# Patient Record
Sex: Male | Born: 1945
Health system: Southern US, Community
[De-identification: ages and names within clinical notes are randomized; demographics above are authoritative.]

## PROBLEM LIST (undated history)

## (undated) DIAGNOSIS — R112 Nausea with vomiting, unspecified: Secondary | ICD-10-CM

## (undated) DIAGNOSIS — Z8489 Family history of other specified conditions: Secondary | ICD-10-CM

## (undated) DIAGNOSIS — I10 Essential (primary) hypertension: Secondary | ICD-10-CM

## (undated) DIAGNOSIS — E785 Hyperlipidemia, unspecified: Secondary | ICD-10-CM

## (undated) DIAGNOSIS — C801 Malignant (primary) neoplasm, unspecified: Secondary | ICD-10-CM

## (undated) DIAGNOSIS — Z9889 Other specified postprocedural states: Secondary | ICD-10-CM

## (undated) DIAGNOSIS — T8859XA Other complications of anesthesia, initial encounter: Secondary | ICD-10-CM

## (undated) DIAGNOSIS — J45909 Unspecified asthma, uncomplicated: Secondary | ICD-10-CM

## (undated) DIAGNOSIS — T4145XA Adverse effect of unspecified anesthetic, initial encounter: Secondary | ICD-10-CM

## (undated) DIAGNOSIS — D649 Anemia, unspecified: Secondary | ICD-10-CM

## (undated) HISTORY — DX: Essential (primary) hypertension: I10

---
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
## 2007-05-05 ENCOUNTER — Ambulatory Visit: Payer: Self-pay | Admitting: General Surgery

## 2009-06-29 HISTORY — PX: APPENDECTOMY: SHX54

## 2009-10-02 ENCOUNTER — Observation Stay: Payer: Self-pay | Admitting: General Surgery

## 2009-10-02 ENCOUNTER — Ambulatory Visit: Payer: Self-pay | Admitting: Internal Medicine

## 2012-11-17 ENCOUNTER — Ambulatory Visit: Payer: Self-pay | Admitting: Internal Medicine

## 2013-04-24 ENCOUNTER — Encounter: Payer: Self-pay | Admitting: *Deleted

## 2013-04-25 ENCOUNTER — Encounter: Payer: Self-pay | Admitting: Podiatry

## 2013-04-25 ENCOUNTER — Ambulatory Visit (INDEPENDENT_AMBULATORY_CARE_PROVIDER_SITE_OTHER): Payer: Medicare Other | Admitting: Podiatry

## 2013-04-25 ENCOUNTER — Ambulatory Visit (INDEPENDENT_AMBULATORY_CARE_PROVIDER_SITE_OTHER): Payer: Medicare Other

## 2013-04-25 VITALS — BP 118/65 | HR 74 | Resp 16 | Ht 75.0 in | Wt 226.0 lb

## 2013-04-25 DIAGNOSIS — M79609 Pain in unspecified limb: Secondary | ICD-10-CM

## 2013-04-25 DIAGNOSIS — M2040 Other hammer toe(s) (acquired), unspecified foot: Secondary | ICD-10-CM

## 2013-04-25 DIAGNOSIS — L84 Corns and callosities: Secondary | ICD-10-CM

## 2013-04-25 DIAGNOSIS — M79672 Pain in left foot: Secondary | ICD-10-CM

## 2013-04-25 NOTE — Progress Notes (Signed)
Subjective:     Patient ID: Glenn Watson, male   DOB: [birth year], 67 y.o.   MRN: [id number]  Toe Pain     Patient presents stating my left second toe at the tip has been very sore and making it hard for me to wear shoes or walk. States has been present for several months and does not remember specific injury   Review of Systems  All other systems reviewed and are negative.       Objective:   Physical Exam  Nursing note and vitals reviewed. Constitutional: He appears well-developed and well-nourished.  Cardiovascular: Intact distal pulses.   Neurological: He is alert.  Skin: Skin is warm.   patient structural has an elongated second toe left with irritation and corn callus formation at the distal lateral tip    Assessment:     Distal keratotic lesion secondary to structural abnormality of the toe    Plan:     H&P and x-ray reviewed with patient. Debridement of lesion accomplished with Band-Aid and buttress pad to reduce pressure against the second toe advised the toe may need to be shortened depending on when symptoms return

## 2013-11-14 DIAGNOSIS — D649 Anemia, unspecified: Secondary | ICD-10-CM | POA: Insufficient documentation

## 2013-11-14 DIAGNOSIS — M1990 Unspecified osteoarthritis, unspecified site: Secondary | ICD-10-CM | POA: Insufficient documentation

## 2015-10-03 DIAGNOSIS — H40003 Preglaucoma, unspecified, bilateral: Secondary | ICD-10-CM | POA: Diagnosis not present

## 2015-10-17 DIAGNOSIS — D485 Neoplasm of uncertain behavior of skin: Secondary | ICD-10-CM | POA: Diagnosis not present

## 2015-10-17 DIAGNOSIS — D045 Carcinoma in situ of skin of trunk: Secondary | ICD-10-CM | POA: Diagnosis not present

## 2015-10-17 DIAGNOSIS — Z85828 Personal history of other malignant neoplasm of skin: Secondary | ICD-10-CM | POA: Diagnosis not present

## 2015-10-17 DIAGNOSIS — C44311 Basal cell carcinoma of skin of nose: Secondary | ICD-10-CM | POA: Diagnosis not present

## 2015-10-17 DIAGNOSIS — Z08 Encounter for follow-up examination after completed treatment for malignant neoplasm: Secondary | ICD-10-CM | POA: Diagnosis not present

## 2015-10-17 DIAGNOSIS — X32XXXA Exposure to sunlight, initial encounter: Secondary | ICD-10-CM | POA: Diagnosis not present

## 2015-10-17 DIAGNOSIS — C44719 Basal cell carcinoma of skin of left lower limb, including hip: Secondary | ICD-10-CM | POA: Diagnosis not present

## 2015-10-17 DIAGNOSIS — L570 Actinic keratosis: Secondary | ICD-10-CM | POA: Diagnosis not present

## 2015-11-14 DIAGNOSIS — E7800 Pure hypercholesterolemia, unspecified: Secondary | ICD-10-CM | POA: Diagnosis not present

## 2015-11-14 DIAGNOSIS — I10 Essential (primary) hypertension: Secondary | ICD-10-CM | POA: Diagnosis not present

## 2015-11-14 DIAGNOSIS — Z125 Encounter for screening for malignant neoplasm of prostate: Secondary | ICD-10-CM | POA: Diagnosis not present

## 2015-11-14 DIAGNOSIS — Z79899 Other long term (current) drug therapy: Secondary | ICD-10-CM | POA: Diagnosis not present

## 2015-11-21 DIAGNOSIS — I10 Essential (primary) hypertension: Secondary | ICD-10-CM | POA: Diagnosis not present

## 2015-11-21 DIAGNOSIS — M1990 Unspecified osteoarthritis, unspecified site: Secondary | ICD-10-CM | POA: Diagnosis not present

## 2015-11-21 DIAGNOSIS — Z125 Encounter for screening for malignant neoplasm of prostate: Secondary | ICD-10-CM | POA: Diagnosis not present

## 2015-11-21 DIAGNOSIS — Z79899 Other long term (current) drug therapy: Secondary | ICD-10-CM | POA: Diagnosis not present

## 2015-11-21 DIAGNOSIS — E7800 Pure hypercholesterolemia, unspecified: Secondary | ICD-10-CM | POA: Diagnosis not present

## 2015-11-21 DIAGNOSIS — Z0000 Encounter for general adult medical examination without abnormal findings: Secondary | ICD-10-CM | POA: Diagnosis not present

## 2015-11-21 DIAGNOSIS — D649 Anemia, unspecified: Secondary | ICD-10-CM | POA: Diagnosis not present

## 2015-11-28 DIAGNOSIS — Z85828 Personal history of other malignant neoplasm of skin: Secondary | ICD-10-CM | POA: Diagnosis not present

## 2015-11-28 DIAGNOSIS — C44311 Basal cell carcinoma of skin of nose: Secondary | ICD-10-CM | POA: Diagnosis not present

## 2015-11-29 DIAGNOSIS — L905 Scar conditions and fibrosis of skin: Secondary | ICD-10-CM | POA: Diagnosis not present

## 2015-11-29 DIAGNOSIS — C44719 Basal cell carcinoma of skin of left lower limb, including hip: Secondary | ICD-10-CM | POA: Diagnosis not present

## 2015-12-13 DIAGNOSIS — D044 Carcinoma in situ of skin of scalp and neck: Secondary | ICD-10-CM | POA: Diagnosis not present

## 2016-03-26 DIAGNOSIS — Z125 Encounter for screening for malignant neoplasm of prostate: Secondary | ICD-10-CM | POA: Diagnosis not present

## 2016-03-26 DIAGNOSIS — Z79899 Other long term (current) drug therapy: Secondary | ICD-10-CM | POA: Diagnosis not present

## 2016-04-22 DIAGNOSIS — D485 Neoplasm of uncertain behavior of skin: Secondary | ICD-10-CM | POA: Diagnosis not present

## 2016-04-22 DIAGNOSIS — D2339 Other benign neoplasm of skin of other parts of face: Secondary | ICD-10-CM | POA: Diagnosis not present

## 2016-04-22 DIAGNOSIS — C44729 Squamous cell carcinoma of skin of left lower limb, including hip: Secondary | ICD-10-CM | POA: Diagnosis not present

## 2016-04-22 DIAGNOSIS — Z85828 Personal history of other malignant neoplasm of skin: Secondary | ICD-10-CM | POA: Diagnosis not present

## 2016-04-24 ENCOUNTER — Other Ambulatory Visit: Payer: Self-pay | Admitting: Internal Medicine

## 2016-04-24 DIAGNOSIS — M1732 Unilateral post-traumatic osteoarthritis, left knee: Secondary | ICD-10-CM | POA: Diagnosis not present

## 2016-04-24 DIAGNOSIS — M79605 Pain in left leg: Secondary | ICD-10-CM | POA: Diagnosis not present

## 2016-05-18 ENCOUNTER — Other Ambulatory Visit: Payer: Self-pay | Admitting: Internal Medicine

## 2016-05-18 DIAGNOSIS — M1732 Unilateral post-traumatic osteoarthritis, left knee: Secondary | ICD-10-CM

## 2016-05-18 DIAGNOSIS — M79605 Pain in left leg: Secondary | ICD-10-CM

## 2016-05-19 ENCOUNTER — Ambulatory Visit
Admission: RE | Admit: 2016-05-19 | Discharge: 2016-05-19 | Disposition: A | Discharge status: Home / Self Care | Payer: PPO | Source: Ambulatory Visit | Attending: Internal Medicine | Admitting: Internal Medicine

## 2016-05-19 DIAGNOSIS — M948X6 Other specified disorders of cartilage, lower leg: Secondary | ICD-10-CM | POA: Diagnosis not present

## 2016-05-19 DIAGNOSIS — M79605 Pain in left leg: Secondary | ICD-10-CM | POA: Diagnosis not present

## 2016-05-19 DIAGNOSIS — R937 Abnormal findings on diagnostic imaging of other parts of musculoskeletal system: Secondary | ICD-10-CM | POA: Insufficient documentation

## 2016-05-19 DIAGNOSIS — M1732 Unilateral post-traumatic osteoarthritis, left knee: Secondary | ICD-10-CM | POA: Insufficient documentation

## 2016-05-19 DIAGNOSIS — M25562 Pain in left knee: Secondary | ICD-10-CM | POA: Diagnosis not present

## 2016-05-29 DIAGNOSIS — C44729 Squamous cell carcinoma of skin of left lower limb, including hip: Secondary | ICD-10-CM | POA: Diagnosis not present

## 2016-07-07 DIAGNOSIS — G5732 Lesion of lateral popliteal nerve, left lower limb: Secondary | ICD-10-CM | POA: Diagnosis not present

## 2016-07-07 DIAGNOSIS — M2392 Unspecified internal derangement of left knee: Secondary | ICD-10-CM | POA: Diagnosis not present

## 2016-07-07 DIAGNOSIS — M1712 Unilateral primary osteoarthritis, left knee: Secondary | ICD-10-CM | POA: Diagnosis not present

## 2016-11-27 DIAGNOSIS — Z79899 Other long term (current) drug therapy: Secondary | ICD-10-CM | POA: Diagnosis not present

## 2016-11-27 DIAGNOSIS — E7800 Pure hypercholesterolemia, unspecified: Secondary | ICD-10-CM | POA: Diagnosis not present

## 2016-11-27 DIAGNOSIS — Z125 Encounter for screening for malignant neoplasm of prostate: Secondary | ICD-10-CM | POA: Diagnosis not present

## 2016-12-03 DIAGNOSIS — I10 Essential (primary) hypertension: Secondary | ICD-10-CM | POA: Diagnosis not present

## 2016-12-03 DIAGNOSIS — D649 Anemia, unspecified: Secondary | ICD-10-CM | POA: Diagnosis not present

## 2016-12-03 DIAGNOSIS — E7800 Pure hypercholesterolemia, unspecified: Secondary | ICD-10-CM | POA: Diagnosis not present

## 2016-12-03 DIAGNOSIS — Z1211 Encounter for screening for malignant neoplasm of colon: Secondary | ICD-10-CM | POA: Diagnosis not present

## 2016-12-03 DIAGNOSIS — R42 Dizziness and giddiness: Secondary | ICD-10-CM | POA: Diagnosis not present

## 2016-12-03 DIAGNOSIS — Z79899 Other long term (current) drug therapy: Secondary | ICD-10-CM | POA: Diagnosis not present

## 2016-12-03 DIAGNOSIS — Z125 Encounter for screening for malignant neoplasm of prostate: Secondary | ICD-10-CM | POA: Diagnosis not present

## 2016-12-03 DIAGNOSIS — Z0000 Encounter for general adult medical examination without abnormal findings: Secondary | ICD-10-CM | POA: Diagnosis not present

## 2016-12-07 ENCOUNTER — Encounter: Payer: Self-pay | Admitting: *Deleted

## 2016-12-14 ENCOUNTER — Encounter: Payer: Self-pay | Admitting: General Surgery

## 2016-12-14 ENCOUNTER — Telehealth: Payer: Self-pay | Admitting: *Deleted

## 2016-12-14 ENCOUNTER — Ambulatory Visit (INDEPENDENT_AMBULATORY_CARE_PROVIDER_SITE_OTHER): Payer: PPO | Admitting: General Surgery

## 2016-12-14 VITALS — BP 124/70 | HR 67 | Resp 13 | Ht 75.0 in | Wt 230.0 lb

## 2016-12-14 DIAGNOSIS — Z1211 Encounter for screening for malignant neoplasm of colon: Secondary | ICD-10-CM

## 2016-12-14 MED ORDER — POLYETHYLENE GLYCOL 3350 17 GM/SCOOP PO POWD: ORAL | 0 refills | Status: DC

## 2016-12-14 NOTE — Patient Instructions (Signed)
Colonoscopy, Adult A colonoscopy is an exam to look at the entire large intestine. During the exam, a lubricated, bendable tube is inserted into the anus and then passed into the rectum, colon, and other parts of the large intestine. A colonoscopy is often done as a part of normal colorectal screening or in response to certain symptoms, such as anemia, persistent diarrhea, abdominal pain, and blood in the stool. The exam can help screen for and diagnose medical problems, including:  Tumors.  Polyps.  Inflammation.  Areas of bleeding.  Tell a health care provider about:  Any allergies you have.  All medicines you are taking, including vitamins, herbs, eye drops, creams, and over-the-counter medicines.  Any problems you or family members have had with anesthetic medicines.  Any blood disorders you have.  Any surgeries you have had.  Any medical conditions you have.  Any problems you have had passing stool. What are the risks? Generally, this is a safe procedure. However, problems may occur, including:  Bleeding.  A tear in the intestine.  A reaction to medicines given during the exam.  Infection (rare).  What happens before the procedure? Eating and drinking restrictions Follow instructions from your health care provider about eating and drinking, which may include:  A few days before the procedure - follow a low-fiber diet. Avoid nuts, seeds, dried fruit, raw fruits, and vegetables.  1-3 days before the procedure - follow a clear liquid diet. Drink only clear liquids, such as clear broth or bouillon, black coffee or tea, clear juice, clear soft drinks or sports drinks, gelatin dessert, and popsicles. Avoid any liquids that contain red or purple dye.  On the day of the procedure - do not eat or drink anything during the 2 hours before the procedure, or within the time period that your health care provider recommends.  Bowel prep If you were prescribed an oral bowel prep  to clean out your colon:  Take it as told by your health care provider. Starting the day before your procedure, you will need to drink a large amount of medicated liquid. The liquid will cause you to have multiple loose stools until your stool is almost clear or light green.  If your skin or anus gets irritated from diarrhea, you may use these to relieve the irritation: ? Medicated wipes, such as adult wet wipes with aloe and vitamin E. ? A skin soothing-product like petroleum jelly.  If you vomit while drinking the bowel prep, take a break for up to 60 minutes and then begin the bowel prep again. If vomiting continues and you cannot take the bowel prep without vomiting, call your health care provider.  General instructions  Ask your health care provider about changing or stopping your regular medicines. This is especially important if you are taking diabetes medicines or blood thinners.  Plan to have someone take you home from the hospital or clinic. What happens during the procedure?  An IV tube may be inserted into one of your veins.  You will be given medicine to help you relax (sedative).  To reduce your risk of infection: ? Your health care team will wash or sanitize their hands. ? Your anal area will be washed with soap.  You will be asked to lie on your side with your knees bent.  Your health care provider will lubricate a long, thin, flexible tube. The tube will have a camera and a light on the end.  The tube will be inserted into your   anus.  The tube will be gently eased through your rectum and colon.  Air will be delivered into your colon to keep it open. You may feel some pressure or cramping.  The camera will be used to take images during the procedure.  A small tissue sample may be removed from your body to be examined under a microscope (biopsy). If any potential problems are found, the tissue will be sent to a lab for testing.  If small polyps are found, your  health care provider may remove them and have them checked for cancer cells.  The tube that was inserted into your anus will be slowly removed. The procedure may vary among health care providers and hospitals. What happens after the procedure?  Your blood pressure, heart rate, breathing rate, and blood oxygen level will be monitored until the medicines you were given have worn off.  Do not drive for 24 hours after the exam.  You may have a small amount of blood in your stool.  You may pass gas and have mild abdominal cramping or bloating due to the air that was used to inflate your colon during the exam.  It is up to you to get the results of your procedure. Ask your health care provider, or the department performing the procedure, when your results will be ready. This information is not intended to replace advice given to you by your health care provider. Make sure you discuss any questions you have with your health care provider. Document Released: 06/12/2000 Document Revised: 04/15/2016 Document Reviewed: 08/27/2015 Elsevier Interactive Patient Education  2018 [employer]  

## 2016-12-14 NOTE — Telephone Encounter (Signed)
Patient called the office back and wants to schedule colonoscopy for 02-10-17 at [facility] with Dr. Jamal Collin.   This patient is aware to call the office if they have further questions.

## 2016-12-14 NOTE — Progress Notes (Signed)
Patient ID: Glenn Watson, male   DOB: [birth year], 71 y.o.   MRN: [id number]  Chief Complaint  Patient presents with  . Colonoscopy    HPI Glenn Watson is a 71 y.o. male is here today for an evaluation of a colonoscopy. Last one was done in 2008. Patient states no gastrointestinal issues. Moves bowels daily. HPI  Past Medical History:  Diagnosis Date  . Hypertension     Past Surgical History:  Procedure Laterality Date  . APPENDECTOMY  2011   Dr. Bary Castilla    No family history on file.  Social History Social History  Substance Use Topics  . Smoking status: Never Smoker  . Smokeless tobacco: Never Used  . Alcohol use Yes     Comment: BEER EACH DAY    No Known Allergies  Current Outpatient Prescriptions  Medication Sig Dispense Refill  . amLODipine-benazepril (LOTREL) 5-10 MG per capsule Take 1 capsule by mouth daily.    Marland Kitchen aspirin 81 MG tablet Take 81 mg by mouth daily.    . Calcium Carb-Cholecalciferol (CALCIUM 600 + D PO) Take by mouth daily.    . Cholecalciferol (VITAMIN D) 2000 UNITS CAPS Take by mouth daily.    . Ferrous Sulfate (IRON) 325 (65 FE) MG TABS Take by mouth daily.    . fish oil-omega-3 fatty acids 1000 MG capsule Take 2 g by mouth daily.    . meloxicam (MOBIC) 15 MG tablet Take 15 mg by mouth daily.    . Multiple Vitamin (MULTIVITAMIN) tablet Take 1 tablet by mouth daily.    . polyethylene glycol powder (GLYCOLAX/MIRALAX) powder 255 grams one bottle for colonoscopy prep 255 g 0   No current facility-administered medications for this visit.     Review of Systems Review of Systems  Constitutional: Negative.   Respiratory: Negative.   Cardiovascular: Negative.   Gastrointestinal: Negative.     Blood pressure 124/70, pulse 67, resp. rate 13, height 6\' 3"  (1.905 m), weight 230 lb (104.3 kg).  Physical Exam Physical Exam  Constitutional: He is oriented to person, place, and time. He appears well-developed and well-nourished.  Eyes:  Conjunctivae are normal. No scleral icterus.  Neck: Neck supple.  Cardiovascular: Normal rate, regular rhythm and normal heart sounds.   Pulmonary/Chest: Effort normal and breath sounds normal.  Abdominal: Soft. Normal appearance and bowel sounds are normal.  Lymphadenopathy:    He has no cervical adenopathy.  Neurological: He is alert and oriented to person, place, and time.  Skin: Skin is warm and dry.  Psychiatric: He has a normal mood and affect. His behavior is normal.    Data Reviewed Prior notes reviewed.  Assessment    Colonoscopy screening due. Left torn meniscus- takes Mobic once or twice a week, following with Dr. Greggory Keen exam otherwise    Plan    Colonoscopy to be scheduled in July, patient said he will figure out his schedule and call office back.     Colonoscopy with possible biopsy/polypectomy prn: Information regarding the procedure, including its potential risks and complications (including but not limited to perforation of the bowel, which may require emergency surgery to repair, and bleeding) was verbally given to the patient. Educational information regarding lower intestinal endoscopy was given to the patient. Written instructions for how to complete the bowel prep using Miralax were provided. The importance of drinking ample fluids to avoid dehydration as a result of the prep emphasized.  HPI, Physical Exam, Assessment and Plan have been scribed under the  direction and in the presence of Mckinley Jewel, MD.  Verlene Mayer, CMA I have completed the exam and reviewed the above documentation for accuracy and completeness.  I agree with the above.  Haematologist has been used and any errors in dictation or transcription are unintentional.  Seeplaputhur G. Jamal Collin, M.D., F.A.C.S.   Junie Panning G 12/14/2016, 11:00 AM   Miralax prescription has been sent in to the patient's pharmacy today. Colonoscopy instructions have been reviewed with the patient.  He wishes to check with wife regarding a date and will call the office back to arrange.   Dominga Ferry, CMA

## 2016-12-15 ENCOUNTER — Encounter: Payer: Self-pay | Admitting: General Surgery

## 2016-12-24 ENCOUNTER — Ambulatory Visit: Payer: Self-pay | Admitting: General Surgery

## 2017-01-11 DIAGNOSIS — I6523 Occlusion and stenosis of bilateral carotid arteries: Secondary | ICD-10-CM | POA: Diagnosis not present

## 2017-01-11 DIAGNOSIS — R42 Dizziness and giddiness: Secondary | ICD-10-CM | POA: Diagnosis not present

## 2017-02-02 ENCOUNTER — Other Ambulatory Visit: Payer: Self-pay | Admitting: General Surgery

## 2017-02-02 DIAGNOSIS — Z1211 Encounter for screening for malignant neoplasm of colon: Secondary | ICD-10-CM

## 2017-02-10 ENCOUNTER — Ambulatory Visit
Admission: RE | Admit: 2017-02-10 | Discharge: 2017-02-10 | Disposition: A | Discharge status: Home / Self Care | Payer: PPO | Source: Ambulatory Visit | Attending: General Surgery | Admitting: General Surgery

## 2017-02-10 ENCOUNTER — Encounter: Payer: Self-pay | Admitting: Anesthesiology

## 2017-02-10 ENCOUNTER — Ambulatory Visit: Payer: PPO | Admitting: Anesthesiology

## 2017-02-10 ENCOUNTER — Encounter
Admission: RE | Disposition: A | Discharge status: Home / Self Care | Payer: Self-pay | Source: Ambulatory Visit | Attending: General Surgery

## 2017-02-10 DIAGNOSIS — I10 Essential (primary) hypertension: Secondary | ICD-10-CM | POA: Diagnosis not present

## 2017-02-10 DIAGNOSIS — Z7982 Long term (current) use of aspirin: Secondary | ICD-10-CM | POA: Diagnosis not present

## 2017-02-10 DIAGNOSIS — K5730 Diverticulosis of large intestine without perforation or abscess without bleeding: Secondary | ICD-10-CM | POA: Insufficient documentation

## 2017-02-10 DIAGNOSIS — Z1211 Encounter for screening for malignant neoplasm of colon: Secondary | ICD-10-CM | POA: Diagnosis not present

## 2017-02-10 DIAGNOSIS — Z79899 Other long term (current) drug therapy: Secondary | ICD-10-CM | POA: Insufficient documentation

## 2017-02-10 DIAGNOSIS — K5790 Diverticulosis of intestine, part unspecified, without perforation or abscess without bleeding: Secondary | ICD-10-CM | POA: Diagnosis not present

## 2017-02-10 HISTORY — PX: COLONOSCOPY WITH PROPOFOL: SHX5780

## 2017-02-10 SURGERY — COLONOSCOPY WITH PROPOFOL
Anesthesia: General

## 2017-02-10 MED ORDER — MIDAZOLAM HCL 2 MG/2ML IJ SOLN
INTRAMUSCULAR | Status: AC
  Filled 2017-02-10: qty 2

## 2017-02-10 MED ORDER — FENTANYL CITRATE (PF) 100 MCG/2ML IJ SOLN
INTRAMUSCULAR | Status: AC
  Filled 2017-02-10: qty 2

## 2017-02-10 MED ORDER — PROPOFOL 10 MG/ML IV BOLUS
INTRAVENOUS | Status: AC
  Filled 2017-02-10: qty 20

## 2017-02-10 MED ORDER — MIDAZOLAM HCL 2 MG/2ML IJ SOLN
INTRAMUSCULAR | Status: DC | PRN
  Administered 2017-02-10: 2 mg via INTRAVENOUS

## 2017-02-10 MED ORDER — SODIUM CHLORIDE 0.9 % IV SOLN
INTRAVENOUS | Status: DC
  Administered 2017-02-10: 1000 mL via INTRAVENOUS

## 2017-02-10 MED ORDER — EPHEDRINE SULFATE 50 MG/ML IJ SOLN
INTRAMUSCULAR | Status: AC
  Filled 2017-02-10: qty 1

## 2017-02-10 MED ORDER — EPHEDRINE SULFATE 50 MG/ML IJ SOLN
INTRAMUSCULAR | Status: DC | PRN
  Administered 2017-02-10 (×2): 10 mg via INTRAVENOUS

## 2017-02-10 MED ORDER — PROPOFOL 500 MG/50ML IV EMUL
INTRAVENOUS | Status: AC
  Filled 2017-02-10: qty 50

## 2017-02-10 MED ORDER — PROPOFOL 500 MG/50ML IV EMUL
INTRAVENOUS | Status: DC | PRN
  Administered 2017-02-10: 120 ug/kg/min via INTRAVENOUS

## 2017-02-10 MED ORDER — FENTANYL CITRATE (PF) 100 MCG/2ML IJ SOLN
INTRAMUSCULAR | Status: DC | PRN
  Administered 2017-02-10: 50 ug via INTRAVENOUS
  Administered 2017-02-10 (×2): 25 ug via INTRAVENOUS

## 2017-02-10 NOTE — Interval H&P Note (Signed)
History and Physical Interval Note:  02/10/2017 7:53 AM  Glenn Watson  has presented today for surgery, with the diagnosis of SCREENING  The various methods of treatment have been discussed with the patient and family. After consideration of risks, benefits and other options for treatment, the patient has consented to  Procedure(s): COLONOSCOPY WITH PROPOFOL (N/A) as a surgical intervention .  The patient's history has been reviewed, patient examined, no change in status, stable for surgery.  I have reviewed the patient's chart and labs.  Questions were answered to the patient's satisfaction.     Demoni Parmar G

## 2017-02-10 NOTE — Anesthesia Preprocedure Evaluation (Signed)
Anesthesia Evaluation  Patient identified by MRN, date of birth, ID band Patient awake    Reviewed: Allergy & Precautions, NPO status , Patient's Chart, lab work & pertinent test results  History of Anesthesia Complications Negative for: history of anesthetic complications  Airway Mallampati: II       Dental   Pulmonary asthma (as a child) , neg COPD,           Cardiovascular hypertension, Pt. on medications (-) Past MI and (-) CHF (-) dysrhythmias (-) Valvular Problems/Murmurs     Neuro/Psych neg Seizures    GI/Hepatic Neg liver ROS, neg GERD  ,  Endo/Other  neg diabetes  Renal/GU negative Renal ROS     Musculoskeletal   Abdominal   Peds  Hematology   Anesthesia Other Findings   Reproductive/Obstetrics                             Anesthesia Physical Anesthesia Plan  ASA: II  Anesthesia Plan: General   Post-op Pain Management:    Induction: Intravenous  PONV Risk Score and Plan:   Airway Management Planned:   Additional Equipment:   Intra-op Plan:   Post-operative Plan:   Informed Consent: I have reviewed the patients History and Physical, chart, labs and discussed the procedure including the risks, benefits and alternatives for the proposed anesthesia with the patient or authorized representative who has indicated his/her understanding and acceptance.     Plan Discussed with:   Anesthesia Plan Comments:         Anesthesia Quick Evaluation

## 2017-02-10 NOTE — Anesthesia Procedure Notes (Signed)
Performed by: COOK-MARTIN, Savannha Welle Pre-anesthesia Checklist: Patient identified, Emergency Drugs available, Suction available, Patient being monitored and Timeout performed Patient Re-evaluated:Patient Re-evaluated prior to induction Oxygen Delivery Method: Nasal cannula Preoxygenation: Pre-oxygenation with 100% oxygen Induction Type: IV induction Placement Confirmation: positive ETCO2 and CO2 detector       

## 2017-02-10 NOTE — Transfer of Care (Signed)
Immediate Anesthesia Transfer of Care Note  Patient: Glenn Watson  Procedure(s) Performed: Procedure(s): COLONOSCOPY WITH PROPOFOL (N/A)  Patient Location: PACU  Anesthesia Type:General  Level of Consciousness: awake and sedated  Airway & Oxygen Therapy: Patient Spontanous Breathing and Patient connected to nasal cannula oxygen  Post-op Assessment: Report given to RN and Post -op Vital signs reviewed and stable  Post vital signs: Reviewed and stable  Last Vitals:  Vitals:   02/10/17 0729  BP: (!) 150/81  Pulse: 66  Resp: 18  Temp: 37.3 C  SpO2: 98%    Last Pain:  Vitals:   02/10/17 0729  TempSrc: Tympanic         Complications: No apparent anesthesia complications

## 2017-02-10 NOTE — Anesthesia Postprocedure Evaluation (Signed)
Anesthesia Post Note  Patient: Glenn Watson  Procedure(s) Performed: Procedure(s) (LRB): COLONOSCOPY WITH PROPOFOL (N/A)  Patient location during evaluation: Endoscopy Anesthesia Type: General Level of consciousness: awake and alert Pain management: pain level controlled Vital Signs Assessment: post-procedure vital signs reviewed and stable Respiratory status: spontaneous breathing and respiratory function stable Cardiovascular status: stable Anesthetic complications: no     Last Vitals:  Vitals:   02/10/17 0729 02/10/17 0850  BP: (!) 150/81 (!) 95/51  Pulse: 66 66  Resp: 18 18  Temp: 37.3 C (!) 35.8 C  SpO2: 98% 98%    Last Pain:  Vitals:   02/10/17 0850  TempSrc: Tympanic                 KEPHART,WILLIAM K

## 2017-02-10 NOTE — Op Note (Signed)
[facility] Gastroenterology Patient Name: Glenn Watson Procedure Date: 02/10/2017 7:11 AM MRN: [id number] Account #: [id number] Date of Birth: Mar 23, 1946 Admit Type: Outpatient Age: 71 Room: [facility] ENDO ROOM 1 Gender: Male Note Status: Finalized Procedure:            Colonoscopy Indications:          Screening for colorectal malignant neoplasm Providers:            Mitsuye Schrodt G. Jamal Collin, MD Referring MD:         Leonie Douglas. Doy Hutching, MD (Referring MD) Medicines:            Total IV Anesthesia (TIVA) Complications:        No immediate complications. Procedure:            Pre-Anesthesia Assessment:                       - General anesthesia under the supervision of an                        anesthesiologist was determined to be medically                        necessary for this procedure based on review of the                        patient's medical history, medications, and prior                        anesthesia history.                       After obtaining informed consent, the colonoscope was                        passed under direct vision. Throughout the procedure,                        the patient's blood pressure, pulse, and oxygen                        saturations were monitored continuously. The                        Colonoscope was introduced through the anus and                        advanced to the the cecum, identified by the ileocecal                        valve. The colonoscopy was performed without                        difficulty. The patient tolerated the procedure well.                        The quality of the bowel preparation was excellent. Findings:      The perianal and digital rectal examinations were normal.      A few small-mouthed diverticula were found in the sigmoid colon.      The exam was otherwise without abnormality. Impression:           -  Diverticulosis in the sigmoid colon.                       - The examination was  otherwise normal.                       - No specimens collected. Recommendation:       - Discharge patient to home.                       - Resume previous diet.                       - Continue present medications.                       - Repeat colonoscopy in 10 years for screening purposes. Procedure Code(s):    --- Professional ---                       [id number], Colonoscopy, flexible; diagnostic, including                        collection of specimen(s) by brushing or washing, when                        performed (separate procedure) Diagnosis Code(s):    --- Professional ---                       Z12.11, Encounter for screening for malignant neoplasm                        of colon                       K57.30, Diverticulosis of large intestine without                        perforation or abscess without bleeding CPT copyright 2016 American [employer]. All rights reserved. The codes documented in this report are preliminary and upon coder review may  be revised to meet current compliance requirements. Christene Lye, MD 02/10/2017 8:49:42 AM This report has been signed electronically. Number of Addenda: 0 Note Initiated On: 02/10/2017 7:11 AM Scope Withdrawal Time: 0 hours 3 minutes 37 seconds  Total Procedure Duration: 0 hours 46 minutes 13 seconds       [facility]

## 2017-02-10 NOTE — H&P (Signed)
Glenn Watson is an 71 y.o. male.   Chief Complaint:Here for colonoscopy HPI: 71 yr old male in good health here for screening colonoscopy. Last done 10 yrs ago-normal. No GI complaints  Past Medical History:  Diagnosis Date  . Hypertension     Past Surgical History:  Procedure Laterality Date  . APPENDECTOMY  2011   Dr. Bary Castilla    History reviewed. No pertinent family history. Social History:  reports that he has never smoked. He has never used smokeless tobacco. He reports that he drinks alcohol. He reports that he does not use drugs.  Allergies: No Known Allergies  Medications Prior to Admission  Medication Sig Dispense Refill  . amLODipine-benazepril (LOTREL) 5-10 MG per capsule Take 1 capsule by mouth daily.    Marland Kitchen aspirin 81 MG tablet Take 81 mg by mouth daily.    . Calcium Carb-Cholecalciferol (CALCIUM 600 + D PO) Take by mouth daily.    . Cholecalciferol (VITAMIN D) 2000 UNITS CAPS Take by mouth daily.    . Ferrous Sulfate (IRON) 325 (65 FE) MG TABS Take by mouth daily.    . fish oil-omega-3 fatty acids 1000 MG capsule Take 2 g by mouth daily.    . meloxicam (MOBIC) 15 MG tablet Take 15 mg by mouth daily.    . Multiple Vitamin (MULTIVITAMIN) tablet Take 1 tablet by mouth daily.    . polyethylene glycol powder (GLYCOLAX/MIRALAX) powder 255 grams one bottle for colonoscopy prep 255 g 0    No results found for this or any previous visit (from the past 2 hour(s)). No results found.  Review of Systems  Constitutional: Negative.   Respiratory: Negative.   Cardiovascular: Negative.   Gastrointestinal: Negative.   Genitourinary: Negative.     Blood pressure (!) 150/81, pulse 66, temperature 99.2 F (37.3 C), temperature source Tympanic, resp. rate 18, height 6\' 3"  (1.905 m), weight 229 lb (103.9 kg), SpO2 98 %. Physical Exam  Constitutional: He is oriented to person, place, and time. He appears well-developed and well-nourished.  Eyes: Conjunctivae are normal. No  scleral icterus.  Neck: Neck supple.  Cardiovascular: Normal rate, regular rhythm and normal heart sounds.   Respiratory: Effort normal and breath sounds normal.  GI: Soft. Bowel sounds are normal. He exhibits no distension and no mass. There is no hepatomegaly. There is no tenderness. No hernia.  Lymphadenopathy:    He has no cervical adenopathy.  Neurological: He is alert and oriented to person, place, and time.  Skin: Skin is warm and dry.     Assessment/Plan Ok to proceed with colonoscopy as planned  Christene Lye, MD 02/10/2017, 7:49 AM

## 2017-02-10 NOTE — Anesthesia Post-op Follow-up Note (Signed)
Anesthesia QCDR form completed.        

## 2017-02-11 ENCOUNTER — Encounter: Payer: Self-pay | Admitting: General Surgery

## 2017-03-02 ENCOUNTER — Encounter: Payer: Self-pay | Admitting: General Surgery

## 2017-04-22 DIAGNOSIS — D0461 Carcinoma in situ of skin of right upper limb, including shoulder: Secondary | ICD-10-CM | POA: Diagnosis not present

## 2017-04-22 DIAGNOSIS — Z08 Encounter for follow-up examination after completed treatment for malignant neoplasm: Secondary | ICD-10-CM | POA: Diagnosis not present

## 2017-04-22 DIAGNOSIS — D485 Neoplasm of uncertain behavior of skin: Secondary | ICD-10-CM | POA: Diagnosis not present

## 2017-04-22 DIAGNOSIS — C44311 Basal cell carcinoma of skin of nose: Secondary | ICD-10-CM | POA: Diagnosis not present

## 2017-04-22 DIAGNOSIS — L570 Actinic keratosis: Secondary | ICD-10-CM | POA: Diagnosis not present

## 2017-04-22 DIAGNOSIS — Z85828 Personal history of other malignant neoplasm of skin: Secondary | ICD-10-CM | POA: Diagnosis not present

## 2017-04-22 DIAGNOSIS — X32XXXA Exposure to sunlight, initial encounter: Secondary | ICD-10-CM | POA: Diagnosis not present

## 2017-04-29 DIAGNOSIS — R5381 Other malaise: Secondary | ICD-10-CM | POA: Diagnosis not present

## 2017-04-29 DIAGNOSIS — Z125 Encounter for screening for malignant neoplasm of prostate: Secondary | ICD-10-CM | POA: Diagnosis not present

## 2017-04-29 DIAGNOSIS — R42 Dizziness and giddiness: Secondary | ICD-10-CM | POA: Diagnosis not present

## 2017-04-29 DIAGNOSIS — R7309 Other abnormal glucose: Secondary | ICD-10-CM | POA: Diagnosis not present

## 2017-04-29 DIAGNOSIS — Z79899 Other long term (current) drug therapy: Secondary | ICD-10-CM | POA: Diagnosis not present

## 2017-05-10 DIAGNOSIS — R42 Dizziness and giddiness: Secondary | ICD-10-CM | POA: Diagnosis not present

## 2017-05-13 DIAGNOSIS — C44311 Basal cell carcinoma of skin of nose: Secondary | ICD-10-CM | POA: Diagnosis not present

## 2017-05-13 DIAGNOSIS — Z85828 Personal history of other malignant neoplasm of skin: Secondary | ICD-10-CM | POA: Diagnosis not present

## 2017-05-26 DIAGNOSIS — D045 Carcinoma in situ of skin of trunk: Secondary | ICD-10-CM | POA: Diagnosis not present

## 2017-05-27 DIAGNOSIS — D0461 Carcinoma in situ of skin of right upper limb, including shoulder: Secondary | ICD-10-CM | POA: Diagnosis not present

## 2017-10-27 DIAGNOSIS — D485 Neoplasm of uncertain behavior of skin: Secondary | ICD-10-CM | POA: Diagnosis not present

## 2017-10-27 DIAGNOSIS — Z08 Encounter for follow-up examination after completed treatment for malignant neoplasm: Secondary | ICD-10-CM | POA: Diagnosis not present

## 2017-10-27 DIAGNOSIS — Z85828 Personal history of other malignant neoplasm of skin: Secondary | ICD-10-CM | POA: Diagnosis not present

## 2017-10-28 DIAGNOSIS — C44519 Basal cell carcinoma of skin of other part of trunk: Secondary | ICD-10-CM | POA: Diagnosis not present

## 2017-10-28 DIAGNOSIS — C44729 Squamous cell carcinoma of skin of left lower limb, including hip: Secondary | ICD-10-CM | POA: Diagnosis not present

## 2017-12-02 DIAGNOSIS — C44519 Basal cell carcinoma of skin of other part of trunk: Secondary | ICD-10-CM | POA: Diagnosis not present

## 2017-12-02 DIAGNOSIS — H40003 Preglaucoma, unspecified, bilateral: Secondary | ICD-10-CM | POA: Diagnosis not present

## 2017-12-02 DIAGNOSIS — C44729 Squamous cell carcinoma of skin of left lower limb, including hip: Secondary | ICD-10-CM | POA: Diagnosis not present

## 2018-02-11 DIAGNOSIS — Z79899 Other long term (current) drug therapy: Secondary | ICD-10-CM | POA: Diagnosis not present

## 2018-02-11 DIAGNOSIS — R7309 Other abnormal glucose: Secondary | ICD-10-CM | POA: Diagnosis not present

## 2018-02-11 DIAGNOSIS — E7800 Pure hypercholesterolemia, unspecified: Secondary | ICD-10-CM | POA: Diagnosis not present

## 2018-02-11 DIAGNOSIS — Z125 Encounter for screening for malignant neoplasm of prostate: Secondary | ICD-10-CM | POA: Diagnosis not present

## 2018-02-18 DIAGNOSIS — E7800 Pure hypercholesterolemia, unspecified: Secondary | ICD-10-CM | POA: Diagnosis not present

## 2018-02-18 DIAGNOSIS — Z0000 Encounter for general adult medical examination without abnormal findings: Secondary | ICD-10-CM | POA: Diagnosis not present

## 2018-02-18 DIAGNOSIS — I10 Essential (primary) hypertension: Secondary | ICD-10-CM | POA: Diagnosis not present

## 2018-02-18 DIAGNOSIS — D649 Anemia, unspecified: Secondary | ICD-10-CM | POA: Diagnosis not present

## 2018-02-18 DIAGNOSIS — R972 Elevated prostate specific antigen [PSA]: Secondary | ICD-10-CM | POA: Diagnosis not present

## 2018-03-24 DIAGNOSIS — R972 Elevated prostate specific antigen [PSA]: Secondary | ICD-10-CM | POA: Diagnosis not present

## 2018-04-05 DIAGNOSIS — M79671 Pain in right foot: Secondary | ICD-10-CM | POA: Diagnosis not present

## 2018-04-05 DIAGNOSIS — G8929 Other chronic pain: Secondary | ICD-10-CM | POA: Diagnosis not present

## 2018-04-05 DIAGNOSIS — M7751 Other enthesopathy of right foot: Secondary | ICD-10-CM | POA: Diagnosis not present

## 2018-05-04 DIAGNOSIS — Z08 Encounter for follow-up examination after completed treatment for malignant neoplasm: Secondary | ICD-10-CM | POA: Diagnosis not present

## 2018-05-04 DIAGNOSIS — D485 Neoplasm of uncertain behavior of skin: Secondary | ICD-10-CM | POA: Diagnosis not present

## 2018-05-04 DIAGNOSIS — Z85828 Personal history of other malignant neoplasm of skin: Secondary | ICD-10-CM | POA: Diagnosis not present

## 2018-05-04 DIAGNOSIS — L570 Actinic keratosis: Secondary | ICD-10-CM | POA: Diagnosis not present

## 2018-05-04 DIAGNOSIS — D0361 Melanoma in situ of right upper limb, including shoulder: Secondary | ICD-10-CM | POA: Diagnosis not present

## 2018-05-04 DIAGNOSIS — X32XXXA Exposure to sunlight, initial encounter: Secondary | ICD-10-CM | POA: Diagnosis not present

## 2018-05-24 DIAGNOSIS — D0361 Melanoma in situ of right upper limb, including shoulder: Secondary | ICD-10-CM | POA: Diagnosis not present

## 2018-05-24 DIAGNOSIS — D485 Neoplasm of uncertain behavior of skin: Secondary | ICD-10-CM | POA: Diagnosis not present

## 2018-09-01 DIAGNOSIS — Z85828 Personal history of other malignant neoplasm of skin: Secondary | ICD-10-CM | POA: Diagnosis not present

## 2018-09-01 DIAGNOSIS — Z85820 Personal history of malignant melanoma of skin: Secondary | ICD-10-CM | POA: Diagnosis not present

## 2018-09-01 DIAGNOSIS — C4359 Malignant melanoma of other part of trunk: Secondary | ICD-10-CM | POA: Diagnosis not present

## 2018-09-01 DIAGNOSIS — Z08 Encounter for follow-up examination after completed treatment for malignant neoplasm: Secondary | ICD-10-CM | POA: Diagnosis not present

## 2018-09-01 DIAGNOSIS — D2272 Melanocytic nevi of left lower limb, including hip: Secondary | ICD-10-CM | POA: Diagnosis not present

## 2018-09-01 DIAGNOSIS — D225 Melanocytic nevi of trunk: Secondary | ICD-10-CM | POA: Diagnosis not present

## 2018-09-01 DIAGNOSIS — D485 Neoplasm of uncertain behavior of skin: Secondary | ICD-10-CM | POA: Diagnosis not present

## 2018-09-01 DIAGNOSIS — L821 Other seborrheic keratosis: Secondary | ICD-10-CM | POA: Diagnosis not present

## 2018-09-01 DIAGNOSIS — D2262 Melanocytic nevi of left upper limb, including shoulder: Secondary | ICD-10-CM | POA: Diagnosis not present

## 2018-09-01 DIAGNOSIS — D2271 Melanocytic nevi of right lower limb, including hip: Secondary | ICD-10-CM | POA: Diagnosis not present

## 2018-09-01 DIAGNOSIS — D2261 Melanocytic nevi of right upper limb, including shoulder: Secondary | ICD-10-CM | POA: Diagnosis not present

## 2018-09-16 DIAGNOSIS — C4359 Malignant melanoma of other part of trunk: Secondary | ICD-10-CM | POA: Diagnosis not present

## 2018-09-16 DIAGNOSIS — D485 Neoplasm of uncertain behavior of skin: Secondary | ICD-10-CM | POA: Diagnosis not present

## 2018-12-22 DIAGNOSIS — D2272 Melanocytic nevi of left lower limb, including hip: Secondary | ICD-10-CM | POA: Diagnosis not present

## 2018-12-22 DIAGNOSIS — Z08 Encounter for follow-up examination after completed treatment for malignant neoplasm: Secondary | ICD-10-CM | POA: Diagnosis not present

## 2018-12-22 DIAGNOSIS — Z85820 Personal history of malignant melanoma of skin: Secondary | ICD-10-CM | POA: Diagnosis not present

## 2018-12-22 DIAGNOSIS — Z85828 Personal history of other malignant neoplasm of skin: Secondary | ICD-10-CM | POA: Diagnosis not present

## 2018-12-22 DIAGNOSIS — L820 Inflamed seborrheic keratosis: Secondary | ICD-10-CM | POA: Diagnosis not present

## 2018-12-22 DIAGNOSIS — D2262 Melanocytic nevi of left upper limb, including shoulder: Secondary | ICD-10-CM | POA: Diagnosis not present

## 2018-12-22 DIAGNOSIS — L821 Other seborrheic keratosis: Secondary | ICD-10-CM | POA: Diagnosis not present

## 2018-12-22 DIAGNOSIS — D2271 Melanocytic nevi of right lower limb, including hip: Secondary | ICD-10-CM | POA: Diagnosis not present

## 2018-12-22 DIAGNOSIS — D485 Neoplasm of uncertain behavior of skin: Secondary | ICD-10-CM | POA: Diagnosis not present

## 2019-02-15 DIAGNOSIS — E7800 Pure hypercholesterolemia, unspecified: Secondary | ICD-10-CM | POA: Diagnosis not present

## 2019-02-15 DIAGNOSIS — Z79899 Other long term (current) drug therapy: Secondary | ICD-10-CM | POA: Diagnosis not present

## 2019-02-15 DIAGNOSIS — Z125 Encounter for screening for malignant neoplasm of prostate: Secondary | ICD-10-CM | POA: Diagnosis not present

## 2019-02-15 DIAGNOSIS — R7309 Other abnormal glucose: Secondary | ICD-10-CM | POA: Diagnosis not present

## 2019-02-21 DIAGNOSIS — E7800 Pure hypercholesterolemia, unspecified: Secondary | ICD-10-CM | POA: Diagnosis not present

## 2019-02-21 DIAGNOSIS — R7309 Other abnormal glucose: Secondary | ICD-10-CM | POA: Diagnosis not present

## 2019-02-21 DIAGNOSIS — Z125 Encounter for screening for malignant neoplasm of prostate: Secondary | ICD-10-CM | POA: Diagnosis not present

## 2019-02-21 DIAGNOSIS — Z0000 Encounter for general adult medical examination without abnormal findings: Secondary | ICD-10-CM | POA: Diagnosis not present

## 2019-02-21 DIAGNOSIS — D649 Anemia, unspecified: Secondary | ICD-10-CM | POA: Diagnosis not present

## 2019-02-21 DIAGNOSIS — I10 Essential (primary) hypertension: Secondary | ICD-10-CM | POA: Diagnosis not present

## 2019-02-21 DIAGNOSIS — Z79899 Other long term (current) drug therapy: Secondary | ICD-10-CM | POA: Diagnosis not present

## 2019-03-27 DIAGNOSIS — W19XXXA Unspecified fall, initial encounter: Secondary | ICD-10-CM | POA: Diagnosis not present

## 2019-03-27 DIAGNOSIS — S82142A Displaced bicondylar fracture of left tibia, initial encounter for closed fracture: Secondary | ICD-10-CM | POA: Diagnosis not present

## 2019-03-27 DIAGNOSIS — I10 Essential (primary) hypertension: Secondary | ICD-10-CM | POA: Diagnosis not present

## 2019-03-27 DIAGNOSIS — R609 Edema, unspecified: Secondary | ICD-10-CM | POA: Diagnosis not present

## 2019-03-27 DIAGNOSIS — R52 Pain, unspecified: Secondary | ICD-10-CM | POA: Diagnosis not present

## 2019-03-27 DIAGNOSIS — S8992XA Unspecified injury of left lower leg, initial encounter: Secondary | ICD-10-CM | POA: Diagnosis not present

## 2019-03-27 DIAGNOSIS — S82832A Other fracture of upper and lower end of left fibula, initial encounter for closed fracture: Secondary | ICD-10-CM | POA: Diagnosis not present

## 2019-03-28 DIAGNOSIS — S82222A Displaced transverse fracture of shaft of left tibia, initial encounter for closed fracture: Secondary | ICD-10-CM | POA: Diagnosis not present

## 2019-03-28 DIAGNOSIS — S82209A Unspecified fracture of shaft of unspecified tibia, initial encounter for closed fracture: Secondary | ICD-10-CM | POA: Insufficient documentation

## 2019-03-31 DIAGNOSIS — S82222A Displaced transverse fracture of shaft of left tibia, initial encounter for closed fracture: Secondary | ICD-10-CM | POA: Diagnosis not present

## 2019-04-05 ENCOUNTER — Other Ambulatory Visit: Payer: Self-pay | Admitting: Specialist

## 2019-04-05 DIAGNOSIS — S82222A Displaced transverse fracture of shaft of left tibia, initial encounter for closed fracture: Secondary | ICD-10-CM

## 2019-04-07 ENCOUNTER — Ambulatory Visit
Admission: RE | Admit: 2019-04-07 | Discharge: 2019-04-07 | Disposition: A | Discharge status: Home / Self Care | Payer: PPO | Source: Ambulatory Visit | Attending: Specialist | Admitting: Specialist

## 2019-04-07 ENCOUNTER — Other Ambulatory Visit: Payer: Self-pay

## 2019-04-07 DIAGNOSIS — S82222A Displaced transverse fracture of shaft of left tibia, initial encounter for closed fracture: Secondary | ICD-10-CM | POA: Diagnosis not present

## 2019-04-07 DIAGNOSIS — S82192D Other fracture of upper end of left tibia, subsequent encounter for closed fracture with routine healing: Secondary | ICD-10-CM | POA: Diagnosis not present

## 2019-04-10 ENCOUNTER — Other Ambulatory Visit: Payer: Self-pay | Admitting: Orthopedic Surgery

## 2019-04-10 DIAGNOSIS — R600 Localized edema: Secondary | ICD-10-CM | POA: Diagnosis not present

## 2019-04-10 DIAGNOSIS — S82142A Displaced bicondylar fracture of left tibia, initial encounter for closed fracture: Secondary | ICD-10-CM | POA: Diagnosis not present

## 2019-04-11 ENCOUNTER — Other Ambulatory Visit: Payer: Self-pay

## 2019-04-11 ENCOUNTER — Ambulatory Visit
Admission: RE | Admit: 2019-04-11 | Discharge: 2019-04-11 | Disposition: A | Discharge status: Home / Self Care | Payer: PPO | Source: Ambulatory Visit | Attending: Orthopedic Surgery | Admitting: Orthopedic Surgery

## 2019-04-11 DIAGNOSIS — R600 Localized edema: Secondary | ICD-10-CM | POA: Diagnosis not present

## 2019-04-18 ENCOUNTER — Other Ambulatory Visit: Payer: Self-pay

## 2019-04-18 ENCOUNTER — Ambulatory Visit: Payer: PPO | Attending: Orthopedic Surgery | Admitting: Physical Therapy

## 2019-04-18 ENCOUNTER — Encounter: Payer: Self-pay | Admitting: Physical Therapy

## 2019-04-18 DIAGNOSIS — M25562 Pain in left knee: Secondary | ICD-10-CM | POA: Insufficient documentation

## 2019-04-18 DIAGNOSIS — M6281 Muscle weakness (generalized): Secondary | ICD-10-CM | POA: Insufficient documentation

## 2019-04-18 DIAGNOSIS — R2681 Unsteadiness on feet: Secondary | ICD-10-CM | POA: Diagnosis not present

## 2019-04-18 NOTE — Therapy (Signed)
[facility] [address], [place], [address] Phone: [phone]   Fax:  [phone]  Physical Therapy Evaluation  Patient Details  Name: Glenn Watson MRN: [id number] Date of Birth: [birth year] Referring Provider (PT): Altamese [place], MD   Encounter Date: 04/18/2019  PT End of Session - 04/18/19 1920    Visit Number  1    Number of Visits  12    Date for PT Re-Evaluation  05/30/19    Authorization Type  [employer] reporting from 04/18/2019    Authorization - Visit Number  1    Authorization - Number of Visits  10    PT Start Time  1430    PT Stop Time  1530    PT Time Calculation (min)  60 min    Equipment Utilized During Treatment  Left knee immobilizer   No set of ROM limitation at knee brace   Activity Tolerance  Patient tolerated treatment well    Behavior During Therapy  [facility] for tasks assessed/performed       Past Medical History:  Diagnosis Date  . Hypertension     Past Surgical History:  Procedure Laterality Date  . APPENDECTOMY  2011   Dr. Bary Castilla  . COLONOSCOPY WITH PROPOFOL N/A 02/10/2017   Procedure: COLONOSCOPY WITH PROPOFOL;  Surgeon: Christene Lye, MD;  Location: [facility] ENDOSCOPY;  Service: Endoscopy;  Laterality: N/A;    There were no vitals filed for this visit.    Subjective Assessment - 04/18/19 1859    Subjective  Patient reports the onset of L tibial and fibula fracture since 03/27/2019 due to the accident of a fall from stairs at a rainy night. Patient went to ER and surgeon decided to proceed with conservative treatment with knee immobilizer (with no set of ROM at today's session). The pain is currently 2/10 and increase pain with knee flexion at end range.    Patient is accompained by:  Family member    Pertinent History  L ankle surgery; hypertension; history of falling; L knee pain.    Limitations  Lifting;Standing;Walking;House hold activities    Diagnostic tests  CT: Comminuted and impacted intra-articular fracture of the proximaltibia as described. This fracture involves both tibial plateaus andis associated with depression of the articular surface posteriorly. Mildly comminuted intra-articular fracture of the fibular headand neck. 04/07/2019    Patient Stated Goals  Able to walk without AD    Currently in Pain?  Yes    Pain Score  2     Pain Location  Leg    Pain Orientation  Left;Anterior;Mid    Pain Descriptors / Indicators  Aching    Pain Type  Acute pain    Pain Onset  1 to 4 weeks ago    Pain Frequency  Constant    Aggravating Factors   End range knee flexion and straight leg raise    Pain Relieving Factors  Pain medication; resting in cast       [facility] PT Assessment - 04/19/19 0001      Assessment   Medical Diagnosis   Tib/Fib Fx L leg    Referring Provider (PT)  Altamese [place], MD    Onset Date/Surgical Date  03/27/19    Hand Dominance  Right    Next MD Visit  04/26/2019      Precautions   Required Braces or Orthoses  Knee Immobilizer - Left      Restrictions   Weight Bearing  Restrictions  Yes    LLE Weight Bearing  Non weight bearing      Balance Screen   Has the patient fallen in the past 6 months  Yes    How many times?  1    Has the patient had a decrease in activity level because of a fear of falling?   Yes    Is the patient reluctant to leave their home because of a fear of falling?   Yes      [place]  Private residence    Living Arrangements  Spouse/significant other    Available Help at Discharge  Family    Type of [place] to enter;Level entry    [place]  Two level    Alternate Level Stairs-Number of Steps  --   [place] - 2 wheels;Crutches      Prior Function   Level of Independence  Independent    Vocation  Retired    Radio producer, cooking, reading       Cognition   Overall Cognitive Status  Within Functional Limits for tasks assessed      Observation/Other Assessments-Edema    Edema  Circumferential       Clinical impression: Patient is a 73 y.o. male who presents to outpatient physical therapy with a referral for medical diagnosis of Tib/Fib Fracture L leg. This patient presents with the sign and symptoms consistent with reduced balance, stiffness, with functional activities/recreational activities consistent with s/p L tib/fib fracture on 03/27/2019.Marland Kitchen Upon assessment, pt demonstrated deficits such as deficits in strength, mobility, weakness, significant pain with mobility, and limited L knee ROM. He is unable to weight bear through the L LE at this time due to weight bearing precautions. These deficits limit the patient ability to perform things such as ADLs, IADLs, social participation, caring for others, engaging in hobbies (house working, cooking, and driving), and impairs their quality of life. The pt will benefit from skilled PT services to address deficits and return to PLOF and independence, recreational activity and work.   Medical history: L ankle surgery; hypertension; history of falling; history of L knee pain.  Subjective Patient reports the onset of L tibial and fibula fracture since 03/27/2019 due to the accident of a fall from stairs at a rainy night. Patient went to ER and surgeon decided to proceed with conservative treatment with knee immobilizer (with no set of ROM at today's session). The pain is currently 2/10 and increase pain with knee flexion at end range.    OBJECTIVE  MUSCULOSKELETAL: Tremor: Absent Bulk: significant L LE swelling knee to ankle LE circumference (10cm proximal from heel):  L LE: 28cm   R LE: 25cm   Posture No gross abnormalities noted in standing or seated posture.  Lumbar/Hip AROM: WFL   Gait Ambulated with rolling walker and none weight bearing at L LE  Transfers:  - sit <> stand  from chair and elevated plinth with mod I using RW  Bed mobility:   - rolling and supine <> sit with mod I (additional time and planning).    Strength R/L (L LE = deferred manually resisted motions at knee, ankle, and hip rotation due to post-fx precautions)  4/3+ Hip flexion 5/     R Hip external rotation; L hip deferred due to precautions 5/  R Hip internal rotation; L hip deferred due to precautions 3+/3 Hip abduction  5/4* Hip extension 5/2-    R Knee extension; L knee deferred due to pain 5/2    R Knee flexion; L knee deferred due to pain 5/2    Ankle Dorsiflexion; L ankle restricted due to swelling, pain 5/2    Ankle Plantarflexion; L ankle restricted due to swelling, pain *indicates pain  AROM  Knee R/L Flexion: 130/80   - L knee PROM 95 with moderated discomfort.* (without brace)  - R knee PROM WNL Extension: WFL bilaterally  *indicates pain   Hip: WFL Ankle: R ankle WFL and L ankle limited due to swelling.   Functional Mobility  Objective measurements completed on examination: See above findings.   TREATMENT:   Therapeutic exercise: to centralize symptoms and improve ROM, strength, muscular endurance, and activity tolerance required for successful completion of functional activities.  - Straight leg raise AROM - AROM x5: discontinued due to significant extensor lag without assistance - to focus on AROM quad set. Brace removed.  - Supine L quad set x10, 5-10 second hold without brace - Supine L heel slide x 10 (brace doffed) - Seated L Long arc quad x 10 (brace donned) - also completed all exercises 2-3 reps on R side to help patient feel normal motor control and exercise performance prior to attempting L LE.  To improve L LE strength, endurance as well as ROM  - don/doff of brace with therapist assistance  -Patient was educated on diagnosis, anatomy and pathology involved, prognosis, role of PT, and was given an HEP, demonstrating exercise with proper form  following verbal and tactile cues, and was given a paper hand out to continue exercise at home. Pt was educated on and agreed to plan of care.  HOME EXERCISE PROGRAM  Access Code: [email or web address]  URL: [email or web address]  Date: 04/18/2019  Prepared by: Rosita Kea   Exercises Supine Quad Set - 20 reps - 5-10 seconds hold - 2-3x daily - 7x weekly Supine Heel Slides - 2-3 sets - 20 reps - 1 seconds hold - 7x weekly Seated Long Arc Quad - 20 reps - 3 seconds hold - 2-3x daily - 7x weekly     PT Education - 04/18/19 1920    Education Details  Exercise purpose/form. Self management techniques. Education on diagnosis, prognosis, POC, anatomy and physiology of current condition Education on HEP including handout    Person(s) Educated  Patient    Methods  Explanation;Demonstration;Tactile cues;Verbal cues    Comprehension  Verbalized understanding;Returned demonstration;Tactile cues required;Verbal cues required       PT Short Term Goals - 04/18/19 1926      PT SHORT TERM GOAL #1   Title  Be independent with initial home exercise program for self-management of symptoms.    Baseline  initial HEP provided at IE (04/18/2019);    Time  2    Period  Weeks    Status  New    Target Date  05/02/19        PT Long Term Goals - 04/18/19 1927      PT LONG TERM GOAL #1   Title  Be independent with a long-term home exercise program for self-management of symptoms.    Baseline  initial HEP provided at IE (04/18/2019);    Time  6    Period  Weeks    Status  New    Target Date  05/30/19      PT LONG  TERM GOAL #2   Title  Demonstrate improved FOTO score by 10 units to demonstrate improvement in overall condition and self-reported functional ability.    Baseline  44 (04/18/2019);    Time  6    Period  Weeks    Status  New    Target Date  05/30/19      PT LONG TERM GOAL #3   Title  Complete community, work and/or recreational activities without limitation due to current  condition.    Baseline  Difficulites with walking, standing, shopping, driving, cooking and bathing (04/18/2019)    Time  6    Period  Weeks    Status  New    Target Date  05/30/19      PT LONG TERM GOAL #4   Title  Increase L knee flexion AROM to 140 degrees to be able to complete ADLs without limitation of knee flexion    Baseline  PROM 95 degrees of flexion with pain at end range and AROM 80 degrees(04/18/2019);    Time  6    Period  Weeks    Status  New    Target Date  05/30/19      PT LONG TERM GOAL #5   Title  Pt will increase strength of by at least 1/2 MMT grade in order to demonstrate improvement in strength and function.    Baseline  See objectives on IE(04/18/2019);    Time  6    Period  Weeks    Status  New    Target Date  05/30/19             Plan - 04/18/19 1908    Clinical Impression Statement  Patient is a 73 y.o. male who presents to outpatient physical therapy with a referral for medical diagnosis of Tib/Fib Fracture L leg. This patient presents with the sign and symptoms consistent with reduced balance, stiffness, with functional activities/recreational activities. Upon assessment, pt demonstrated deficits such as deficits in strength, mobility, weakness, significant pain with mobility, and limited L knee ROM. These deficits limit the patient ability to perform things such as ADLs, IADLs, social participation, caring for others, engaging in hobbies (house working, cooking, and driving), and impairs their quality of life. The pt will benefit from skilled PT services to address deficits and return to PLOF and independence, recreational activity and work.    Personal Factors and Comorbidities  Comorbidity 2;Age    Comorbidities  L ankle surgery; hypertension; history of falling; L knee pain.    Examination-Activity Limitations  Bathing;Bed Mobility;Locomotion Level;Stairs;Stand;Hygiene/Grooming;Bend;Caring for Others;Sleep;Lift;Transfers;Toileting;Squat;Carry     Examination-Participation Restrictions  Yard Work;Driving;Meal Prep;Laundry;Shop;Cleaning    Stability/Clinical Decision Making  Evolving/Moderate complexity    Clinical Decision Making  Moderate    Rehab Potential  Good    PT Frequency  2x / week    PT Duration  6 weeks    PT Treatment/Interventions  ADLs/Self Care Home Management;Cryotherapy;Moist Heat;Gait training;Stair training;Functional mobility training;Therapeutic activities;Therapeutic exercise;Balance training;Neuromuscular re-education;Manual techniques;Joint Manipulations;Passive range of motion    PT Next Visit Plan  Strengthening, ROM and muscle endurance    PT Home Exercise Plan  Medbridge: [email or web address]    Consulted and Agree with Plan of Care  Patient;Family member/caregiver    Family Member Consulted  Wife       Patient will benefit from skilled therapeutic intervention in order to improve the following deficits and impairments:  Abnormal gait, Decreased endurance, Decreased activity tolerance, Decreased range of motion, Decreased strength, Pain, Impaired flexibility, Difficulty  walking, Decreased balance, Impaired perceived functional ability, Decreased knowledge of use of DME, Decreased mobility  Visit Diagnosis: Acute pain of left knee  Unsteadiness on feet  Muscle weakness (generalized)     Problem List There are no active problems to display for this patient.   Sherrilyn Rist, SPT 04/19/19, 6:52 PM  Everlean Alstrom. Graylon Good, PT, DPT 04/19/19, 7:16 PM  [facility] [address], [place], [address] Phone: [phone]   Fax:  [phone]  Name: Glenn Watson MRN: [id number] Date of Birth: 12/22/1945

## 2019-04-19 NOTE — Addendum Note (Signed)
Addended by: Rosita Kea R on: 04/19/2019 07:19 PM   Modules accepted: Orders

## 2019-04-20 ENCOUNTER — Encounter: Payer: Self-pay | Admitting: Physical Therapy

## 2019-04-20 ENCOUNTER — Other Ambulatory Visit: Payer: Self-pay

## 2019-04-20 ENCOUNTER — Ambulatory Visit: Payer: PPO | Admitting: Physical Therapy

## 2019-04-20 DIAGNOSIS — R2681 Unsteadiness on feet: Secondary | ICD-10-CM

## 2019-04-20 DIAGNOSIS — M6281 Muscle weakness (generalized): Secondary | ICD-10-CM

## 2019-04-20 DIAGNOSIS — M25562 Pain in left knee: Secondary | ICD-10-CM

## 2019-04-20 NOTE — Therapy (Signed)
[facility] [address] [place], [address] Phone: [phone]   Fax:  [phone]  Physical Therapy Treatment  Patient Details  Name: Glenn Watson MRN: [id number] Date of Birth: 05/31/1946 Referring Provider (PT): Altamese Sheffield, MD   Encounter Date: 04/20/2019  PT End of Session - 04/20/19 1054    Visit Number  2    Number of Visits  24    Date for PT Re-Evaluation  07/11/19    Authorization Type  [employer] reporting from 04/18/2019    Authorization - Visit Number  2    Authorization - Number of Visits  10    PT Start Time  [phone]    PT Stop Time  1040    PT Time Calculation (min)  54 min    Equipment Utilized During Treatment  Left knee immobilizer   No set of ROM limitation at knee brace   Activity Tolerance  Patient tolerated treatment well    Behavior During Therapy  [facility] for tasks assessed/performed       Past Medical History:  Diagnosis Date  . Hypertension     Past Surgical History:  Procedure Laterality Date  . APPENDECTOMY  2011   Dr. Bary Castilla  . COLONOSCOPY WITH PROPOFOL N/A 02/10/2017   Procedure: COLONOSCOPY WITH PROPOFOL;  Surgeon: Christene Lye, MD;  Location: [facility] ENDOSCOPY;  Service: Endoscopy;  Laterality: N/A;    There were no vitals filed for this visit.  Subjective Assessment - 04/20/19 1103    Subjective  Patient has been doing well since last visit and doing HEP without any problems. 2/10 pain at L knee as baseline.    Patient is accompained by:  Family member    Pertinent History  L ankle surgery; hypertension; history of falling; L knee pain.    Limitations  Lifting;Standing;Walking;House hold activities    Diagnostic tests  CT: Comminuted and impacted intra-articular fracture of the proximaltibia as described. This fracture involves both tibial plateaus andis associated with depression of the articular surface posteriorly. Mildly comminuted  intra-articular fracture of the fibular headand neck. 04/07/2019    Patient Stated Goals  Able to walk without AD    Pain Score  2     Pain Location  Knee    Pain Orientation  Left    Pain Onset  1 to 4 weeks ago       Therapeutic exercise: to centralize symptoms and improve ROM, strength, muscular endurance, and activity tolerance required for successful completion of functional activities.  - Straight leg raise: 5 minutes with NMES (2x4 inch oval pads placed at proximal and distal L quads, 10s on 20s off; 51mA; [place]; duty cycle 50%; Ramp: 5s) Min to [employer] provided to improve quad activation.  - Supine short arc 5 minutes with NMES (setting same as above)  Min to Mod AAROM provided to improve quad activation.  Cuing to encourage quadricep contraction,  - Seated Long arc quad 3 x 10 reps (brace donned) Patient states very little discomfort and didn't change pain with intensity.  -Patient was educated on diagnosis, prognosis, and role of PT. Pt was educated on and agreed to plan of care. - assistance provided to doff/don brace.  - completed YFRF after sitting up while waiting for orthostatic hypotension to clear (see below).  Therapeutic activities: for functional strengthening and improved functional activity tolerance.  - Education provided to trial crutches for stair negotiations. Cuing provided to improve forms, hand  positioning, railings and techniques. SBA to CGA provided. Patient's foot didn't clear steps for 4 times due to fatigue.   - Ascended and descended four 6inch steps x 4 trials. X 1 with BUE on rails, x3 with unilateral UE support on rail and contralateral UE support on axial crutch.  Patient tolerated the session well and continue working towards his goal. Patient demonstrated good understanding of techniques, forms, hand positions with functional tasks (stair negotiation) but patient still presented apprehension using crutches for stairs negotiation and multiple foot  caught by steps during the session. Education and recommendation provided to not using crutches for stairs ambulation at home now until patient able to perform safe stairs transfer consistently at clinic. Patient demonstrated good muscle activation pattern with L LE strengthening exercises but still unable to perform full  AROM L knee extension. With the above presentations, session will continue focus on strengthening, endurance, and increase of functional mobility tolerance. Patient would benefit from continued physical therapy to address self-care for her pain, strength, and endurance to improve functional mobility and ADLs tolerance.   Yellow Flag Risk Form  1. Please indicate your usual level of pain during the past week. No Pain     Worst pain possible 0    1    2    3    4    5    6    7    8    9    10   2. Does pain, numbness, tingling, or weakness, extend into your leg (from back) &/or arm (from neck)? None of the time    All of the time 0    1    2    3    4    5    6    7    8    9    10   3. How would you rate your general health? Poor     Excellent (score 10-X) 0    1    2    3    4    5    6    7    8    9    10   4. If you had to spend the rest of your life with your condition as it is right now, how would you feel about it? Delighted     Terrible 0    1    2    3    4    5    6    7    8    9    10   5. How anxious (eg. Tense, uptight, irritable, fearful, difficulty in concentrating / relaxing) have you been feeling during the past week? Not at all     Extremely anxious 0    1    2    3    4    5    6    7    8    9    10   6. How much have you been able to control (ie. Reduce / help) your pain / complaint on your own during the past week? I can reduce it    I can't reduce it at all 0    1    2    3    4    5    6    7     8  9    10  7. Please indicate how depressed (eg. down in dumps, sad, downhearted, in low spiritis, pessimistic, feelings of hopelessness) have you been  feeling in the past week. Not depressed at all   Extremely depressed 0    1    2    3    4    5    6    7    8    9    10   8. One a scale of [id number], how certain are you that you will be doing normal activities or working in six months? Very certain    Not certain at all 0    1    2    3    4    5    6    7    8    9    10   9. I can do light work for an hour. Completely agree   Completely disagree 0    1    2    3    4    5    6    7    8    9    10   10. I can sleep at night. Completely agree   Completely disagree 0    1    2    3    4    5    6    7    8    9    10   11. An increase in pain is an indication that I should stop what I am doing until the pain decreases. Completely agree   Completely disagree 0    1    2    3    4    5    6    7    8    9    10   12. Physical activity makes my pain worse. Completely agree   Completely disagree 0    1    2    3    4    5    6    7    8    9    10   13. I should not do my normal activities including work, with my pain present. Completely agree   Completely disagree 0    1    2    3    4    5    6    7    8    9    10   Total score:  29/130  (score question 3 as 10-X)  Interpretation:  < 49 = low risk for psychosocial factors (green)    PT Education - 04/20/19 1101    Education Details  Exercise purpose/form. Self management, and his current condition.    Person(s) Educated  Patient;Spouse    Methods  Explanation;Demonstration;Tactile cues;Verbal cues    Comprehension  Verbalized understanding;Returned demonstration;Verbal cues required;Tactile cues required       PT Short Term Goals - 04/18/19 1926      PT SHORT TERM GOAL #1   Title  Be independent with initial home exercise program for self-management of symptoms.    Baseline  initial HEP provided at IE (04/18/2019);    Time  2    Period  Weeks    Status  New    Target Date  05/02/19  PT Long Term Goals - 04/18/19 1927      PT LONG TERM GOAL #1   Title  Be  independent with a long-term home exercise program for self-management of symptoms.    Baseline  initial HEP provided at IE (04/18/2019);    Time  12    Period  Weeks    Status  New    Target Date  07/11/19      PT LONG TERM GOAL #2   Title  Demonstrate improved FOTO score by 10 units to demonstrate improvement in overall condition and self-reported functional ability.    Baseline  44 (04/18/2019);    Time  12    Period  Weeks    Status  New    Target Date  07/11/19      PT LONG TERM GOAL #3   Title  Complete community, work and/or recreational activities without limitation due to current condition.    Baseline  Difficulites with walking, standing, shopping, driving, cooking and bathing, currently NWB L LE (04/18/2019)    Time  12    Period  Weeks    Status  New    Target Date  07/11/19      PT LONG TERM GOAL #4   Title  Increase L knee flexion AROM to 140 degrees to be able to complete ADLs without limitation of knee flexion    Baseline  PROM 95 degrees of flexion with pain at end range and AROM 80 degrees(04/18/2019);    Time  12    Period  Weeks    Status  New    Target Date  07/11/19      PT LONG TERM GOAL #5   Title  Pt will increase LE strength to equal or greater than 4+/5 MMT in order to demonstrate improvement in strength and function.    Baseline  See objectives on IE(04/18/2019);    Time  12    Period  Weeks    Status  New    Target Date  07/11/19            Plan - 04/20/19 1059    Clinical Impression Statement  Patient tolerated the session well and continue working towards his goal. Patient demonstrated good understanding of techniques, forms, hand positions with functional tasks (stair negotiation) but patient still presented apprehension using crutches for stairs negotiation and multiple foot caught by steps during the session. Education and recommendation provided to not using crutches for stairs ambulation at home now until patient able to perform safe  stairs transfer consistently at clinic. Patient demonstrated good muscle activation pattern with L LE strengthening exercises but still unable to perform full  AROM L knee extension. With the above presentations, session will continue focus on strengthening, endurance, and increase of functional mobility tolerance. Patient would benefit from continued physical therapy to address self-care for her pain, strength, and endurance to improve functional mobility and ADLs tolerance.    Personal Factors and Comorbidities  Comorbidity 2;Age    Comorbidities  L ankle surgery; hypertension; history of falling; L knee pain.    Examination-Activity Limitations  Bathing;Bed Mobility;Locomotion Level;Stairs;Stand;Hygiene/Grooming;Bend;Caring for Others;Sleep;Lift;Transfers;Toileting;Squat;Carry    Examination-Participation Restrictions  Yard Work;Driving;Meal Prep;Laundry;Shop;Cleaning    Stability/Clinical Decision Making  Evolving/Moderate complexity    Rehab Potential  Good    PT Frequency  2x / week    PT Duration  12 weeks    PT Treatment/Interventions  ADLs/Self Care Home Management;Cryotherapy;Moist Heat;Gait training;Stair training;Functional mobility training;Therapeutic activities;Therapeutic exercise;Balance training;Neuromuscular re-education;Manual  techniques;Joint Manipulations;Passive range of motion;Dry needling;DME Instruction    PT Next Visit Plan  Strengthening, ROM and muscle endurance    PT Home Exercise Plan  Med[employer]: [email or web address]    Consulted and Agree with Plan of Care  Patient;Family member/caregiver    Family Member Consulted  Wife       Patient will benefit from skilled therapeutic intervention in order to improve the following deficits and impairments:  Abnormal gait, Decreased endurance, Decreased activity tolerance, Decreased range of motion, Decreased strength, Pain, Impaired flexibility, Difficulty walking, Decreased balance, Impaired perceived functional ability, Decreased knowledge  of use of DME, Decreased mobility  Visit Diagnosis: Acute pain of left knee  Unsteadiness on feet  Muscle weakness (generalized)     Problem List There are no active problems to display for this patient.   Sherrilyn Rist, SPT 04/20/19, 11:08 AM  Everlean Alstrom. Graylon Good, PT, DPT 04/20/19, 2:21 PM   [facility] [address] [place], [address] Phone: [phone]   Fax:  [phone]  Name: SAVIR ERDMANN MRN: [id number] Date of Birth: 1945-08-15

## 2019-04-24 ENCOUNTER — Other Ambulatory Visit: Payer: Self-pay

## 2019-04-24 ENCOUNTER — Encounter: Payer: Self-pay | Admitting: Physical Therapy

## 2019-04-24 ENCOUNTER — Ambulatory Visit: Payer: PPO | Admitting: Physical Therapy

## 2019-04-24 DIAGNOSIS — R2681 Unsteadiness on feet: Secondary | ICD-10-CM

## 2019-04-24 DIAGNOSIS — M6281 Muscle weakness (generalized): Secondary | ICD-10-CM

## 2019-04-24 DIAGNOSIS — R600 Localized edema: Secondary | ICD-10-CM | POA: Diagnosis not present

## 2019-04-24 DIAGNOSIS — S82142D Displaced bicondylar fracture of left tibia, subsequent encounter for closed fracture with routine healing: Secondary | ICD-10-CM | POA: Diagnosis not present

## 2019-04-24 DIAGNOSIS — M25562 Pain in left knee: Secondary | ICD-10-CM

## 2019-04-24 NOTE — Therapy (Signed)
[facility] [address] [place], [address] Phone: [phone]   Fax:  [phone]  Physical Therapy Treatment  Patient Details  Name: Glenn Watson MRN: [id number] Date of Birth: 1946/03/25 Referring Provider (PT): Altamese [place], MD   Encounter Date: 04/24/2019  PT End of Session - 04/24/19 1427    Visit Number  3    Number of Visits  24    Date for PT Re-Evaluation  07/11/19    Authorization Type  [employer] reporting from 04/18/2019    Authorization - Visit Number  3    Authorization - Number of Visits  10    PT Start Time  [id number]    PT Stop Time  1203    PT Time Calculation (min)  60 min    Equipment Utilized During Treatment  Left knee immobilizer   No set of ROM limitation at knee brace   Activity Tolerance  Patient tolerated treatment well    Behavior During Therapy  [facility] for tasks assessed/performed       Past Medical History:  Diagnosis Date  . Hypertension     Past Surgical History:  Procedure Laterality Date  . APPENDECTOMY  2011   Dr. Bary Castilla  . COLONOSCOPY WITH PROPOFOL N/A 02/10/2017   Procedure: COLONOSCOPY WITH PROPOFOL;  Surgeon: Christene Lye, MD;  Location: [facility] ENDOSCOPY;  Service: Endoscopy;  Laterality: N/A;    There were no vitals filed for this visit.  Subjective Assessment - 04/24/19 1408    Subjective  Patient reports no pain upon arrival. He arrived with RW and no brace, stating he just came from his follow up visit with Dr. Marcelino Scot who removed the brace until he is able to weight bear which is expeted in 2-4 weeks. He brought in a note from his wife who took notes from Dr. Marcelino Scot that includes the following: radiograph today showed unchanged aligment of fx. Can begin pool ROM, continue A-AA-PROM, strengthening to improve AROM and decrease extensor lag. .Patient reports he felt fine after last session and his HEP is getting easier.    Patient is  accompained by:  Family member    Pertinent History  L ankle surgery; hypertension; history of falling; L knee pain.    Limitations  Lifting;Standing;Walking;House hold activities    Diagnostic tests  CT: Comminuted and impacted intra-articular fracture of the proximaltibia as described. This fracture involves both tibial plateaus andis associated with depression of the articular surface posteriorly. Mildly comminuted intra-articular fracture of the fibular headand neck. 04/07/2019    Patient Stated Goals  Able to walk without AD    Currently in Pain?  No/denies    Pain Onset  1 to 4 weeks ago        Manual therapy: to reduce pain and tissue tension, improve range of motion, neuromodulation, in order to promote improved ability to complete functional activities. - patient in supine, R/L legs propped up on bolster at times for comfort. Orthopedic edema massage over entire L LE including flat finger circles, scoop technique, and thumb circles to move fluid out of LE and improve compliance of soft tissue. Avoided tender regions and scabbed regions as well as covered healing wounds. Noted for significant softening of tissue at calf and lower leg. Ankle and foot remained quite dense. Provided to decrease swelling to promote healing, decrease pain, and promote improved activation of quads.  - assistance provided to doff/don compression garment.   Therapeutic exercise:to centralize  symptoms and improve ROM, strength, muscular endurance, and activity tolerance required for successful completion of functional activities. - L quad sets: 5 minutes with NMES (2x4 inch oval pads placed at proximal and distal L quads, 10s on 10s off; 34mA; [place]; duty cycle 50%; Ramp: 5s) Min to [employer] provided at heel to improve quad activation. Manual facilitation over quad to improve activation. Towel roll placed behind L knee. Cuing to encourage quadricep contraction.  - Sidelying L hip abduction with knee held in  extension. 2x10 with heavy cuing and manual assistance to to prevent hip flexion.  - supine glute set with modified bridge and B knee extension over rust colored cylindrical bolster under bilateral mid posterior thigh, to strength hips, quads, and core while maintaining WB precautions. Very challenging. Mild discomfort over L tibial tuberosity when allowing knee to flex following so assistance provided by clinician to lower.  Patient states very little discomfort and didn't change pain with intensity.  Patient required extra time to adapt to sitting posture due to symptoms of OH that are usual for him.   Patient tolerated the sessionwelland demonstrated improvements in tissue density with edema massage, demonstrating improvement in swelling. Patient demonstrated mild improvement in quad activation but is still unable to reach full knee extension without assistance. Able to progress to more aggressive hip strengthening. Patient preferred not to work on crutches more today but did have them in the car. Future sessions will continue to focus on strengthening, edema control, endurance, and increase of functional mobility tolerance.Patient would benefit from continued physical therapy to address self-care for her pain, strength, and endurance to improve functional mobility and ADLs tolerance.    PT Education - 04/24/19 1427    Education Details  Exercise purpose/form. Self management, and his current condition.    Person(s) Educated  Patient    Methods  Explanation;Demonstration;Tactile cues;Verbal cues    Comprehension  Verbalized understanding;Returned demonstration       PT Short Term Goals - 04/24/19 1426      PT SHORT TERM GOAL #1   Title  Be independent with initial home exercise program for self-management of symptoms.    Baseline  initial HEP provided at IE (04/18/2019);    Time  2    Period  Weeks    Status  Achieved    Target Date  05/02/19        PT Long Term Goals - 04/18/19  1927      PT LONG TERM GOAL #1   Title  Be independent with a long-term home exercise program for self-management of symptoms.    Baseline  initial HEP provided at IE (04/18/2019);    Time  12    Period  Weeks    Status  New    Target Date  07/11/19      PT LONG TERM GOAL #2   Title  Demonstrate improved FOTO score by 10 units to demonstrate improvement in overall condition and self-reported functional ability.    Baseline  44 (04/18/2019);    Time  12    Period  Weeks    Status  New    Target Date  07/11/19      PT LONG TERM GOAL #3   Title  Complete community, work and/or recreational activities without limitation due to current condition.    Baseline  Difficulites with walking, standing, shopping, driving, cooking and bathing, currently NWB L LE (04/18/2019)    Time  12    Period  Weeks  Status  New    Target Date  07/11/19      PT LONG TERM GOAL #4   Title  Increase L knee flexion AROM to 140 degrees to be able to complete ADLs without limitation of knee flexion    Baseline  PROM 95 degrees of flexion with pain at end range and AROM 80 degrees(04/18/2019);    Time  12    Period  Weeks    Status  New    Target Date  07/11/19      PT LONG TERM GOAL #5   Title  Pt will increase LE strength to equal or greater than 4+/5 MMT in order to demonstrate improvement in strength and function.    Baseline  See objectives on IE(04/18/2019);    Time  12    Period  Weeks    Status  New    Target Date  07/11/19            Plan - 04/24/19 1426    Clinical Impression Statement  Patient tolerated the session well and demonstrated improvements in tissue density with edema massage, demonstrating improvement in swelling. Patient demonstrated mild improvement in quad activation but is still unable to reach full knee extension without assistance. Able to progress to more aggressive hip strengthening. Patient preferred not to work on crutches more today but did have them in the car.  Future sessions will continue to focus on strengthening, edema control, endurance, and increase of functional mobility tolerance. Patient would benefit from continued physical therapy to address self-care for her pain, strength, and endurance to improve functional mobility and ADLs tolerance.    Personal Factors and Comorbidities  Comorbidity 2;Age    Comorbidities  L ankle surgery; hypertension; history of falling; L knee pain.    Examination-Activity Limitations  Bathing;Bed Mobility;Locomotion Level;Stairs;Stand;Hygiene/Grooming;Bend;Caring for Others;Sleep;Lift;Transfers;Toileting;Squat;Carry    Examination-Participation Restrictions  Yard Work;Driving;Meal Prep;Laundry;Shop;Cleaning    Stability/Clinical Decision Making  Evolving/Moderate complexity    Rehab Potential  Good    PT Frequency  2x / week    PT Duration  12 weeks    PT Treatment/Interventions  ADLs/Self Care Home Management;Cryotherapy;Moist Heat;Gait training;Stair training;Functional mobility training;Therapeutic activities;Therapeutic exercise;Balance training;Neuromuscular re-education;Manual techniques;Joint Manipulations;Passive range of motion;Dry needling;DME Instruction    PT Next Visit Plan  Strengthening, ROM and muscle endurance    PT Home Exercise Plan  Medbridge: [email or web address]    Consulted and Agree with Plan of Care  Patient;Family member/caregiver    Family Member Consulted  Wife       Patient will benefit from skilled therapeutic intervention in order to improve the following deficits and impairments:  Abnormal gait, Decreased endurance, Decreased activity tolerance, Decreased range of motion, Decreased strength, Pain, Impaired flexibility, Difficulty walking, Decreased balance, Impaired perceived functional ability, Decreased knowledge of use of DME, Decreased mobility  Visit Diagnosis: Acute pain of left knee  Unsteadiness on feet  Muscle weakness (generalized)     Problem List There are no active problems  to display for this patient.   Everlean Alstrom. Graylon Good, PT, DPT 04/24/19, 2:28 PM  [facility] [address], [place], [address] Phone: [phone]   Fax:  [phone]  Name: MERLYN BATTAGLIA MRN: [id number] Date of Birth: [birth year]

## 2019-04-26 ENCOUNTER — Telehealth: Payer: Self-pay | Admitting: Physical Therapy

## 2019-04-26 ENCOUNTER — Ambulatory Visit: Payer: PPO | Admitting: Physical Therapy

## 2019-04-26 ENCOUNTER — Other Ambulatory Visit: Payer: Self-pay

## 2019-04-26 ENCOUNTER — Encounter: Payer: Self-pay | Admitting: Physical Therapy

## 2019-04-26 DIAGNOSIS — R2681 Unsteadiness on feet: Secondary | ICD-10-CM

## 2019-04-26 DIAGNOSIS — M25562 Pain in left knee: Secondary | ICD-10-CM | POA: Diagnosis not present

## 2019-04-26 DIAGNOSIS — M6281 Muscle weakness (generalized): Secondary | ICD-10-CM

## 2019-04-26 NOTE — Therapy (Signed)
[facility] [address] [place], [address] Phone: [phone]   Fax:  [phone]  Physical Therapy Treatment  Patient Details  Name: Glenn Watson MRN: [id number] Date of Birth: May 18, 1946 Referring Provider (PT): Altamese Poplar, MD    Encounter Date: 04/26/2019  PT End of Session - 04/26/19 1111    Visit Number  4    Number of Visits  24    Date for PT Re-Evaluation  07/11/19    Authorization Type  [employer] reporting from 04/18/2019    Authorization - Visit Number  4    Authorization - Number of Visits  10    PT Start Time  1112    PT Stop Time  1208    PT Time Calculation (min)  56 min    Equipment Utilized During Treatment  --    Activity Tolerance  Patient tolerated treatment well    Behavior During Therapy  [facility] for tasks assessed/performed       Past Medical History:  Diagnosis Date  . Hypertension     Past Surgical History:  Procedure Laterality Date  . APPENDECTOMY  2011   Dr. Bary Castilla  . COLONOSCOPY WITH PROPOFOL N/A 02/10/2017   Procedure: COLONOSCOPY WITH PROPOFOL;  Surgeon: Christene Lye, MD;  Location: [facility] ENDOSCOPY;  Service: Endoscopy;  Laterality: N/A;    There were no vitals filed for this visit.  Subjective Assessment - 04/26/19 1359    Subjective  Patient reports no pain but only discomfort at L anterior knee. Patient didn't wear brace and arrived with crutches. Patient states don compression sock has became much easier today and highly related to manual therpay from last session.    Patient is accompained by:  Family member    Pertinent History  L ankle surgery; hypertension; history of falling; L knee pain.    Limitations  Lifting;Standing;Walking;House hold activities    Diagnostic tests  CT: Comminuted and impacted intra-articular fracture of the proximaltibia as described. This fracture involves both tibial plateaus andis associated with  depression of the articular surface posteriorly. Mildly comminuted intra-articular fracture of the fibular headand neck. 04/07/2019    Patient Stated Goals  Able to walk without AD    Currently in Pain?  No/denies    Pain Onset  1 to 4 weeks ago      Objective: L knee AROM extension -20 degrees to neutral  L knee AAROM with therapy band -10 degrees to neutral   L LE Circumferences (in cm):  Pre-Manual (compression stocking on):   ankle 10cm proximal to heel 28   Lower leg at narrowest location: 26.5  Lower leg at widest location: 44  Figure 8: 66.5  Post-Manual (no compression stocking on):   Ankle 10cm proximal to heel: 28     Lower leg at narrowest location: 26  Lower leg at widest location: 44  Figure 8: 65  Therapeutic exercise:to centralize symptoms and improve ROM, strength, muscular endurance, and activity tolerance required for successful completion of functional activities. - Ambulation 2x100 feet with crutches - Sit to stand x 5 time with crutches - L knee extension from sitting 2x20 reps   - with red therapy band under sole of the foot to assist 10 degrees of increased extension 2x10 reps Cuing provided to improve forms and techniques with holding the band.  Extra resting breaks and transfer time required by patient between exercises due to tiredness.  - Education on HEP including  red theraband - circumference measurements to track progress and measurable effect of manual.  Manual therapy: to reduce pain and tissue tension, improve range of motion, neuromodulation, in order to promote improved ability to complete functional activities. - Patient in supine. Orthopedic edema massage over entire L LE including flat finger circles, scoop technique, and thumb circles to move fluid out of LE and improve compliance of soft tissue. Avoided tender regions and scabbed regions. Noted for significant softening of tissue at calf and lower leg. Ankle and foot remained quite  dense. Provided to decrease swelling to promote healing, decrease pain, and promote improved activation of quads.  - assistance provided to doff compression garment.     Patient tolerated the sessionwelland continue progression toward his goal. Patient continue demonstrate improvements in tissue density with edema massage, demonstrating improvement in swelling. Patient demonstrated mild improvement in quad activation but is still unable to reach full knee extension but made 10 degrees gain with self controled therapy band assisted L knee extension. Future sessions will continue to focus on strengthening, edema control, endurance, and increase of functional mobility tolerance.Patient would benefit from continued physical therapy to address self-care for her pain, strength, and endurance to improve functional mobility and ADLs tolerance.   PT Education - 04/26/19 1111    Education Details  Exercise purpose/form. Self management, and his current condition.    Person(s) Educated  Patient    Methods  Explanation;Demonstration;Tactile cues;Verbal cues    Comprehension  Verbalized understanding;Returned demonstration;Verbal cues required;Tactile cues required       PT Short Term Goals - 04/24/19 1426      PT SHORT TERM GOAL #1   Title  Be independent with initial home exercise program for self-management of symptoms.    Baseline  initial HEP provided at IE (04/18/2019);    Time  2    Period  Weeks    Status  Achieved    Target Date  05/02/19        PT Long Term Goals - 04/18/19 1927      PT LONG TERM GOAL #1   Title  Be independent with a long-term home exercise program for self-management of symptoms.    Baseline  initial HEP provided at IE (04/18/2019);    Time  12    Period  Weeks    Status  New    Target Date  07/11/19      PT LONG TERM GOAL #2   Title  Demonstrate improved FOTO score by 10 units to demonstrate improvement in overall condition and self-reported functional  ability.    Baseline  44 (04/18/2019);    Time  12    Period  Weeks    Status  New    Target Date  07/11/19      PT LONG TERM GOAL #3   Title  Complete community, work and/or recreational activities without limitation due to current condition.    Baseline  Difficulites with walking, standing, shopping, driving, cooking and bathing, currently NWB L LE (04/18/2019)    Time  12    Period  Weeks    Status  New    Target Date  07/11/19      PT LONG TERM GOAL #4   Title  Increase L knee flexion AROM to 140 degrees to be able to complete ADLs without limitation of knee flexion    Baseline  PROM 95 degrees of flexion with pain at end range and AROM 80 degrees(04/18/2019);    Time  12    Period  Weeks    Status  New    Target Date  07/11/19      PT LONG TERM GOAL #5   Title  Pt will increase LE strength to equal or greater than 4+/5 MMT in order to demonstrate improvement in strength and function.    Baseline  See objectives on IE(04/18/2019);    Time  12    Period  Weeks    Status  New    Target Date  07/11/19            Plan - 04/26/19 1112    Clinical Impression Statement  Patient tolerated the session well and continue progression toward his goal. Patient continue demonstrate improvements in tissue density with edema massage, demonstrating improvement in swelling. Patient demonstrated mild improvement in quad activation but is still unable to reach full knee extension but made 10 degrees gain with self controled therapy band assisted L knee extension. Future sessions will continue to focus on strengthening, edema control, endurance, and increase of functional mobility tolerance. Patient would benefit from continued physical therapy to address self-care for her pain, strength, and endurance to improve functional mobility and ADLs tolerance.    Personal Factors and Comorbidities  Comorbidity 2;Age    Comorbidities  L ankle surgery; hypertension; history of falling; L knee pain.     Examination-Activity Limitations  Bathing;Bed Mobility;Locomotion Level;Stairs;Stand;Hygiene/Grooming;Bend;Caring for Others;Sleep;Lift;Transfers;Toileting;Squat;Carry    Examination-Participation Restrictions  Yard Work;Driving;Meal Prep;Laundry;Shop;Cleaning    Stability/Clinical Decision Making  Evolving/Moderate complexity    Rehab Potential  Good    PT Frequency  2x / week    PT Duration  12 weeks    PT Treatment/Interventions  ADLs/Self Care Home Management;Cryotherapy;Moist Heat;Gait training;Stair training;Functional mobility training;Therapeutic activities;Therapeutic exercise;Balance training;Neuromuscular re-education;Manual techniques;Joint Manipulations;Passive range of motion;Dry needling;DME Instruction    PT Next Visit Plan  Strengthening, ROM and muscle endurance    PT Home Exercise Plan  [employer]: [email or web address]    Consulted and Agree with Plan of Care  Patient;Family member/caregiver    Family Member Consulted  Wife       Patient will benefit from skilled therapeutic intervention in order to improve the following deficits and impairments:  Abnormal gait, Decreased endurance, Decreased activity tolerance, Decreased range of motion, Decreased strength, Pain, Impaired flexibility, Difficulty walking, Decreased balance, Impaired perceived functional ability, Decreased knowledge of use of DME, Decreased mobility  Visit Diagnosis: Acute pain of left knee  Unsteadiness on feet  Muscle weakness (generalized)     Problem List There are no active problems to display for this patient.   Sherrilyn Rist, SPT 04/26/19, 2:14 PM  Everlean Alstrom. Graylon Good, PT, DPT 04/26/19, 2:20 PM  [facility] [address], [place], [address] Phone: [phone]   Fax:  [phone]  Name: Glenn Watson MRN: [id number] Date of Birth: 08/24/1945

## 2019-04-26 NOTE — Telephone Encounter (Addendum)
Called referring physician's office to clarify weight bearing precautions in pool environment. Spoke to [place] who waited for Dr. Marcelino Scot to ask him. Decided to call us back once he is available to answer. Explained patient would be at the pool with his wife, not in aquatic therapy.   Everlean Alstrom. Graylon Good, PT, DPT 04/26/19, 2:35 PM  Gwen from Dr. Marcelino Scot called back and relayed the message that he thought weight bearing in the water would be wonderful for the patient as long as he is chest deep in the water.   Everlean Alstrom. Graylon Good, PT, DPT 04/26/19, 5:20 PM  Called patient and spoke to him and his wife on speaker-phone. Relayed the message from Dr. Carlean Jews office that he thought weight bearing in the water would be wonderful as long as he is chest deep. Patient and his wife said they understood and would be able to manage that. They said he has an appointment to go to the pool tomorrow at [place] [facility]. Graylon Good, PT, DPT 04/26/19, 5:22 PM

## 2019-05-01 ENCOUNTER — Other Ambulatory Visit: Payer: Self-pay

## 2019-05-01 ENCOUNTER — Encounter: Payer: Self-pay | Admitting: Physical Therapy

## 2019-05-01 ENCOUNTER — Ambulatory Visit: Payer: PPO | Attending: Orthopedic Surgery | Admitting: Physical Therapy

## 2019-05-01 DIAGNOSIS — M25562 Pain in left knee: Secondary | ICD-10-CM | POA: Insufficient documentation

## 2019-05-01 DIAGNOSIS — R2681 Unsteadiness on feet: Secondary | ICD-10-CM

## 2019-05-01 DIAGNOSIS — M6281 Muscle weakness (generalized): Secondary | ICD-10-CM | POA: Diagnosis not present

## 2019-05-01 NOTE — Therapy (Signed)
[facility] [address] [place], [address] Phone: [phone]   Fax:  [phone]  Physical Therapy Treatment  Patient Details  Name: Glenn Watson MRN: [id number] Date of Birth: [birth year] Referring Provider (PT): Altamese , MD   Encounter Date: 05/01/2019  PT End of Session - 05/01/19 1606    Visit Number  5    Number of Visits  24    Date for PT Re-Evaluation  07/11/19    Authorization Type  [employer] reporting from 04/18/2019    Authorization - Visit Number  5    Authorization - Number of Visits  10    PT Start Time  [id number]    PT Stop Time  1430    PT Time Calculation (min)  45 min    Activity Tolerance  Patient tolerated treatment well    Behavior During Therapy  [facility] for tasks assessed/performed       Past Medical History:  Diagnosis Date  . Hypertension     Past Surgical History:  Procedure Laterality Date  . APPENDECTOMY  2011   Dr. Bary Castilla  . COLONOSCOPY WITH PROPOFOL N/A 02/10/2017   Procedure: COLONOSCOPY WITH PROPOFOL;  Surgeon: Christene Lye, MD;  Location: [facility] ENDOSCOPY;  Service: Endoscopy;  Laterality: N/A;    There were no vitals filed for this visit.  Subjective Assessment - 05/01/19 1406    Subjective  Patient reports no pain at L LE. He also did pool exercises with water go up to chest height. Patient didn't have pain at L LE and the edema/swelling has been doing better. Patient no recent falls and did HEP regularly.    Patient is accompained by:  Family member    Pertinent History  L ankle surgery; hypertension; history of falling; L knee pain.    Limitations  Lifting;Standing;Walking;House hold activities    Diagnostic tests  CT: Comminuted and impacted intra-articular fracture of the proximaltibia as described. This fracture involves both tibial plateaus andis associated with depression of the articular surface posteriorly. Mildly comminuted  intra-articular fracture of the fibular headand neck. 04/07/2019    Patient Stated Goals  Able to walk without AD    Currently in Pain?  No/denies    Pain Onset  1 to 4 weeks ago       Objective: L knee AROM extension -7 degrees to neutral  L knee AAROM with therapy band -5 degrees to neutral    L LE Circumferences (in cm):  L ankle 10cm proximal to heel 28 cm   Therapeutic exercise:to centralize symptoms and improve ROM, strength, muscular endurance, and activity tolerance required for successful completion of functional activities. - Ambulation 2x100 feet with RW and patient was 7/10 tired. SBA throughout the ambulation - Sit to stand x 5 time with rolling walker - L knee extension from sitting 3x10 reps 3 s hold              - with red therapy band 1x10 reps with 3 s hold   - with yellow therapy 1x10 reps with 3 s hold  - Supine straight leg raise 1x10 reps to the available range. - Supine short arc from a foam roller x 10 reps. Mild discomfort at lateral lower LE near fibular head but able to recover to baseline once out of position.  Cuing provided to improve forms and techniques.  Extra resting breaks and transfer time required by patient between exercises due to tiredness.  -  Education on HEP including red theraband   Patient tolerated the sessionwelland continue progress toward his goal. Patient demonstrated improve AROM L knee extension in sitting -17 degrees to neutral and -5 degrees to neutral with AAROM with yellow band. Patient also demonstrated improved quad activation with short arc on roller exercises. Future sessions will continue tofocus on strengthening,edema control,endurance, and increase of functional mobility tolerance.Patient would benefit from continued physical therapy to address self-care for her pain, strength, and endurance to improve functional mobility and ADLs tolerance.   PT Education - 05/01/19 1605    Education Details  Exercise  purpose/form. Self management    Person(s) Educated  Patient    Methods  Explanation;Demonstration;Tactile cues;Verbal cues    Comprehension  Verbalized understanding;Returned demonstration;Verbal cues required;Tactile cues required       PT Short Term Goals - 04/24/19 1426      PT SHORT TERM GOAL #1   Title  Be independent with initial home exercise program for self-management of symptoms.    Baseline  initial HEP provided at IE (04/18/2019);    Time  2    Period  Weeks    Status  Achieved    Target Date  05/02/19        PT Long Term Goals - 04/18/19 1927      PT LONG TERM GOAL #1   Title  Be independent with a long-term home exercise program for self-management of symptoms.    Baseline  initial HEP provided at IE (04/18/2019);    Time  12    Period  Weeks    Status  New    Target Date  07/11/19      PT LONG TERM GOAL #2   Title  Demonstrate improved FOTO score by 10 units to demonstrate improvement in overall condition and self-reported functional ability.    Baseline  44 (04/18/2019);    Time  12    Period  Weeks    Status  New    Target Date  07/11/19      PT LONG TERM GOAL #3   Title  Complete community, work and/or recreational activities without limitation due to current condition.    Baseline  Difficulites with walking, standing, shopping, driving, cooking and bathing, currently NWB L LE (04/18/2019)    Time  12    Period  Weeks    Status  New    Target Date  07/11/19      PT LONG TERM GOAL #4   Title  Increase L knee flexion AROM to 140 degrees to be able to complete ADLs without limitation of knee flexion    Baseline  PROM 95 degrees of flexion with pain at end range and AROM 80 degrees(04/18/2019);    Time  12    Period  Weeks    Status  New    Target Date  07/11/19      PT LONG TERM GOAL #5   Title  Pt will increase LE strength to equal or greater than 4+/5 MMT in order to demonstrate improvement in strength and function.    Baseline  See objectives  on IE(04/18/2019);    Time  12    Period  Weeks    Status  New    Target Date  07/11/19            Plan - 05/01/19 1428    Clinical Impression Statement  Patient tolerated the session well and continue progress toward his goal. Patient demonstrated improve AROM  L knee extension in sitting -17 degrees to neutral and -5 degrees to neutral with AAROM with yellow band. Patient also demonstrated improved quad activation with short arc on roller exercises. Future sessions will continue to focus on strengthening, edema control, endurance, and increase of functional mobility tolerance. Patient would benefit from continued physical therapy to address self-care for her pain, strength, and endurance to improve functional mobility and ADLs tolerance.    Personal Factors and Comorbidities  Comorbidity 2;Age    Comorbidities  L ankle surgery; hypertension; history of falling; L knee pain.    Examination-Activity Limitations  Bathing;Bed Mobility;Locomotion Level;Stairs;Stand;Hygiene/Grooming;Bend;Caring for Others;Sleep;Lift;Transfers;Toileting;Squat;Carry    Examination-Participation Restrictions  Yard Work;Driving;Meal Prep;Laundry;Shop;Cleaning    Stability/Clinical Decision Making  Evolving/Moderate complexity    Rehab Potential  Good    PT Frequency  2x / week    PT Duration  12 weeks    PT Treatment/Interventions  ADLs/Self Care Home Management;Cryotherapy;Moist Heat;Gait training;Stair training;Functional mobility training;Therapeutic activities;Therapeutic exercise;Balance training;Neuromuscular re-education;Manual techniques;Joint Manipulations;Passive range of motion;Dry needling;DME Instruction    PT Next Visit Plan  Strengthening, ROM and muscle endurance    PT Home Exercise Plan  [employer]: [email or web address]    Consulted and Agree with Plan of Care  Patient;Family member/caregiver    Family Member Consulted  Wife       Patient will benefit from skilled therapeutic intervention in order to  improve the following deficits and impairments:  Abnormal gait, Decreased endurance, Decreased activity tolerance, Decreased range of motion, Decreased strength, Pain, Impaired flexibility, Difficulty walking, Decreased balance, Impaired perceived functional ability, Decreased knowledge of use of DME, Decreased mobility  Visit Diagnosis: Acute pain of left knee  Unsteadiness on feet  Muscle weakness (generalized)     Problem List There are no active problems to display for this patient.   Sherrilyn Rist, SPT 05/02/19, 10:02 AM  Everlean Alstrom. Graylon Good, PT, DPT 05/02/19, 10:02 AM  [facility] [address] [place], [address] Phone: [phone]   Fax:  [phone]  Name: Glenn Watson MRN: [id number] Date of Birth: 06/21/1946

## 2019-05-04 ENCOUNTER — Ambulatory Visit: Payer: PPO | Admitting: Physical Therapy

## 2019-05-04 ENCOUNTER — Other Ambulatory Visit: Payer: Self-pay

## 2019-05-04 ENCOUNTER — Encounter: Payer: Self-pay | Admitting: Physical Therapy

## 2019-05-04 DIAGNOSIS — M25562 Pain in left knee: Secondary | ICD-10-CM | POA: Diagnosis not present

## 2019-05-04 DIAGNOSIS — R2681 Unsteadiness on feet: Secondary | ICD-10-CM

## 2019-05-04 DIAGNOSIS — M6281 Muscle weakness (generalized): Secondary | ICD-10-CM

## 2019-05-04 NOTE — Therapy (Signed)
[facility] [address], [place], [address] Phone: [phone]   Fax:  [phone]  Physical Therapy Treatment  Patient Details  Name: Glenn Watson MRN: [id number] Date of Birth: 10-Oct-1945 Referring Provider (PT): Altamese Wellington, MD   Encounter Date: 05/04/2019  PT End of Session - 05/04/19 1514    Visit Number  6    Number of Visits  24    Date for PT Re-Evaluation  07/11/19    Authorization Type  [employer] reporting from 04/18/2019    Authorization - Visit Number  6    Authorization - Number of Visits  10    PT Start Time  [id number]    PT Stop Time  1430    PT Time Calculation (min)  45 min    Equipment Utilized During Treatment  Gait belt    Activity Tolerance  Patient tolerated treatment well    Behavior During Therapy  [facility] for tasks assessed/performed       Past Medical History:  Diagnosis Date  . Hypertension     Past Surgical History:  Procedure Laterality Date  . APPENDECTOMY  2011   Dr. Bary Castilla  . COLONOSCOPY WITH PROPOFOL N/A 02/10/2017   Procedure: COLONOSCOPY WITH PROPOFOL;  Surgeon: Christene Lye, MD;  Location: [facility] ENDOSCOPY;  Service: Endoscopy;  Laterality: N/A;    There were no vitals filed for this visit.  Subjective Assessment - 05/04/19 1509    Subjective  Patient states he has been going to the pool to practice and no pain when rest. 3/10 pain when L knee extension at end range with the band assistance but able to recover to no pain once out of position. Patient will follow up with his physicion on coming Monday.    Patient is accompained by:  Family member    Pertinent History  L ankle surgery; hypertension; history of falling; L knee pain.    Limitations  Lifting;Standing;Walking;House hold activities    Diagnostic tests  CT: Comminuted and impacted intra-articular fracture of the proximaltibia as described. This fracture involves both tibial plateaus  andis associated with depression of the articular surface posteriorly. Mildly comminuted intra-articular fracture of the fibular headand neck. 04/07/2019    Patient Stated Goals  Able to walk without AD    Currently in Pain?  No/denies    Pain Score  0-No pain    Pain Onset  1 to 4 weeks ago        Objective: L knee AAROM with therapy band -5 degrees to neutral  Therapeutic exercise:to centralize symptoms and improve ROM, strength, muscular endurance, and activity tolerance required for successful completion of functional activities. - Ambulation 3x100 feet with RW and patient was 7/10 tired. SBA throughout the ambulation - Sit to stand x 5 time with rolling walker - L knee extension from sitting             - with yellow therapy 1x10 reps with 3 s hold  - Supine straight leg raise 3x10 reps to the available range. - Supine short arc from a foam roller 3x 10 reps. Mild to modrate discomfort at lateral lower LE near fibular head but able to recover to baseline once out of position.  - Supine AAROM L LE knee extension/short arc and controlled lowering down. x20 reps.  Cuing provided to improve forms and techniques.  Extra resting breaks and transfer time required by patient between exercises due to tiredness.  Patient tolerated the sessionwellandcontinue progress toward his goal. Patient demonstrated improved muscle endurance with functional activities (walking). Patient also presented improved muscle activation at end range quad contraction but still have significant lack of AROM for L knee extension to neural. Future sessions will continue tofocus on strengthening,edema control,endurance, and increase of functional mobility tolerance.Patient would benefit from continued physical therapy to address self-care for her pain, strength, and endurance to improve functional mobility and ADLs tolerance.   HOME EXERCISE PROGRAM  Access Code: [email or web address]     URL:  [email or web address]    Date: 04/18/2019  Prepared by: Rosita Kea   Exercises Supine Quad Set - 20 reps - 5-10 seconds hold - 2-3x daily - 7x weekly Supine Heel Slides - 2-3 sets - 20 reps - 1 seconds hold - 7x weekly Seated Long Arc Quad - 20 reps - 3 seconds hold - 2-3x daily - 7x weekly       PT Education - 05/04/19 1513    Education Details  Exercise purpose/form. Self management    Person(s) Educated  Patient    Methods  Explanation;Demonstration;Tactile cues;Verbal cues    Comprehension  Verbalized understanding;Returned demonstration;Tactile cues required;Verbal cues required       PT Short Term Goals - 04/24/19 1426      PT SHORT TERM GOAL #1   Title  Be independent with initial home exercise program for self-management of symptoms.    Baseline  initial HEP provided at IE (04/18/2019);    Time  2    Period  Weeks    Status  Achieved    Target Date  05/02/19        PT Long Term Goals - 04/18/19 1927      PT LONG TERM GOAL #1   Title  Be independent with a long-term home exercise program for self-management of symptoms.    Baseline  initial HEP provided at IE (04/18/2019);    Time  12    Period  Weeks    Status  New    Target Date  07/11/19      PT LONG TERM GOAL #2   Title  Demonstrate improved FOTO score by 10 units to demonstrate improvement in overall condition and self-reported functional ability.    Baseline  44 (04/18/2019);    Time  12    Period  Weeks    Status  New    Target Date  07/11/19      PT LONG TERM GOAL #3   Title  Complete community, work and/or recreational activities without limitation due to current condition.    Baseline  Difficulites with walking, standing, shopping, driving, cooking and bathing, currently NWB L LE (04/18/2019)    Time  12    Period  Weeks    Status  New    Target Date  07/11/19      PT LONG TERM GOAL #4   Title  Increase L knee flexion AROM to 140 degrees to be able to complete ADLs without  limitation of knee flexion    Baseline  PROM 95 degrees of flexion with pain at end range and AROM 80 degrees(04/18/2019);    Time  12    Period  Weeks    Status  New    Target Date  07/11/19      PT LONG TERM GOAL #5   Title  Pt will increase LE strength to equal or greater than 4+/5 MMT in order to demonstrate improvement in strength and function.  Baseline  See objectives on IE(04/18/2019);    Time  12    Period  Weeks    Status  New    Target Date  07/11/19            Plan - 05/04/19 1514    Clinical Impression Statement  Patient tolerated the session well and continue progress toward his goal. Patient demonstrated improved muscle endurance with functional activities (walking). Patient also presented improved muscle activation at end range quad contraction but still have significant lack of AROM for L knee extension to neural. Future sessions will continue to focus on strengthening, edema control, endurance, and increase of functional mobility tolerance. Patient would benefit from continued physical therapy to address self-care for her pain, strength, and endurance to improve functional mobility and ADLs tolerance.    Personal Factors and Comorbidities  Comorbidity 2;Age    Comorbidities  L ankle surgery; hypertension; history of falling; L knee pain.    Examination-Activity Limitations  Bathing;Bed Mobility;Locomotion Level;Stairs;Stand;Hygiene/Grooming;Bend;Caring for Others;Sleep;Lift;Transfers;Toileting;Squat;Carry    Examination-Participation Restrictions  Yard Work;Driving;Meal Prep;Laundry;Shop;Cleaning    Stability/Clinical Decision Making  Evolving/Moderate complexity    Rehab Potential  Good    PT Frequency  2x / week    PT Duration  12 weeks    PT Treatment/Interventions  ADLs/Self Care Home Management;Cryotherapy;Moist Heat;Gait training;Stair training;Functional mobility training;Therapeutic activities;Therapeutic exercise;Balance training;Neuromuscular  re-education;Manual techniques;Joint Manipulations;Passive range of motion;Dry needling;DME Instruction    PT Next Visit Plan  Strengthening, ROM and muscle endurance    PT Home Exercise Plan  Medbridge: [email or web address]    Consulted and Agree with Plan of Care  Patient;Family member/caregiver    Family Member Consulted  Wife       Patient will benefit from skilled therapeutic intervention in order to improve the following deficits and impairments:  Abnormal gait, Decreased endurance, Decreased activity tolerance, Decreased range of motion, Decreased strength, Pain, Impaired flexibility, Difficulty walking, Decreased balance, Impaired perceived functional ability, Decreased knowledge of use of DME, Decreased mobility  Visit Diagnosis: Acute pain of left knee  Unsteadiness on feet  Muscle weakness (generalized)     Problem List There are no active problems to display for this patient.   Sherrilyn Rist, SPT 05/04/19, 3:24 PM  Everlean Alstrom. Graylon Good, PT, DPT 05/04/19, 3:37 PM  [facility] [address] [place], [address] Phone: [phone]   Fax:  [phone]  Name: DHANVIN STORZ MRN: [id number] Date of Birth: 09-24-1945

## 2019-05-08 ENCOUNTER — Ambulatory Visit: Payer: PPO | Admitting: Physical Therapy

## 2019-05-08 DIAGNOSIS — S82222A Displaced transverse fracture of shaft of left tibia, initial encounter for closed fracture: Secondary | ICD-10-CM | POA: Diagnosis not present

## 2019-05-09 ENCOUNTER — Encounter: Payer: Self-pay | Admitting: Physical Therapy

## 2019-05-09 ENCOUNTER — Ambulatory Visit: Payer: PPO | Admitting: Physical Therapy

## 2019-05-09 ENCOUNTER — Other Ambulatory Visit: Payer: Self-pay

## 2019-05-09 DIAGNOSIS — M25562 Pain in left knee: Secondary | ICD-10-CM | POA: Diagnosis not present

## 2019-05-09 DIAGNOSIS — M6281 Muscle weakness (generalized): Secondary | ICD-10-CM

## 2019-05-09 DIAGNOSIS — R2681 Unsteadiness on feet: Secondary | ICD-10-CM

## 2019-05-10 NOTE — Therapy (Signed)
[facility] [facility] [address], [place], [address] Phone: [phone]   Fax:  [phone]  Physical Therapy Treatment  Patient Details  Name: Glenn Watson MRN: [id number] Date of Birth: [birth year] Referring Provider (PT): Altamese Burwell, MD   Encounter Date: 05/09/2019  PT End of Session - 05/10/19 0926    Visit Number  7    Number of Visits  24    Date for PT Re-Evaluation  07/11/19    Authorization Type  [employer] reporting from 04/18/2019    Authorization - Visit Number  7    Authorization - Number of Visits  10    PT Start Time  1300    PT Stop Time  1346    PT Time Calculation (min)  46 min    Equipment Utilized During Treatment  Gait belt    Activity Tolerance  Patient tolerated treatment well    Behavior During Therapy  [facility] for tasks assessed/performed       Past Medical History:  Diagnosis Date  . Hypertension     Past Surgical History:  Procedure Laterality Date  . APPENDECTOMY  2011   Dr. Bary Castilla  . COLONOSCOPY WITH PROPOFOL N/A 02/10/2017   Procedure: COLONOSCOPY WITH PROPOFOL;  Surgeon: Christene Lye, MD;  Location: [facility] ENDOSCOPY;  Service: Endoscopy;  Laterality: N/A;    There were no vitals filed for this visit.  Subjective Assessment - 05/09/19 1340    Subjective  Patient states he didn't have pain today and only feel muscle soreness at medial and lateral joint line. Patient also stated that his doctor prescribed 25% weight bearing on L LE with brace on (toe touch weight bearing). Patient also states he will continue aquatic exercises.    Pertinent History  L ankle surgery; hypertension; history of falling; L knee pain.    Limitations  Lifting;Standing;Walking;House hold activities    Diagnostic tests  CT: Comminuted and impacted intra-articular fracture of the proximaltibia as described. This fracture involves both tibial plateaus andis associated with depression  of the articular surface posteriorly. Mildly comminuted intra-articular fracture of the fibular headand neck. 04/07/2019    Patient Stated Goals  Able to walk without AD    Currently in Pain?  No/denies    Pain Onset  1 to 4 weeks ago         Objective: L knee AROM 20degrees lack to neutral.  Therapeutic exercise:to centralize symptoms and improve ROM, strength, muscular endurance, and activity tolerance required for successful completion of functional activities. - Ambulation 3x100 feet withRW.. SBA throughout the ambulation.  - Ambulation Toe touch weight bearing at L LE 4x50 feet. Cuing provided to improve LE stepping sequences, foot placement as well as proper body position with RW.  - Sit to stand x 3 time withrolling walker - L knee extension from sitting  - with yellow therapy band assist to achieve end range knee extension. 3x20 reps  - with L heel rested at big blue strike ball over green chair. Quad sets with heel lift and cuing to sitting upright without trunk leaning posterior.   Cuing provided to improve forms and techniques. Extra resting breaks and transfer time required by patient between exercises due to tiredness.   Patient tolerated the sessionwellandcontinue progress toward his goal.Patient demonstrated improved L LE quadriceps activation at end range and no increase of pain. Patient also presented improved LE sequencing of ambulation with rolling walker. Patient still requires assistant to achieve  end range L knee extension. Future sessions will continue tofocus on strengthening,edema control,endurance, and increase of functional mobility tolerance.Patient would benefit from continued physical therapy to address self-care for her pain, strength, and endurance to improve functional mobility and ADLs tolerance.   HOME EXERCISE PROGRAM Access Code: [email or web address] URL: [email or web address] Date: 04/18/2019  Prepared by: Rosita Kea    Exercises Supine Quad Set - 20 reps - 5-10 seconds hold - 2-3x daily - 7x weekly Supine Heel Slides - 2-3 sets - 20 reps - 1 seconds hold - 7x weekly Seated Long Arc Quad - 20 reps - 3 seconds hold - 2-3x daily - 7x weekly        PT Education - 05/10/19 0926    Education Details  Exercise purpose/form. Self management    Person(s) Educated  Patient    Methods  Explanation;Demonstration;Tactile cues;Verbal cues    Comprehension  Verbalized understanding;Returned demonstration;Tactile cues required;Verbal cues required       PT Short Term Goals - 04/24/19 1426      PT SHORT TERM GOAL #1   Title  Be independent with initial home exercise program for self-management of symptoms.    Baseline  initial HEP provided at IE (04/18/2019);    Time  2    Period  Weeks    Status  Achieved    Target Date  05/02/19        PT Long Term Goals - 04/18/19 1927      PT LONG TERM GOAL #1   Title  Be independent with a long-term home exercise program for self-management of symptoms.    Baseline  initial HEP provided at IE (04/18/2019);    Time  12    Period  Weeks    Status  New    Target Date  07/11/19      PT LONG TERM GOAL #2   Title  Demonstrate improved FOTO score by 10 units to demonstrate improvement in overall condition and self-reported functional ability.    Baseline  44 (04/18/2019);    Time  12    Period  Weeks    Status  New    Target Date  07/11/19      PT LONG TERM GOAL #3   Title  Complete community, work and/or recreational activities without limitation due to current condition.    Baseline  Difficulites with walking, standing, shopping, driving, cooking and bathing, currently NWB L LE (04/18/2019)    Time  12    Period  Weeks    Status  New    Target Date  07/11/19      PT LONG TERM GOAL #4   Title  Increase L knee flexion AROM to 140 degrees to be able to complete ADLs without limitation of knee flexion    Baseline  PROM 95 degrees of flexion with pain at  end range and AROM 80 degrees(04/18/2019);    Time  12    Period  Weeks    Status  New    Target Date  07/11/19      PT LONG TERM GOAL #5   Title  Pt will increase LE strength to equal or greater than 4+/5 MMT in order to demonstrate improvement in strength and function.    Baseline  See objectives on IE(04/18/2019);    Time  12    Period  Weeks    Status  New    Target Date  07/11/19  Plan - 05/10/19 0927    Clinical Impression Statement  Patient tolerated the session well and continue progress toward his goal. Patient demonstrated improved L LE quadriceps activation at end range and no increase of pain. Patient also presented improved LE sequencing of ambulation with rolling walker. Patient still requires assistant to achieve end range L knee extension. Future sessions will continue to focus on strengthening, edema control, endurance, and increase of functional mobility tolerance. Patient would benefit from continued physical therapy to address self-care for her pain, strength, and endurance to improve functional mobility and ADLs tolerance.    Personal Factors and Comorbidities  Comorbidity 2;Age    Comorbidities  L ankle surgery; hypertension; history of falling; L knee pain.    Examination-Activity Limitations  Bathing;Bed Mobility;Locomotion Level;Stairs;Stand;Hygiene/Grooming;Bend;Caring for Others;Sleep;Lift;Transfers;Toileting;Squat;Carry    Examination-Participation Restrictions  Yard Work;Driving;Meal Prep;Laundry;Shop;Cleaning    Stability/Clinical Decision Making  Evolving/Moderate complexity    Rehab Potential  Good    PT Frequency  2x / week    PT Duration  12 weeks    PT Treatment/Interventions  ADLs/Self Care Home Management;Cryotherapy;Moist Heat;Gait training;Stair training;Functional mobility training;Therapeutic activities;Therapeutic exercise;Balance training;Neuromuscular re-education;Manual techniques;Joint Manipulations;Passive range of motion;Dry  needling;DME Instruction    PT Next Visit Plan  Strengthening, ROM and muscle endurance    PT Home Exercise Plan  Medbridge: [email or web address]    Consulted and Agree with Plan of Care  Patient       Patient will benefit from skilled therapeutic intervention in order to improve the following deficits and impairments:  Abnormal gait, Decreased endurance, Decreased activity tolerance, Decreased range of motion, Decreased strength, Pain, Impaired flexibility, Difficulty walking, Decreased balance, Impaired perceived functional ability, Decreased knowledge of use of DME, Decreased mobility  Visit Diagnosis: Acute pain of left knee  Unsteadiness on feet  Muscle weakness (generalized)     Problem List There are no active problems to display for this patient.  Shelton Silvas PT, DPT Sherrilyn Rist, SPT 05/10/19, 12:48 PM  [facility] [address], [place], [address] Phone: [phone]   Fax:  [phone]  Name: Glenn Watson MRN: [id number] Date of Birth: 05/06/1946

## 2019-05-11 ENCOUNTER — Other Ambulatory Visit: Payer: Self-pay

## 2019-05-11 ENCOUNTER — Ambulatory Visit: Payer: PPO | Admitting: Physical Therapy

## 2019-05-11 ENCOUNTER — Encounter: Payer: Self-pay | Admitting: Physical Therapy

## 2019-05-11 DIAGNOSIS — M25562 Pain in left knee: Secondary | ICD-10-CM

## 2019-05-11 DIAGNOSIS — R2681 Unsteadiness on feet: Secondary | ICD-10-CM

## 2019-05-11 DIAGNOSIS — M6281 Muscle weakness (generalized): Secondary | ICD-10-CM

## 2019-05-11 NOTE — Therapy (Signed)
[facility] [address], [place], [address] Phone: [phone]   Fax:  [phone]  Physical Therapy Treatment  Patient Details  Name: Glenn Watson MRN: [id number] Date of Birth: 11/15/1945 Referring Provider (PT): Altamese [place], MD   Encounter Date: 05/11/2019  PT End of Session - 05/11/19 1526    Visit Number  8    Number of Visits  24    Date for PT Re-Evaluation  07/11/19    Authorization Type  [employer] reporting from 04/18/2019    Authorization - Visit Number  8    Authorization - Number of Visits  10    PT Start Time  1500    PT Stop Time  1546    PT Time Calculation (min)  46 min    Equipment Utilized During Treatment  Gait belt    Activity Tolerance  Patient tolerated treatment well    Behavior During Therapy  [facility] for tasks assessed/performed       Past Medical History:  Diagnosis Date  . Hypertension     Past Surgical History:  Procedure Laterality Date  . APPENDECTOMY  2011   Dr. Bary Castilla  . COLONOSCOPY WITH PROPOFOL N/A 02/10/2017   Procedure: COLONOSCOPY WITH PROPOFOL;  Surgeon: Christene Lye, MD;  Location: [facility]OSCOPY;  Service: Endoscopy;  Laterality: N/A;    There were no vitals filed for this visit.  Subjective Assessment - 05/11/19 1520    Subjective  Patient states 1/10 pain around L knee but only discomfort or muscle soreness and fatigue.    Pertinent History  L ankle surgery; hypertension; history of falling; L knee pain.    Limitations  Lifting;Standing;Walking;House hold activities    Diagnostic tests  CT: Comminuted and impacted intra-articular fracture of the proximaltibia as described. This fracture involves both tibial plateaus andis associated with depression of the articular surface posteriorly. Mildly comminuted intra-articular fracture of the fibular headand neck. 04/07/2019    Patient Stated Goals  Able to walk without AD    Currently  in Pain?  Yes    Pain Score  1     Pain Location  Knee    Pain Orientation  Left    Pain Onset  1 to 4 weeks ago       Objective: L knee AROM 17degrees lack to neutral.  Therapeutic exercise:to centralize symptoms and improve ROM, strength, muscular endurance, and activity tolerance required for successful completion of functional activities. - Ambulation 25% weight bearing at L LE 4x50 feet. Cuing provided to improve LE stepping sequences, foot placement as well as proper body position with RW.  - Sit to stand x 3 time withrolling walker - L knee extension from sitting             - with yellow therapy band assist to achieve end range knee extension. 3x1mins              - with L heel rested at big blue strike ball over green chair. Quad sets with heel lift and cuing to sitting upright without trunk leaning posterior. 3x50mins  Cuing provided to improve forms and techniques. - Standing L knee extension exercise. R LE elevated by stepping on weight plate to decrease tendency to WB too heavily on L LE. 2x10 reps. Regressed version of  TKE to improve quad activation and strength.   Patient tolerated the sessionwellandcontinue progress toward his goal. Patient demonstrated good heel to toe gait pattern  as well as maintain 25% weight bearing at L LE. Patient also demonstrated good understanding of given cues but requires more sessions to perform exercise independently. Patient also demonstrated improved AROM L knee extension with 3 degrees within session. Future sessions will continue tofocus on strengthening,edema control,endurance, and increase of functional mobility tolerance.Patient would benefit from continued physical therapy to address self-care for her pain, strength, and endurance to improve functional mobility and ADLs tolerance.      PT Education - 05/11/19 1525    Education Details  Exercise purpose/form. Self management    Person(s) Educated  Patient    Methods   Explanation;Demonstration;Tactile cues;Verbal cues    Comprehension  Verbalized understanding;Returned demonstration;Verbal cues required;Tactile cues required       PT Short Term Goals - 04/24/19 1426      PT SHORT TERM GOAL #1   Title  Be independent with initial home exercise program for self-management of symptoms.    Baseline  initial HEP provided at IE (04/18/2019);    Time  2    Period  Weeks    Status  Achieved    Target Date  05/02/19        PT Long Term Goals - 04/18/19 1927      PT LONG TERM GOAL #1   Title  Be independent with a long-term home exercise program for self-management of symptoms.    Baseline  initial HEP provided at IE (04/18/2019);    Time  12    Period  Weeks    Status  New    Target Date  07/11/19      PT LONG TERM GOAL #2   Title  Demonstrate improved FOTO score by 10 units to demonstrate improvement in overall condition and self-reported functional ability.    Baseline  44 (04/18/2019);    Time  12    Period  Weeks    Status  New    Target Date  07/11/19      PT LONG TERM GOAL #3   Title  Complete community, work and/or recreational activities without limitation due to current condition.    Baseline  Difficulites with walking, standing, shopping, driving, cooking and bathing, currently NWB L LE (04/18/2019)    Time  12    Period  Weeks    Status  New    Target Date  07/11/19      PT LONG TERM GOAL #4   Title  Increase L knee flexion AROM to 140 degrees to be able to complete ADLs without limitation of knee flexion    Baseline  PROM 95 degrees of flexion with pain at end range and AROM 80 degrees(04/18/2019);    Time  12    Period  Weeks    Status  New    Target Date  07/11/19      PT LONG TERM GOAL #5   Title  Pt will increase LE strength to equal or greater than 4+/5 MMT in order to demonstrate improvement in strength and function.    Baseline  See objectives on IE(04/18/2019);    Time  12    Period  Weeks    Status  New     Target Date  07/11/19            Plan - 05/11/19 1527    Clinical Impression Statement  Patient tolerated the session well and continue progress toward his goal. Patient demonstrated good heel to toe gait pattern as well as maintain 25% weight  bearing at L LE. Patient also demonstrated good understanding of given cues but requires more sessions to perform exercise independently. Patient also demonstrated improved AROM L knee extension with 3 degrees within session. Future sessions will continue to focus on strengthening, edema control, endurance, and increase of functional mobility tolerance. Patient would benefit from continued physical therapy to address self-care for her pain, strength, and endurance to improve functional mobility and ADLs tolerance.    Personal Factors and Comorbidities  Comorbidity 2;Age    Comorbidities  L ankle surgery; hypertension; history of falling; L knee pain.    Examination-Activity Limitations  Bathing;Bed Mobility;Locomotion Level;Stairs;Stand;Hygiene/Grooming;Bend;Caring for Others;Sleep;Lift;Transfers;Toileting;Squat;Carry    Examination-Participation Restrictions  Yard Work;Driving;Meal Prep;Laundry;Shop;Cleaning    Stability/Clinical Decision Making  Evolving/Moderate complexity    Rehab Potential  Good    PT Frequency  2x / week    PT Duration  12 weeks    PT Treatment/Interventions  ADLs/Self Care Home Management;Cryotherapy;Moist Heat;Gait training;Stair training;Functional mobility training;Therapeutic activities;Therapeutic exercise;Balance training;Neuromuscular re-education;Manual techniques;Joint Manipulations;Passive range of motion;Dry needling;DME Instruction    PT Next Visit Plan  Strengthening, ROM and muscle endurance    PT Home Exercise Plan  Medbridge: [email or web address]    Consulted and Agree with Plan of Care  Patient       Patient will benefit from skilled therapeutic intervention in order to improve the following deficits and impairments:   Abnormal gait, Decreased endurance, Decreased activity tolerance, Decreased range of motion, Decreased strength, Pain, Impaired flexibility, Difficulty walking, Decreased balance, Impaired perceived functional ability, Decreased knowledge of use of DME, Decreased mobility  Visit Diagnosis: Acute pain of left knee  Unsteadiness on feet  Muscle weakness (generalized)     Problem List There are no active problems to display for this patient.   Sherrilyn Rist, SPT 05/11/19, 3:45 PM  Everlean Alstrom. Graylon Good, PT, DPT 05/11/19, 6:57 PM  [facility] [address] [place], [address] Phone: [phone]   Fax:  [phone]  Name: Glenn Watson MRN: [id number] Date of Birth: 22-Jul-1945

## 2019-05-15 ENCOUNTER — Ambulatory Visit: Payer: PPO | Admitting: Physical Therapy

## 2019-05-15 DIAGNOSIS — S82222A Displaced transverse fracture of shaft of left tibia, initial encounter for closed fracture: Secondary | ICD-10-CM | POA: Diagnosis not present

## 2019-05-16 ENCOUNTER — Other Ambulatory Visit: Payer: Self-pay

## 2019-05-16 ENCOUNTER — Encounter: Payer: Self-pay | Admitting: Physical Therapy

## 2019-05-16 ENCOUNTER — Ambulatory Visit: Payer: PPO | Admitting: Physical Therapy

## 2019-05-16 DIAGNOSIS — M25562 Pain in left knee: Secondary | ICD-10-CM

## 2019-05-16 DIAGNOSIS — R2681 Unsteadiness on feet: Secondary | ICD-10-CM

## 2019-05-16 DIAGNOSIS — M6281 Muscle weakness (generalized): Secondary | ICD-10-CM

## 2019-05-16 NOTE — Therapy (Signed)
[facility] [address] [place], [address] Phone: [phone]   Fax:  [phone]  Physical Therapy Treatment  Patient Details  Name: Glenn Watson MRN: [id number] Date of Birth: [birth year] Referring Provider (PT): Altamese [place], MD   Encounter Date: 05/16/2019  PT End of Session - 05/16/19 1301    Visit Number  9    Number of Visits  24    Date for PT Re-Evaluation  07/11/19    Authorization Type  [employer] reporting from 04/18/2019    Authorization - Visit Number  9    Authorization - Number of Visits  10    PT Start Time  [id number]    PT Stop Time  1205    PT Time Calculation (min)  49 min    Equipment Utilized During Treatment  Gait belt    Activity Tolerance  Patient tolerated treatment well    Behavior During Therapy  [facility] for tasks assessed/performed       Past Medical History:  Diagnosis Date  . Hypertension     Past Surgical History:  Procedure Laterality Date  . APPENDECTOMY  2011   Dr. Bary Castilla  . COLONOSCOPY WITH PROPOFOL N/A 02/10/2017   Procedure: COLONOSCOPY WITH PROPOFOL;  Surgeon: Christene Lye, MD;  Location: [facility];  Service: Endoscopy;  Laterality: N/A;    There were no vitals filed for this visit.   Subjective Assessment - 05/16/19 1120    Subjective  Paitent reports he has no pain upon arrival today. He thinks he may have overdid a little in the pool yesterday when he was doing an exercise where he was swinging his R leg into abduction/adduction and later felt some soreness near the medial epicondyle when he puts his foot down while walking. States he saw Dr. Nadara Mustard yesterday after being in the pool for a regularly scheduled visit and the radiographs looked good. He is to continue no more than 25% weight bearing for 3 more weeks, then re-assess. Patient feels ready to try the recumbent bike today. States he felt fine after last treatment session and  his HEP is going well. He started practicing TKE in the pool 2x10.    Pertinent History  L ankle surgery; hypertension; history of falling; L knee pain.    Limitations  Lifting;Standing;Walking;House hold activities    Diagnostic tests  CT: Comminuted and impacted intra-articular fracture of the proximaltibia as described. This fracture involves both tibial plateaus andis associated with depression of the articular surface posteriorly. Mildly comminuted intra-articular fracture of the fibular headand neck. 04/07/2019    Patient Stated Goals  Able to walk without AD    Currently in Pain?  No/denies    Pain Onset  1 to 4 weeks ago      TREATMENT:   Therapeutic exercise:to centralize symptoms and improve ROM, strength, muscular endurance, and activity tolerance required for successful completion of functional activities.  - Recumbent Bike seat setting 15. For improved lower extremity ROM, muscular endurance, and activity tolerance; and to induce the analgesic effect of aerobic exercise, stimulate joint nutrition, and prepare body structures and systems for following interventions. x 6 minutes. Required standby assistance to get on/off. Instructions for technique provided.   (removed brace for remainder of session)  Standing at treadmill bar: - weight shift towards L LE with scale under L foot to help patient get a feel for what 25% weightbearing is. Estimated at 55#.  - Standing TKE with  BUE support on treadmill bar with up to 25% weightbearing on L LE with scale under L LE for biofeedback. 2x10 with yellow theraband.  - mini squat with 25% weightbearing on L LE (confirmed with scale) 2x10. Heavy cuing for quad activation.   Standing on R LE on 3/4-inch lift to prevent unwanted weightbearing on L LE. Unilateral UE support on treadmill bar. Focus on improving quad contraction of L LE during exercises (except hamstring curls).   - L hip flexion 2x10 - L hip adduction 3x10 - L hip extension x  10 - L hamstring curls 2x10 Extensive cuing and mirror feedback for improved form through posture, hip, and knee.  Supine with heels elevated:  - quad set at L knee to improve quad strength as well as stretch into more extension. X 10, added yellow theraband x 10. - quad set with SLR x 10 Extensive cuing for quad activation.  Prone  - L quad set lifting knee from table 2x10  strong tactile cuing at R hip to keep it down to decrease back discomfort and force quad to work at end range.   Seated - L knee extension from sitting 2x5 to test improved activation or not.   Bed mobility:  - Supine <> sit and rolling with supervision to change positions. With difficulty and occasional moments of pain.    HOME EXERCISE PROGRAM Access Code: [email or web address]  URL: [email or web address]  Date: 05/16/2019  Prepared by: Rosita Kea   Exercises  Supine Quad Set - 20 reps - 5-10 seconds hold - 2-3x daily - 7x weekly  Supine Heel Slides - 2-3 sets - 20 reps - 1 seconds hold - 7x weekly  Seated Long Arc Quad - 20 reps - 3 seconds hold - 2-3x daily - 7x weekly  Squat - 3 sets - 10 reps    PT Education - 05/16/19 1301    Education Details  exercise purpose/form. self management techniques. updated HEP for pool    Person(s) Educated  Patient    Methods  Explanation;Demonstration;Tactile cues;Verbal cues;Handout    Comprehension  Verbalized understanding;Returned demonstration;Verbal cues required;Tactile cues required;Need further instruction       PT Short Term Goals - 04/24/19 1426      PT SHORT TERM GOAL #1   Title  Be independent with initial home exercise program for self-management of symptoms.    Baseline  initial HEP provided at IE (04/18/2019);    Time  2    Period  Weeks    Status  Achieved    Target Date  05/02/19        PT Long Term Goals - 04/18/19 1927      PT LONG TERM GOAL #1   Title  Be independent with a long-term home exercise program for self-management of  symptoms.    Baseline  initial HEP provided at IE (04/18/2019);    Time  12    Period  Weeks    Status  New    Target Date  07/11/19      PT LONG TERM GOAL #2   Title  Demonstrate improved FOTO score by 10 units to demonstrate improvement in overall condition and self-reported functional ability.    Baseline  44 (04/18/2019);    Time  12    Period  Weeks    Status  New    Target Date  07/11/19      PT LONG TERM GOAL #3   Title  Complete community, work and/or recreational activities  without limitation due to current condition.    Baseline  Difficulites with walking, standing, shopping, driving, cooking and bathing, currently NWB L LE (04/18/2019)    Time  12    Period  Weeks    Status  New    Target Date  07/11/19      PT LONG TERM GOAL #4   Title  Increase L knee flexion AROM to 140 degrees to be able to complete ADLs without limitation of knee flexion    Baseline  PROM 95 degrees of flexion with pain at end range and AROM 80 degrees(04/18/2019);    Time  12    Period  Weeks    Status  New    Target Date  07/11/19      PT LONG TERM GOAL #5   Title  Pt will increase LE strength to equal or greater than 4+/5 MMT in order to demonstrate improvement in strength and function.    Baseline  See objectives on IE(04/18/2019);    Time  12    Period  Weeks    Status  New    Target Date  07/11/19            Plan - 05/17/19 0954    Clinical Impression Statement  Pt tolerated treatment well. Pt was able to complete all exercises with minimal to no lasting increase in pain or discomfort. Patient able to use recumbent bike successfully today. Patient continues to have difficulty with end range extension activation but shows within visit improvements with varied techniques aimed at improving activation. Updated HEP to include squats in the pool. Would benefit from further HEP updates next session as well as more interventions for ROM. Pt required multimodal cuing for proper technique  and to facilitate improved neuromuscular control, strength, range of motion, and functional ability resulting in improved performance and form. Patient would benefit from continued physical therapy to address remaining impairments and functional limitations to work towards stated goals and return to PLOF or maximal functional independence.    Personal Factors and Comorbidities  Comorbidity 2;Age    Comorbidities  L ankle surgery; hypertension; history of falling; L knee pain.    Examination-Activity Limitations  Bathing;Bed Mobility;Locomotion Level;Stairs;Stand;Hygiene/Grooming;Bend;Caring for Others;Sleep;Lift;Transfers;Toileting;Squat;Carry    Examination-Participation Restrictions  Yard Work;Driving;Meal Prep;Laundry;Shop;Cleaning    Stability/Clinical Decision Making  Evolving/Moderate complexity    Rehab Potential  Good    PT Frequency  2x / week    PT Duration  12 weeks    PT Treatment/Interventions  ADLs/Self Care Home Management;Cryotherapy;Moist Heat;Gait training;Stair training;Functional mobility training;Therapeutic activities;Therapeutic exercise;Balance training;Neuromuscular re-education;Manual techniques;Joint Manipulations;Passive range of motion;Dry needling;DME Instruction    PT Next Visit Plan  Strengthening, ROM and muscle endurance    PT Home Exercise Plan  Medbridge: [email or web address]    Consulted and Agree with Plan of Care  Patient       Patient will benefit from skilled therapeutic intervention in order to improve the following deficits and impairments:  Abnormal gait, Decreased endurance, Decreased activity tolerance, Decreased range of motion, Decreased strength, Pain, Impaired flexibility, Difficulty walking, Decreased balance, Impaired perceived functional ability, Decreased knowledge of use of DME, Decreased mobility  Visit Diagnosis: Acute pain of left knee  Unsteadiness on feet  Muscle weakness (generalized)     Problem List There are no active problems to  display for this patient.   Everlean Alstrom. Graylon Good, PT, DPT 05/17/19, 9:55 AM  [facility] [address], [place],  [place] Phone: [phone]   Fax:  [phone]  Name: Glenn Watson MRN: [id number] Date of Birth: 03-30-1946

## 2019-05-17 ENCOUNTER — Encounter: Payer: PPO | Admitting: Physical Therapy

## 2019-05-18 ENCOUNTER — Ambulatory Visit: Payer: PPO | Admitting: Physical Therapy

## 2019-05-18 ENCOUNTER — Other Ambulatory Visit: Payer: Self-pay

## 2019-05-18 DIAGNOSIS — M25562 Pain in left knee: Secondary | ICD-10-CM

## 2019-05-18 DIAGNOSIS — M6281 Muscle weakness (generalized): Secondary | ICD-10-CM

## 2019-05-18 DIAGNOSIS — R2681 Unsteadiness on feet: Secondary | ICD-10-CM

## 2019-05-18 NOTE — Therapy (Signed)
[facility] [address], [place], [address] Phone: [phone]   Fax:  [phone]  Physical Therapy Treatment / Progress Note Reporting Period: 04/18/2019 - 05/18/2019  Patient Details  Name: Glenn Watson MRN: [id number] Date of Birth: [birth year] Referring Provider (PT): Altamese [place], MD   Encounter Date: 05/18/2019  PT End of Session - 05/18/19 1926    Visit Number  10    Number of Visits  24    Date for PT Re-Evaluation  07/11/19    Authorization Type  [employer] reporting from 04/18/2019    Authorization - Visit Number  10    Authorization - Number of Visits  10    PT Start Time  0945    PT Stop Time  1030    PT Time Calculation (min)  45 min    Equipment Utilized During Treatment  --    Activity Tolerance  Patient tolerated treatment well    Behavior During Therapy  [facility] for tasks assessed/performed       Past Medical History:  Diagnosis Date  . Hypertension     Past Surgical History:  Procedure Laterality Date  . APPENDECTOMY  2011   Dr. Bary Castilla  . COLONOSCOPY WITH PROPOFOL N/A 02/10/2017   Procedure: COLONOSCOPY WITH PROPOFOL;  Surgeon: Christene Lye, MD;  Location: [facility] ENDOSCOPY;  Service: Endoscopy;  Laterality: N/A;    There were no vitals filed for this visit.  Subjective Assessment - 05/18/19 1924    Subjective  Patient reports no pain upon arrival. He states he feels like he is benefitting from physical therapy and has noticed everything getting a bit easier as time goes on. For example, he can now cross his L leg over his right to get his shoe on. He reports he has been doing exercises in the pool twice a week and feels it is very benficial to him. He tried squats and single leg stance exercises yesterday with good results. He continues to be very limited by only 25% weight bearing on the left LE and is looking forward to advancing his weight bearing  precautions in the future. He agrees that continuing physical therapy is desirable.    Pertinent History  L ankle surgery; hypertension; history of falling; L knee pain.    Limitations  Lifting;Standing;Walking;House hold activities    Diagnostic tests  CT: Comminuted and impacted intra-articular fracture of the proximaltibia as described. This fracture involves both tibial plateaus andis associated with depression of the articular surface posteriorly. Mildly comminuted intra-articular fracture of the fibular headand neck. 04/07/2019    Patient Stated Goals  Able to walk without AD    Currently in Pain?  No/denies    Pain Onset  1 to 4 weeks ago         [facility] PT Assessment - 05/18/19 0001      Assessment   Medical Diagnosis   Tib/Fib Fx L leg    Referring Provider (PT)  Altamese [place], MD    Onset Date/Surgical Date  03/27/19    Hand Dominance  Right    Next MD Visit  04/26/2019      Precautions   Required Braces or Orthoses  Knee Immobilizer - Left   when weight bearing     Restrictions   Weight Bearing Restrictions  Yes    LLE Weight Bearing  Partial weight bearing    LLE Partial Weight Bearing Percentage or Pounds  25%  Balance Screen   Has the patient fallen in the past 6 months  Yes    How many times?  1    Has the patient had a decrease in activity level because of a fear of falling?   Yes    Is the patient reluctant to leave their home because of a fear of falling?   Yes      [place] residence    Living Arrangements  Spouse/significant other    Available Help at Discharge  Family    Type of [place] to enter;Level entry    [place]  Two level    Alternate Level Stairs-Number of Steps  --   [place] - 2 wheels;Crutches      Prior Function   Level of Independence  Independent    Vocation  Retired    Leisure  [employer], [place],  reading      Cognition   Overall Cognitive Status  Within Functional Limits for tasks assessed      Observation/Other Assessments   Observations  see note from 05/18/2019 for latest objective data    Focus on Therapeutic Outcomes (FOTO)   FOTO = 52 (05/18/2019)      Observation/Other Assessments-Edema    Edema  --       OBJECTIVE  FOTO = 52  MUSCULOSKELETAL: Tremor: Absent Bulk: significant L LE swelling knee to ankle, improved per observation and ability to get 20/30 compression stocking on (only able to 15/20 at IE) LE circumference (10cm proximal from heel):  L LE: 28.5cm   R LE: 26 cm   Posture No gross abnormalities noted in standing or seated posture.  Gait Ambulated with rolling walker and < 25% bearing at L LE  Transfers:  - sit <> stand from chair and elevated plinth with mod I using RW  Bed mobility:   - rolling and supine <> sit with mod I (additional time and planning).    Strength R/L (L LE = deferred manually resisted motions at knee, ankle, and hip rotation due to post-fx precautions)   3+/3 Hip abduction  5/4* Hip extension 5/2-    R Knee extension; L knee deferred due to pain 5/2    R Knee flexion; L knee deferred due to pain  *indicates pain  AROM   AAROM (on bike with careful self overpressure) L knee = 107 prior to end range stretching, 110 =  After end range stretching        Extension with heels elevated: L = 0 degrees Extension seated on edge of bed: lacking 22 degrees.   Extension: WFL bilaterally  *indicates pain   Objective measurements completed on examination: See above findings.    TREATMENT:   Therapeutic exercise:to centralize symptoms and improve ROM, strength, muscular endurance, and activity tolerance required for successful completion of functional activities.  (removed brace for duration of session)  - Recumbent Bike seat setting 15. For improved lower extremity ROM, muscular endurance, and activity  tolerance; and to induce the analgesic effect of aerobic exercise, stimulate joint nutrition, and prepare body structures and systems for following interventions. x 10 minutes. Required standby assistance to get on/off. Instructions for technique provided.    - Measurements to assess progress (see above)  - quad set at L knee to improve quad strength as well as stretch into  more extension. Into yellow theraband x 10, 4 second holds - seated long arc quad 3x10 with 5# ankle weight on L LE. Cuing for strong quad contraction.   Time to complete FOTO (5 min unbilled)   HOME EXERCISE PROGRAM Access Code: [email or web address]  URL: [email or web address]  Date: 05/16/2019  Prepared by: Rosita Kea   Exercises  Supine Quad Set - 20 reps - 5-10 seconds hold - 2-3x daily - 7x weekly  Supine Heel Slides - 2-3 sets - 20 reps - 1 seconds hold - 7x weekly  Seated Long Arc Quad - 20 reps - 3 seconds hold - 2-3x daily - 7x weekly  Squat - 3 sets - 10 reps    PT Education - 05/18/19 1926    Education Details  exercise purpose/form. self management techniques. reveiwed HEP for pool. discussed POC    Person(s) Educated  Patient    Methods  Explanation;Demonstration;Tactile cues;Verbal cues    Comprehension  Verbalized understanding;Returned demonstration;Verbal cues required;Tactile cues required;Need further instruction       PT Short Term Goals - 04/24/19 1426      PT SHORT TERM GOAL #1   Title  Be independent with initial home exercise program for self-management of symptoms.    Baseline  initial HEP provided at IE (04/18/2019);    Time  2    Period  Weeks    Status  Achieved    Target Date  05/02/19        PT Long Term Goals - 05/18/19 1936      PT LONG TERM GOAL #1   Title  Be independent with a long-term home exercise program for self-management of symptoms.    Baseline  initial HEP provided at IE (04/18/2019); continues to participate in currently appropriate HEP  (05/18/2019);    Time  12    Period  Weeks    Status  Partially Met    Target Date  07/11/19      PT LONG TERM GOAL #2   Title  Demonstrate improved FOTO score by 10 units to demonstrate improvement in overall condition and self-reported functional ability.    Baseline  44 (04/18/2019); 52 (05/18/2019)    Time  12    Period  Weeks    Status  Partially Met    Target Date  07/11/19      PT LONG TERM GOAL #3   Title  Complete community, work and/or recreational activities without limitation due to current condition.    Baseline  Difficulites with walking, standing, shopping, driving, cooking and bathing, currently NWB L LE (04/18/2019); continues with similar difficulties but is now PWB25% and is able to drive, cross legs to don shoes (05/18/2019);    Time  12    Period  Weeks    Status  Partially Met    Target Date  07/11/19      PT LONG TERM GOAL #4   Title  Increase L knee flexion AROM to 140 degrees to be able to complete ADLs without limitation of knee flexion    Baseline  PROM 95 degrees of flexion with pain at end range and AROM 80 degrees(04/18/2019); AAROM 110 with end range pain (05/18/2019);    Time  12    Period  Weeks    Status  Partially Met    Target Date  07/11/19      PT LONG TERM GOAL #5   Title  Pt will increase LE strength to equal or greater than 4+/5 MMT  in order to demonstrate improvement in strength and function.    Baseline  See objectives on IE(04/18/2019); still 2/10 L knee extension (lacking last 22 degrees against gravity) (05/18/2019);    Time  12    Period  Weeks    Status  Partially Met    Target Date  07/11/19            Plan - 05/18/19 1943    Clinical Impression Statement  Patient has completed 10 physical therapy treatment sessions and continues to make steady progress towards goals. Objectively, he has improved in ROM, strength, self-management techniques, activity tolerance, ambulation and bed mobility. Subjectively, patient reports  decreased difficulty with daily activities, improved confidence, decreased pain with more activity. FOTO score has improved from 44 to 52 reflecting improved self-reported function. Patient continues to have weight bearing restrictions, ROM, strength, proprioception, balance, and activity tolerance impairments that are limiting his ability to complete ADLs, IADLs, and basic household and community mobility or perform activities a previous functional level. Patient would benefit from continued physical therapy as outlined in initial POC to address remaining impairments and functional limitations to work towards stated goals and return to PLOF or maximal functional independence.    Personal Factors and Comorbidities  Comorbidity 2;Age    Comorbidities  L ankle surgery; hypertension; history of falling; L knee pain.    Examination-Activity Limitations  Bathing;Bed Mobility;Locomotion Level;Stairs;Stand;Hygiene/Grooming;Bend;Caring for Others;Sleep;Lift;Transfers;Toileting;Squat;Carry    Examination-Participation Restrictions  Yard Work;Driving;Meal Prep;Laundry;Shop;Cleaning    Stability/Clinical Decision Making  Evolving/Moderate complexity    Rehab Potential  Good    PT Frequency  2x / week    PT Duration  12 weeks    PT Treatment/Interventions  ADLs/Self Care Home Management;Cryotherapy;Moist Heat;Gait training;Stair training;Functional mobility training;Therapeutic activities;Therapeutic exercise;Balance training;Neuromuscular re-education;Manual techniques;Joint Manipulations;Passive range of motion;Dry needling;DME Instruction    PT Next Visit Plan  Strengthening, ROM and muscle endurance    PT Home Exercise Plan  Medbridge: [email or web address]    Consulted and Agree with Plan of Care  Patient       Patient will benefit from skilled therapeutic intervention in order to improve the following deficits and impairments:  Abnormal gait, Decreased endurance, Decreased activity tolerance, Decreased range of motion,  Decreased strength, Pain, Impaired flexibility, Difficulty walking, Decreased balance, Impaired perceived functional ability, Decreased knowledge of use of DME, Decreased mobility  Visit Diagnosis: Acute pain of left knee  Unsteadiness on feet  Muscle weakness (generalized)     Problem List There are no active problems to display for this patient.   Everlean Alstrom. Graylon Good, PT, DPT 05/18/19, 7:43 PM  [facility] [address], [place], [address] Phone: [phone]   Fax:  [phone]  Name: BERTIE SIMIEN MRN: [id number] Date of Birth: Dec 14, 1945

## 2019-05-22 ENCOUNTER — Other Ambulatory Visit: Payer: Self-pay

## 2019-05-22 ENCOUNTER — Encounter: Payer: Self-pay | Admitting: Physical Therapy

## 2019-05-22 ENCOUNTER — Ambulatory Visit: Payer: PPO | Admitting: Physical Therapy

## 2019-05-22 DIAGNOSIS — M25562 Pain in left knee: Secondary | ICD-10-CM

## 2019-05-22 DIAGNOSIS — M6281 Muscle weakness (generalized): Secondary | ICD-10-CM

## 2019-05-22 DIAGNOSIS — R2681 Unsteadiness on feet: Secondary | ICD-10-CM

## 2019-05-22 NOTE — Therapy (Signed)
[facility] [address] [place], [address] Phone: [phone]   Fax:  [phone]  Physical Therapy Treatment  Patient Details  Name: Glenn Watson MRN: [id number] Date of Birth: 10-26-1945 Referring Provider (PT): Altamese [place], MD   Encounter Date: 05/22/2019  PT End of Session - 05/22/19 1643    Visit Number  11    Number of Visits  24    Date for PT Re-Evaluation  07/11/19    Authorization Type  [employer] reporting from 05/22/2019    Authorization - Visit Number  1    Authorization - Number of Visits  10    PT Start Time  1350    PT Stop Time  1430    PT Time Calculation (min)  40 min    Activity Tolerance  Patient tolerated treatment well    Behavior During Therapy  [facility] for tasks assessed/performed       Past Medical History:  Diagnosis Date  . Hypertension     Past Surgical History:  Procedure Laterality Date  . APPENDECTOMY  2011   Dr. Bary Castilla  . COLONOSCOPY WITH PROPOFOL N/A 02/10/2017   Procedure: COLONOSCOPY WITH PROPOFOL;  Surgeon: Christene Lye, MD;  Location: [facility] ENDOSCOPY;  Service: Endoscopy;  Laterality: N/A;    There were no vitals filed for this visit.  Subjective Assessment - 05/22/19 1355    Subjective  Patient reports he is feeling well today with no pain upon arrival. He states that he felt fine after last treatment session as far as he remembers. He states he worked really hard in the pool on some L  weight bearing with straight leg while moving R leg in chest deep water and had some significant soreness in the posterior L knee later that day and the day following but it is better now. Notices that his left leg appears more bowed than R but when put them next to each other they seem similar to him. He later remembered that his physician thought his L LE may end up a bit shorter than his R due to not having surgery on his injury.    Pertinent History   L ankle surgery; hypertension; history of falling; L knee pain.    Limitations  Lifting;Standing;Walking;House hold activities    Diagnostic tests  CT: Comminuted and impacted intra-articular fracture of the proximaltibia as described. This fracture involves both tibial plateaus andis associated with depression of the articular surface posteriorly. Mildly comminuted intra-articular fracture of the fibular headand neck. 04/07/2019    Patient Stated Goals  Able to walk without AD    Currently in Pain?  No/denies    Pain Onset  1 to 4 weeks ago       TREATMENT:  Therapeutic exercise:to centralize symptoms and improve ROM, strength, muscular endurance, and activity tolerance required for successful completion of functional activities.  (removed brace for duration of session)  -Recumbent Bikeseat setting 15. For improved lower extremity ROM, muscular endurance, and activity tolerance; and to induce the analgesic effect of aerobic exercise, stimulate joint nutrition, and prepare body structures and systems for following interventions, freely rotating x5 minutes then moved seat up to 10 and went back and forth in available ROM with mild flexion overpressure to improve flexion. Requiredstandbyassistance to get on/off. Instructions for technique provided.112 degrees AAROM at conclusion of exercise.    - quad set at L knee to improve quad strength as well as stretch into more  extension. Into yellow theraband x 10, 4 second holds - prone quad set 3x10, cuing to keep hip down and use quad to move thigh.  - sidelying hip abduction with slight forward rotation of trunk and extensive cuing to move R leg into abduction and slight extension. Very weak and poor endurance noted. 3x10  - Sidlying R clam shell 2x20, noted for significant fatigue.  - seated long arc quad 3x10 with 5# ankle weight on L LE. Cuing for strong quad contraction.   Cuing throughout for proper form, strong quad contractoin,  monitoring for pain.   - Education on HEP including handout    HOME EXERCISE PROGRAM Access Code: [email or web address]  URL: [email or web address]  Date: 05/22/2019  Prepared by: Rosita Kea   Exercises  Supine Heel Slide with Strap - 1 sets - 20 reps - 3 seconds hold - 1x daily - 7x weekly  Active Straight Leg Raise with Quad Set - 3 sets - 10 reps - 1 second hold - 1x daily - 7x weekly  Clam Shells in Side Lying - 30 reps - 1-3 seconds hold - 1x daily - 1x weekly  Seated Long Arc Quad - 60 reps - 3 seconds hold - 2x daily - 7x weekly  Squat - 3 sets - 10 reps    PT Education - 05/22/19 1643    Education Details  exercise purpose/form. self management techniques. Updated HEP    Person(s) Educated  Patient    Methods  Explanation;Demonstration;Tactile cues;Verbal cues;Handout    Comprehension  Verbalized understanding;Returned demonstration;Verbal cues required;Need further instruction;Tactile cues required       PT Short Term Goals - 04/24/19 1426      PT SHORT TERM GOAL #1   Title  Be independent with initial home exercise program for self-management of symptoms.    Baseline  initial HEP provided at IE (04/18/2019);    Time  2    Period  Weeks    Status  Achieved    Target Date  05/02/19        PT Long Term Goals - 05/18/19 1936      PT LONG TERM GOAL #1   Title  Be independent with a long-term home exercise program for self-management of symptoms.    Baseline  initial HEP provided at IE (04/18/2019); continues to participate in currently appropriate HEP (05/18/2019);    Time  12    Period  Weeks    Status  Partially Met    Target Date  07/11/19      PT LONG TERM GOAL #2   Title  Demonstrate improved FOTO score by 10 units to demonstrate improvement in overall condition and self-reported functional ability.    Baseline  44 (04/18/2019); 52 (05/18/2019)    Time  12    Period  Weeks    Status  Partially Met    Target Date  07/11/19      PT LONG TERM GOAL  #3   Title  Complete community, work and/or recreational activities without limitation due to current condition.    Baseline  Difficulites with walking, standing, shopping, driving, cooking and bathing, currently NWB L LE (04/18/2019); continues with similar difficulties but is now PWB25% and is able to drive, cross legs to don shoes (05/18/2019);    Time  12    Period  Weeks    Status  Partially Met    Target Date  07/11/19      PT LONG TERM GOAL #4   Title  Increase L knee flexion AROM to 140 degrees to be able to complete ADLs without limitation of knee flexion    Baseline  PROM 95 degrees of flexion with pain at end range and AROM 80 degrees(04/18/2019); AAROM 110 with end range pain (05/18/2019);    Time  12    Period  Weeks    Status  Partially Met    Target Date  07/11/19      PT LONG TERM GOAL #5   Title  Pt will increase LE strength to equal or greater than 4+/5 MMT in order to demonstrate improvement in strength and function.    Baseline  See objectives on IE(04/18/2019); still 2/10 L knee extension (lacking last 22 degrees against gravity) (05/18/2019);    Time  12    Period  Weeks    Status  Partially Met    Target Date  07/11/19            Plan - 05/22/19 1648    Clinical Impression Statement  Pt tolerated treatment well and continues to make progress towards goals.  Pt was able to complete all exercises with minimal to no lasting increase in pain or discomfort. Noted for improved flexion ROM. Introduced more hip strengthening in preparation for increased weight bearing. Continues to struggle with end range knee extension.  Pt required multimodal cuing for proper technique and to facilitate improved neuromuscular control, strength, range of motion, and functional ability resulting in improved performance and form. Patient would benefit from continued physical therapy to address remaining impairments and functional limitations to work towards stated goals and return to  PLOF or maximal functional independence.    Personal Factors and Comorbidities  Comorbidity 2;Age    Comorbidities  L ankle surgery; hypertension; history of falling; L knee pain.    Examination-Activity Limitations  Bathing;Bed Mobility;Locomotion Level;Stairs;Stand;Hygiene/Grooming;Bend;Caring for Others;Sleep;Lift;Transfers;Toileting;Squat;Carry    Examination-Participation Restrictions  Yard Work;Driving;Meal Prep;Laundry;Shop;Cleaning    Stability/Clinical Decision Making  Evolving/Moderate complexity    Rehab Potential  Good    PT Frequency  2x / week    PT Duration  12 weeks    PT Treatment/Interventions  ADLs/Self Care Home Management;Cryotherapy;Moist Heat;Gait training;Stair training;Functional mobility training;Therapeutic activities;Therapeutic exercise;Balance training;Neuromuscular re-education;Manual techniques;Joint Manipulations;Passive range of motion;Dry needling;DME Instruction    PT Next Visit Plan  Strengthening, ROM and muscle endurance    PT Home Exercise Plan  Medbridge: [email or web address]    Consulted and Agree with Plan of Care  Patient       Patient will benefit from skilled therapeutic intervention in order to improve the following deficits and impairments:  Abnormal gait, Decreased endurance, Decreased activity tolerance, Decreased range of motion, Decreased strength, Pain, Impaired flexibility, Difficulty walking, Decreased balance, Impaired perceived functional ability, Decreased knowledge of use of DME, Decreased mobility  Visit Diagnosis: Acute pain of left knee  Unsteadiness on feet  Muscle weakness (generalized)     Problem List There are no active problems to display for this patient.   Everlean Alstrom. Graylon Good, PT, DPT 05/22/19, 4:49 PM  [facility] [address], [place], [address] Phone: [phone]   Fax:  [phone]  Name: Glenn Watson MRN: [id number] Date of Birth:  Oct 15, 1945

## 2019-05-24 ENCOUNTER — Encounter: Payer: Self-pay | Admitting: Physical Therapy

## 2019-05-24 ENCOUNTER — Ambulatory Visit: Payer: PPO | Admitting: Physical Therapy

## 2019-05-24 ENCOUNTER — Other Ambulatory Visit: Payer: Self-pay

## 2019-05-24 DIAGNOSIS — M25562 Pain in left knee: Secondary | ICD-10-CM

## 2019-05-24 DIAGNOSIS — M6281 Muscle weakness (generalized): Secondary | ICD-10-CM

## 2019-05-24 DIAGNOSIS — R2681 Unsteadiness on feet: Secondary | ICD-10-CM

## 2019-05-24 NOTE — Therapy (Signed)
[facility] [address] [place], [address] Phone: [phone]   Fax:  [phone]  Physical Therapy Treatment  Patient Details  Name: Glenn Watson MRN: [id number] Date of Birth: 02/18/1946 Referring Provider (PT): Altamese Ouachita, MD   Encounter Date: 05/24/2019  PT End of Session - 05/24/19 1354    Visit Number  12    Number of Visits  24    Date for PT Re-Evaluation  07/11/19    Authorization Type  [employer] reporting from 05/22/2019    Authorization - Visit Number  2    Authorization - Number of Visits  10    PT Start Time  1350    PT Stop Time  1430    PT Time Calculation (min)  40 min    Activity Tolerance  Patient tolerated treatment well    Behavior During Therapy  [facility] for tasks assessed/performed       Past Medical History:  Diagnosis Date   Hypertension     Past Surgical History:  Procedure Laterality Date   APPENDECTOMY  2011   Dr. Bary Castilla   COLONOSCOPY WITH PROPOFOL N/A 02/10/2017   Procedure: COLONOSCOPY WITH PROPOFOL;  Surgeon: Christene Lye, MD;  Location: [facility] ENDOSCOPY;  Service: Endoscopy;  Laterality: N/A;    There were no vitals filed for this visit.  Subjective Assessment - 05/24/19 1353    Subjective  Patient reports he is feeling well today and has no pain upon arrival. He reports he didn't get to go to the pool yesterday but has been doing his HEP without problems. Was a bit sore following last treatment session but not as much as he expected to be.    Pertinent History  L ankle surgery; hypertension; history of falling; L knee pain.    Limitations  Lifting;Standing;Walking;House hold activities    Diagnostic tests  CT: Comminuted and impacted intra-articular fracture of the proximaltibia as described. This fracture involves both tibial plateaus andis associated with depression of the articular surface posteriorly. Mildly comminuted intra-articular  fracture of the fibular headand neck. 04/07/2019    Patient Stated Goals  Able to walk without AD    Currently in Pain?  No/denies    Pain Onset  1 to 4 weeks ago        TREATMENT:  Therapeutic exercise:to centralize symptoms and improve ROM, strength, muscular endurance, and activity tolerance required for successful completion of functional activities.  (removed brace fordurationof session)  -Recumbent Bikeseat setting 15. For improved lower extremity ROM, muscular endurance, and activity tolerance; and to induce the analgesic effect of aerobic exercise, stimulate joint nutrition, and prepare body structures and systems for following interventions, freely rotating x67mnutes then moved seat up to 10 and went back and forth in available ROM with mild flexion overpressure to improve flexion x 4 min. Requiredstandbyassistance to get on/off. Instructions for technique provided.115 degrees AAROM at conclusion of exercise.    - seated long arc quad 3x20 each side with 7.5# ankle weight. Cuing for strong quad contraction and careful monitoring for symptoms.  - standing R hamstring curls x 10 with 7.5# ankle weight, 2x10 with 5# ankle weight.  Cuing for improved ROM.   - prone quad set 3x10, cuing to keep hip down and use quad to move thigh. Noted for improvement each set. Added to HEP.   - sidelying hip abduction with slight forward rotation of trunk and extensive cuing to move leg into abduction and  slight extension. Very weak and poor endurance noted. 3x20 each side. Improved form compared to last session.   - Sidlying clam shell x20 L side with red theraband, noted for improved form.  Cuing throughout for proper form, strong quad contractoin, monitoring for pain.   - Education on HEP including handout    HOME EXERCISE PROGRAM Access Code: [email or web address]  URL: [email or web address]  Date: 05/22/2019  Prepared by: Rosita Kea   Exercises  Supine Heel Slide  with Strap - 1 sets - 20 reps - 3 seconds hold - 1x daily - 7x weekly  Active Straight Leg Raise with Quad Set - 3 sets - 10 reps - 1 second hold - 1x daily - 7x weekly  Clam Shells in Side Lying - 30 reps - 1-3 seconds hold - 1x daily - 1x weekly  Seated Long Arc Quad - 60 reps - 3 seconds hold - 2x daily - 7x weekly  Squat - 3 sets - 10 reps         PT Education - 05/24/19 1749    Education Details  exercise purpose/form. self management techniques. Updated HEP    Person(s) Educated  Patient    Methods  Explanation;Demonstration;Tactile cues;Verbal cues;Handout    Comprehension  Verbalized understanding;Returned demonstration;Verbal cues required;Need further instruction;Tactile cues required       PT Short Term Goals - 04/24/19 1426      PT SHORT TERM GOAL #1   Title  Be independent with initial home exercise program for self-management of symptoms.    Baseline  initial HEP provided at IE (04/18/2019);    Time  2    Period  Weeks    Status  Achieved    Target Date  05/02/19        PT Long Term Goals - 05/18/19 1936      PT LONG TERM GOAL #1   Title  Be independent with a long-term home exercise program for self-management of symptoms.    Baseline  initial HEP provided at IE (04/18/2019); continues to participate in currently appropriate HEP (05/18/2019);    Time  12    Period  Weeks    Status  Partially Met    Target Date  07/11/19      PT LONG TERM GOAL #2   Title  Demonstrate improved FOTO score by 10 units to demonstrate improvement in overall condition and self-reported functional ability.    Baseline  44 (04/18/2019); 52 (05/18/2019)    Time  12    Period  Weeks    Status  Partially Met    Target Date  07/11/19      PT LONG TERM GOAL #3   Title  Complete community, work and/or recreational activities without limitation due to current condition.    Baseline  Difficulites with walking, standing, shopping, driving, cooking and bathing, currently NWB L LE  (04/18/2019); continues with similar difficulties but is now PWB25% and is able to drive, cross legs to don shoes (05/18/2019);    Time  12    Period  Weeks    Status  Partially Met    Target Date  07/11/19      PT LONG TERM GOAL #4   Title  Increase L knee flexion AROM to 140 degrees to be able to complete ADLs without limitation of knee flexion    Baseline  PROM 95 degrees of flexion with pain at end range and AROM 80 degrees(04/18/2019); AAROM 110 with end range pain (05/18/2019);  Time  12    Period  Weeks    Status  Partially Met    Target Date  07/11/19      PT LONG TERM GOAL #5   Title  Pt will increase LE strength to equal or greater than 4+/5 MMT in order to demonstrate improvement in strength and function.    Baseline  See objectives on IE(04/18/2019); still 2/10 L knee extension (lacking last 22 degrees against gravity) (05/18/2019);    Time  12    Period  Weeks    Status  Partially Met    Target Date  07/11/19            Plan - 05/24/19 1753    Clinical Impression Statement  Pt tolerated treatment well and continues to make progress towards goals. Able to progress reps and resistance in several exercises today and required less cuing for proper position for hip abduction and quad sets so added to HEP.  Pt was able to complete all exercises with minimal to no lasting increase in pain or discomfort. Pt required multimodal cuing for proper technique and to facilitate improved neuromuscular control, strength, range of motion, and functional ability resulting in improved performance and form. Patient would benefit from continued physical therapy to address remaining impairments and functional limitations to work towards stated goals and return to PLOF or maximal functional independence.    Personal Factors and Comorbidities  Comorbidity 2;Age    Comorbidities  L ankle surgery; hypertension; history of falling; L knee pain.    Examination-Activity Limitations  Bathing;Bed  Mobility;Locomotion Level;Stairs;Stand;Hygiene/Grooming;Bend;Caring for Others;Sleep;Lift;Transfers;Toileting;Squat;Carry    Examination-Participation Restrictions  Yard Work;Driving;Meal Prep;Laundry;Shop;Cleaning    Stability/Clinical Decision Making  Evolving/Moderate complexity    Rehab Potential  Good    PT Frequency  2x / week    PT Duration  12 weeks    PT Treatment/Interventions  ADLs/Self Care Home Management;Cryotherapy;Moist Heat;Gait training;Stair training;Functional mobility training;Therapeutic activities;Therapeutic exercise;Balance training;Neuromuscular re-education;Manual techniques;Joint Manipulations;Passive range of motion;Dry needling;DME Instruction    PT Next Visit Plan  Strengthening, ROM and muscle endurance    PT Home Exercise Plan  Medbridge: [email or web address]    Consulted and Agree with Plan of Care  Patient       Patient will benefit from skilled therapeutic intervention in order to improve the following deficits and impairments:  Abnormal gait, Decreased endurance, Decreased activity tolerance, Decreased range of motion, Decreased strength, Pain, Impaired flexibility, Difficulty walking, Decreased balance, Impaired perceived functional ability, Decreased knowledge of use of DME, Decreased mobility  Visit Diagnosis: Acute pain of left knee  Unsteadiness on feet  Muscle weakness (generalized)     Problem List There are no active problems to display for this patient.   Everlean Alstrom. Graylon Good, PT, DPT 05/24/19, 5:53 PM  [facility] [address], [place], [address] Phone: [phone]   Fax:  [phone]  Name: SONU KRUCKENBERG MRN: [id number] Date of Birth: [birth year]

## 2019-05-29 ENCOUNTER — Ambulatory Visit: Payer: PPO | Admitting: Physical Therapy

## 2019-05-29 ENCOUNTER — Other Ambulatory Visit: Payer: Self-pay

## 2019-05-29 ENCOUNTER — Encounter: Payer: Self-pay | Admitting: Physical Therapy

## 2019-05-29 DIAGNOSIS — M25562 Pain in left knee: Secondary | ICD-10-CM | POA: Diagnosis not present

## 2019-05-29 DIAGNOSIS — R2681 Unsteadiness on feet: Secondary | ICD-10-CM

## 2019-05-29 DIAGNOSIS — M6281 Muscle weakness (generalized): Secondary | ICD-10-CM

## 2019-05-29 NOTE — Therapy (Signed)
[place] [facility] [address], [place], [address] Phone: [phone]   Fax:  [phone]  Physical Therapy Treatment  Patient Details  Name: Glenn Watson MRN: [id number] Date of Birth: 08/28/1945 Referring Provider (PT): Altamese Aquilla, MD   Encounter Date: 05/29/2019  PT End of Session - 05/29/19 1402    Visit Number  13    Number of Visits  24    Date for PT Re-Evaluation  07/11/19    Authorization Type  [employer] reporting from 05/22/2019    Authorization - Visit Number  3    Authorization - Number of Visits  10    PT Start Time  1350    PT Stop Time  1430    PT Time Calculation (min)  40 min    Activity Tolerance  Patient tolerated treatment well    Behavior During Therapy  [facility] for tasks assessed/performed       Past Medical History:  Diagnosis Date   Hypertension     Past Surgical History:  Procedure Laterality Date   APPENDECTOMY  2011   Dr. Bary Castilla   COLONOSCOPY WITH PROPOFOL N/A 02/10/2017   Procedure: COLONOSCOPY WITH PROPOFOL;  Surgeon: Christene Lye, MD;  Location: [facility] ENDOSCOPY;  Service: Endoscopy;  Laterality: N/A;    There were no vitals filed for this visit.  Subjective Assessment - 05/29/19 1355    Subjective  Patient reports he is feeling good today with no pain. He has not been able to get to the pool since last week due to the Thanksgiving holidays. He has been doing his HEP as prescried and they continue to be challenging. He did wake up in the morning pretty stiff one day, but this resloved with some movement. No excessive swelling.    Pertinent History  L ankle surgery; hypertension; history of falling; L knee pain.    Limitations  Lifting;Standing;Walking;House hold activities    Diagnostic tests  CT: Comminuted and impacted intra-articular fracture of the proximaltibia as described. This fracture involves both tibial plateaus andis associated with  depression of the articular surface posteriorly. Mildly comminuted intra-articular fracture of the fibular headand neck. 04/07/2019    Patient Stated Goals  Able to walk without AD    Currently in Pain?  No/denies    Pain Onset  1 to 4 weeks ago       TREATMENT:  Therapeutic exercise:to centralize symptoms and improve ROM, strength, muscular endurance, and activity tolerance required for successful completion of functional activities.  (removed brace fordurationof session)  -Recumbent Bikeseat setting 15. For improved lower extremity ROM, muscular endurance, and activity tolerance; and to induce the analgesic effect of aerobic exercise, stimulate joint nutrition, and prepare body structures and systems for following interventions, freely rotatingx4 minutesthen moved seat up to 10 and went back and forth in available ROM with mild flexion overpressure to improve flexion x 4 min.Requiredstandbyassistance to get on/off. Instructions for technique provided.115 degrees AAROM at conclusion of exercise.  - seated long arc quad 3x20 each side with 7.5# L 15# LRankle weight. Cuing for strong quad contraction and careful monitoring for symptoms.  - seated L 4-way ankle x 40 reps each direction. Red theraband eversion, inversion, and dorsiflexion, green theraband plantar flexion. Inversion and dorsiflexion most challenging. Verbal, tactile, and external cues provided to minimize tibial rotation. To prepare for improved balance and ankle/foot control with weight bearing.   - long sitting SLR over 7 inch cone and back,  4x10 L LE, 3x10 R LE. Slight tap on top of cone after first set to L. To improve terminal knee extension.  - sidelying hip abduction with slight forward rotation of trunk and extensive cuing to move leg into abduction and slight extension. Very weak and poor endurance noted. x20 R side. Improved form compared to last session. Felt more in leg today.   Cuing throughout  for proper form, strong quad contractoin, monitoring for pain.   -Education on [place] Access Code: [email or web address]  URL: [email or web address]  Date: 05/24/2019  Prepared by: Rosita Kea   Exercises Supine Heel Slide with Strap - 1 sets - 20 reps - 3 seconds hold - 1x daily - 7x weekly Active Straight Leg Raise with Quad Set - 3 sets - 10 reps - 1 second hold - 1x daily - 7x weekly Prone Quadriceps Set - 20 reps - 3 seconds hold - 1-2x daily Clamshell with Resistance - 3 sets - 20 reps - 1-3 second hold - 1x weekly Squat - 3 sets - 10 reps        PT Education - 05/29/19 1402    Education Details  exercise purpose/form. self management techniques.    Person(s) Educated  Patient    Methods  Explanation;Demonstration;Tactile cues;Verbal cues    Comprehension  Verbalized understanding;Returned demonstration;Verbal cues required;Tactile cues required;Need further instruction       PT Short Term Goals - 04/24/19 1426      PT SHORT TERM GOAL #1   Title  Be independent with initial home exercise program for self-management of symptoms.    Baseline  initial HEP provided at IE (04/18/2019);    Time  2    Period  Weeks    Status  Achieved    Target Date  05/02/19        PT Long Term Goals - 05/18/19 1936      PT LONG TERM GOAL #1   Title  Be independent with a long-term home exercise program for self-management of symptoms.    Baseline  initial HEP provided at IE (04/18/2019); continues to participate in currently appropriate HEP (05/18/2019);    Time  12    Period  Weeks    Status  Partially Met    Target Date  07/11/19      PT LONG TERM GOAL #2   Title  Demonstrate improved FOTO score by 10 units to demonstrate improvement in overall condition and self-reported functional ability.    Baseline  44 (04/18/2019); 52 (05/18/2019)    Time  12    Period  Weeks    Status  Partially Met    Target Date  07/11/19      PT LONG TERM GOAL #3     Title  Complete community, work and/or recreational activities without limitation due to current condition.    Baseline  Difficulites with walking, standing, shopping, driving, cooking and bathing, currently NWB L LE (04/18/2019); continues with similar difficulties but is now PWB25% and is able to drive, cross legs to don shoes (05/18/2019);    Time  12    Period  Weeks    Status  Partially Met    Target Date  07/11/19      PT LONG TERM GOAL #4   Title  Increase L knee flexion AROM to 140 degrees to be able to complete ADLs without limitation of knee flexion    Baseline  PROM 95 degrees of flexion with pain  at end range and AROM 80 degrees(04/18/2019); AAROM 110 with end range pain (05/18/2019);    Time  12    Period  Weeks    Status  Partially Met    Target Date  07/11/19      PT LONG TERM GOAL #5   Title  Pt will increase LE strength to equal or greater than 4+/5 MMT in order to demonstrate improvement in strength and function.    Baseline  See objectives on IE(04/18/2019); still 2/10 L knee extension (lacking last 22 degrees against gravity) (05/18/2019);    Time  12    Period  Weeks    Status  Partially Met    Target Date  07/11/19            Plan - 05/29/19 2023    Clinical Impression Statement  Patient tolerated treatment well and continues to make progress towards goals. Ankle exercises were introduced today to improve transition to weight bearing when restrictions are eased. Continued to work on improving knee ROM and terminal knee extension activation. Patient continues to struggle to activate quad but is able to demonstrate acceptable form in hip abduction exercise. Patient would benefit from continued physical therapy to address remaining impairments and functional limitations to work towards stated goals and return to PLOF or maximal functional independence.    Personal Factors and Comorbidities  Comorbidity 2;Age    Comorbidities  L ankle surgery; hypertension;  history of falling; L knee pain.    Examination-Activity Limitations  Bathing;Bed Mobility;Locomotion Level;Stairs;Stand;Hygiene/Grooming;Bend;Caring for Others;Sleep;Lift;Transfers;Toileting;Squat;Carry    Examination-Participation Restrictions  Yard Work;Driving;Meal Prep;Laundry;Shop;Cleaning    Stability/Clinical Decision Making  Evolving/Moderate complexity    Rehab Potential  Good    PT Frequency  2x / week    PT Duration  12 weeks    PT Treatment/Interventions  ADLs/Self Care Home Management;Cryotherapy;Moist Heat;Gait training;Stair training;Functional mobility training;Therapeutic activities;Therapeutic exercise;Balance training;Neuromuscular re-education;Manual techniques;Joint Manipulations;Passive range of motion;Dry needling;DME Instruction    PT Next Visit Plan  Strengthening, ROM and muscle endurance    PT Home Exercise Plan  Medbridge: [email or web address]    Consulted and Agree with Plan of Care  Patient       Patient will benefit from skilled therapeutic intervention in order to improve the following deficits and impairments:  Abnormal gait, Decreased endurance, Decreased activity tolerance, Decreased range of motion, Decreased strength, Pain, Impaired flexibility, Difficulty walking, Decreased balance, Impaired perceived functional ability, Decreased knowledge of use of DME, Decreased mobility  Visit Diagnosis: Acute pain of left knee  Unsteadiness on feet  Muscle weakness (generalized)     Problem List There are no active problems to display for this patient.   Everlean Alstrom. Graylon Good, PT, DPT 05/29/19, 8:24 PM  [facility] [facility] [address] [place], [address] Phone: [phone]   Fax:  [phone]  Name: Glenn Watson MRN: [id number] Date of Birth: Sep 21, 1945

## 2019-06-01 ENCOUNTER — Encounter: Payer: Self-pay | Admitting: Physical Therapy

## 2019-06-01 ENCOUNTER — Other Ambulatory Visit: Payer: Self-pay

## 2019-06-01 ENCOUNTER — Ambulatory Visit: Payer: PPO | Attending: Orthopedic Surgery | Admitting: Physical Therapy

## 2019-06-01 DIAGNOSIS — R2681 Unsteadiness on feet: Secondary | ICD-10-CM

## 2019-06-01 DIAGNOSIS — M6281 Muscle weakness (generalized): Secondary | ICD-10-CM

## 2019-06-01 DIAGNOSIS — M25562 Pain in left knee: Secondary | ICD-10-CM

## 2019-06-01 NOTE — Therapy (Signed)
[facility] [address] [place], [address] Phone: [phone]   Fax:  [phone]  Physical Therapy Treatment  Patient Details  Name: Glenn Watson MRN: [id number] Date of Birth: 09/11/1945 Referring Provider (PT): Altamese Maricao, MD   Encounter Date: 06/01/2019  PT End of Session - 06/01/19 1309    Visit Number  14    Number of Visits  24    Date for PT Re-Evaluation  07/11/19    Authorization Type  [employer] reporting from 05/22/2019    Authorization - Visit Number  4    Authorization - Number of Visits  10    PT Start Time  1300    PT Stop Time  1340    PT Time Calculation (min)  40 min    Activity Tolerance  Patient tolerated treatment well    Behavior During Therapy  [facility] for tasks assessed/performed       Past Medical History:  Diagnosis Date  . Hypertension     Past Surgical History:  Procedure Laterality Date  . APPENDECTOMY  2011   Dr. Bary Castilla  . COLONOSCOPY WITH PROPOFOL N/A 02/10/2017   Procedure: COLONOSCOPY WITH PROPOFOL;  Surgeon: Christene Lye, MD;  Location: [facility] ENDOSCOPY;  Service: Endoscopy;  Laterality: N/A;    There were no vitals filed for this visit.  Subjective Assessment - 06/01/19 1303    Subjective  Patient reports he is feeling good today with no pain. He went to the gym yesterday and did mostly upper body exercise and then rode the recumbent bike on low resistance for at least 10 min then stretched his knee and thinks he got 120 degrees (wife who is a retired PT took the measurement and pt read it, since she cannot see well). Reports no excessive soreness or pain following last treatment session or following going to the gym yesterday.    Pertinent History  L ankle surgery; hypertension; history of falling; L knee pain.    Limitations  Lifting;Standing;Walking;House hold activities    Diagnostic tests  CT: Comminuted and impacted intra-articular  fracture of the proximaltibia as described. This fracture involves both tibial plateaus andis associated with depression of the articular surface posteriorly. Mildly comminuted intra-articular fracture of the fibular headand neck. 04/07/2019    Patient Stated Goals  Able to walk without AD    Currently in Pain?  No/denies    Pain Onset  1 to 4 weeks ago      TREATMENT:  Therapeutic exercise:to centralize symptoms and improve ROM, strength, muscular endurance, and activity tolerance required for successful completion of functional activities.  (removed brace fordurationof session)  -Recumbent Bikeseat setting 14. For improved lower extremity ROM, muscular endurance, and activity tolerance; and to induce the analgesic effect of aerobic exercise, stimulate joint nutrition, and prepare body structures and systems for following interventions, freely rotatingx4 minutesthen moved seat up to 10 and went back and forth in available ROM with mild flexion overpressure to improve flexionx 4 min.Requiredstandbyassistance to get on/off. Instructions for technique provided.115degrees AAROM at conclusion of exercise.  - seated hamstring curl against green band looped around ankle and held by clinician. 2x30 each side. Cuing for improved range.   - seated long arc quad 3x30each sidewith 10# L, 15# R ankle weight. Cuing for strong quad contractionand careful monitoring for symptoms.  - sitting at  [facility] of chair SLR over 7 inch cone and back, x20, x10 L LE. Slight tap on  top of cone after first set to L. To improve terminal knee extension.  - seated (heels on floor) L 4-way ankle x 40-50 reps each direction. green theraband (with single band to loop) eversion, inversion, and dorsiflexion, blue theraband plantar flexion (looped around foot). Inversion and dorsiflexion most challenging. Verbal, tactile, and external cues provided to minimize tibial rotation. To prepare for improved balance  and ankle/foot control with weight bearing.   Cuing throughout for proper form, strong quad contraction, monitoring for pain.     HOME EXERCISE PROGRAM Access Code: [email or web address]      URL: [email or web address]    Date: 05/24/2019  Prepared by: Rosita Kea   Exercises Supine Heel Slide with Strap - 1 sets - 20 reps - 3 seconds hold - 1x daily - 7x weekly Active Straight Leg Raise with Quad Set - 3 sets - 10 reps - 1 second hold - 1x daily - 7x weekly Prone Quadriceps Set - 20 reps - 3 seconds hold - 1-2x daily Clamshell with Resistance - 3 sets - 20 reps - 1-3 second hold - 1x weekly Squat - 3 sets - 10 reps      PT Education - 06/01/19 1309    Education Details  exercise purpose/form. self management techniques.    Person(s) Educated  Patient    Methods  Explanation;Demonstration;Tactile cues;Verbal cues    Comprehension  Verbalized understanding;Returned demonstration;Verbal cues required;Tactile cues required;Need further instruction       PT Short Term Goals - 04/24/19 1426      PT SHORT TERM GOAL #1   Title  Be independent with initial home exercise program for self-management of symptoms.    Baseline  initial HEP provided at IE (04/18/2019);    Time  2    Period  Weeks    Status  Achieved    Target Date  05/02/19        PT Long Term Goals - 05/18/19 1936      PT LONG TERM GOAL #1   Title  Be independent with a long-term home exercise program for self-management of symptoms.    Baseline  initial HEP provided at IE (04/18/2019); continues to participate in currently appropriate HEP (05/18/2019);    Time  12    Period  Weeks    Status  Partially Met    Target Date  07/11/19      PT LONG TERM GOAL #2   Title  Demonstrate improved FOTO score by 10 units to demonstrate improvement in overall condition and self-reported functional ability.    Baseline  44 (04/18/2019); 52 (05/18/2019)    Time  12    Period  Weeks    Status  Partially Met     Target Date  07/11/19      PT LONG TERM GOAL #3   Title  Complete community, work and/or recreational activities without limitation due to current condition.    Baseline  Difficulites with walking, standing, shopping, driving, cooking and bathing, currently NWB L LE (04/18/2019); continues with similar difficulties but is now PWB25% and is able to drive, cross legs to don shoes (05/18/2019);    Time  12    Period  Weeks    Status  Partially Met    Target Date  07/11/19      PT LONG TERM GOAL #4   Title  Increase L knee flexion AROM to 140 degrees to be able to complete ADLs without limitation of knee flexion    Baseline  PROM  95 degrees of flexion with pain at end range and AROM 80 degrees(04/18/2019); AAROM 110 with end range pain (05/18/2019);    Time  12    Period  Weeks    Status  Partially Met    Target Date  07/11/19      PT LONG TERM GOAL #5   Title  Pt will increase LE strength to equal or greater than 4+/5 MMT in order to demonstrate improvement in strength and function.    Baseline  See objectives on IE(04/18/2019); still 2/10 L knee extension (lacking last 22 degrees against gravity) (05/18/2019);    Time  12    Period  Weeks    Status  Partially Met    Target Date  07/11/19            Plan - 06/01/19 1312    Clinical Impression Statement  Patient tolerated treatment well and continues to make progress towards goals. He continues to work on improved ROM and quad and hamstring strength as well as hip strengthening in preparation for increased weight bearing and gait. Pt was able to complete all exercises with minimal to no lasting increase in pain or discomfort. Pt required multimodal cuing for proper technique and to facilitate improved neuromuscular control, strength, range of motion, and functional ability resulting in improved performance and form. Patient would benefit from continued physical therapy to address remaining impairments and functional limitations to work  towards stated goals and return to PLOF or maximal functional independence.    Personal Factors and Comorbidities  Comorbidity 2;Age    Comorbidities  L ankle surgery; hypertension; history of falling; L knee pain.    Examination-Activity Limitations  Bathing;Bed Mobility;Locomotion Level;Stairs;Stand;Hygiene/Grooming;Bend;Caring for Others;Sleep;Lift;Transfers;Toileting;Squat;Carry    Examination-Participation Restrictions  Yard Work;Driving;Meal Prep;Laundry;Shop;Cleaning    Stability/Clinical Decision Making  Evolving/Moderate complexity    Rehab Potential  Good    PT Frequency  2x / week    PT Duration  12 weeks    PT Treatment/Interventions  ADLs/Self Care Home Management;Cryotherapy;Moist Heat;Gait training;Stair training;Functional mobility training;Therapeutic activities;Therapeutic exercise;Balance training;Neuromuscular re-education;Manual techniques;Joint Manipulations;Passive range of motion;Dry needling;DME Instruction    PT Next Visit Plan  Strengthening, ROM and muscle endurance    PT Home Exercise Plan  Medbridge: [email or web address]    Consulted and Agree with Plan of Care  Patient       Patient will benefit from skilled therapeutic intervention in order to improve the following deficits and impairments:  Abnormal gait, Decreased endurance, Decreased activity tolerance, Decreased range of motion, Decreased strength, Pain, Impaired flexibility, Difficulty walking, Decreased balance, Impaired perceived functional ability, Decreased knowledge of use of DME, Decreased mobility  Visit Diagnosis: Acute pain of left knee  Unsteadiness on feet  Muscle weakness (generalized)     Problem List There are no active problems to display for this patient.   Everlean Alstrom. Graylon Good, PT, DPT 06/01/19, 8:42 PM  [facility] [address], [place], [address] Phone: [phone]   Fax:  [phone]  Name: Glenn Watson MRN:  [id number] Date of Birth: December 07, 1945

## 2019-06-05 DIAGNOSIS — S82222A Displaced transverse fracture of shaft of left tibia, initial encounter for closed fracture: Secondary | ICD-10-CM | POA: Diagnosis not present

## 2019-06-06 ENCOUNTER — Encounter: Payer: Self-pay | Admitting: Physical Therapy

## 2019-06-06 ENCOUNTER — Ambulatory Visit: Payer: PPO | Admitting: Physical Therapy

## 2019-06-06 ENCOUNTER — Other Ambulatory Visit: Payer: Self-pay

## 2019-06-06 DIAGNOSIS — M25562 Pain in left knee: Secondary | ICD-10-CM

## 2019-06-06 DIAGNOSIS — R2681 Unsteadiness on feet: Secondary | ICD-10-CM

## 2019-06-06 DIAGNOSIS — M6281 Muscle weakness (generalized): Secondary | ICD-10-CM

## 2019-06-06 NOTE — Therapy (Signed)
[facility] [address] [place], [address] Phone: [phone]   Fax:  [phone]  Physical Therapy Treatment  Patient Details  Name: Glenn Watson MRN: [id number] Date of Birth: 05-Jan-1946 Referring Provider (PT): Altamese Manville, MD   Encounter Date: 06/06/2019  PT End of Session - 06/06/19 1809    Visit Number  15    Number of Visits  24    Date for PT Re-Evaluation  07/11/19    Authorization Type  [employer] reporting from 05/22/2019    Authorization - Visit Number  5    Authorization - Number of Visits  10    PT Start Time  1350    PT Stop Time  1435    PT Time Calculation (min)  45 min    Equipment Utilized During Treatment  Gait belt    Activity Tolerance  Patient tolerated treatment well    Behavior During Therapy  [facility] for tasks assessed/performed       Past Medical History:  Diagnosis Date  . Hypertension     Past Surgical History:  Procedure Laterality Date  . APPENDECTOMY  2011   Dr. Bary Castilla  . COLONOSCOPY WITH PROPOFOL N/A 02/10/2017   Procedure: COLONOSCOPY WITH PROPOFOL;  Surgeon: Christene Lye, MD;  Location: [facility] ENDOSCOPY;  Service: Endoscopy;  Laterality: N/A;    There were no vitals filed for this visit.  Subjective Assessment - 06/06/19 1441    Subjective  Patient reports no pain upon arrival today. He saw his referring physician who has upgraded his weight bearing status to WBAT. He has been practicing at home with a [facility] but is not ready to give his RW yet. He states it has been overall comfortable except when he turns a certian way.    Pertinent History  L ankle surgery; hypertension; history of falling; L knee pain.    Limitations  Lifting;Standing;Walking;House hold activities    Diagnostic tests  CT: Comminuted and impacted intra-articular fracture of the proximaltibia as described. This fracture involves both tibial plateaus andis associated with  depression of the articular surface posteriorly. Mildly comminuted intra-articular fracture of the fibular headand neck. 04/07/2019    Patient Stated Goals  Able to walk without AD    Currently in Pain?  No/denies    Pain Onset  1 to 4 weeks ago       TREATMENT:  Therapeutic exercise:to centralize symptoms and improve ROM, strength, muscular endurance, and activity tolerance required for successful completion of functional activities.  (removed brace forfirst part of session)  -Recumbent Bikeseat setting 14. For improved lower extremity ROM, muscular endurance, and activity tolerance; and to induce the analgesic effect of aerobic exercise, stimulate joint nutrition, and prepare body structures and systems for following interventions, freely rotatingx3 minutesthen moved seat up to 9 and went back and forth in available ROM with mild flexion overpressure to improve flexionx 5 min.115degrees AAROM at conclusion of exercise.  - standing TKE against red then blue theraband x 30. Good quad contraction. Hesitant to fully weightbear on R LE. Notes feeling strong contraction at L quad.  - standing high knee marching facing bar with BUE support to improve weight shift. 2x10 each side.    - Squats with BUE support focusing on glute activation and proper form with hip hinging, stabilized back, and tibial perpendicular to floor with knees behind toes. X 30. Patient hinging more from hips than knees, but with good control of lumbar  spine - appropriate for initial attempt.   - sit <> stand from elevated surface (nustep at highest level, 24 inches), x30. Mirror biofeedback used to help patient see how he was shifting towards R. Notes strong sensation of muscle fatigue in R and a bit in L quads.   Cuing throughout for proper form, strong quad contraction, monitoring for pain. Updated HEP with handout.   Gait training: for improved functional mobility in home and community.   (donned brace  again)   - Ambulation with SPC, 4-6 sets of 20 feet with CGA and cuing for improved wieght shifting and L LE weight bearing.  Noted for lack of hip shift to the left.   - practiced pre-gait weight shifts in standing with mirror biofeedback. Several on floor, then again in treadmill bars to simulate parallel bars. Noted for significant bowing of L leg. Also practiced pre-gait stepping with weight shiftswith minA and BUE on bars or SPC.   - stair training using Hiller on 6 inch steps with R handrail. X 16 steps and 4 steps with BUE support on handrails. Cuing for use of strong leg to control weight for safe stair climbing at home. Also practiced L heel touches off last step to illustrate control of R quad during descent. Required SBA - CGA for safety. Hooked toe on edge of stairs x 3 times but able to recover without physical assistance.     HOME EXERCISE PROGRAM Access Code: [email or web address]  URL: [email or web address]  Date: 06/06/2019  Prepared by: Rosita Kea   Exercises Supine Heel Slide with Strap - 1 sets - 20 reps - 3 seconds hold - 1x daily - 7x weekly Clamshell with Resistance - 3 sets - 20 reps - 1-3 second hold - 1x weekly Standing Terminal Knee Extension with Resistance - 30 reps - 5 second hold - 1-2x daily Standing March with Counter Support - 30 reps - 1-2x daily Squat with Counter Support - 3 sets - 10 reps - 1-2x daily Sit to Stand without Arm Support - 3 sets - 10 reps - 1-2x daily     PT Education - 06/06/19 1809    Education Details  exercise purpose/form. self management techniques. Updated HEP    Person(s) Educated  Patient    Methods  Explanation;Demonstration;Tactile cues;Verbal cues;Handout    Comprehension  Verbalized understanding;Returned demonstration;Verbal cues required;Tactile cues required;Need further instruction       PT Short Term Goals - 04/24/19 1426      PT SHORT TERM GOAL #1   Title  Be independent with initial home exercise program  for self-management of symptoms.    Baseline  initial HEP provided at IE (04/18/2019);    Time  2    Period  Weeks    Status  Achieved    Target Date  05/02/19        PT Long Term Goals - 05/18/19 1936      PT LONG TERM GOAL #1   Title  Be independent with a long-term home exercise program for self-management of symptoms.    Baseline  initial HEP provided at IE (04/18/2019); continues to participate in currently appropriate HEP (05/18/2019);    Time  12    Period  Weeks    Status  Partially Met    Target Date  07/11/19      PT LONG TERM GOAL #2   Title  Demonstrate improved FOTO score by 10 units to demonstrate improvement in overall condition and self-reported functional ability.  Baseline  44 (04/18/2019); 52 (05/18/2019)    Time  12    Period  Weeks    Status  Partially Met    Target Date  07/11/19      PT LONG TERM GOAL #3   Title  Complete community, work and/or recreational activities without limitation due to current condition.    Baseline  Difficulites with walking, standing, shopping, driving, cooking and bathing, currently NWB L LE (04/18/2019); continues with similar difficulties but is now PWB25% and is able to drive, cross legs to don shoes (05/18/2019);    Time  12    Period  Weeks    Status  Partially Met    Target Date  07/11/19      PT LONG TERM GOAL #4   Title  Increase L knee flexion AROM to 140 degrees to be able to complete ADLs without limitation of knee flexion    Baseline  PROM 95 degrees of flexion with pain at end range and AROM 80 degrees(04/18/2019); AAROM 110 with end range pain (05/18/2019);    Time  12    Period  Weeks    Status  Partially Met    Target Date  07/11/19      PT LONG TERM GOAL #5   Title  Pt will increase LE strength to equal or greater than 4+/5 MMT in order to demonstrate improvement in strength and function.    Baseline  See objectives on IE(04/18/2019); still 2/10 L knee extension (lacking last 22 degrees against  gravity) (05/18/2019);    Time  12    Period  Weeks    Status  Partially Met    Target Date  07/11/19            Plan - 06/06/19 2014    Clinical Impression Statement  Patient tolerated treatment well and continues to make progress towards goals. His weight bearing status was upgraded to WBAT, so today's session focused on transitioning to normal gait and weight bearing activities. Demonstrated good quad control in L LE but continues to be weak. Noted for significant reluctance to shift weight towards L LE when ambulating. Also noted for varus positioning in the L lower leg, but no pain. Will continue to monitor as some deformity has been reported to be expected from physician per pt. Patient demonstrated improved confidence and skill with skilled cuing and assistance during today's session. Patient would benefit from continued physical therapy to address remaining impairments and functional limitations to work towards stated goals and return to PLOF or maximal functional independence.    Personal Factors and Comorbidities  Comorbidity 2;Age    Comorbidities  L ankle surgery; hypertension; history of falling; L knee pain.    Examination-Activity Limitations  Bathing;Bed Mobility;Locomotion Level;Stairs;Stand;Hygiene/Grooming;Bend;Caring for Others;Sleep;Lift;Transfers;Toileting;Squat;Carry    Examination-Participation Restrictions  Yard Work;Driving;Meal Prep;Laundry;Shop;Cleaning    Stability/Clinical Decision Making  Evolving/Moderate complexity    Rehab Potential  Good    PT Frequency  2x / week    PT Duration  12 weeks    PT Treatment/Interventions  ADLs/Self Care Home Management;Cryotherapy;Moist Heat;Gait training;Stair training;Functional mobility training;Therapeutic activities;Therapeutic exercise;Balance training;Neuromuscular re-education;Manual techniques;Joint Manipulations;Passive range of motion;Dry needling;DME Instruction    PT Next Visit Plan  Strengthening, ROM and muscle  endurance    PT Home Exercise Plan  Medbridge: [email or web address]    Consulted and Agree with Plan of Care  Patient       Patient will benefit from skilled therapeutic intervention in order to improve the following deficits and  impairments:  Abnormal gait, Decreased endurance, Decreased activity tolerance, Decreased range of motion, Decreased strength, Pain, Impaired flexibility, Difficulty walking, Decreased balance, Impaired perceived functional ability, Decreased knowledge of use of DME, Decreased mobility  Visit Diagnosis: Acute pain of left knee  Unsteadiness on feet  Muscle weakness (generalized)     Problem List There are no active problems to display for this patient.   Everlean Alstrom. Graylon Good, PT, DPT 06/06/19, 8:16 PM  [facility] [address] [place], [address] Phone: [phone]   Fax:  [phone]  Name: Glenn Watson MRN: [id number] Date of Birth: 1945/07/30

## 2019-06-13 ENCOUNTER — Encounter: Payer: Self-pay | Admitting: Physical Therapy

## 2019-06-13 ENCOUNTER — Other Ambulatory Visit: Payer: Self-pay

## 2019-06-13 ENCOUNTER — Ambulatory Visit: Payer: PPO | Admitting: Physical Therapy

## 2019-06-13 DIAGNOSIS — R2681 Unsteadiness on feet: Secondary | ICD-10-CM

## 2019-06-13 DIAGNOSIS — M6281 Muscle weakness (generalized): Secondary | ICD-10-CM

## 2019-06-13 DIAGNOSIS — M25562 Pain in left knee: Secondary | ICD-10-CM | POA: Diagnosis not present

## 2019-06-13 NOTE — Therapy (Signed)
[facility] [address], [place], [address] Phone: [phone]   Fax:  [phone]  Physical Therapy Treatment  Patient Details  Name: Glenn Watson MRN: [id number] Date of Birth: [birth year] Referring Provider (PT): Altamese Catron, MD   Encounter Date: 06/13/2019  PT End of Session - 06/13/19 1401    Visit Number  16    Number of Visits  24    Date for PT Re-Evaluation  07/11/19    Authorization Type  [employer] reporting from 05/22/2019    Authorization - Visit Number  6    Authorization - Number of Visits  10    PT Start Time  1350    PT Stop Time  1430    PT Time Calculation (min)  40 min    Equipment Utilized During Treatment  Gait belt    Activity Tolerance  Patient tolerated treatment well    Behavior During Therapy  [facility] for tasks assessed/performed       Past Medical History:  Diagnosis Date  . Hypertension     Past Surgical History:  Procedure Laterality Date  . APPENDECTOMY  2011   Dr. Bary Castilla  . COLONOSCOPY WITH PROPOFOL N/A 02/10/2017   Procedure: COLONOSCOPY WITH PROPOFOL;  Surgeon: Christene Lye, MD;  Location: [facility] ENDOSCOPY;  Service: Endoscopy;  Laterality: N/A;    There were no vitals filed for this visit.  Subjective Assessment - 06/13/19 1357    Subjective  Patient reports he was doing really good and was walking some without a cane or a walker. Sunday he went to the grocery store successfully, yesterday he awoke and had R flank/low back pain different than he has had before and now cannot walk well without the cane due to pain when stepping down on the R LE. He only has pain when he stands on it 5/10 when he tries to walk. No pain when standing still. Is still able to climb the stairs. Does have a history of R sided sciatica but this feels different. Had great results from nerve glides several years ago an has done 20 reps each leg and 20 reps together every  morning for around 30 years, but stopped when he fractured his leg.    Pertinent History  L ankle surgery; hypertension; history of falling; L knee pain.    Limitations  Lifting;Standing;Walking;House hold activities    Diagnostic tests  CT: Comminuted and impacted intra-articular fracture of the proximaltibia as described. This fracture involves both tibial plateaus andis associated with depression of the articular surface posteriorly. Mildly comminuted intra-articular fracture of the fibular headand neck. 04/07/2019    Patient Stated Goals  Able to walk without AD    Currently in Pain?  Yes    Pain Score  5     Pain Location  Back    Pain Orientation  Right    Pain Onset  1 to 4 weeks ago       TREATMENT:  Therapeutic exercise:to centralize symptoms and improve ROM, strength, muscular endurance, and activity tolerance required for successful completion of functional activities.  (removed brace forfirst part of session)  -Recumbent Bikeseat setting 14. For improved lower extremity ROM, muscular endurance, and activity tolerance; and to induce the analgesic effect of aerobic exercise, stimulate joint nutrition, and prepare body structures and systems for following interventions, freely rotatingx5 minutesthen moved seat up to 9 and went back and forth in available ROM with mild flexion overpressure to  improve flexionx 4 min.115degrees AAROM at conclusion of exercise.  - supine sciatic nerve glides with knee to chest stretch first x 20 each side and then 2x10 bilaterally.   - AROM lumbar spine: painful and limited to R sidebend; other motions WFL. CGA for safety.   - prone press up, x 10. Required max cuing for technique. Very limited lumbar extension.   - trial of ambulation with RW x 20 feet. Continues to be painful during swing phase of R leg.   - Sidelying open book (thoracic rotation) to improve thoracic, shoulder girdle, and upper trunk mobility. Required  instruction for technique and cuing to achieve end range as tolerated, hold time, and breathing technique. 1 second holds. X 20 each side. R rotation last. Most painful to start and felt like it was really helping.   - supine LTR x 20 to stretch low back.   -trial of ambulation with RW to assess response x 40 feet. . Improved but still painful.   Pt required multimodal cuing for proper technique and to facilitate improved neuromuscular control, strength, range of motion, and functional ability resulting in improved performance and form.   Manual therapy: to reduce pain and tissue tension, improve range of motion, neuromodulation, in order to promote improved ability to complete functional activities. - prone STM to R lumbar region including paraspinals, quadratus lumborum and surrounding soft tissue to decrease pain and tension. Painful over mid lumbar paraspinals and some in QL but less intense.  - CPA grade III throughout thoracic and lumbar segments to assess for pain response and improve motion. No concordant pain.  - spring testing to  R ribs from mid thoracic spine down. Tender to spring testing at 11th R rib and less so surrounding ribs.     HOME EXERCISE PROGRAM Access Code: [email or web address]      URL: [email or web address]    Date: 06/06/2019  Prepared by: Rosita Kea   Exercises Supine Heel Slide with Strap - 1 sets - 20 reps - 3 seconds hold - 1x daily - 7x weekly Clamshell with Resistance - 3 sets - 20 reps - 1-3 second hold - 1x weekly Standing Terminal Knee Extension with Resistance - 30 reps - 5 second hold - 1-2x daily Standing March with Counter Support - 30 reps - 1-2x daily Squat with Counter Support - 3 sets - 10 reps - 1-2x daily Sit to Stand without Arm Support - 3 sets - 10 reps - 1-2x daily    PT Education - 06/13/19 1401    Education Details  exercise purpose/form. self management techniques.    Person(s) Educated  Patient    Methods   Explanation;Demonstration;Tactile cues;Verbal cues    Comprehension  Verbalized understanding;Returned demonstration;Verbal cues required;Tactile cues required;Need further instruction       PT Short Term Goals - 04/24/19 1426      PT SHORT TERM GOAL #1   Title  Be independent with initial home exercise program for self-management of symptoms.    Baseline  initial HEP provided at IE (04/18/2019);    Time  2    Period  Weeks    Status  Achieved    Target Date  05/02/19        PT Long Term Goals - 05/18/19 1936      PT LONG TERM GOAL #1   Title  Be independent with a long-term home exercise program for self-management of symptoms.    Baseline  initial HEP provided at IE (04/18/2019); continues to  participate in currently appropriate HEP (05/18/2019);    Time  12    Period  Weeks    Status  Partially Met    Target Date  07/11/19      PT LONG TERM GOAL #2   Title  Demonstrate improved FOTO score by 10 units to demonstrate improvement in overall condition and self-reported functional ability.    Baseline  44 (04/18/2019); 52 (05/18/2019)    Time  12    Period  Weeks    Status  Partially Met    Target Date  07/11/19      PT LONG TERM GOAL #3   Title  Complete community, work and/or recreational activities without limitation due to current condition.    Baseline  Difficulites with walking, standing, shopping, driving, cooking and bathing, currently NWB L LE (04/18/2019); continues with similar difficulties but is now PWB25% and is able to drive, cross legs to don shoes (05/18/2019);    Time  12    Period  Weeks    Status  Partially Met    Target Date  07/11/19      PT LONG TERM GOAL #4   Title  Increase L knee flexion AROM to 140 degrees to be able to complete ADLs without limitation of knee flexion    Baseline  PROM 95 degrees of flexion with pain at end range and AROM 80 degrees(04/18/2019); AAROM 110 with end range pain (05/18/2019);    Time  12    Period  Weeks    Status   Partially Met    Target Date  07/11/19      PT LONG TERM GOAL #5   Title  Pt will increase LE strength to equal or greater than 4+/5 MMT in order to demonstrate improvement in strength and function.    Baseline  See objectives on IE(04/18/2019); still 2/10 L knee extension (lacking last 22 degrees against gravity) (05/18/2019);    Time  12    Period  Weeks    Status  Partially Met    Target Date  07/11/19            Plan - 06/13/19 1552    Clinical Impression Statement  Patient tolerated treatment session well and continues to make progress towards goals overall. Patient presented with complaint of new R sided low back pain that appeared related to his increase in ambulation and was affecting his ability to continue progressing to improved independence with household and community mobility, so today's session focused on assessing and decreasing that pain. Pain was determined to be most provoked during initial half of swing through phase of the R LE during gait cycle and was not bothersome in resting non -weight bearing positions. STM to the R lumbar region as well as exercises for improved lumbothoracic mobility resulted in report of improved pain response, but did not eliminate pain. Suspect irritation to the region that will continue to improve with time, but provided pt with simple exercises he can continue at home that felt good in the clinic today for improved self-management and confidence. Patient continues to have impairments such as strength, flexibility, activity tolerance, and balance deficits that lead to functional limitations such as community and household mobility and community participation. Patient would benefit from continued physical therapy to address remaining impairments and functional limitations to work towards stated goals and return to PLOF or maximal functional independence.    Personal Factors and Comorbidities  Comorbidity 2;Age    Comorbidities  L ankle  surgery;  hypertension; history of falling; L knee pain.    Examination-Activity Limitations  Bathing;Bed Mobility;Locomotion Level;Stairs;Stand;Hygiene/Grooming;Bend;Caring for Others;Sleep;Lift;Transfers;Toileting;Squat;Carry    Examination-Participation Restrictions  Yard Work;Driving;Meal Prep;Laundry;Shop;Cleaning    Stability/Clinical Decision Making  Evolving/Moderate complexity    Rehab Potential  Good    PT Frequency  2x / week    PT Duration  12 weeks    PT Treatment/Interventions  ADLs/Self Care Home Management;Cryotherapy;Moist Heat;Gait training;Stair training;Functional mobility training;Therapeutic activities;Therapeutic exercise;Balance training;Neuromuscular re-education;Manual techniques;Joint Manipulations;Passive range of motion;Dry needling;DME Instruction    PT Next Visit Plan  Strengthening, ROM and muscle endurance    PT Home Exercise Plan  Medbridge: [email or web address]    Consulted and Agree with Plan of Care  Patient       Patient will benefit from skilled therapeutic intervention in order to improve the following deficits and impairments:  Abnormal gait, Decreased endurance, Decreased activity tolerance, Decreased range of motion, Decreased strength, Pain, Impaired flexibility, Difficulty walking, Decreased balance, Impaired perceived functional ability, Decreased knowledge of use of DME, Decreased mobility  Visit Diagnosis: Acute pain of left knee  Unsteadiness on feet  Muscle weakness (generalized)     Problem List There are no problems to display for this patient.   Everlean Alstrom. Graylon Good, PT, DPT 06/13/19, 3:54 PM  [place] [facility] [address] [place], [address] Phone: [phone]   Fax:  [phone]  Name: Glenn Watson MRN: [id number] Date of Birth: [birth year]

## 2019-06-15 ENCOUNTER — Ambulatory Visit: Payer: PPO | Admitting: Physical Therapy

## 2019-06-15 ENCOUNTER — Other Ambulatory Visit: Payer: Self-pay

## 2019-06-15 ENCOUNTER — Encounter: Payer: Self-pay | Admitting: Physical Therapy

## 2019-06-15 DIAGNOSIS — M25562 Pain in left knee: Secondary | ICD-10-CM | POA: Diagnosis not present

## 2019-06-15 DIAGNOSIS — R2681 Unsteadiness on feet: Secondary | ICD-10-CM

## 2019-06-15 DIAGNOSIS — M6281 Muscle weakness (generalized): Secondary | ICD-10-CM

## 2019-06-15 NOTE — Therapy (Signed)
[place] [facility] [address], [place], [address] Phone: [phone]   Fax:  [phone]  Physical Therapy Treatment  Patient Details  Name: Glenn Watson MRN: [id number] Date of Birth: 1946/04/30 Referring Provider (PT): Altamese Wilmar, MD   Encounter Date: 06/15/2019  PT End of Session - 06/15/19 1120    Visit Number  17    Number of Visits  24    Date for PT Re-Evaluation  07/11/19    Authorization Type  [employer] reporting from 05/22/2019    Authorization - Visit Number  7    Authorization - Number of Visits  10    PT Start Time  1115    PT Stop Time  1210    PT Time Calculation (min)  55 min    Equipment Utilized During Treatment  Gait belt    Activity Tolerance  Patient tolerated treatment well    Behavior During Therapy  [facility] for tasks assessed/performed       Past Medical History:  Diagnosis Date  . Hypertension     Past Surgical History:  Procedure Laterality Date  . APPENDECTOMY  2011   Dr. Bary Castilla  . COLONOSCOPY WITH PROPOFOL N/A 02/10/2017   Procedure: COLONOSCOPY WITH PROPOFOL;  Surgeon: Christene Lye, MD;  Location: [facility] ENDOSCOPY;  Service: Endoscopy;  Laterality: N/A;    There were no vitals filed for this visit.  Subjective Assessment - 06/15/19 1118    Subjective  Patient reports his back is feeling a lot better today and he was able to return to walking with the cane this morning around the house (but brought RW in to the clinic). States he has maybe 2/10 pain during ambulation cycle in his back still. Feels like the stretches from last session he has been doing at home really help and he also started using a heating pad. Brought his large knee brace today instead of the small one because he doesn't quite feel confident without it. No falls since last sessionk.    Pertinent History  L ankle surgery; hypertension; history of falling; L knee pain.    Limitations   Lifting;Standing;Walking;House hold activities    Diagnostic tests  CT: Comminuted and impacted intra-articular fracture of the proximaltibia as described. This fracture involves both tibial plateaus andis associated with depression of the articular surface posteriorly. Mildly comminuted intra-articular fracture of the fibular headand neck. 04/07/2019    Patient Stated Goals  Able to walk without AD    Currently in Pain?  Yes    Pain Score  2     Pain Location  Back    Pain Onset  1 to 4 weeks ago        TREATMENT: denies sensitivity to latex  Therapeutic exercise:to centralize symptoms and improve ROM, strength, muscular endurance, and activity tolerance required for successful completion of functional activities.  (removed brace forfirst part of session)  -Recumbent Bikeseat setting 14. For improved lower extremity ROM, muscular endurance, and activity tolerance; and to induce the analgesic effect of aerobic exercise, stimulate joint nutrition, and prepare body structures and systems for following interventions, freely rotatingx52mnutesthen moved seat up to9and went back and forth in available ROM with mild flexion overpressure to improve flexionx458m.115degrees AAROM at conclusion of exercise.  - standing TKE against black theraband 2x20. Good quad contraction. Improved WB on L LE.  Notes feeling strong contraction at L quad.   (brace donned)   - hip machine abduction x20,  2x10 standing on R, moving L at 40#.   - Total gym L single leg squat , x10 at level5 (top of bar), 2x10 at level 7.  Min A to get in and out of machine.   Cuing throughout for proper form, strong quad contraction, monitoring for pain. Updated HEP with handout.   Gait training: for improved functional mobility in home and community.   (brace donned)   - pre-gait weight shifting in parallel bars with mirror feedback. Leaning L hip on L bar x 22 reps, trying to gently touch L hip to L  bar x 20 reps, then shifting without touching x 20 reps. with UE support as needed. Stopping and cuing frequently until understood exercise. to improve comfort with weight shift needed for ambulation   HOME EXERCISE PROGRAM Access Code: [email or web address] URL: [email or web address] Date: 06/06/2019  Prepared by: Rosita Kea   Exercises Supine Heel Slide with Strap - 1 sets - 20 reps - 3 seconds hold - 1x daily - 7x weekly Clamshell with Resistance - 3 sets - 20 reps - 1-3 second hold - 1x weekly Standing Terminal Knee Extension with Resistance - 30 reps - 5 second hold - 1-2x daily Standing March with Counter Support - 30 reps - 1-2x daily Squat with Counter Support - 3 sets - 10 reps - 1-2x daily Sit to Stand without Arm Support - 3 sets - 10 reps - 1-2x daily     PT Education - 06/15/19 1120    Education Details  exercise purpose/form. self management techniques.    Person(s) Educated  Patient    Methods  Explanation;Demonstration;Tactile cues;Verbal cues    Comprehension  Verbalized understanding;Returned demonstration;Verbal cues required;Tactile cues required;Need further instruction       PT Short Term Goals - 04/24/19 1426      PT SHORT TERM GOAL #1   Title  Be independent with initial home exercise program for self-management of symptoms.    Baseline  initial HEP provided at IE (04/18/2019);    Time  2    Period  Weeks    Status  Achieved    Target Date  05/02/19        PT Long Term Goals - 05/18/19 1936      PT LONG TERM GOAL #1   Title  Be independent with a long-term home exercise program for self-management of symptoms.    Baseline  initial HEP provided at IE (04/18/2019); continues to participate in currently appropriate HEP (05/18/2019);    Time  12    Period  Weeks    Status  Partially Met    Target Date  07/11/19      PT LONG TERM GOAL #2   Title  Demonstrate improved FOTO score by 10 units to demonstrate improvement in overall  condition and self-reported functional ability.    Baseline  44 (04/18/2019); 52 (05/18/2019)    Time  12    Period  Weeks    Status  Partially Met    Target Date  07/11/19      PT LONG TERM GOAL #3   Title  Complete community, work and/or recreational activities without limitation due to current condition.    Baseline  Difficulites with walking, standing, shopping, driving, cooking and bathing, currently NWB L LE (04/18/2019); continues with similar difficulties but is now PWB25% and is able to drive, cross legs to don shoes (05/18/2019);    Time  12    Period  Weeks    Status  Partially Met    Target Date  07/11/19      PT LONG TERM GOAL #4   Title  Increase L knee flexion AROM to 140 degrees to be able to complete ADLs without limitation of knee flexion    Baseline  PROM 95 degrees of flexion with pain at end range and AROM 80 degrees(04/18/2019); AAROM 110 with end range pain (05/18/2019);    Time  12    Period  Weeks    Status  Partially Met    Target Date  07/11/19      PT LONG TERM GOAL #5   Title  Pt will increase LE strength to equal or greater than 4+/5 MMT in order to demonstrate improvement in strength and function.    Baseline  See objectives on IE(04/18/2019); still 2/10 L knee extension (lacking last 22 degrees against gravity) (05/18/2019);    Time  12    Period  Weeks    Status  Partially Met    Target Date  07/11/19            Plan - 06/15/19 1748    Clinical Impression Statement  Patient tolerated treatment well and continues to make progress towards goals. Was able to ambulate better and with improved comfort this session but still dependent on RW for best stability. Session focused on weight shifting and LE strength to improve walking and functional strength. Started to fell a bit more comfortable with shifting to L side by end of session but continues to avoid it. Patient would benefit from continued physical therapy to address remaining impairments and  functional limitations to work towards stated goals and return to PLOF or maximal functional independence.    Personal Factors and Comorbidities  Comorbidity 2;Age    Comorbidities  L ankle surgery; hypertension; history of falling; L knee pain.    Examination-Activity Limitations  Bathing;Bed Mobility;Locomotion Level;Stairs;Stand;Hygiene/Grooming;Bend;Caring for Others;Sleep;Lift;Transfers;Toileting;Squat;Carry    Examination-Participation Restrictions  Yard Work;Driving;Meal Prep;Laundry;Shop;Cleaning    Stability/Clinical Decision Making  Evolving/Moderate complexity    Rehab Potential  Good    PT Frequency  2x / week    PT Duration  12 weeks    PT Treatment/Interventions  ADLs/Self Care Home Management;Cryotherapy;Moist Heat;Gait training;Stair training;Functional mobility training;Therapeutic activities;Therapeutic exercise;Balance training;Neuromuscular re-education;Manual techniques;Joint Manipulations;Passive range of motion;Dry needling;DME Instruction    PT Next Visit Plan  Strengthening, ROM and muscle endurance    PT Home Exercise Plan  Medbridge: [email or web address]    Consulted and Agree with Plan of Care  Patient       Patient will benefit from skilled therapeutic intervention in order to improve the following deficits and impairments:  Abnormal gait, Decreased endurance, Decreased activity tolerance, Decreased range of motion, Decreased strength, Pain, Impaired flexibility, Difficulty walking, Decreased balance, Impaired perceived functional ability, Decreased knowledge of use of DME, Decreased mobility  Visit Diagnosis: Acute pain of left knee  Unsteadiness on feet  Muscle weakness (generalized)     Problem List There are no problems to display for this patient.   Everlean Alstrom. Graylon Good, PT, DPT 06/15/19, 5:50 PM  [facility] [address], [place], [address] Phone: [phone]   Fax:  [phone]  Name:  JAMANI BEARCE MRN: [id number] Date of Birth: [birth year]

## 2019-06-19 ENCOUNTER — Encounter: Payer: Self-pay | Admitting: Physical Therapy

## 2019-06-19 ENCOUNTER — Ambulatory Visit: Payer: PPO | Admitting: Physical Therapy

## 2019-06-19 ENCOUNTER — Other Ambulatory Visit: Payer: Self-pay

## 2019-06-19 DIAGNOSIS — M25562 Pain in left knee: Secondary | ICD-10-CM | POA: Diagnosis not present

## 2019-06-19 DIAGNOSIS — M6281 Muscle weakness (generalized): Secondary | ICD-10-CM

## 2019-06-19 DIAGNOSIS — R2681 Unsteadiness on feet: Secondary | ICD-10-CM

## 2019-06-19 NOTE — Therapy (Signed)
[facility] [address], [place], [address] Phone: [phone]   Fax:  [phone]  Physical Therapy Treatment  Patient Details  Name: Glenn Watson MRN: [id number] Date of Birth: May 19, 1946 Referring Provider (PT): Altamese , MD   Encounter Date: 06/19/2019  PT End of Session - 06/19/19 1120    Visit Number  18    Number of Visits  24    Date for PT Re-Evaluation  07/11/19    Authorization Type  [employer] reporting from 05/22/2019    Authorization - Visit Number  8    Authorization - Number of Visits  10    PT Start Time  1115    PT Stop Time  1205    PT Time Calculation (min)  50 min    Equipment Utilized During Treatment  Gait belt    Activity Tolerance  Patient tolerated treatment well    Behavior During Therapy  [facility] for tasks assessed/performed       Past Medical History:  Diagnosis Date  . Hypertension     Past Surgical History:  Procedure Laterality Date  . APPENDECTOMY  2011   Dr. Bary Castilla  . COLONOSCOPY WITH PROPOFOL N/A 02/10/2017   Procedure: COLONOSCOPY WITH PROPOFOL;  Surgeon: Christene Lye, MD;  Location: [facility] ENDOSCOPY;  Service: Endoscopy;  Laterality: N/A;    There were no vitals filed for this visit.  Subjective Assessment - 06/19/19 1118    Subjective  Patient reports the back spasms have cleared up and he is getting around a lot better. He is using his cane inside the home. He brought it today so he can use it in the clinic. Reports no pain upon arrival and no pain or excessive soreness following last treatment session. HEP is going well.    Pertinent History  L ankle surgery; hypertension; history of falling; L knee pain.    Limitations  Lifting;Standing;Walking;House hold activities    Diagnostic tests  CT: Comminuted and impacted intra-articular fracture of the proximaltibia as described. This fracture involves both tibial plateaus andis  associated with depression of the articular surface posteriorly. Mildly comminuted intra-articular fracture of the fibular headand neck. 04/07/2019    Patient Stated Goals  Able to walk without AD    Currently in Pain?  No/denies    Pain Onset  1 to 4 weeks ago       TREATMENT: denies sensitivity to latex  Therapeutic exercise:to centralize symptoms and improve ROM, strength, muscular endurance, and activity tolerance required for successful completion of functional activities.  (removed brace forfirst part of session)  -Recumbent Bikeseat setting 14. For improved lower extremity ROM, muscular endurance, and activity tolerance; and to induce the analgesic effect of aerobic exercise, stimulate joint nutrition, and prepare body structures and systems for following interventions, freely rotatingx87mnutesthen moved seat up to9and went back and forth in available ROM with mild flexion overpressure to improve flexionx518m.115degrees AAROM at conclusion of exercise.  (brace donned)   - hip machine abduction 2x20 standing on R, moving L at 55#.  - hip machine L extension, x20 at 55#  - Total gym L single leg squat , x20 at level 7 (top of bar), x20 at level 9.  Min A to get in and out of machine.   Cuing throughout for proper form, strong quad contraction, monitoring for pain.Updated HEP with handout.   Gait training: forimproved functional mobility in home and community.   (brace donned)   -  ambulation with RW progressing to [facility] in front of mirror working on narrowing stance and improving L weight shifts. Back and forth x 20 feet at least 10 times. SBA for safety and multimodal cuing for improved gait.   Manual therapy: to reduce pain and tissue tension, improve range of motion, neuromodulation, in order to promote improved ability to complete functional activities. - PROM L knee  flexion and extension with OP to tolerance, 5 second hold, x20 each way. (127  degrees PROM flexion at end) - PROM L hamstring stretch 3x30 seconds. Patient able to get a better position and more intense stretch with clinician assistance.   HOME EXERCISE PROGRAM Access Code: [email or web address] URL: [email or web address] Date: 06/06/2019  Prepared by: Rosita Kea   Exercises Supine Heel Slide with Strap - 1 sets - 20 reps - 3 seconds hold - 1x daily - 7x weekly Clamshell with Resistance - 3 sets - 20 reps - 1-3 second hold - 1x weekly Standing Terminal Knee Extension with Resistance - 30 reps - 5 second hold - 1-2x daily Standing March with Counter Support - 30 reps - 1-2x daily Squat with Counter Support - 3 sets - 10 reps - 1-2x daily Sit to Stand without Arm Support - 3 sets - 10 reps - 1-2x daily     PT Education - 06/19/19 1120    Education Details  exercise purpose/form. self management techniques.    Person(s) Educated  Patient    Methods  Explanation;Demonstration;Tactile cues;Verbal cues    Comprehension  Verbalized understanding;Returned demonstration;Verbal cues required;Need further instruction;Tactile cues required       PT Short Term Goals - 04/24/19 1426      PT SHORT TERM GOAL #1   Title  Be independent with initial home exercise program for self-management of symptoms.    Baseline  initial HEP provided at IE (04/18/2019);    Time  2    Period  Weeks    Status  Achieved    Target Date  05/02/19        PT Long Term Goals - 05/18/19 1936      PT LONG TERM GOAL #1   Title  Be independent with a long-term home exercise program for self-management of symptoms.    Baseline  initial HEP provided at IE (04/18/2019); continues to participate in currently appropriate HEP (05/18/2019);    Time  12    Period  Weeks    Status  Partially Met    Target Date  07/11/19      PT LONG TERM GOAL #2   Title  Demonstrate improved FOTO score by 10 units to demonstrate improvement in overall condition and self-reported functional  ability.    Baseline  44 (04/18/2019); 52 (05/18/2019)    Time  12    Period  Weeks    Status  Partially Met    Target Date  07/11/19      PT LONG TERM GOAL #3   Title  Complete community, work and/or recreational activities without limitation due to current condition.    Baseline  Difficulites with walking, standing, shopping, driving, cooking and bathing, currently NWB L LE (04/18/2019); continues with similar difficulties but is now PWB25% and is able to drive, cross legs to don shoes (05/18/2019);    Time  12    Period  Weeks    Status  Partially Met    Target Date  07/11/19      PT LONG TERM GOAL #4   Title  Increase L knee flexion AROM to 140 degrees to be able to complete ADLs without limitation of knee flexion    Baseline  PROM 95 degrees of flexion with pain at end range and AROM 80 degrees(04/18/2019); AAROM 110 with end range pain (05/18/2019);    Time  12    Period  Weeks    Status  Partially Met    Target Date  07/11/19      PT LONG TERM GOAL #5   Title  Pt will increase LE strength to equal or greater than 4+/5 MMT in order to demonstrate improvement in strength and function.    Baseline  See objectives on IE(04/18/2019); still 2/10 L knee extension (lacking last 22 degrees against gravity) (05/18/2019);    Time  12    Period  Weeks    Status  Partially Met    Target Date  07/11/19            Plan - 06/19/19 1428    Clinical Impression Statement  Patient tolerated treatment well and continues to make good progress towards goals. Demonstrated improved gait mechanics today and was able to progress resistance during exercise and knee ROM following manual. Pt required multimodal cuing for proper technique and to facilitate improved neuromuscular control, strength, range of motion, and functional ability resulting in improved performance and form. Patient would benefit from continued physical therapy to address remaining impairments and functional limitations to work  towards stated goals and return to PLOF or maximal functional independence.    Personal Factors and Comorbidities  Comorbidity 2;Age    Comorbidities  L ankle surgery; hypertension; history of falling; L knee pain.    Examination-Activity Limitations  Bathing;Bed Mobility;Locomotion Level;Stairs;Stand;Hygiene/Grooming;Bend;Caring for Others;Sleep;Lift;Transfers;Toileting;Squat;Carry    Examination-Participation Restrictions  Yard Work;Driving;Meal Prep;Laundry;Shop;Cleaning    Stability/Clinical Decision Making  Evolving/Moderate complexity    Rehab Potential  Good    PT Frequency  2x / week    PT Duration  12 weeks    PT Treatment/Interventions  ADLs/Self Care Home Management;Cryotherapy;Moist Heat;Gait training;Stair training;Functional mobility training;Therapeutic activities;Therapeutic exercise;Balance training;Neuromuscular re-education;Manual techniques;Joint Manipulations;Passive range of motion;Dry needling;DME Instruction    PT Next Visit Plan  Strengthening, ROM and muscle endurance    PT Home Exercise Plan  Medbridge: [email or web address]    Consulted and Agree with Plan of Care  Patient       Patient will benefit from skilled therapeutic intervention in order to improve the following deficits and impairments:  Abnormal gait, Decreased endurance, Decreased activity tolerance, Decreased range of motion, Decreased strength, Pain, Impaired flexibility, Difficulty walking, Decreased balance, Impaired perceived functional ability, Decreased knowledge of use of DME, Decreased mobility  Visit Diagnosis: Acute pain of left knee  Unsteadiness on feet  Muscle weakness (generalized)     Problem List There are no problems to display for this patient.   Everlean Alstrom. Graylon Good, PT, DPT 06/19/19, 2:29 PM  [place] [facility] [address] [place], [address] Phone: [phone]   Fax:  [phone]  Name: WORTHINGTON CRUZAN MRN:  [id number] Date of Birth: October 12, 1945

## 2019-06-21 ENCOUNTER — Encounter: Payer: PPO | Admitting: Physical Therapy

## 2019-06-26 ENCOUNTER — Ambulatory Visit: Payer: PPO | Admitting: Physical Therapy

## 2019-06-26 ENCOUNTER — Encounter: Payer: Self-pay | Admitting: Physical Therapy

## 2019-06-26 ENCOUNTER — Other Ambulatory Visit: Payer: Self-pay

## 2019-06-26 DIAGNOSIS — M6281 Muscle weakness (generalized): Secondary | ICD-10-CM

## 2019-06-26 DIAGNOSIS — M25562 Pain in left knee: Secondary | ICD-10-CM

## 2019-06-26 DIAGNOSIS — R2681 Unsteadiness on feet: Secondary | ICD-10-CM

## 2019-06-26 NOTE — Therapy (Signed)
[place] [facility] [address] [place], [address] Phone: [phone]   Fax:  [phone]  Physical Therapy Treatment  Patient Details  Name: Glenn Watson MRN: [id number] Date of Birth: 1946/06/10 Referring Provider (PT): Altamese Dale, MD   Encounter Date: 06/26/2019  PT End of Session - 06/26/19 1124    Visit Number  9    Number of Visits  24    Date for PT Re-Evaluation  07/11/19    Authorization Type  [employer] reporting from 05/22/2019    Authorization - Visit Number  9    Authorization - Number of Visits  10    PT Start Time  1115    PT Stop Time  1200    PT Time Calculation (min)  45 min    Equipment Utilized During Treatment  Gait belt    Activity Tolerance  Patient tolerated treatment well    Behavior During Therapy  [facility] for tasks assessed/performed       Past Medical History:  Diagnosis Date  . Hypertension     Past Surgical History:  Procedure Laterality Date  . APPENDECTOMY  2011   Dr. Bary Castilla  . COLONOSCOPY WITH PROPOFOL N/A 02/10/2017   Procedure: COLONOSCOPY WITH PROPOFOL;  Surgeon: Christene Lye, MD;  Location: [facility] ENDOSCOPY;  Service: Endoscopy;  Laterality: N/A;    There were no vitals filed for this visit.  Subjective Assessment - 06/26/19 1121    Subjective  Patient reports his R sided back spasm has returned when he awoke on Saturday night. It is painful to about 6/10 when he puts more pressure through the cane/RW on the R side. He was using the cane a lot over the weekend. He did his back exerciases and some medication last night that allowed him to sleep. He reports he has switched to the small brace now. Continues to feel like he is hesitant to put a lot of weight on L LE.    Pertinent History  L ankle surgery; hypertension; history of falling; L knee pain.    Limitations  Lifting;Standing;Walking;House hold activities    Diagnostic tests  CT: Comminuted  and impacted intra-articular fracture of the proximaltibia as described. This fracture involves both tibial plateaus andis associated with depression of the articular surface posteriorly. Mildly comminuted intra-articular fracture of the fibular headand neck. 04/07/2019    Patient Stated Goals  Able to walk without AD    Currently in Pain?  Yes    Pain Score  6     Pain Location  Back    Pain Orientation  Right    Pain Onset  1 to 4 weeks ago       TREATMENT: denies sensitivity to latex  Therapeutic exercise:to centralize symptoms and improve ROM, strength, muscular endurance, and activity tolerance required for successful completion of functional activities.  (removed brace for bike and manual)  -Recumbent Bikeseat setting 14. For improved lower extremity ROM, muscular endurance, and activity tolerance; and to induce the analgesic effect of aerobic exercise, stimulate joint nutrition, and prepare body structures and systems for following interventions, freely rotatingx17mnutes.  - hooklying low trunk rotation x 2 min with moist heat placed behind painful region on lower thoracic spine.   Gait training: forimproved functional mobility in home and community.   (brace donned)  - ambulation x 4 sets back and forth 25 feet each way with RW progressing to [facility] and L UE working on narrowing  stance and improving L weight shifts while adapting to back pain. CGA to min A (during one stumble) for safety and multimodal cuing for improved gait.   Manual therapy: to reduce pain and tissue tension, improve range of motion, neuromodulation, in order to promote improved ability to complete functional activities. - prone CPA and R UPA to lower thoracic and lumbar spine. No effect on pain.  - prone STM with trigger point release to epicenter of pain at R lower thoracic paraspinals using fingers, elbow at times. Pt reported some relief following.   Hooklying with moist heat over R  lumbar and lower thoracic region (site of pain) during manual:  - PROM L hamstring stretch 3x30 seconds. Patient able to get a better position and more intense stretch with clinician assistance.  - PROM L knee  flexion and extension with OP to tolerance, 5 second hold, x20 each way. (120 degrees PROM flexion with ERP, following manual)  HOME EXERCISE PROGRAM Access Code: [email or web address] URL: [email or web address] Date: 06/06/2019  Prepared by: Rosita Kea   Exercises Supine Heel Slide with Strap - 1 sets - 20 reps - 3 seconds hold - 1x daily - 7x weekly Clamshell with Resistance - 3 sets - 20 reps - 1-3 second hold - 1x weekly Standing Terminal Knee Extension with Resistance - 30 reps - 5 second hold - 1-2x daily Standing March with Counter Support - 30 reps - 1-2x daily Squat with Counter Support - 3 sets - 10 reps - 1-2x daily Sit to Stand without Arm Support - 3 sets - 10 reps - 1-2x daily    PT Education - 06/26/19 1124    Education Details  exercise purpose/form. self management techniques.    Person(s) Educated  Patient    Methods  Explanation;Demonstration;Tactile cues;Verbal cues    Comprehension  Verbalized understanding;Returned demonstration;Verbal cues required;Tactile cues required;Need further instruction       PT Short Term Goals - 04/24/19 1426      PT SHORT TERM GOAL #1   Title  Be independent with initial home exercise program for self-management of symptoms.    Baseline  initial HEP provided at IE (04/18/2019);    Time  2    Period  Weeks    Status  Achieved    Target Date  05/02/19        PT Long Term Goals - 05/18/19 1936      PT LONG TERM GOAL #1   Title  Be independent with a long-term home exercise program for self-management of symptoms.    Baseline  initial HEP provided at IE (04/18/2019); continues to participate in currently appropriate HEP (05/18/2019);    Time  12    Period  Weeks    Status  Partially Met    Target  Date  07/11/19      PT LONG TERM GOAL #2   Title  Demonstrate improved FOTO score by 10 units to demonstrate improvement in overall condition and self-reported functional ability.    Baseline  44 (04/18/2019); 52 (05/18/2019)    Time  12    Period  Weeks    Status  Partially Met    Target Date  07/11/19      PT LONG TERM GOAL #3   Title  Complete community, work and/or recreational activities without limitation due to current condition.    Baseline  Difficulites with walking, standing, shopping, driving, cooking and bathing, currently NWB L LE (04/18/2019); continues with similar difficulties but is now PWB25%  and is able to drive, cross legs to don shoes (05/18/2019);    Time  12    Period  Weeks    Status  Partially Met    Target Date  07/11/19      PT LONG TERM GOAL #4   Title  Increase L knee flexion AROM to 140 degrees to be able to complete ADLs without limitation of knee flexion    Baseline  PROM 95 degrees of flexion with pain at end range and AROM 80 degrees(04/18/2019); AAROM 110 with end range pain (05/18/2019);    Time  12    Period  Weeks    Status  Partially Met    Target Date  07/11/19      PT LONG TERM GOAL #5   Title  Pt will increase LE strength to equal or greater than 4+/5 MMT in order to demonstrate improvement in strength and function.    Baseline  See objectives on IE(04/18/2019); still 2/10 L knee extension (lacking last 22 degrees against gravity) (05/18/2019);    Time  12    Period  Weeks    Status  Partially Met    Target Date  07/11/19            Plan - 06/26/19 1220    Clinical Impression Statement  Patient tolerated treatment well overall and reported reduction in back pain from 6/10 to 2/10 with manual therapy, moist heat, and LTR targeted at decreasing muscle tension in the R lower thoracic paraspinals that appeared to be the source of patient's back pain. Visit focused on improving ambulation tolerance by lowering back pain, altering gait  pattern to decrease dependence on pain provoking activity (pressing down through R UE through AD), and continuing to improve ROM. Patient continues to have strength, ROM, and gait deficits that are restricting his ability to perform basic ambulation, ADL, and IADLs. Patient would benefit from continued physical therapy to address remaining impairments and functional limitations to work towards stated goals and return to PLOF or maximal functional independence.    Personal Factors and Comorbidities  Comorbidity 2;Age    Comorbidities  L ankle surgery; hypertension; history of falling; L knee pain.    Examination-Activity Limitations  Bathing;Bed Mobility;Locomotion Level;Stairs;Stand;Hygiene/Grooming;Bend;Caring for Others;Sleep;Lift;Transfers;Toileting;Squat;Carry    Examination-Participation Restrictions  Yard Work;Driving;Meal Prep;Laundry;Shop;Cleaning    Stability/Clinical Decision Making  Evolving/Moderate complexity    Rehab Potential  Good    PT Frequency  2x / week    PT Duration  12 weeks    PT Treatment/Interventions  ADLs/Self Care Home Management;Cryotherapy;Moist Heat;Gait training;Stair training;Functional mobility training;Therapeutic activities;Therapeutic exercise;Balance training;Neuromuscular re-education;Manual techniques;Joint Manipulations;Passive range of motion;Dry needling;DME Instruction    PT Next Visit Plan  Strengthening, ROM and muscle endurance    PT Home Exercise Plan  Medbridge: [email or web address]    Consulted and Agree with Plan of Care  Patient       Patient will benefit from skilled therapeutic intervention in order to improve the following deficits and impairments:  Abnormal gait, Decreased endurance, Decreased activity tolerance, Decreased range of motion, Decreased strength, Pain, Impaired flexibility, Difficulty walking, Decreased balance, Impaired perceived functional ability, Decreased knowledge of use of DME, Decreased mobility  Visit Diagnosis: Acute pain of left  knee  Unsteadiness on feet  Muscle weakness (generalized)     Problem List There are no problems to display for this patient.   Everlean Alstrom. Graylon Good, PT, DPT 06/26/19, 12:21 PM  [facility] [address]  [place], [place], [address] Phone: [phone]   Fax:  [phone]  Name: Glenn Watson MRN: [id number] Date of Birth: April 30, 1946

## 2019-06-29 ENCOUNTER — Other Ambulatory Visit: Payer: Self-pay

## 2019-06-29 ENCOUNTER — Ambulatory Visit: Payer: PPO | Admitting: Physical Therapy

## 2019-06-29 DIAGNOSIS — M25562 Pain in left knee: Secondary | ICD-10-CM

## 2019-06-29 DIAGNOSIS — M6281 Muscle weakness (generalized): Secondary | ICD-10-CM

## 2019-06-29 DIAGNOSIS — R2681 Unsteadiness on feet: Secondary | ICD-10-CM

## 2019-06-29 NOTE — Therapy (Signed)
[facility] [address], [place], [address] Phone: [phone]   Fax:  [phone]  Physical Therapy Treatment / Progress Note / Re-Certification Reporting period 05/18/2019 - 06/29/2019  Patient Details  Name: Glenn Watson MRN: [id number] Date of Birth: 12-Mar-1946 Referring Provider (PT): Altamese Arnold, MD   Encounter Date: 06/29/2019  PT End of Session - 06/29/19 1038    Visit Number  20    Number of Visits  44    Date for PT Re-Evaluation  09/21/19    Authorization Type  [employer] (new reporting period from 06/29/2019)    Authorization - Visit Number  10    Authorization - Number of Visits  10    PT Start Time  1030    PT Stop Time  1145    PT Time Calculation (min)  75 min    Equipment Utilized During Treatment  Gait belt    Activity Tolerance  Patient tolerated treatment well    Behavior During Therapy  [facility] for tasks assessed/performed       Past Medical History:  Diagnosis Date  . Hypertension     Past Surgical History:  Procedure Laterality Date  . APPENDECTOMY  2011   Dr. Bary Castilla  . COLONOSCOPY WITH PROPOFOL N/A 02/10/2017   Procedure: COLONOSCOPY WITH PROPOFOL;  Surgeon: Christene Lye, MD;  Location: [facility] ENDOSCOPY;  Service: Endoscopy;  Laterality: N/A;    There were no vitals filed for this visit.  Subjective Assessment - 06/29/19 1031    Subjective  Patient reports he is about 50% better concerning his R sided low back pain. He has been using his RW for ambulation and continues to do his exercises for the back, which he states help him immensely. He reports his mobility has increased over the last few weeks and he can do more with his leg, for example, bathing has become easier. He can now take a hsower on his own in about 10 min. He is now able to ambulate in the home at times with a SPC. His back pain is restricting him some and he has not returned to  ambulation without an AD.    Pertinent History  L ankle surgery; hypertension; history of falling; L knee pain.    Limitations  Lifting;Standing;Walking;House hold activities    How long can you stand comfortably?  can do dishes    How long can you walk comfortably?  walks around outside some (patio and walkway to the patio - can get there without any problem): thinks he could make it about 15 min    Diagnostic tests  CT: Comminuted and impacted intra-articular fracture of the proximaltibia as described. This fracture involves both tibial plateaus andis associated with depression of the articular surface posteriorly. Mildly comminuted intra-articular fracture of the fibular headand neck. 04/07/2019    Patient Stated Goals  Able to walk without AD    Currently in Pain?  Yes    Pain Location  Back    Pain Orientation  Right    Pain Descriptors / Indicators  Sharp    Pain Type  Acute pain    Pain Onset  1 to 4 weeks ago    Pain Frequency  Intermittent    Aggravating Factors   walking without AD, rotating spine to reach, moving quickly    Pain Relieving Factors  certain positions, using RW    Effect of Pain on Daily Activities  decreased ability to  complete ambulation and interfers with rehab activities for the L knee.        [facility] PT Assessment - 06/29/19 0001      Assessment   Medical Diagnosis   Tib/Fib Fx L leg    Referring Provider (PT)  Altamese [place], MD    Onset Date/Surgical Date  03/27/19    Hand Dominance  Right    Next MD Visit  --      Precautions   Required Braces or Orthoses  Other Brace/Splint   when weight bearing   Other Brace/Splint  small hinged knee brace      Restrictions   Weight Bearing Restrictions  Yes    LLE Weight Bearing  Weight bearing as tolerated    LLE Partial Weight Bearing Percentage or Pounds  --      Balance Screen   Has the patient fallen in the past 6 months  Yes    How many times?  1    Has the patient had a decrease in activity level  because of a fear of falling?   Yes    Is the patient reluctant to leave their home because of a fear of falling?   Yes      [place] residence    Living Arrangements  Spouse/significant other    Available Help at Discharge  Family    Type of [place] to enter;Level entry    [place]  Two level    Alternate Level Stairs-Number of Steps  --   [place] - 2 wheels;Crutches      Prior Function   Level of Independence  Independent    Vocation  Retired    Leisure  [employer], [place], reading      Cognition   Overall Cognitive Status  Within Functional Limits for tasks assessed      Observation/Other Assessments   Observations  see note from 06/29/2019 for latest objective data    Focus on Therapeutic Outcomes (FOTO)   FOTO = 52 (06/29/2019)       [facility] PT Assessment - 06/29/19 0001      Assessment   Medical Diagnosis   Tib/Fib Fx L leg    Referring Provider (PT)  Altamese [place], MD    Onset Date/Surgical Date  03/27/19    Hand Dominance  Right    Next MD Visit  --      Precautions   Required Braces or Orthoses  Other Brace/Splint   when weight bearing   Other Brace/Splint  small hinged knee brace      Restrictions   Weight Bearing Restrictions  Yes    LLE Weight Bearing  Weight bearing as tolerated    LLE Partial Weight Bearing Percentage or Pounds  --      Balance Screen   Has the patient fallen in the past 6 months  Yes    How many times?  1    Has the patient had a decrease in activity level because of a fear of falling?   Yes    Is the patient reluctant to leave their home because of a fear of falling?   Yes      [place]  Private residence    Living Arrangements  Spouse/significant other    Available Help at Discharge  Family  Type of [place] to enter;Level entry    [place]  Two level    Alternate Level Stairs-Number of Steps  --   13   Alternate Level Stairs-Rails  Right    Home Equipment  Walker - 2 wheels;Crutches      Prior Function   Level of Independence  Independent    Vocation  Retired    Radio producer, cooking, reading      Cognition   Overall Cognitive Status  Within Functional Limits for tasks assessed      Observation/Other Assessments   Observations  see note from 06/29/2019 for latest objective data    Focus on Therapeutic Outcomes (FOTO)   FOTO = 52 (06/29/2019)     OBJECTIVE  FOTO = 52  MUSCULOSKELETAL: Tremor: Absent Bulk:significantL LE swellingknee to ankle, improved per observation and ability to get 20/30 compression stocking on more asily (only able to 15/20 at IE) LE circumference(10 cmproximal fromheel):  L LE:28 cm  R LE:26 cm  Posture No gross abnormalities noted in standing or seated posture.  Gait Ambulated with rolling walker and WBAT bearing at L LE household and short community distances. Has been able to walk at home with [facility] and in clinic but not currently due to back pain.   Transfers: - sit <> stand from chair and low surface with mod I using RW  Bed mobility: - rolling and supine <>sit I  Muscle performance (MMT) R/L  HIP 4+/3+ Hip extension (pressure on L) 3+/3 Hip abduction (sidelying)  5/4*Hip extension (prone knee extended) 5/4*Hip extension (prone knee flexed to bias glute max)  KNEE 5/4Knee extension (available range, L limited due to discomfort at proximal tibia) 5/4+Knee flexion (available range, L limited due to discomfort at proximal tibia)  AROM R/L 0/-25 Knee extension (seated position)   AAROM (on bike with self overpressure) L knee: 115 =  After end range stretching  PROM R/L KNEE 0/0  extension with heels elevated 135/125 flexion with pain limiting at L lateral posterior knee  Objective measurements completed on  examination: See above findings.     TREATMENT: denies sensitivity to latex  Therapeutic exercise:to centralize symptoms and improve ROM, strength, muscular endurance, and activity tolerance required for successful completion of functional activities.  (removed brace for bike and manual)  -Recumbent Bikeseat setting 14. For improved lower extremity ROM, muscular endurance, and activity tolerance; and to induce the analgesic effect of aerobic exercise, stimulate joint nutrition, and prepare body structures and systems for following interventions, freely [id number].  - examination to assess progress (see above)   - attempted standing hip abduction, extension, flexion against green theraband around distal thighs. x20 each direction. Only able to perform properly moving L LE with significant clinician support to improve alignment. Attempted without band moving R LE but unable to weight bear on L LE adequately for success.   - Total gymLsingle leg squat , 3x20 at level 9 (top of bar). 1 min break between 1st and 2nd set, 2 min break between 2nd and 3rd set. Min A to get in and out of machine.   - supine bridge variations to improve L hip extension and abduction for improved wight shift and SLS: standard x20, with green band around knees x 20, single leg x 20 R x 10 L (discontinued due to increasing back pain).  Isometric bridge with hip abductiuon against band: green x20, black x 20. Added to HEP.  Manual therapy:to reduce pain and tissue tension, improve range of motion, neuromodulation, in order to promote improved ability to complete functional activities..  - L patellar joint mobs all directions grade II-III to improve knee motion.  - PROM L knee  flexion and extension with OP to tolerance, 5 second hold, x20 each way. (126 degrees PROM flexion at end)  HOME EXERCISE PROGRAM Access Code: [email or web address]  URL: [email or web address]  Date: 06/29/2019   Prepared by: Rosita Kea   Exercises Standing Terminal Knee Extension with Resistance - 30 reps - 5 second hold - 1-2x daily Bridge with Hip Abduction and Resistance - 3 sets - 20 reps - 2x daily - 7x weekly Sidelying Thoracic Rotation with Open Book - 20 reps Supine Lower Trunk Rotation - 20 reps Prone Press Up - 20 reps - 1 second hold    PT Education - 06/29/19 1226    Education Details  exercise purpose/form. self management techniques.    Person(s) Educated  Patient    Methods  Explanation;Demonstration;Tactile cues;Verbal cues;Handout    Comprehension  Verbalized understanding;Returned demonstration;Verbal cues required;Tactile cues required       PT Short Term Goals - 04/24/19 1426      PT SHORT TERM GOAL #1   Title  Be independent with initial home exercise program for self-management of symptoms.    Baseline  initial HEP provided at IE (04/18/2019);    Time  2    Period  Weeks    Status  Achieved    Target Date  05/02/19        PT Long Term Goals - 06/29/19 1222      PT LONG TERM GOAL #1   Title  Be independent with a long-term home exercise program for self-management of symptoms.    Baseline  initial HEP provided at IE (04/18/2019); continues to participate in currently appropriate HEP (05/18/2019; 06/29/2019);    Time  12    Period  Weeks    Status  Partially Met    Target Date  09/21/19      PT LONG TERM GOAL #2   Title  Demonstrate improved FOTO score by 10 units to demonstrate improvement in overall condition and self-reported functional ability.    Baseline  44 (04/18/2019); 52 (05/18/2019); 52 (06/29/2019);    Time  12    Period  Weeks    Status  Partially Met    Target Date  09/21/19      PT LONG TERM GOAL #3   Title  Complete community, work and/or recreational activities without limitation due to current condition.    Baseline  Difficulites with walking, standing, shopping, driving, cooking and bathing, currently NWB L LE (04/18/2019);  continues with similar difficulties but is now PWB25% and is able to drive, cross legs to don shoes (05/18/2019); improving but continues to have limitations and cannot perform activities that require single leg stance full weight bearing (06/29/2019);    Time  12    Period  Weeks    Status  Partially Met    Target Date  09/21/19      PT LONG TERM GOAL #4   Title  Increase L knee flexion AROM to 140 degrees to be able to complete ADLs without limitation of knee flexion    Baseline  PROM 95 degrees of flexion with pain at end range and AROM 80 degrees(04/18/2019); AAROM 110 with end range pain (05/18/2019); PROM 0-126, AROM ext -25, AAROM flex 115 (06/29/2019);    Time  12    Period  Weeks    Status  Partially Met    Target Date  09/21/19      PT LONG TERM GOAL #5   Title  Pt will increase LE strength to equal or greater than 4+/5 MMT in order to demonstrate improvement in strength and function.    Baseline  See objectives on IE(04/18/2019); still 2/10 L knee extension (lacking last 22 degrees against gravity) (05/18/2019); continues to lack terminal knee extension in open change position, improved to at least 4/5 with discomfort within available range (06/29/2019);    Time  12    Period  Weeks    Status  Partially Met    Target Date  09/21/19        Plan - 06/29/19 1235    Clinical Impression Statement  Patient has completed 20 physical therapy treatment sessions and continues to make steady progress towards goals. Objectively, he has improved in ROM, strength, self-management techniques, activity tolerance, ambulation and bed mobility. Subjectively, patient reports decreased difficulty with daily activities including showering, ambulation with more weight bearing and less restrictive AD, decreased pain in his knee with more activity. FOTO score has improved from 44 to 52 since initial evaluation reflecting improved self-reported function. Patient has been hindered in the last few weeks by  episodes of R sided low back pain that prevents him from transitioning more quickly to cane because ambulating without the RW causes increased pain. This pain has improved and recurred and seems aggravated by progressions to less restrictive AD and likely related to poor trunk and hip alignment related to disuse weakness/atrophy. Patient has progress to less weight bearing restrictions and is working towards regaining strength, balance, weight bearing tolerance, and function. Patient continues to have weight bearing restrictions, ROM, strength, proprioception, balance, and activity tolerance impairments that are limiting his ability to complete ADLs, IADLs, and basic household and community mobility or perform activities a previous functional level. Patient would benefit from continued physical therapy as outlined in initial POC to address remaining impairments and functional limitations to work towards stated goals and return to PLOF or maximal functional independence.    Personal Factors and Comorbidities  Comorbidity 2;Age    Comorbidities  L ankle surgery; hypertension; history of falling; L knee pain.    Examination-Activity Limitations  Bathing;Bed Mobility;Locomotion Level;Stairs;Stand;Hygiene/Grooming;Bend;Caring for Others;Sleep;Lift;Transfers;Toileting;Squat;Carry    Examination-Participation Restrictions  Yard Work;Driving;Meal Prep;Laundry;Shop;Cleaning    Stability/Clinical Decision Making  Evolving/Moderate complexity    Rehab Potential  Good    PT Frequency  2x / week    PT Duration  12 weeks    PT Treatment/Interventions  ADLs/Self Care Home Management;Cryotherapy;Moist Heat;Gait training;Stair training;Functional mobility training;Therapeutic activities;Therapeutic exercise;Balance training;Neuromuscular re-education;Manual techniques;Joint Manipulations;Passive range of motion;Dry needling;DME Instruction    PT Next Visit Plan  Strengthening, ROM and muscle endurance, gait training    PT  Home Exercise Plan  Medbridge: [email or web address]    Consulted and Agree with Plan of Care  Patient       Patient will benefit from skilled therapeutic intervention in order to improve the following deficits and impairments:  Abnormal gait, Decreased endurance, Decreased activity tolerance, Decreased range of motion, Decreased strength, Pain, Impaired flexibility, Difficulty walking, Decreased balance, Impaired perceived functional ability, Decreased knowledge of use of DME, Decreased mobility  Visit Diagnosis: Acute pain of left knee  Unsteadiness on feet  Muscle weakness (generalized)     Problem List There are no problems to display for this patient.   Everlean Alstrom. Graylon Good,  PT, DPT 06/29/19, 12:36 PM  [place] [facility] [address] [place], [address] Phone: [phone]   Fax:  [phone]  Name: KERT SHACKETT MRN: [id number] Date of Birth: 17-Jun-1946

## 2019-07-03 ENCOUNTER — Other Ambulatory Visit: Payer: Self-pay

## 2019-07-03 ENCOUNTER — Ambulatory Visit: Payer: PPO | Attending: Orthopedic Surgery | Admitting: Physical Therapy

## 2019-07-03 ENCOUNTER — Encounter: Payer: Self-pay | Admitting: Physical Therapy

## 2019-07-03 DIAGNOSIS — R2681 Unsteadiness on feet: Secondary | ICD-10-CM | POA: Insufficient documentation

## 2019-07-03 DIAGNOSIS — M6281 Muscle weakness (generalized): Secondary | ICD-10-CM | POA: Diagnosis not present

## 2019-07-03 DIAGNOSIS — M25562 Pain in left knee: Secondary | ICD-10-CM | POA: Diagnosis not present

## 2019-07-03 DIAGNOSIS — S82222A Displaced transverse fracture of shaft of left tibia, initial encounter for closed fracture: Secondary | ICD-10-CM | POA: Diagnosis not present

## 2019-07-03 NOTE — Therapy (Signed)
[facility] [address], [place], [address] Phone: [phone]   Fax:  [phone]  Physical Therapy Treatment  Patient Details  Name: Glenn Watson MRN: [id number] Date of Birth: 20-Jun-1946 Referring Provider (PT): Altamese Barneston, MD   Encounter Date: 07/03/2019  PT End of Session - 07/03/19 0905    Visit Number  21    Number of Visits  44    Date for PT Re-Evaluation  09/21/19    Authorization Type  [employer] (new reporting period from 06/29/2019)    Authorization - Visit Number  1    Authorization - Number of Visits  10    PT Start Time  0904    PT Stop Time  0942    PT Time Calculation (min)  38 min    Equipment Utilized During Treatment  Gait belt    Activity Tolerance  Patient tolerated treatment well    Behavior During Therapy  [facility] for tasks assessed/performed       Past Medical History:  Diagnosis Date  . Hypertension     Past Surgical History:  Procedure Laterality Date  . APPENDECTOMY  2011   Dr. Bary Castilla  . COLONOSCOPY WITH PROPOFOL N/A 02/10/2017   Procedure: COLONOSCOPY WITH PROPOFOL;  Surgeon: Christene Lye, MD;  Location: [facility] ENDOSCOPY;  Service: Endoscopy;  Laterality: N/A;    There were no vitals filed for this visit.  Subjective Assessment - 07/03/19 0906    Subjective  Patient reports his back is continuing to improve but the pain is not gone. He has been using his cane and walker intermittantly. He used the recumbent bike at the gym yesterday. Had no pain or soreness following last session (in the leg). He has a follow up with Dr. Earnestine Leys later today.    Pertinent History  L ankle surgery; hypertension; history of falling; L knee pain.    Limitations  Lifting;Standing;Walking;House hold activities    How long can you stand comfortably?  can do dishes    How long can you walk comfortably?  walks around outside some (patio and walkway to the patio -  can get there without any problem): thinks he could make it about 15 min    Diagnostic tests  CT: Comminuted and impacted intra-articular fracture of the proximaltibia as described. This fracture involves both tibial plateaus andis associated with depression of the articular surface posteriorly. Mildly comminuted intra-articular fracture of the fibular headand neck. 04/07/2019    Patient Stated Goals  Able to walk without AD    Currently in Pain?  Yes    Pain Score  1     Pain Location  Back    Pain Orientation  Right    Pain Onset  1 to 4 weeks ago       TREATMENT: denies sensitivity to latex  Therapeutic exercise:to centralize symptoms and improve ROM, strength, muscular endurance, and activity tolerance required for successful completion of functional activities.  -Recumbent Bikeseat setting 14. For improved lower extremity ROM, muscular endurance, and activity tolerance; and to induce the analgesic effect of aerobic exercise, stimulate joint nutrition, and prepare body structures and systems for following interventions, freely rotatingx81mnutes during subjective exam. moved seat up to level 9 with back and forth rotations with end range stretch on 5 min.   (manual therapy - see below)   - hooklying bridge to isometric hip abduction against manual resistance 2-5 second holds x20.  - L single  leg bridge with clinician stabilizing L foot and providing tactile facilitation at L glute and mild resistance at lateral knee to improve glute contraction. 2x10. Mild increase in R low back pain that subsided with rest.   - Total gymLsingle leg squat , 3x10at level 11(top of bar). SBA for safety to get in and out of machine.   Manual therapy:to reduce pain and tissue tension, improve range of motion, neuromodulation, in order to promote improved ability to complete functional activities..  - L patellar joint mobs all directions grade II-III to improve knee motion.  - PROML  kneeflexion and extension with OP to tolerance, 5 second hold, x20 each way. (127 degrees PROM flexion at end)  HOME EXERCISE PROGRAM Access Code: [email or web address]      URL: [email or web address]    Date: 06/29/2019  Prepared by: Rosita Kea   Exercises Standing Terminal Knee Extension with Resistance - 30 reps - 5 second hold - 1-2x daily Bridge with Hip Abduction and Resistance - 3 sets - 20 reps - 2x daily - 7x weekly Sidelying Thoracic Rotation with Open Book - 20 reps Supine Lower Trunk Rotation - 20 reps Prone Press Up - 20 reps - 1 second hold   PT Education - 07/03/19 0910    Education Details  exercise purpose/form. self management techniques    Person(s) Educated  Patient    Methods  Explanation;Demonstration;Tactile cues;Verbal cues    Comprehension  Verbalized understanding;Returned demonstration;Verbal cues required;Tactile cues required;Need further instruction       PT Short Term Goals - 04/24/19 1426      PT SHORT TERM GOAL #1   Title  Be independent with initial home exercise program for self-management of symptoms.    Baseline  initial HEP provided at IE (04/18/2019);    Time  2    Period  Weeks    Status  Achieved    Target Date  05/02/19        PT Long Term Goals - 06/29/19 1222      PT LONG TERM GOAL #1   Title  Be independent with a long-term home exercise program for self-management of symptoms.    Baseline  initial HEP provided at IE (04/18/2019); continues to participate in currently appropriate HEP (05/18/2019; 06/29/2019);    Time  12    Period  Weeks    Status  Partially Met    Target Date  09/21/19      PT LONG TERM GOAL #2   Title  Demonstrate improved FOTO score by 10 units to demonstrate improvement in overall condition and self-reported functional ability.    Baseline  44 (04/18/2019); 52 (05/18/2019); 52 (06/29/2019);    Time  12    Period  Weeks    Status  Partially Met    Target Date  09/21/19      PT LONG TERM GOAL  #3   Title  Complete community, work and/or recreational activities without limitation due to current condition.    Baseline  Difficulites with walking, standing, shopping, driving, cooking and bathing, currently NWB L LE (04/18/2019); continues with similar difficulties but is now PWB25% and is able to drive, cross legs to don shoes (05/18/2019); improving but continues to have limitations and cannot perform activities that require single leg stance full weight bearing (06/29/2019);    Time  12    Period  Weeks    Status  Partially Met    Target Date  09/21/19      PT LONG TERM  GOAL #4   Title  Increase L knee flexion AROM to 140 degrees to be able to complete ADLs without limitation of knee flexion    Baseline  PROM 95 degrees of flexion with pain at end range and AROM 80 degrees(04/18/2019); AAROM 110 with end range pain (05/18/2019); PROM 0-126, AROM ext -25, AAROM flex 115 (06/29/2019);    Time  12    Period  Weeks    Status  Partially Met    Target Date  09/21/19      PT LONG TERM GOAL #5   Title  Pt will increase LE strength to equal or greater than 4+/5 MMT in order to demonstrate improvement in strength and function.    Baseline  See objectives on IE(04/18/2019); still 2/10 L knee extension (lacking last 22 degrees against gravity) (05/18/2019); continues to lack terminal knee extension in open change position, improved to at least 4/5 with discomfort within available range (06/29/2019);    Time  12    Period  Weeks    Status  Partially Met    Target Date  09/21/19            Plan - 07/03/19 1302    Clinical Impression Statement  Patient tolerated treatment well and continues to make progress towards goals. He was able to progress to single leg bridge with improved glute activation and increased resistance on leg press machine. Continues to use RW for ambulation and has not yet returned to PLOF. Patient would benefit from continued management of limiting condition by skilled  physical therapist to address remaining impairments and functional limitations to work towards stated goals and return to PLOF or maximal functional independence.    Personal Factors and Comorbidities  Comorbidity 2;Age    Comorbidities  L ankle surgery; hypertension; history of falling; L knee pain.    Examination-Activity Limitations  Bathing;Bed Mobility;Locomotion Level;Stairs;Stand;Hygiene/Grooming;Bend;Caring for Others;Sleep;Lift;Transfers;Toileting;Squat;Carry    Examination-Participation Restrictions  Yard Work;Driving;Meal Prep;Laundry;Shop;Cleaning    Stability/Clinical Decision Making  Evolving/Moderate complexity    Rehab Potential  Good    PT Frequency  2x / week    PT Duration  12 weeks    PT Treatment/Interventions  ADLs/Self Care Home Management;Cryotherapy;Moist Heat;Gait training;Stair training;Functional mobility training;Therapeutic activities;Therapeutic exercise;Balance training;Neuromuscular re-education;Manual techniques;Joint Manipulations;Passive range of motion;Dry needling;DME Instruction    PT Next Visit Plan  Strengthening, ROM and muscle endurance, gait training    PT Home Exercise Plan  Medbridge: [email or web address]    Consulted and Agree with Plan of Care  Patient       Patient will benefit from skilled therapeutic intervention in order to improve the following deficits and impairments:  Abnormal gait, Decreased endurance, Decreased activity tolerance, Decreased range of motion, Decreased strength, Pain, Impaired flexibility, Difficulty walking, Decreased balance, Impaired perceived functional ability, Decreased knowledge of use of DME, Decreased mobility  Visit Diagnosis: Acute pain of left knee  Unsteadiness on feet  Muscle weakness (generalized)     Problem List There are no problems to display for this patient.   Everlean Alstrom. Graylon Good, PT, DPT 07/03/19, 1:02 PM  [facility] [address], [place], [address] Phone: [phone]   Fax:  [phone]  Name: Glenn Watson MRN: [id number] Date of Birth: [birth year]

## 2019-07-05 ENCOUNTER — Other Ambulatory Visit: Payer: Self-pay

## 2019-07-05 ENCOUNTER — Encounter: Payer: Self-pay | Admitting: Physical Therapy

## 2019-07-05 ENCOUNTER — Ambulatory Visit: Payer: PPO | Admitting: Physical Therapy

## 2019-07-05 ENCOUNTER — Telehealth: Payer: Self-pay | Admitting: Physical Therapy

## 2019-07-05 DIAGNOSIS — M6281 Muscle weakness (generalized): Secondary | ICD-10-CM

## 2019-07-05 DIAGNOSIS — M25562 Pain in left knee: Secondary | ICD-10-CM

## 2019-07-05 DIAGNOSIS — R2681 Unsteadiness on feet: Secondary | ICD-10-CM

## 2019-07-05 NOTE — Therapy (Addendum)
[facility] [address], [place], [address] Phone: [phone]   Fax:  [phone]  Physical Therapy Treatment  Patient Details  Name: Glenn Watson MRN: [id number] Date of Birth: [birth year] Referring Provider (PT): Altamese [place], MD   Encounter Date: 07/05/2019  PT End of Session - 07/05/19 1011    Visit Number  22    Number of Visits  44    Date for PT Re-Evaluation  09/21/19    Authorization Type  [employer] (new reporting period from 06/29/2019)    Authorization - Visit Number  2    Authorization - Number of Visits  10    PT Start Time  0900    PT Stop Time  0945    PT Time Calculation (min)  45 min    Activity Tolerance  Patient tolerated treatment well    Behavior During Therapy  [facility] for tasks assessed/performed       Past Medical History:  Diagnosis Date  . Hypertension     Past Surgical History:  Procedure Laterality Date  . APPENDECTOMY  2011   Dr. Bary Watson  . COLONOSCOPY WITH PROPOFOL N/A 02/10/2017   Procedure: COLONOSCOPY WITH PROPOFOL;  Surgeon: Christene Lye, MD;  Location: [facility] ENDOSCOPY;  Service: Endoscopy;  Laterality: N/A;    There were no vitals filed for this visit.  Subjective Assessment - 07/05/19 1005    Subjective  Patient reports he is feeling well today and his back is not hurting. States he has mostly been using the walker and had a bit of back pain when using the cane a couple of times yesterday. States he had no excessive pain or soreness following last treatment session. States he saw Dr. Earnestine Leys yesterday who was concerned about medial-lateral laxity in the tibia after testing it and ordered a CT scan for the patient (which he has not yet received a call to schedule) and referred him back to Dr. Marcelino Scot (patient has not yet made appointment). Documentation notes "probably getting a fibrous union" and it was noted that  "X-ray of the left knee  today shows the fracture with very slight with more impaction medially. It is difficult to tell if there is a solid healing here or not." Patient is dissapointed that he may require more intervention/rehab than he origionally thought.    Pertinent History  L ankle surgery; hypertension; history of falling; L knee pain.    Limitations  Lifting;Standing;Walking;House hold activities    How long can you stand comfortably?  can do dishes    How long can you walk comfortably?  walks around outside some (patio and walkway to the patio - can get there without any problem): thinks he could make it about 15 min    Diagnostic tests  CT: Comminuted and impacted intra-articular fracture of the proximaltibia as described. This fracture involves both tibial plateaus andis associated with depression of the articular surface posteriorly. Mildly comminuted intra-articular fracture of the fibular headand neck. 04/07/2019    Patient Stated Goals  Able to walk without AD    Currently in Pain?  No/denies    Pain Onset  1 to 4 weeks ago      TREATMENT: denies sensitivity to latex  Therapeutic exercise:to centralize symptoms and improve ROM, strength, muscular endurance, and activity tolerance required for successful completion of functional activities.  -Recumbent Bikeseat setting 14. For improved lower extremity ROM, muscular endurance, and activity tolerance; and to induce  the analgesic effect of aerobic exercise, stimulate joint nutrition, and prepare body structures and systems for following interventions, freely rotatingx2mnutes during subjective exam. moved seat up to level 10 with back and forth rotations with end range stretch on 4 min (able to go around one revolution at end).   (manual therapy - see below)  - hooklying bridge to isometric hip abduction against manual resistance 2-5 second holds x20.  - L single leg bridge with clinician stabilizing L foot and providing tactile facilitation at L  glute and mild resistance at lateral knee to improve glute contraction. 3x10. Mild increase in R low back pain that subsided with rest.  - Total gymLsingle leg squat ,x7 (level 11 - top of bar), x12 (level 9), 2x 12 (level 10). Decreased resistance due to increased ROM and difficulty controlling movement in these larger ranges. SBA for safety to get in and out of machine.  Manual therapy:to reduce pain and tissue tension, improve range of motion, neuromodulation, in order to promote improved ability to complete functional activities.. - Supine L hamstring stretch 4x30-45 seconds between sets of single leg bridge. - PROML kneeflexion and extension with OP to tolerance, 5 second hold, x20 each way. (130degrees PROM flexion at end). - tested and noted medial-lateral laxity of the L tibia.   HOME EXERCISE PROGRAM Access Code: [email or web address] URL: [email or web address] Date: 06/29/2019  Prepared by: SRosita Kea  Exercises Standing Terminal Knee Extension with Resistance - 30 reps - 5 second hold - 1-2x daily Bridge with Hip Abduction and Resistance - 3 sets - 20 reps - 2x daily - 7x weekly Sidelying Thoracic Rotation with Open Book - 20 reps Supine Lower Trunk Rotation - 20 reps Prone Press Up - 20 reps - 1 second hold  PT Education - 07/05/19 1011    Education Details  exercise purpose/form. self management techniques    Person(s) Educated  Patient    Methods  Explanation;Demonstration;Tactile cues;Verbal cues    Comprehension  Verbalized understanding;Returned demonstration;Verbal cues required;Tactile cues required;Need further instruction       PT Short Term Goals - 04/24/19 1426      PT SHORT TERM GOAL #1   Title  Be independent with initial home exercise program for self-management of symptoms.    Baseline  initial HEP provided at IE (04/18/2019);    Time  2    Period  Weeks    Status  Achieved    Target Date  05/02/19        PT Long Term Goals  - 06/29/19 1222      PT LONG TERM GOAL #1   Title  Be independent with a long-term home exercise program for self-management of symptoms.    Baseline  initial HEP provided at IE (04/18/2019); continues to participate in currently appropriate HEP (05/18/2019; 06/29/2019);    Time  12    Period  Weeks    Status  Partially Met    Target Date  09/21/19      PT LONG TERM GOAL #2   Title  Demonstrate improved FOTO score by 10 units to demonstrate improvement in overall condition and self-reported functional ability.    Baseline  44 (04/18/2019); 52 (05/18/2019); 52 (06/29/2019);    Time  12    Period  Weeks    Status  Partially Met    Target Date  09/21/19      PT LONG TERM GOAL #3   Title  Complete community, work and/or recreational activities without limitation due  to current condition.    Baseline  Difficulites with walking, standing, shopping, driving, cooking and bathing, currently NWB L LE (04/18/2019); continues with similar difficulties but is now PWB25% and is able to drive, cross legs to don shoes (05/18/2019); improving but continues to have limitations and cannot perform activities that require single leg stance full weight bearing (06/29/2019);    Time  12    Period  Weeks    Status  Partially Met    Target Date  09/21/19      PT LONG TERM GOAL #4   Title  Increase L knee flexion AROM to 140 degrees to be able to complete ADLs without limitation of knee flexion    Baseline  PROM 95 degrees of flexion with pain at end range and AROM 80 degrees(04/18/2019); AAROM 110 with end range pain (05/18/2019); PROM 0-126, AROM ext -25, AAROM flex 115 (06/29/2019);    Time  12    Period  Weeks    Status  Partially Met    Target Date  09/21/19      PT LONG TERM GOAL #5   Title  Pt will increase LE strength to equal or greater than 4+/5 MMT in order to demonstrate improvement in strength and function.    Baseline  See objectives on IE(04/18/2019); still 2/10 L knee extension (lacking  last 22 degrees against gravity) (05/18/2019); continues to lack terminal knee extension in open change position, improved to at least 4/5 with discomfort within available range (06/29/2019);    Time  12    Period  Weeks    Status  Partially Met    Target Date  09/21/19          Plan - 07/05/19 1005    Clinical Impression Statement  Patient tolerated treatment well with progress demonstrated towards improved ROM and able to complete more single leg strengthening in squat pattern as well as hip extension and abduction. Did not work on improving single leg weight bearing and weight shifting directly due to physician's note that he is concerned patient may be developing fibrous union. Left message with Dr. Delman Cheadle office to clarify any updates in weight bearing status. Patient would benefit from continued management of limiting condition by skilled physical therapist to address remaining impairments and functional limitations to work towards stated goals and return to PLOF or maximal functional independence.    Personal Factors and Comorbidities  Comorbidity 2;Age    Comorbidities  L ankle surgery; hypertension; history of falling; L knee pain.    Examination-Activity Limitations  Bathing;Bed Mobility;Locomotion Level;Stairs;Stand;Hygiene/Grooming;Bend;Caring for Others;Sleep;Lift;Transfers;Toileting;Squat;Carry    Examination-Participation Restrictions  Yard Work;Driving;Meal Prep;Laundry;Shop;Cleaning    Stability/Clinical Decision Making  Evolving/Moderate complexity    Rehab Potential  Good    PT Frequency  2x / week    PT Duration  12 weeks    PT Treatment/Interventions  ADLs/Self Care Home Management;Cryotherapy;Moist Heat;Gait training;Stair training;Functional mobility training;Therapeutic activities;Therapeutic exercise;Balance training;Neuromuscular re-education;Manual techniques;Joint Manipulations;Passive range of motion;Dry needling;DME Instruction    PT Next Visit Plan   Strengthening, ROM and muscle endurance, gait training    PT Home Exercise Plan  Medbridge: [email or web address]    Consulted and Agree with Plan of Care  Patient       Patient will benefit from skilled therapeutic intervention in order to improve the following deficits and impairments:  Abnormal gait, Decreased endurance, Decreased activity tolerance, Decreased range of motion, Decreased strength, Pain, Impaired flexibility, Difficulty walking, Decreased balance, Impaired perceived functional ability, Decreased knowledge of use  of DME, Decreased mobility  Visit Diagnosis: Acute pain of left knee  Unsteadiness on feet  Muscle weakness (generalized)     Problem List There are no problems to display for this patient.   Everlean Alstrom. Graylon Good, PT, DPT 07/05/19, 10:12 AM   UPDATE for ADDENDUM: Dr. Ammie Ferrier office returned call and stated that patient should minimize weight bearing on L LE until CT scan can be completed. Conveyed this information to patient.  Everlean Alstrom. Graylon Good, PT, DPT 07/05/19, 2:59 PM  [facility] [address], [place], [address] Phone: [phone]   Fax:  [phone]  Name: JARL SELLITTO MRN: [id number] Date of Birth: Nov 12, 1945

## 2019-07-05 NOTE — Telephone Encounter (Addendum)
Called Dr. Delman Cheadle office to confirm weight bearing precautions after patient saw physician yesterday who documented concern about possible fibrous union. He referred pt to get a CT scan and back to Dr. Marcelino Scot due to concerns about this. Left message to be relayed to Dr. Sabra Heck by speaking to [place] from Triage. Let her know patient is currently WBAT and I have been working with him to improve single leg weight shift for gait with the goal of moving away from AD and am thinking maybe I should hold off on this due to concerns about healing. Requested call back to advise. Call back number: [place]. Graylon Good, PT, DPT 07/05/19, 10:04 AM   Tiera from Dr. Ammie Ferrier office called back and said he wanted the patient to do minimal weight bearing until a CT scan could be obtained. Also said there was some uncertainty about who would be ordering the CT scan since patient is transferring back to Dr. Marcelino Scot now. Agreed that I should call and update the patient of this. States she was working on getting things clarified with the CT scan. I let her know patient has been waiting to get a call to get the CT scan scheduled.   Everlean Alstrom. Graylon Good, PT, DPT 07/05/19, 2:48 PM   Called patient and updated him on what Dr. Ammie Ferrier office had said, to do minimal weight bearing on the L leg. Patient said he understood and agreed. States he had sent a portal message to Dr. Sabra Heck today about the CT scan.   Everlean Alstrom. Graylon Good, PT, DPT 07/05/19, 2:52 PM

## 2019-07-07 ENCOUNTER — Other Ambulatory Visit: Payer: Self-pay | Admitting: Specialist

## 2019-07-07 DIAGNOSIS — S82222A Displaced transverse fracture of shaft of left tibia, initial encounter for closed fracture: Secondary | ICD-10-CM

## 2019-07-10 ENCOUNTER — Encounter: Payer: Self-pay | Admitting: Physical Therapy

## 2019-07-10 ENCOUNTER — Other Ambulatory Visit: Payer: Self-pay

## 2019-07-10 ENCOUNTER — Ambulatory Visit: Payer: PPO | Admitting: Physical Therapy

## 2019-07-10 DIAGNOSIS — M25562 Pain in left knee: Secondary | ICD-10-CM

## 2019-07-10 DIAGNOSIS — R2681 Unsteadiness on feet: Secondary | ICD-10-CM

## 2019-07-10 DIAGNOSIS — M6281 Muscle weakness (generalized): Secondary | ICD-10-CM

## 2019-07-10 NOTE — Therapy (Signed)
[place] [facility] [address], [place], [address] Phone: [phone]   Fax:  [phone]  Physical Therapy Treatment  Patient Details  Name: Glenn Watson MRN: [id number] Date of Birth: 20-Apr-1946 Referring Provider (PT): Altamese Magnolia, MD   Encounter Date: 07/10/2019  PT End of Session - 07/10/19 1612    Visit Number  23    Number of Visits  44    Date for PT Re-Evaluation  09/21/19    Authorization Type  [employer] (new reporting period from 06/29/2019)    Authorization - Visit Number  3    Authorization - Number of Visits  10    PT Start Time  1603    PT Stop Time  1645    PT Time Calculation (min)  42 min    Activity Tolerance  Patient tolerated treatment well    Behavior During Therapy  [facility] for tasks assessed/performed       Past Medical History:  Diagnosis Date  . Hypertension     Past Surgical History:  Procedure Laterality Date  . APPENDECTOMY  2011   Dr. Bary Castilla  . COLONOSCOPY WITH PROPOFOL N/A 02/10/2017   Procedure: COLONOSCOPY WITH PROPOFOL;  Surgeon: Christene Lye, MD;  Location: [facility] ENDOSCOPY;  Service: Endoscopy;  Laterality: N/A;    There were no vitals filed for this visit.  Subjective Assessment - 07/10/19 1608    Subjective  Patient reports he has been using his walker and has returned to his long hinged brace because it feels better to him. This is since physical therapist called to update him that Dr. Earnestine Leys wanted him to complete minimal weight bearing until the CT scan can be performed. Patient has a CT scan scheduled for this Thursday and a tentative appointment with Dr. Marcelino Scot scheduled for the following Wednesday. Continues to report no pain in the leg. States his back pain is off and on and prevents him from laying on the left side for any length of time. Patient reports he has been to the gym 2 times since last session and did not do the leg machines  except the recumbent bike.    Pertinent History  L ankle surgery; hypertension; history of falling; L knee pain.    Limitations  Lifting;Standing;Walking;House hold activities    How long can you stand comfortably?  can do dishes    How long can you walk comfortably?  walks around outside some (patio and walkway to the patio - can get there without any problem): thinks he could make it about 15 min    Diagnostic tests  CT: Comminuted and impacted intra-articular fracture of the proximaltibia as described. This fracture involves both tibial plateaus andis associated with depression of the articular surface posteriorly. Mildly comminuted intra-articular fracture of the fibular headand neck. 04/07/2019    Patient Stated Goals  Able to walk without AD    Currently in Pain?  No/denies    Pain Onset  1 to 4 weeks ago       TREATMENT: denies sensitivity to latex  Therapeutic exercise:to centralize symptoms and improve ROM, strength, muscular endurance, and activity tolerance required for successful completion of functional activities.  -Recumbent Bikeseat setting 12. For improved lower extremity ROM, muscular endurance, and activity tolerance; and to induce the analgesic effect of aerobic exercise, stimulate joint nutrition, and prepare body structures and systems for following interventions, freely rotatingx54mnutesduring subjective exam. moved seat up to level 10 with back  and forth rotations with end range stretch on 4 min (able to go around one revolution at end).   - L single leg bridge with distal femur on bolster (airex w+cylindrical red bolster + pillow stacked on it), 3x10 each side. Clinician holding bolster in place and ensuring no weight bearing through lower leg.  sidelying L hip abduction: - with green band looped around distal femurs x 10 (difficulty getting into correct position) - with no band 3x10 with manual assistance to prevent hip flexion.  - prone hip extension with  knee flexed to 90 to bias glute max, x10 on R. 2x10 on left (hip moving into ER each time).   Manual therapy:to reduce pain and tissue tension, improve range of motion, neuromodulation, in order to promote improved ability to complete functional activities.. - Supine L hamstring stretch 4x30-45 seconds between sets of single leg bridge. - prone L hip flexor stretch using mob belt and contract-relax technique to improve ability to perform hip extension.    Brace removed during session, re-applied before leaving.   HOME EXERCISE PROGRAM Access Code: [email or web address] URL: [email or web address] Date: 06/29/2019  Prepared by: Rosita Kea   Exercises Standing Terminal Knee Extension with Resistance - 30 reps - 5 second hold - 1-2x daily Bridge with Hip Abduction and Resistance - 3 sets - 20 reps - 2x daily - 7x weekly Sidelying Thoracic Rotation with Open Book - 20 reps Supine Lower Trunk Rotation - 20 reps Prone Press Up - 20 reps - 1 second hold   PT Education - 07/10/19 1611    Education Details  exercise purpose/form. self management techniques    Person(s) Educated  Patient    Methods  Explanation;Demonstration;Tactile cues;Verbal cues    Comprehension  Verbalized understanding;Returned demonstration;Need further instruction;Tactile cues required;Verbal cues required       PT Short Term Goals - 04/24/19 1426      PT SHORT TERM GOAL #1   Title  Be independent with initial home exercise program for self-management of symptoms.    Baseline  initial HEP provided at IE (04/18/2019);    Time  2    Period  Weeks    Status  Achieved    Target Date  05/02/19        PT Long Term Goals - 06/29/19 1222      PT LONG TERM GOAL #1   Title  Be independent with a long-term home exercise program for self-management of symptoms.    Baseline  initial HEP provided at IE (04/18/2019); continues to participate in currently appropriate HEP (05/18/2019; 06/29/2019);    Time   12    Period  Weeks    Status  Partially Met    Target Date  09/21/19      PT LONG TERM GOAL #2   Title  Demonstrate improved FOTO score by 10 units to demonstrate improvement in overall condition and self-reported functional ability.    Baseline  44 (04/18/2019); 52 (05/18/2019); 52 (06/29/2019);    Time  12    Period  Weeks    Status  Partially Met    Target Date  09/21/19      PT LONG TERM GOAL #3   Title  Complete community, work and/or recreational activities without limitation due to current condition.    Baseline  Difficulites with walking, standing, shopping, driving, cooking and bathing, currently NWB L LE (04/18/2019); continues with similar difficulties but is now PWB25% and is able to drive, cross legs to don shoes (05/18/2019);  improving but continues to have limitations and cannot perform activities that require single leg stance full weight bearing (06/29/2019);    Time  12    Period  Weeks    Status  Partially Met    Target Date  09/21/19      PT LONG TERM GOAL #4   Title  Increase L knee flexion AROM to 140 degrees to be able to complete ADLs without limitation of knee flexion    Baseline  PROM 95 degrees of flexion with pain at end range and AROM 80 degrees(04/18/2019); AAROM 110 with end range pain (05/18/2019); PROM 0-126, AROM ext -25, AAROM flex 115 (06/29/2019);    Time  12    Period  Weeks    Status  Partially Met    Target Date  09/21/19      PT LONG TERM GOAL #5   Title  Pt will increase LE strength to equal or greater than 4+/5 MMT in order to demonstrate improvement in strength and function.    Baseline  See objectives on IE(04/18/2019); still 2/10 L knee extension (lacking last 22 degrees against gravity) (05/18/2019); continues to lack terminal knee extension in open change position, improved to at least 4/5 with discomfort within available range (06/29/2019);    Time  12    Period  Weeks    Status  Partially Met    Target Date  09/21/19             Plan - 07/10/19 1940    Clinical Impression Statement  Patient tolerated treatment well overall and found hip exercises challenging. He continues to have difficulty with hip extension and abduction together and required heavy cuing and physical assistance to prevent compensations during hip abduction exercise. Weight bearing was minimized thoughout session as well as torque at the lower leg. Patient continues to be very limited in mobility and function due to impairments such as weakness, decreased coordination, imbalance, post-surgical precautions. Patient would benefit from continued management of limiting condition by skilled physical therapist to address remaining impairments and functional limitations to work towards stated goals and return to PLOF or maximal functional independence.    Personal Factors and Comorbidities  Comorbidity 2;Age    Comorbidities  L ankle surgery; hypertension; history of falling; L knee pain.    Examination-Activity Limitations  Bathing;Bed Mobility;Locomotion Level;Stairs;Stand;Hygiene/Grooming;Bend;Caring for Others;Sleep;Lift;Transfers;Toileting;Squat;Carry    Examination-Participation Restrictions  Yard Work;Driving;Meal Prep;Laundry;Shop;Cleaning    Stability/Clinical Decision Making  Evolving/Moderate complexity    Rehab Potential  Good    PT Frequency  2x / week    PT Duration  12 weeks    PT Treatment/Interventions  ADLs/Self Care Home Management;Cryotherapy;Moist Heat;Gait training;Stair training;Functional mobility training;Therapeutic activities;Therapeutic exercise;Balance training;Neuromuscular re-education;Manual techniques;Joint Manipulations;Passive range of motion;Dry needling;DME Instruction    PT Next Visit Plan  Strengthening, ROM and muscle endurance, gait training when appropriate    PT Home Exercise Plan  Medbridge: [email or web address]    Consulted and Agree with Plan of Care  Patient       Patient will benefit from skilled therapeutic  intervention in order to improve the following deficits and impairments:  Abnormal gait, Decreased endurance, Decreased activity tolerance, Decreased range of motion, Decreased strength, Pain, Impaired flexibility, Difficulty walking, Decreased balance, Impaired perceived functional ability, Decreased knowledge of use of DME, Decreased mobility  Visit Diagnosis: Acute pain of left knee  Unsteadiness on feet  Muscle weakness (generalized)     Problem List There are no problems to display for this patient.  Everlean Alstrom. Graylon Good, PT, DPT 07/10/19, 7:41 PM  [place] [facility] [address] [place], [address] Phone: [phone]   Fax:  [phone]  Name: Glenn Watson MRN: [id number] Date of Birth: 1945/07/10

## 2019-07-12 ENCOUNTER — Encounter: Payer: Self-pay | Admitting: Physical Therapy

## 2019-07-12 ENCOUNTER — Ambulatory Visit: Payer: PPO | Admitting: Physical Therapy

## 2019-07-12 DIAGNOSIS — R2681 Unsteadiness on feet: Secondary | ICD-10-CM

## 2019-07-12 DIAGNOSIS — M6281 Muscle weakness (generalized): Secondary | ICD-10-CM

## 2019-07-12 DIAGNOSIS — M25562 Pain in left knee: Secondary | ICD-10-CM

## 2019-07-12 NOTE — Therapy (Signed)
[place] [facility] [address] [place], [address] Phone: [phone]   Fax:  [phone]  Physical Therapy Treatment  Patient Details  Name: Glenn Watson MRN: [id number] Date of Birth: 1945-07-04 Referring Provider (PT): Altamese Orchard, MD   Encounter Date: 07/12/2019  PT End of Session - 07/12/19 1536    Visit Number  24    Number of Visits  80    Date for PT Re-Evaluation  09/21/19    Authorization Type  [employer] (new reporting period from 06/29/2019)    Authorization - Visit Number  4    Authorization - Number of Visits  10    PT Start Time  1430    PT Stop Time  1520    PT Time Calculation (min)  50 min    Activity Tolerance  Patient tolerated treatment well;Patient limited by pain    Behavior During Therapy  [facility] for tasks assessed/performed       Past Medical History:  Diagnosis Date  . Hypertension     Past Surgical History:  Procedure Laterality Date  . APPENDECTOMY  2011   Dr. Bary Castilla  . COLONOSCOPY WITH PROPOFOL N/A 02/10/2017   Procedure: COLONOSCOPY WITH PROPOFOL;  Surgeon: Christene Lye, MD;  Location: [facility] ENDOSCOPY;  Service: Endoscopy;  Laterality: N/A;    There were no vitals filed for this visit.  Subjective Assessment - 07/12/19 1434    Subjective  Patient reports no pain upon arrival. States he was a bit sore in the hips (pointed to glutes) following last treatment session. States he gets a little bit of discomfort at the laterl lower leg on the left at times but is not concerned about this. Is continuing to use his walker to get around. CT scan is tomorrow and he sees Dr. Marcelino Scot next Wednesday.    Pertinent History  L ankle surgery; hypertension; history of falling; L knee pain.    Limitations  Lifting;Standing;Walking;House hold activities    How long can you stand comfortably?  can do dishes    How long can you walk comfortably?  walks around outside some  (patio and walkway to the patio - can get there without any problem): thinks he could make it about 15 min    Diagnostic tests  CT: Comminuted and impacted intra-articular fracture of the proximaltibia as described. This fracture involves both tibial plateaus andis associated with depression of the articular surface posteriorly. Mildly comminuted intra-articular fracture of the fibular headand neck. 04/07/2019    Patient Stated Goals  Able to walk without AD    Currently in Pain?  No/denies    Pain Onset  1 to 4 weeks ago         TREATMENT: denies sensitivity to latex  Therapeutic exercise:to centralize symptoms and improve ROM, strength, muscular endurance, and activity tolerance required for successful completion of functional activities.  -Recumbent Bikeseat setting 14. For improved lower extremity ROM, muscular endurance, and activity tolerance; and to induce the analgesic effect of aerobic exercise, stimulate joint nutrition, and prepare body structures and systems for following interventions, freely rotatingx78mnutesduring subjective exam. moved seat up to level10with back and forth rotations with end range stretch on 346m (able to go around one revolution at end).  - single leg bridge with distal femur on bolster (airex w+cylindrical red bolster + pillow stacked on it), x10, 2x15 each side (R side completed without bolster). Clinician holding bolster in place and ensuring no weight bearing  through L lower leg.  - Supine eccentric L ASLR x 3 following each hamstring stretch.   Sidelying with quarter turn towards prone, L hip up:  - L hip abduction with extension bias. x10 no band, 2x10 with red band looped around distal thighs. Intensive cuing for form. Better than previously.   Sidelying with L hip up:  - L LE circles from hip clockwise, x 15 no resistance, 2x15 with 5# ankle weight on distal thigh. Reported discomfort in medial knee so attempted to support lower leg  manually, then with green theraband holding from above. Patient found very challenging. Improved form with self-palpation at L pelvis with improved isolation of hip extension.   - Sidelying double leg lifts 2x10 each side for lateral trunk, hip, and frontal plane leg strengthening.  Manual therapy:to reduce pain and tissue tension, improve range of motion, neuromodulation, in order to promote improved ability to complete functional activities.. -Supine L hamstring stretch 3x30-45 seconds.  - prone L hip flexor stretch using mob belt and contract-relax technique to improve ability to perform hip extension. Two sets of about 60 seconds each set .   Brace removed during session, re-applied before leaving.   HOME EXERCISE PROGRAM Access Code: [email or web address] URL: [email or web address] Date: 06/29/2019  Prepared by: Rosita Kea   Exercises Standing Terminal Knee Extension with Resistance - 30 reps - 5 second hold - 1-2x daily Bridge with Hip Abduction and Resistance - 3 sets - 20 reps - 2x daily - 7x weekly Sidelying Thoracic Rotation with Open Book - 20 reps Supine Lower Trunk Rotation - 20 reps Prone Press Up - 20 reps - 1 second hold   PT Education - 07/12/19 1435    Education Details  exercise purpose/form. self management techniques    Person(s) Educated  Patient    Methods  Explanation;Demonstration;Tactile cues;Verbal cues    Comprehension  Verbalized understanding;Returned demonstration;Verbal cues required;Need further instruction;Tactile cues required       PT Short Term Goals - 04/24/19 1426      PT SHORT TERM GOAL #1   Title  Be independent with initial home exercise program for self-management of symptoms.    Baseline  initial HEP provided at IE (04/18/2019);    Time  2    Period  Weeks    Status  Achieved    Target Date  05/02/19        PT Long Term Goals - 06/29/19 1222      PT LONG TERM GOAL #1   Title  Be independent with a long-term  home exercise program for self-management of symptoms.    Baseline  initial HEP provided at IE (04/18/2019); continues to participate in currently appropriate HEP (05/18/2019; 06/29/2019);    Time  12    Period  Weeks    Status  Partially Met    Target Date  09/21/19      PT LONG TERM GOAL #2   Title  Demonstrate improved FOTO score by 10 units to demonstrate improvement in overall condition and self-reported functional ability.    Baseline  44 (04/18/2019); 52 (05/18/2019); 52 (06/29/2019);    Time  12    Period  Weeks    Status  Partially Met    Target Date  09/21/19      PT LONG TERM GOAL #3   Title  Complete community, work and/or recreational activities without limitation due to current condition.    Baseline  Difficulites with walking, standing, shopping, driving, cooking and bathing, currently  NWB L LE (04/18/2019); continues with similar difficulties but is now PWB25% and is able to drive, cross legs to don shoes (05/18/2019); improving but continues to have limitations and cannot perform activities that require single leg stance full weight bearing (06/29/2019);    Time  12    Period  Weeks    Status  Partially Met    Target Date  09/21/19      PT LONG TERM GOAL #4   Title  Increase L knee flexion AROM to 140 degrees to be able to complete ADLs without limitation of knee flexion    Baseline  PROM 95 degrees of flexion with pain at end range and AROM 80 degrees(04/18/2019); AAROM 110 with end range pain (05/18/2019); PROM 0-126, AROM ext -25, AAROM flex 115 (06/29/2019);    Time  12    Period  Weeks    Status  Partially Met    Target Date  09/21/19      PT LONG TERM GOAL #5   Title  Pt will increase LE strength to equal or greater than 4+/5 MMT in order to demonstrate improvement in strength and function.    Baseline  See objectives on IE(04/18/2019); still 2/10 L knee extension (lacking last 22 degrees against gravity) (05/18/2019); continues to lack terminal knee extension  in open change position, improved to at least 4/5 with discomfort within available range (06/29/2019);    Time  12    Period  Weeks    Status  Partially Met    Target Date  09/21/19            Plan - 07/12/19 1551    Clinical Impression Statement  Patient tolerated treatment well overall with some limitations due to pain reported at medial left proximal tibia with sidelying abduction exercises that resolved with rest. Session focused on hip and core strengthening as well as knee and hip ROM to prepare for transition to gait training when weight bearing precautions are lifted. Patient continues to require significant cuing for form. Patient has not yet returned to [facility] and continues to be significantly limited in mobility and functional activities. Patient would benefit from continued management of limiting condition by skilled physical therapist to address remaining impairments and functional limitations to work towards stated goals and return to PLOF or maximal functional independence.    Personal Factors and Comorbidities  Comorbidity 2;Age    Comorbidities  L ankle surgery; hypertension; history of falling; L knee pain.    Examination-Activity Limitations  Bathing;Bed Mobility;Locomotion Level;Stairs;Stand;Hygiene/Grooming;Bend;Caring for Others;Sleep;Lift;Transfers;Toileting;Squat;Carry    Examination-Participation Restrictions  Yard Work;Driving;Meal Prep;Laundry;Shop;Cleaning    Stability/Clinical Decision Making  Evolving/Moderate complexity    Rehab Potential  Good    PT Frequency  2x / week    PT Duration  12 weeks    PT Treatment/Interventions  ADLs/Self Care Home Management;Cryotherapy;Moist Heat;Gait training;Stair training;Functional mobility training;Therapeutic activities;Therapeutic exercise;Balance training;Neuromuscular re-education;Manual techniques;Joint Manipulations;Passive range of motion;Dry needling;DME Instruction    PT Next Visit Plan  Strengthening, ROM and muscle  endurance, gait training when appropriate    PT Home Exercise Plan  Medbridge: [email or web address]    Consulted and Agree with Plan of Care  Patient       Patient will benefit from skilled therapeutic intervention in order to improve the following deficits and impairments:  Abnormal gait, Decreased endurance, Decreased activity tolerance, Decreased range of motion, Decreased strength, Pain, Impaired flexibility, Difficulty walking, Decreased balance, Impaired perceived functional ability, Decreased knowledge of use of DME, Decreased mobility  Visit  Diagnosis: Acute pain of left knee  Unsteadiness on feet  Muscle weakness (generalized)     Problem List There are no problems to display for this patient.   Everlean Alstrom. Graylon Good, PT, DPT 07/12/19, 3:51 PM  [place] [facility] [address] [place], [address] Phone: [phone]   Fax:  [phone]  Name: Glenn Watson MRN: [id number] Date of Birth: 1945-11-18

## 2019-07-13 ENCOUNTER — Other Ambulatory Visit: Payer: Self-pay

## 2019-07-13 ENCOUNTER — Ambulatory Visit
Admission: RE | Admit: 2019-07-13 | Discharge: 2019-07-13 | Disposition: A | Discharge status: Home / Self Care | Payer: PPO | Source: Ambulatory Visit | Attending: Specialist | Admitting: Specialist

## 2019-07-13 DIAGNOSIS — S82222A Displaced transverse fracture of shaft of left tibia, initial encounter for closed fracture: Secondary | ICD-10-CM | POA: Diagnosis not present

## 2019-07-13 DIAGNOSIS — S82101K Unspecified fracture of upper end of right tibia, subsequent encounter for closed fracture with nonunion: Secondary | ICD-10-CM | POA: Diagnosis not present

## 2019-07-17 ENCOUNTER — Ambulatory Visit: Payer: PPO

## 2019-07-18 ENCOUNTER — Other Ambulatory Visit: Payer: Self-pay

## 2019-07-18 ENCOUNTER — Ambulatory Visit: Payer: PPO | Admitting: Physical Therapy

## 2019-07-18 ENCOUNTER — Encounter: Payer: Self-pay | Admitting: Physical Therapy

## 2019-07-18 DIAGNOSIS — M25562 Pain in left knee: Secondary | ICD-10-CM

## 2019-07-18 DIAGNOSIS — R2681 Unsteadiness on feet: Secondary | ICD-10-CM

## 2019-07-18 DIAGNOSIS — M6281 Muscle weakness (generalized): Secondary | ICD-10-CM

## 2019-07-18 NOTE — Therapy (Signed)
[facility] [address], [place], [address] Phone: [phone]   Fax:  [phone]  Physical Therapy Treatment  Patient Details  Name: Glenn Watson MRN: [id number] Date of Birth: 1945-12-31 Referring Provider (PT): Altamese , MD   Encounter Date: 07/18/2019  PT End of Session - 07/18/19 1247    Visit Number  45    Number of Visits  44    Date for PT Re-Evaluation  09/21/19    Authorization Type  [employer] (new reporting period from 06/29/2019)    Authorization - Visit Number  5    Authorization - Number of Visits  10    PT Start Time  1030    PT Stop Time  1120    PT Time Calculation (min)  50 min    Activity Tolerance  Patient tolerated treatment well    Behavior During Therapy  [facility] for tasks assessed/performed       Past Medical History:  Diagnosis Date  . Hypertension     Past Surgical History:  Procedure Laterality Date  . APPENDECTOMY  2011   Dr. Bary Castilla  . COLONOSCOPY WITH PROPOFOL N/A 02/10/2017   Procedure: COLONOSCOPY WITH PROPOFOL;  Surgeon: Christene Lye, MD;  Location: [facility] ENDOSCOPY;  Service: Endoscopy;  Laterality: N/A;    There were no vitals filed for this visit.  Subjective Assessment - 07/18/19 1037    Subjective  Patient reports he is feeling okay today. No pain upon arrival. He states he completed his CT scan and read the report and it looked like his tibia and fibula didn't heal properly and was "mush." He will see Dr. Marcelino Scot tomorrow to discuss it further and reveiw his options. CT scan L knee report 07/18/2019 "IMPRESSION: 1. 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 2. Partial healing of the impacted fracture of the proximal fibula. 3. Moderate knee joint effusion." Was a bit sore in the hips following last treatment session.    Pertinent History  L ankle surgery; hypertension; history of falling; L knee pain.    Limitations  Lifting;Standing;Walking;House hold activities    How long can you stand comfortably?  can do dishes    How long can you walk comfortably?  walks around outside some (patio and walkway to the patio - can get there without any problem): thinks he could make it about 15 min    Diagnostic tests  CT scan L knee report 07/18/2019 "IMPRESSION: 1. 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 2. Partial healing of the impacted fracture of the proximal fibula. 3. Moderate knee joint effusion."    Patient Stated Goals  Able to walk without AD    Currently in Pain?  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  Short brace applied entire session.   -Recumbent Bikeseat setting 14. For improved lower extremity ROM, muscular endurance, and activity tolerance; and to induce the analgesic effect of aerobic exercise, stimulate joint nutrition, and prepare body structures and systems for following interventions, freely rotatingx36mnutesduring subjective exam. moved seat up to level10with back and forth rotations with end range stretch on 377m. Very gentle.   Sidelying with L hip up:  -  L LE circles from hip clockwise, x 20 no resistance. Strong cuing for improved hip control.  - L hip flexion and extension in abduction (like pilates exercise). With core activated. 2x20 with strong cuing and education about improving forme.   Prone with pillow under hips:  - L hip extension with knee extended x20 - L hip extension with knee flexed 2x10 - L hip extension to abduction to abducted neutral and reverse 2x20 (lifting leg up and over away and towards midline).  - Education on HEP including handout and extensive  review.   Attempted quadruped L hip extension with knee flexed but too difficult to safely get in/out of quadruped position without L knee weight bearing and pt reported pain in calf during hip extension.   Patient fatigued between sets and required extra time for rests between sets.    HOME EXERCISE PROGRAM Access Code: [email or web address]  URL: [email or web address]  Date: 07/18/2019  Prepared by: Rosita Kea   Exercises Sidelying Thoracic Rotation with Open Book - 20 reps Supine Lower Trunk Rotation - 20 reps Prone Press Up - 20 reps - 1 second hold Supine Active Straight Leg Raise - 1-2 sets - 10-20 reps - 1 every other day - 5 seconds to lower Sidelying Hip Flexion and Extension  - 2-3 sets - 15-20 reps - 1 every other day Prone Hip Extension with Bent Knee - One Pillow - 3 sets - 10-20 reps - 4 seconds hold - 1 every other day        PT Education - 07/18/19 1247    Education Details  exercise purpose/form. self management techniques. Updated HEP    Person(s) Educated  Patient    Methods  Explanation;Demonstration;Tactile cues;Verbal cues;Handout    Comprehension  Returned demonstration;Verbal cues required;Need further instruction;Tactile cues required;Verbalized understanding       PT Short Term Goals - 04/24/19 1426      PT SHORT TERM GOAL #1   Title  Be independent with initial home exercise program for self-management of symptoms.    Baseline  initial HEP provided at IE (04/18/2019);    Time  2    Period  Weeks    Status  Achieved    Target Date  05/02/19        PT Long Term Goals - 06/29/19 1222      PT LONG TERM GOAL #1   Title  Be independent with a long-term home exercise program for self-management of symptoms.    Baseline  initial HEP provided at IE (04/18/2019); continues to participate in currently appropriate HEP (05/18/2019; 06/29/2019);    Time  12    Period  Weeks    Status  Partially Met    Target Date  09/21/19      PT LONG TERM  GOAL #2   Title  Demonstrate improved FOTO score by 10 units to demonstrate improvement in overall condition and self-reported functional ability.    Baseline  44 (04/18/2019); 52 (05/18/2019); 52 (06/29/2019);    Time  12    Period  Weeks    Status  Partially Met    Target Date  09/21/19      PT LONG TERM GOAL #3   Title  Complete community, work and/or recreational activities without limitation due to current condition.    Baseline  Difficulites with walking, standing, shopping, driving, cooking and bathing, currently NWB L LE (04/18/2019); continues with similar difficulties but is now PWB25% and is able to drive, cross legs to don  shoes (05/18/2019); improving but continues to have limitations and cannot perform activities that require single leg stance full weight bearing (06/29/2019);    Time  12    Period  Weeks    Status  Partially Met    Target Date  09/21/19      PT LONG TERM GOAL #4   Title  Increase L knee flexion AROM to 140 degrees to be able to complete ADLs without limitation of knee flexion    Baseline  PROM 95 degrees of flexion with pain at end range and AROM 80 degrees(04/18/2019); AAROM 110 with end range pain (05/18/2019); PROM 0-126, AROM ext -25, AAROM flex 115 (06/29/2019);    Time  12    Period  Weeks    Status  Partially Met    Target Date  09/21/19      PT LONG TERM GOAL #5   Title  Pt will increase LE strength to equal or greater than 4+/5 MMT in order to demonstrate improvement in strength and function.    Baseline  See objectives on IE(04/18/2019); still 2/10 L knee extension (lacking last 22 degrees against gravity) (05/18/2019); continues to lack terminal knee extension in open change position, improved to at least 4/5 with discomfort within available range (06/29/2019);    Time  12    Period  Weeks    Status  Partially Met    Target Date  09/21/19            Plan - 07/18/19 1444    Clinical Impression Statement  Patient tolerated treatment  well with no complaint of any lasting pain. Weight bearing on L LE was avoided during exercises. Patient is to return to physician tomorrow to discuss options given non-union of tibia. Updated HEP and focused today's visit on improving form for open chain hip exercises that patient can do while NWB. Patient was able to perform the exercises but would benefit from continued cuing and supervision while doing so due to difficulty remembering to stabilize with abdominals and correctly squeeze glute. Patient would benefit from continued management of limiting condition by skilled physical therapist to address remaining impairments and functional limitations to work towards stated goals and return to PLOF or maximal functional independence.    Personal Factors and Comorbidities  Comorbidity 2;Age    Comorbidities  L ankle surgery; hypertension; history of falling; L knee pain.    Examination-Activity Limitations  Bathing;Bed Mobility;Locomotion Level;Stairs;Stand;Hygiene/Grooming;Bend;Caring for Others;Sleep;Lift;Transfers;Toileting;Squat;Carry    Examination-Participation Restrictions  Yard Work;Driving;Meal Prep;Laundry;Shop;Cleaning    Stability/Clinical Decision Making  Evolving/Moderate complexity    Rehab Potential  Good    PT Frequency  2x / week    PT Duration  12 weeks    PT Treatment/Interventions  ADLs/Self Care Home Management;Cryotherapy;Moist Heat;Gait training;Stair training;Functional mobility training;Therapeutic activities;Therapeutic exercise;Balance training;Neuromuscular re-education;Manual techniques;Joint Manipulations;Passive range of motion;Dry needling;DME Instruction    PT Next Visit Plan  Strengthening, ROM and muscle endurance, gait training when appropriate    PT Home Exercise Plan  Medbridge: [email or web address]    Consulted and Agree with Plan of Care  Patient       Patient will benefit from skilled therapeutic intervention in order to improve the following deficits and impairments:   Abnormal gait, Decreased endurance, Decreased activity tolerance, Decreased range of motion, Decreased strength, Pain, Impaired flexibility, Difficulty walking, Decreased balance, Impaired perceived functional ability, Decreased knowledge of use of DME, Decreased mobility  Visit Diagnosis: Acute pain of left knee  Unsteadiness on feet  Muscle weakness (generalized)  Problem List There are no problems to display for this patient.   Everlean Alstrom. Graylon Good, PT, DPT 07/18/19, 2:45 PM  [facility] [address], [place], [address] Phone: [phone]   Fax:  [phone]  Name: Glenn Watson MRN: [id number] Date of Birth: [birth year]

## 2019-07-19 DIAGNOSIS — R600 Localized edema: Secondary | ICD-10-CM | POA: Diagnosis not present

## 2019-07-19 DIAGNOSIS — S82142D Displaced bicondylar fracture of left tibia, subsequent encounter for closed fracture with routine healing: Secondary | ICD-10-CM | POA: Diagnosis not present

## 2019-07-24 ENCOUNTER — Other Ambulatory Visit: Payer: Self-pay

## 2019-07-24 ENCOUNTER — Ambulatory Visit: Payer: PPO | Admitting: Physical Therapy

## 2019-07-24 DIAGNOSIS — M6281 Muscle weakness (generalized): Secondary | ICD-10-CM

## 2019-07-24 DIAGNOSIS — M25562 Pain in left knee: Secondary | ICD-10-CM | POA: Diagnosis not present

## 2019-07-24 DIAGNOSIS — R2681 Unsteadiness on feet: Secondary | ICD-10-CM

## 2019-07-24 NOTE — Therapy (Signed)
[facility] [address] [place], [address] Phone: [phone]   Fax:  [phone]  Physical Therapy Treatment  Patient Details  Name: Glenn Watson MRN: [id number] Date of Birth: 05-11-1946 Referring Provider (PT): Altamese [place], MD   Encounter Date: 07/24/2019  PT End of Session - 07/24/19 1215    Visit Number  26    Date for PT Re-Evaluation  09/21/19    Authorization Type  [employer] (new reporting period from 06/29/2019)    Authorization - Visit Number  6    Authorization - Number of Visits  10    PT Start Time  1120    PT Stop Time  1200    PT Time Calculation (min)  40 min    Activity Tolerance  Patient tolerated treatment well    Behavior During Therapy  [facility] for tasks assessed/performed       Past Medical History:  Diagnosis Date   Hypertension     Past Surgical History:  Procedure Laterality Date   APPENDECTOMY  2011   Dr. Bary Castilla   COLONOSCOPY WITH PROPOFOL N/A 02/10/2017   Procedure: COLONOSCOPY WITH PROPOFOL;  Surgeon: Christene Lye, MD;  Location: [facility] ENDOSCOPY;  Service: Endoscopy;  Laterality: N/A;    There were no vitals filed for this visit.  Subjective Assessment - 07/24/19 1119    Subjective  Patient reports that the short story is Dr. Marcelino Scot doesn't know why his leg didn't heal and he is not sure what to do about it. He says Dr. Marcelino Scot is to speak to some of his colleagues who specialize in knee replacements and get back to the patient this week. Patient is feeling disheartened but is grateful that he is not in a lot of pain. Reports Dr. Marcelino Scot looked at the exercises pt was given and agreed they were okay to perform. The break is where the quadricep tendon needs to attach during a total knee, which means he may end up without a knee joint. He is hoping this is a last ditch type thing. There was some talk about taking some bone tissue out of another part of  the body and putting into the tibia. He cannot remember if his weight bearing was changed. He thinks he said keep on doing whatever he was doing, but his memory is sort of clouded with all the bad news.    Pertinent History  L ankle surgery; hypertension; history of falling; L knee pain.    Limitations  Lifting;Standing;Walking;House hold activities    How long can you stand comfortably?  can do dishes    How long can you walk comfortably?  walks around outside some (patio and walkway to the patio - can get there without any problem): thinks he could make it about 15 min    Diagnostic tests  CT scan L knee report 07/18/2019 "IMPRESSION: 1. 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 2. Partial healing of the impacted fracture of the proximal fibula. 3. Moderate knee joint effusion."    Patient Stated Goals  Able to walk without AD    Currently in Pain?  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  Long brace  removed except for hip flexor stretch and sidelying hip abduction circles. Assisted patient in donning brace.   -Recumbent Bikeseat setting 14. For improved lower extremity ROM, muscular endurance, and activity tolerance; and to induce the analgesic effect of aerobic exercise, stimulate joint nutrition, and prepare body structures and systems for following interventions, freely rotatingx7mnutesduring subjective exam. moved seat up to level10with back and forth rotations with end range stretch on347m. Very gentle.   - L single leg bridge with distal femur on bolster (airex w+cylindrical red bolster + pillow stacked on it), 3x15. Clinician holding bolster in place and ensuring no weight bearing  through lower leg.  Sidelying with L hip up:  - L LE circles from hip clockwise, x 10 no resistance. Cuing for improved hip control.   Education on decreased weight bearing L LE. Education for obtaining glove and wrapping handles of walker to relieve pressure on heels of hands that is causing callouses and splitting of the skin.   Manual therapy:to reduce pain and tissue tension, improve range of motion, neuromodulation, in order to promote improved ability to complete functional activities.. -Supine L hamstring stretch 3x30 seconds. - supine L hip flexor stretch 3x30-45 seconds each side with manual overpressure and assistance holding contralateral knee to chest when stretching L leg.    Patient fatigued between sets and required  time for rest..    HOME EXERCISE PROGRAM Access Code: [email or web address]      URL: [email or web address]    Date: 07/18/2019  Prepared by: SaRosita Kea Exercises Sidelying Thoracic Rotation with Open Book - 20 reps Supine Lower Trunk Rotation - 20 reps Prone Press Up - 20 reps - 1 second hold Supine Active Straight Leg Raise - 1-2 sets - 10-20 reps - 1 every other day - 5 seconds to lower Sidelying Hip Flexion and Extension  - 2-3 sets - 15-20 reps - 1 every other day Prone Hip Extension with Bent Knee - One Pillow - 3 sets - 10-20 reps - 4 seconds hold - 1 every other day        PT Education - 07/24/19 1214    Education Details  exercise purpose/form. self management techniques. Education on decreased weight bearing L LE. Education for obtaining glove and wrapping handles of walker to relieve pressure on heels of hands that is causing callouses and splitting of the skin.    Person(s) Educated  Patient    Methods  Explanation;Demonstration;Tactile cues;Verbal cues    Comprehension  Returned demonstration;Verbal cues required;Need further instruction;Tactile cues required;Verbalized understanding       PT Short Term Goals - 04/24/19  1426      PT SHORT TERM GOAL #1   Title  Be independent with initial home exercise program for self-management of symptoms.    Baseline  initial HEP provided at IE (04/18/2019);    Time  2    Period  Weeks    Status  Achieved    Target Date  05/02/19        PT Long Term Goals - 06/29/19 1222      PT LONG TERM GOAL #1   Title  Be independent with a long-term home exercise program for self-management of symptoms.    Baseline  initial HEP provided at IE (04/18/2019); continues to participate in currently appropriate HEP (05/18/2019; 06/29/2019);    Time  12    Period  Weeks    Status  Partially Met    Target Date  09/21/19  PT LONG TERM GOAL #2   Title  Demonstrate improved FOTO score by 10 units to demonstrate improvement in overall condition and self-reported functional ability.    Baseline  44 (04/18/2019); 52 (05/18/2019); 52 (06/29/2019);    Time  12    Period  Weeks    Status  Partially Met    Target Date  09/21/19      PT LONG TERM GOAL #3   Title  Complete community, work and/or recreational activities without limitation due to current condition.    Baseline  Difficulites with walking, standing, shopping, driving, cooking and bathing, currently NWB L LE (04/18/2019); continues with similar difficulties but is now PWB25% and is able to drive, cross legs to don shoes (05/18/2019); improving but continues to have limitations and cannot perform activities that require single leg stance full weight bearing (06/29/2019);    Time  12    Period  Weeks    Status  Partially Met    Target Date  09/21/19      PT LONG TERM GOAL #4   Title  Increase L knee flexion AROM to 140 degrees to be able to complete ADLs without limitation of knee flexion    Baseline  PROM 95 degrees of flexion with pain at end range and AROM 80 degrees(04/18/2019); AAROM 110 with end range pain (05/18/2019); PROM 0-126, AROM ext -25, AAROM flex 115 (06/29/2019);    Time  12    Period  Weeks    Status   Partially Met    Target Date  09/21/19      PT LONG TERM GOAL #5   Title  Pt will increase LE strength to equal or greater than 4+/5 MMT in order to demonstrate improvement in strength and function.    Baseline  See objectives on IE(04/18/2019); still 2/10 L knee extension (lacking last 22 degrees against gravity) (05/18/2019); continues to lack terminal knee extension in open change position, improved to at least 4/5 with discomfort within available range (06/29/2019);    Time  12    Period  Weeks    Status  Partially Met    Target Date  09/21/19            Plan - 07/24/19 1654    Clinical Impression Statement  Patient arrives stating his physician is still considering options for how to help the non-healing in his lower leg. Continued with open chain or offloaded closed chain exercises for hip strength and mobility to prepare for return to weight bearing at future date. Patient had momentary discomfort while moving positions that resolved with repositioning.  Plan to contact referring physician to clarify weight bearing precautions. Patient would benefit from continued management of limiting condition by skilled physical therapist to address remaining impairments and functional limitations to work towards stated goals and return to PLOF or maximal functional independence.    Personal Factors and Comorbidities  Comorbidity 2;Age    Comorbidities  L ankle surgery; hypertension; history of falling; L knee pain.    Examination-Activity Limitations  Bathing;Bed Mobility;Locomotion Level;Stairs;Stand;Hygiene/Grooming;Bend;Caring for Others;Sleep;Lift;Transfers;Toileting;Squat;Carry    Examination-Participation Restrictions  Yard Work;Driving;Meal Prep;Laundry;Shop;Cleaning    Stability/Clinical Decision Making  Evolving/Moderate complexity    Rehab Potential  Good    PT Frequency  2x / week    PT Duration  12 weeks    PT Treatment/Interventions  ADLs/Self Care Home  Management;Cryotherapy;Moist Heat;Gait training;Stair training;Functional mobility training;Therapeutic activities;Therapeutic exercise;Balance training;Neuromuscular re-education;Manual techniques;Joint Manipulations;Passive range of motion;Dry needling;DME Instruction    PT Next  Visit Plan  Strengthening, ROM and muscle endurance, gait training when appropriate    PT Home Exercise Plan  Medbridge: [email or web address]    Consulted and Agree with Plan of Care  Patient       Patient will benefit from skilled therapeutic intervention in order to improve the following deficits and impairments:  Abnormal gait, Decreased endurance, Decreased activity tolerance, Decreased range of motion, Decreased strength, Pain, Impaired flexibility, Difficulty walking, Decreased balance, Impaired perceived functional ability, Decreased knowledge of use of DME, Decreased mobility  Visit Diagnosis: Acute pain of left knee  Unsteadiness on feet  Muscle weakness (generalized)     Problem List There are no problems to display for this patient.  Everlean Alstrom. Graylon Good, PT, DPT 07/24/19, 4:56 PM  [facility] [address] [place], [address] Phone: [phone]   Fax:  [phone]  Name: Glenn Watson MRN: [id number] Date of Birth: 08/25/1945

## 2019-07-25 ENCOUNTER — Telehealth: Payer: Self-pay | Admitting: Physical Therapy

## 2019-07-25 NOTE — Telephone Encounter (Addendum)
Called referring physician, Dr. Altamese Ayr, at [facility] [phone] to get an update on patient's weight bearing restrictions or other precautions. Spoke to [place] who was unable to access the info today, but took a message to get clarification from Dr. Marcelino Scot who is in the office tomorrow. She aggred to call back when she has the info. Provided call back number [place]. Graylon Good, PT, DPT 07/25/19, 1:38 PM   Returned call to [facility] after getting a message she asked for me to call back last Friday. Gwen confirmed patient is scheduled for surgery on 08/03/19 and that if he did come for PT prior to surgery that there were no weight bearing precautions. She did not know when pt would be ready to return to PT, so I agreed to wait to hear from the physician's office or patient in the future.   Everlean Alstrom. Graylon Good, PT, DPT 07/31/19, 3:06 PM

## 2019-07-27 ENCOUNTER — Ambulatory Visit: Payer: PPO | Admitting: Physical Therapy

## 2019-07-27 ENCOUNTER — Encounter: Payer: Self-pay | Admitting: Physical Therapy

## 2019-07-27 ENCOUNTER — Other Ambulatory Visit: Payer: Self-pay

## 2019-07-27 DIAGNOSIS — R2681 Unsteadiness on feet: Secondary | ICD-10-CM

## 2019-07-27 DIAGNOSIS — M25562 Pain in left knee: Secondary | ICD-10-CM

## 2019-07-27 DIAGNOSIS — M6281 Muscle weakness (generalized): Secondary | ICD-10-CM

## 2019-07-27 NOTE — Therapy (Signed)
[facility] [address], [place], [address] Phone: [phone]   Fax:  [phone]  Physical Therapy Treatment  Patient Details  Name: Glenn Watson MRN: [id number] Date of Birth: [birth year] Referring Provider (PT): Altamese [place], MD   Encounter Date: 07/27/2019  PT End of Session - 07/27/19 1940    Visit Number  27    Number of Visits  44    Date for PT Re-Evaluation  09/21/19    Authorization Type  [employer] (new reporting period from 06/29/2019)    Authorization - Visit Number  7    Authorization - Number of Visits  10    PT Start Time  0950    PT Stop Time  1030    PT Time Calculation (min)  40 min    Activity Tolerance  Patient tolerated treatment well    Behavior During Therapy  [facility] for tasks assessed/performed       Past Medical History:  Diagnosis Date  . Hypertension     Past Surgical History:  Procedure Laterality Date  . APPENDECTOMY  2011   Dr. Bary Castilla  . COLONOSCOPY WITH PROPOFOL N/A 02/10/2017   Procedure: COLONOSCOPY WITH PROPOFOL;  Surgeon: Christene Lye, MD;  Location: [facility] ENDOSCOPY;  Service: Endoscopy;  Laterality: N/A;    There were no vitals filed for this visit.  Subjective Assessment - 07/27/19 1726    Subjective  Patient reports he has no new updates from Dr. Marcelino Scot. He last called on Tuesday and was told he would not be back in the office until the following day. This PT has also not been updated on weight bearing precaution updates since leaving a message Tuesday. Patient reports no pain upon arrival. States he does have soreness in left glute following last treatment session and is completing his HEP successfully. Patient purchased cycling gloves and wrapped the handles of his walker with towels and states his hands are feeling much better and starting to heal.    Pertinent History  L ankle surgery; hypertension; history of falling; L knee pain.    Limitations  Lifting;Standing;Walking;House hold activities    How long can you stand comfortably?  can do dishes    How long can you walk comfortably?  walks around outside some (patio and walkway to the patio - can get there without any problem): thinks he could make it about 15 min    Diagnostic tests  CT scan L knee report 07/18/2019 "IMPRESSION: 1. 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 2. Partial healing of the impacted fracture of the proximal fibula. 3. Moderate knee joint effusion."    Patient Stated Goals  Able to walk without AD    Currently in Pain?  No/denies    Pain Onset  1 to 4 weeks ago         TREATMENT: denies sensitivity to latex CT scan shows non-union of L tibia  Therapeutic exercise:to centralize symptoms and improve ROM, strength, muscular endurance, and activity tolerance required for successful completion of functional activities.  Long brace donned entire visit.   - Seated L ankle eversion against blue theraband (40 reps), and black theraband 2x30 - seated L ankle inversion against black theraband 3x30 reps - long sitting L ankle dorsiflexion against black theraband, 3x30, heel off edge of mat - long sitting L ankle plantarflexion against silver band, 3x30 reps, heel off edge of mat  - L single leg bridgewith distal  femur on bolster (airex w+cylindrical red bolster + pillow stacked on it), 3x15. Clinician holding bolster in place and ensuring no weight bearing through lower leg.   Education on updated HEP to complete ankle exercises.   Manual therapy:to reduce pain and tissue tension, improve range of motion, neuromodulation, in order to promote improved ability to complete functional activities.. -Supine L hamstring stretch 3x30 seconds following each set of bridges.   HOME EXERCISE PROGRAM Access Code: [email or web address]  URL: [email or web address]  Date: 07/27/2019  Prepared by: Rosita Kea    Exercises Sidelying Thoracic Rotation with Open Book - 20 reps Supine Lower Trunk Rotation - 20 reps Prone Press Up - 20 reps - 1 second hold Supine Active Straight Leg Raise - 1-2 sets - 10-20 reps - 1 every other day - 5 seconds to lower Sidelying Hip Flexion and Extension with Baby - 2-3 sets - 15-20 reps - 1 every other day Prone Hip Extension with Bent Knee - One Pillow - 3 sets - 10-20 reps - 4 seconds hold - 1 every other day Seated Ankle Inversion with Anchored Resistance - 2-3 sets - 30-40 reps - 1 every other day or so - 2-3x weekly Seated Ankle Eversion with Resistance - 2-3 sets - 30-40 reps - 1 every other day or so - 2-3x weekly Long Sitting Ankle Dorsiflexion with Anchored Resistance - 2-3 sets - 30 reps - 1 every other day or so - 2-3x weekly Long Sitting Eccentric Ankle Plantar Flexion with Resistance - 2-3 sets - 30 reps - 1 every other day or so     PT Education - 07/27/19 1940    Education Details  exercise purpose/form. self management techniques    Person(s) Educated  Patient    Methods  Explanation;Demonstration;Tactile cues;Verbal cues;Handout    Comprehension  Verbalized understanding;Returned demonstration;Verbal cues required;Need further instruction;Tactile cues required       PT Short Term Goals - 04/24/19 1426      PT SHORT TERM GOAL #1   Title  Be independent with initial home exercise program for self-management of symptoms.    Baseline  initial HEP provided at IE (04/18/2019);    Time  2    Period  Weeks    Status  Achieved    Target Date  05/02/19        PT Long Term Goals - 06/29/19 1222      PT LONG TERM GOAL #1   Title  Be independent with a long-term home exercise program for self-management of symptoms.    Baseline  initial HEP provided at IE (04/18/2019); continues to participate in currently appropriate HEP (05/18/2019; 06/29/2019);    Time  12    Period  Weeks    Status  Partially Met    Target Date  09/21/19      PT LONG  TERM GOAL #2   Title  Demonstrate improved FOTO score by 10 units to demonstrate improvement in overall condition and self-reported functional ability.    Baseline  44 (04/18/2019); 52 (05/18/2019); 52 (06/29/2019);    Time  12    Period  Weeks    Status  Partially Met    Target Date  09/21/19      PT LONG TERM GOAL #3   Title  Complete community, work and/or recreational activities without limitation due to current condition.    Baseline  Difficulites with walking, standing, shopping, driving, cooking and bathing, currently NWB L LE (04/18/2019); continues with similar difficulties but is now  PWB25% and is able to drive, cross legs to don shoes (05/18/2019); improving but continues to have limitations and cannot perform activities that require single leg stance full weight bearing (06/29/2019);    Time  12    Period  Weeks    Status  Partially Met    Target Date  09/21/19      PT LONG TERM GOAL #4   Title  Increase L knee flexion AROM to 140 degrees to be able to complete ADLs without limitation of knee flexion    Baseline  PROM 95 degrees of flexion with pain at end range and AROM 80 degrees(04/18/2019); AAROM 110 with end range pain (05/18/2019); PROM 0-126, AROM ext -25, AAROM flex 115 (06/29/2019);    Time  12    Period  Weeks    Status  Partially Met    Target Date  09/21/19      PT LONG TERM GOAL #5   Title  Pt will increase LE strength to equal or greater than 4+/5 MMT in order to demonstrate improvement in strength and function.    Baseline  See objectives on IE(04/18/2019); still 2/10 L knee extension (lacking last 22 degrees against gravity) (05/18/2019); continues to lack terminal knee extension in open change position, improved to at least 4/5 with discomfort within available range (06/29/2019);    Time  12    Period  Weeks    Status  Partially Met    Target Date  09/21/19            Plan - 07/27/19 1944    Clinical Impression Statement  Patient tolerated  treatment well. Session focused on ankle exercises including adding to HEP to strengthen his kinetic chain from the ground up to prepare for return to gait in the future. Continued exercises that do not require L lower leg weight bearing. Patient able to progress to less use of arms for stabilization during single leg glute bridge. Patient continues to be severely restriction in functional mobility, ADL,s and IADLs due to impairments including weakness, ROM deficits, and weight bearing restrictions and non-union of fracture. Patient still awaiting guidance from physician on his next medical interventions. Patient would benefit from continued management of limiting condition by skilled physical therapist to address remaining impairments and functional limitations to work towards stated goals and return to PLOF or maximal functional independence.    Personal Factors and Comorbidities  Comorbidity 2;Age    Comorbidities  L ankle surgery; hypertension; history of falling; L knee pain.    Examination-Activity Limitations  Bathing;Bed Mobility;Locomotion Level;Stairs;Stand;Hygiene/Grooming;Bend;Caring for Others;Sleep;Lift;Transfers;Toileting;Squat;Carry    Examination-Participation Restrictions  Yard Work;Driving;Meal Prep;Laundry;Shop;Cleaning    Stability/Clinical Decision Making  Evolving/Moderate complexity    Rehab Potential  Good    PT Frequency  2x / week    PT Duration  12 weeks    PT Treatment/Interventions  ADLs/Self Care Home Management;Cryotherapy;Moist Heat;Gait training;Stair training;Functional mobility training;Therapeutic activities;Therapeutic exercise;Balance training;Neuromuscular re-education;Manual techniques;Joint Manipulations;Passive range of motion;Dry needling;DME Instruction    PT Next Visit Plan  Strengthening, ROM and muscle endurance, gait training when appropriate    PT Home Exercise Plan  Medbridge: [email or web address]    Consulted and Agree with Plan of Care  Patient       Patient  will benefit from skilled therapeutic intervention in order to improve the following deficits and impairments:  Abnormal gait, Decreased endurance, Decreased activity tolerance, Decreased range of motion, Decreased strength, Pain, Impaired flexibility, Difficulty walking, Decreased balance, Impaired perceived functional ability, Decreased knowledge of  use of DME, Decreased mobility  Visit Diagnosis: Acute pain of left knee  Unsteadiness on feet  Muscle weakness (generalized)     Problem List There are no problems to display for this patient.   Everlean Alstrom. Graylon Good, PT, DPT 07/27/19, 7:45 PM  [facility] [address], [place], [address] Phone: [phone]   Fax:  [phone]  Name: Glenn Watson MRN: [id number] Date of Birth: July 09, 1945

## 2019-07-31 ENCOUNTER — Other Ambulatory Visit
Admission: RE | Admit: 2019-07-31 | Discharge: 2019-07-31 | Disposition: A | Discharge status: Home / Self Care | Payer: PPO | Source: Ambulatory Visit | Attending: Orthopedic Surgery | Admitting: Orthopedic Surgery

## 2019-07-31 ENCOUNTER — Other Ambulatory Visit: Payer: Self-pay

## 2019-07-31 ENCOUNTER — Ambulatory Visit: Payer: PPO | Admitting: Physical Therapy

## 2019-07-31 DIAGNOSIS — Z20822 Contact with and (suspected) exposure to covid-19: Secondary | ICD-10-CM | POA: Diagnosis not present

## 2019-07-31 DIAGNOSIS — Z01812 Encounter for preprocedural laboratory examination: Secondary | ICD-10-CM | POA: Insufficient documentation

## 2019-08-01 ENCOUNTER — Encounter (HOSPITAL_COMMUNITY): Payer: Self-pay | Admitting: Orthopedic Surgery

## 2019-08-01 LAB — SARS CORONAVIRUS 2 (TAT 6-24 HRS): SARS Coronavirus 2: NEGATIVE

## 2019-08-01 NOTE — Progress Notes (Signed)
Anesthesia Chart Review: SAME DAY WORK-UP   Case: [id number] Date/Time: 08/03/19 0924   Procedure: OPEN REDUCTION INTERNAL FIXATION (ORIF) TIBIAL PLATEAU (Left )   Anesthesia type: General   Pre-op diagnosis: Nonunion Left Tibial Plateau   Location: [facility] OR ROOM 03 / [facility]   Surgeons: Altamese [place], MD      DISCUSSION: Patient is a 74 year old male scheduled for the above procedure.   History includes never smoker, post-operative N/V, HTN, anemia, skin cancer (BCC, melanoma), HLD, childhood asthma. Reports he is very sensitive to medications (propofol, midazolam, fentanyl, ephedrine used for 02/10/17 colonoscopy). Documented 35 beers/week.    Reported echo and carotid US in 2018 for lightheadedness, primarily positional. Echo unremarkable (normal LVEF, no AS) and reportedly normal carotid US (copy of report requested). EKG then showed mild bradycardia. Reported only occasional lightheadedness now (ie, sometimes when standing up from the floor mat at the gym). No chest pain or SOB. Treated from vertigo ~ 4 months ago.  He is a retired Software engineer. 07/31/19 COVID-19 test negative. He is for labs, EKG, and anesthesia team evaluation on the day of surgery.    VS: Ht 6\' 3"  (1.905 m)   Wt 102.1 kg   BMI 28.12 kg/m   PROVIDERS: Idelle Crouch, MD is PCP [facility], see [place])   LABS: He is for updated labs on arrival. Comparison labs from 02/15/19 ([facility]) show H/H 13.0/38.4, PLT 216, CMET WNL, A1c 5.7%.   IMAGES: CT left tibia/fibula 07/13/19: IMPRESSION: 1. 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 2. Partial healing of the impacted fracture of the proximal fibula. 3. Moderate knee joint effusion.   EKG: He is for updated EKG on arrival. Based on 05/09/17 EKG Result Narrative in [facility], his last EKG showed sinus bradycardia at 59 bpm and incomplete RBBB.    CV: Echo 05/10/17 ([facility]): INTERPRETATION NORMAL LEFT  VENTRICULAR SYSTOLIC FUNCTION NORMAL RIGHT VENTRICULAR SYSTOLIC FUNCTION MILD VALVULAR REGURGITATION (Mild MR, Trivial TR, Mild PR) NO VALVULAR STENOSIS  Notes in [facility] document a reportedly normal carotid US (from 12/2016). Report is not currently available, but requested.   Past Medical History:  Diagnosis Date  . Anemia   . Asthma    as a child, exercise asthma - none since high school"  . Cancer ([place])    skin cancer - basal, squamous- Mylenoma - stage 2- chest  . Complication of anesthesia    "very sensitive to medications" "Had colonscopy with just Draminine"  . Family history of adverse reaction to anesthesia    daughter- N/V  . Hyperlipidemia   . Hypertension   . PONV (postoperative nausea and vomiting)     Past Surgical History:  Procedure Laterality Date  . APPENDECTOMY  2011   Dr. Bary Castilla  . COLONOSCOPY WITH PROPOFOL N/A 02/10/2017   Procedure: COLONOSCOPY WITH PROPOFOL;  Surgeon: Christene Lye, MD;  Location: [facility] ENDOSCOPY;  Service: Endoscopy;  Laterality: N/A;    MEDICATIONS: No current facility-administered medications for this encounter.   Marland Kitchen amLODipine-benazepril (LOTREL) 5-10 MG per capsule  . aspirin 81 MG tablet  . CALCIUM PO  . Cholecalciferol (VITAMIN D) 2000 UNITS CAPS  . Ferrous Sulfate (IRON) 325 (65 FE) MG TABS  . meloxicam (MOBIC) 15 MG tablet  . Milk Thistle 1000 MG CAPS  . Multiple Vitamin (MULTIVITAMIN) tablet  . Omega-3 Fatty Acids (FISH OIL) 1200 MG CAPS  . Saw Palmetto, Serenoa repens, (SAW PALMETTO PO)  .  tamsulosin (FLOMAX) 0.4 MG CAPS capsule    Myra Gianotti, PA-C Surgical Short Stay/Anesthesiology [facility] Phone [phone] [facility] Phone [phone] 08/01/2019 4:16 PM

## 2019-08-01 NOTE — Anesthesia Preprocedure Evaluation (Addendum)
Anesthesia Evaluation  Patient identified by MRN, date of birth, ID band Patient awake    Reviewed: Allergy & Precautions, NPO status , Patient's Chart, lab work & pertinent test results  History of Anesthesia Complications (+) PONV and history of anesthetic complications  Airway Mallampati: II  TM Distance: >3 FB Neck ROM: Full    Dental no notable dental hx. (+) Dental Advisory Given   Pulmonary neg pulmonary ROS,    Pulmonary exam normal        Cardiovascular hypertension, Pt. on medications Normal cardiovascular exam  Echo 05/10/17 ([facility]): INTERPRETATION NORMAL LEFT VENTRICULAR SYSTOLIC FUNCTION NORMAL RIGHT VENTRICULAR SYSTOLIC FUNCTION MILD VALVULAR REGURGITATION (Mild MR, Trivial TR, Mild PR) NO VALVULAR STENOSIS   Neuro/Psych negative neurological ROS  negative psych ROS   GI/Hepatic negative GI ROS, Neg liver ROS,   Endo/Other  negative endocrine ROS  Renal/GU negative Renal ROS     Musculoskeletal   Abdominal   Peds  Hematology   Anesthesia Other Findings   Reproductive/Obstetrics                           Anesthesia Physical Anesthesia Plan  ASA: II  Anesthesia Plan: General   Post-op Pain Management:    Induction: Intravenous  PONV Risk Score and Plan: 4 or greater and Ondansetron, Dexamethasone and Midazolam  Airway Management Planned: Oral ETT  Additional Equipment:   Intra-op Plan:   Post-operative Plan: Extubation in OR  Informed Consent: I have reviewed the patients History and Physical, chart, labs and discussed the procedure including the risks, benefits and alternatives for the proposed anesthesia with the patient or authorized representative who has indicated his/her understanding and acceptance.     Dental advisory given  Plan Discussed with: CRNA and Anesthesiologist  Anesthesia Plan Comments: (PAT note written 08/01/2019 by Myra Gianotti,  PA-C. )      Anesthesia Quick Evaluation

## 2019-08-01 NOTE — Progress Notes (Addendum)
Mr. Lampa denies chest pain or shortness of breath. Mr. Nicolo texted negative 2/1 for Covid.  Mr. Frede asked if he may have water after midnight.  I explained that I would check  with his Dr. to see if he will be on ERAS diet. ( no orders are in epic).  I called Dr Carlean Jews office and spoke with Ria Comment, she will send a message to MD and PA-C with my question and call me with the answer. I asked Mr Rover about the lightheadedness he experienced in 2018 and was evaluated with an ECHO. Mr Merrifield said it didn't happen all the time, but sometimes when he changed positions, sitting to standing that he would become light headed. "I had the heart study and the Dr. Michela Pitcher it was fine. I only happens occasionally since that time, " like if I'm at the gym and I stand up from the floor mat, I might be little lightheaded.  Mr Filipek reported that 4 months ago he experenced vertigo. "It happened 3 - 4 days, I took Dramamine and Meclizine, each day it would be gone after lunch."  "I haven't had it since."  Mr Osten said he has not informed his PCP Dr Doy Hutching in [place].  I instructed patient to stop taking Mobic Vitamins and Herbal medications to day.  Ainsley Spinner, PA-C called back and said he would preferred that patient to be NPO after midnight - not on ERAS diet. I called Mr. Keath and informed him that he is to be NPO after midnight Wednesday.

## 2019-08-03 ENCOUNTER — Encounter: Payer: PPO | Admitting: Physical Therapy

## 2019-08-03 ENCOUNTER — Inpatient Hospital Stay (HOSPITAL_COMMUNITY): Payer: PPO

## 2019-08-03 ENCOUNTER — Inpatient Hospital Stay (HOSPITAL_COMMUNITY): Payer: PPO | Admitting: Vascular Surgery

## 2019-08-03 ENCOUNTER — Encounter (HOSPITAL_COMMUNITY): Payer: Self-pay | Admitting: Orthopedic Surgery

## 2019-08-03 ENCOUNTER — Other Ambulatory Visit: Payer: Self-pay

## 2019-08-03 ENCOUNTER — Encounter (HOSPITAL_COMMUNITY)
Admission: RE | Disposition: A | Discharge status: Home / Self Care | Payer: Self-pay | Source: Home / Self Care | Attending: Orthopedic Surgery

## 2019-08-03 ENCOUNTER — Inpatient Hospital Stay (HOSPITAL_COMMUNITY)
Admission: RE | Admit: 2019-08-03 | Discharge: 2019-08-04 | DRG: 493 | Disposition: A | Discharge status: Home / Self Care | Payer: PPO | Attending: Orthopedic Surgery | Admitting: Orthopedic Surgery

## 2019-08-03 DIAGNOSIS — I4510 Unspecified right bundle-branch block: Secondary | ICD-10-CM | POA: Diagnosis present

## 2019-08-03 DIAGNOSIS — S82142K Displaced bicondylar fracture of left tibia, subsequent encounter for closed fracture with nonunion: Secondary | ICD-10-CM | POA: Diagnosis not present

## 2019-08-03 DIAGNOSIS — I10 Essential (primary) hypertension: Secondary | ICD-10-CM | POA: Diagnosis present

## 2019-08-03 DIAGNOSIS — E785 Hyperlipidemia, unspecified: Secondary | ICD-10-CM | POA: Diagnosis present

## 2019-08-03 DIAGNOSIS — Z85828 Personal history of other malignant neoplasm of skin: Secondary | ICD-10-CM | POA: Diagnosis not present

## 2019-08-03 DIAGNOSIS — Z791 Long term (current) use of non-steroidal anti-inflammatories (NSAID): Secondary | ICD-10-CM | POA: Diagnosis not present

## 2019-08-03 DIAGNOSIS — Z7982 Long term (current) use of aspirin: Secondary | ICD-10-CM

## 2019-08-03 DIAGNOSIS — M1712 Unilateral primary osteoarthritis, left knee: Secondary | ICD-10-CM | POA: Diagnosis present

## 2019-08-03 DIAGNOSIS — Z20822 Contact with and (suspected) exposure to covid-19: Secondary | ICD-10-CM | POA: Diagnosis present

## 2019-08-03 DIAGNOSIS — J45909 Unspecified asthma, uncomplicated: Secondary | ICD-10-CM | POA: Diagnosis present

## 2019-08-03 DIAGNOSIS — Z01818 Encounter for other preprocedural examination: Secondary | ICD-10-CM | POA: Diagnosis not present

## 2019-08-03 DIAGNOSIS — E8889 Other specified metabolic disorders: Secondary | ICD-10-CM | POA: Diagnosis present

## 2019-08-03 DIAGNOSIS — D62 Acute posthemorrhagic anemia: Secondary | ICD-10-CM | POA: Diagnosis present

## 2019-08-03 DIAGNOSIS — S82102K Unspecified fracture of upper end of left tibia, subsequent encounter for closed fracture with nonunion: Secondary | ICD-10-CM | POA: Diagnosis not present

## 2019-08-03 DIAGNOSIS — Z419 Encounter for procedure for purposes other than remedying health state, unspecified: Secondary | ICD-10-CM

## 2019-08-03 DIAGNOSIS — X58XXXA Exposure to other specified factors, initial encounter: Secondary | ICD-10-CM | POA: Diagnosis present

## 2019-08-03 DIAGNOSIS — S82142D Displaced bicondylar fracture of left tibia, subsequent encounter for closed fracture with routine healing: Secondary | ICD-10-CM | POA: Diagnosis not present

## 2019-08-03 DIAGNOSIS — Z79899 Other long term (current) drug therapy: Secondary | ICD-10-CM

## 2019-08-03 DIAGNOSIS — S82142A Displaced bicondylar fracture of left tibia, initial encounter for closed fracture: Secondary | ICD-10-CM | POA: Diagnosis present

## 2019-08-03 HISTORY — PX: ORIF TIBIA PLATEAU: SHX2132

## 2019-08-03 HISTORY — DX: Hyperlipidemia, unspecified: E78.5

## 2019-08-03 HISTORY — DX: Family history of other specified conditions: Z84.89

## 2019-08-03 HISTORY — DX: Other complications of anesthesia, initial encounter: T88.59XA

## 2019-08-03 HISTORY — DX: Other specified postprocedural states: Z98.890

## 2019-08-03 HISTORY — DX: Nausea with vomiting, unspecified: R11.2

## 2019-08-03 HISTORY — DX: Malignant (primary) neoplasm, unspecified: C80.1

## 2019-08-03 HISTORY — DX: Anemia, unspecified: D64.9

## 2019-08-03 HISTORY — DX: Unspecified asthma, uncomplicated: J45.909

## 2019-08-03 LAB — COMPREHENSIVE METABOLIC PANEL
ALT: 21 U/L (ref 0–44)
AST: 19 U/L (ref 15–41)
Albumin: 4 g/dL (ref 3.5–5.0)
Alkaline Phosphatase: 60 U/L (ref 38–126)
Anion gap: 10 (ref 5–15)
BUN: 19 mg/dL (ref 8–23)
CO2: 23 mmol/L (ref 22–32)
Calcium: 9.4 mg/dL (ref 8.9–10.3)
Chloride: 107 mmol/L (ref 98–111)
Creatinine, Ser: 1.01 mg/dL (ref 0.61–1.24)
GFR calc Af Amer: >60 mL/min (ref >60)
GFR calc non Af Amer: >60 mL/min (ref >60)
Glucose, Bld: 110 mg/dL — ABNORMAL HIGH (ref 70–99)
Potassium: 4.1 mmol/L (ref 3.5–5.1)
Sodium: 140 mmol/L (ref 135–145)
Total Bilirubin: 0.9 mg/dL (ref 0.3–1.2)
Total Protein: 6.8 g/dL (ref 6.5–8.1)

## 2019-08-03 LAB — CBC WITH DIFFERENTIAL/PLATELET
Abs Immature Granulocytes: 0.01 10*3/uL (ref 0.00–0.07)
Basophils Absolute: 0 10*3/uL (ref 0.0–0.1)
Basophils Relative: 1 %
Eosinophils Absolute: 0.1 10*3/uL (ref 0.0–0.5)
Eosinophils Relative: 3 %
HCT: 41.5 % (ref 39.0–52.0)
Hemoglobin: 13.5 g/dL (ref 13.0–17.0)
Immature Granulocytes: 0 %
Lymphocytes Relative: 27 %
Lymphs Abs: 1.3 10*3/uL (ref 0.7–4.0)
MCH: 30.2 pg (ref 26.0–34.0)
MCHC: 32.5 g/dL (ref 30.0–36.0)
MCV: 92.8 fL (ref 80.0–100.0)
Monocytes Absolute: 0.4 10*3/uL (ref 0.1–1.0)
Monocytes Relative: 8 %
Neutro Abs: 3 10*3/uL (ref 1.7–7.7)
Neutrophils Relative %: 61 %
Platelets: 239 10*3/uL (ref 150–400)
RBC: 4.47 MIL/uL (ref 4.22–5.81)
RDW: 14.3 % (ref 11.5–15.5)
WBC: 4.9 10*3/uL (ref 4.0–10.5)
nRBC: 0 % (ref 0.0–0.2)

## 2019-08-03 LAB — PREALBUMIN: Prealbumin: 29 mg/dL (ref 18–38)

## 2019-08-03 LAB — PROTIME-INR
INR: 1 (ref 0.8–1.2)
Prothrombin Time: 13.3 seconds (ref 11.4–15.2)

## 2019-08-03 LAB — HEMOGLOBIN A1C
Hgb A1c MFr Bld: 5.4 % (ref 4.8–5.6)
Mean Plasma Glucose: 108.28 mg/dL

## 2019-08-03 LAB — SEDIMENTATION RATE: Sed Rate: 5 mm/hr (ref 0–16)

## 2019-08-03 LAB — APTT: aPTT: 31 seconds (ref 24–36)

## 2019-08-03 LAB — VITAMIN D 25 HYDROXY (VIT D DEFICIENCY, FRACTURES): Vit D, 25-Hydroxy: 98.2 ng/mL (ref 30–100)

## 2019-08-03 LAB — C-REACTIVE PROTEIN: CRP: <0.5 mg/dL (ref <1.0)

## 2019-08-03 LAB — TSH: TSH: 2.301 u[IU]/mL (ref 0.350–4.500)

## 2019-08-03 SURGERY — OPEN REDUCTION INTERNAL FIXATION (ORIF) TIBIAL PLATEAU
Anesthesia: General | Site: Leg Lower | Laterality: Left

## 2019-08-03 MED ORDER — LORAZEPAM 2 MG/ML IJ SOLN: 1.0000 mg | INTRAMUSCULAR | Status: DC | PRN

## 2019-08-03 MED ORDER — PROMETHAZINE HCL 25 MG/ML IJ SOLN
INTRAMUSCULAR | Status: AC
  Filled 2019-08-03: qty 1

## 2019-08-03 MED ORDER — LORAZEPAM 2 MG/ML IJ SOLN: 0.0000 mg | Freq: Four times a day (QID) | INTRAMUSCULAR | Status: DC

## 2019-08-03 MED ORDER — CEFAZOLIN SODIUM-DEXTROSE 2-4 GM/100ML-% IV SOLN
2.0000 g | Freq: Four times a day (QID) | INTRAVENOUS | Status: AC
  Administered 2019-08-03 – 2019-08-04 (×3): 2 g via INTRAVENOUS
  Filled 2019-08-03 (×3): qty 100

## 2019-08-03 MED ORDER — POTASSIUM CHLORIDE IN NACL 20-0.9 MEQ/L-% IV SOLN: INTRAVENOUS | Status: DC

## 2019-08-03 MED ORDER — ACETAMINOPHEN 500 MG PO TABS
1000.0000 mg | ORAL_TABLET | Freq: Once | ORAL | Status: AC
  Administered 2019-08-03: 12:00:00 1000 mg via ORAL
  Filled 2019-08-03: qty 2

## 2019-08-03 MED ORDER — PHENYLEPHRINE 40 MCG/ML (10ML) SYRINGE FOR IV PUSH (FOR BLOOD PRESSURE SUPPORT)
PREFILLED_SYRINGE | INTRAVENOUS | Status: DC | PRN
  Administered 2019-08-03 (×2): 80 ug via INTRAVENOUS

## 2019-08-03 MED ORDER — PHENYLEPHRINE HCL-NACL 10-0.9 MG/250ML-% IV SOLN
INTRAVENOUS | Status: DC | PRN
  Administered 2019-08-03: 20 ug/min via INTRAVENOUS

## 2019-08-03 MED ORDER — CHLORHEXIDINE GLUCONATE 4 % EX LIQD: 60.0000 mL | Freq: Once | CUTANEOUS | Status: DC

## 2019-08-03 MED ORDER — ROCURONIUM BROMIDE 10 MG/ML (PF) SYRINGE
PREFILLED_SYRINGE | INTRAVENOUS | Status: DC | PRN
  Administered 2019-08-03: 20 mg via INTRAVENOUS
  Administered 2019-08-03: 50 mg via INTRAVENOUS
  Administered 2019-08-03: 10 mg via INTRAVENOUS
  Administered 2019-08-03 (×2): 20 mg via INTRAVENOUS
  Administered 2019-08-03: 30 mg via INTRAVENOUS

## 2019-08-03 MED ORDER — DOCUSATE SODIUM 100 MG PO CAPS
100.0000 mg | ORAL_CAPSULE | Freq: Two times a day (BID) | ORAL | Status: DC
  Administered 2019-08-03 – 2019-08-04 (×2): 100 mg via ORAL
  Filled 2019-08-03 (×2): qty 1

## 2019-08-03 MED ORDER — THIAMINE HCL 100 MG/ML IJ SOLN
100.0000 mg | Freq: Every day | INTRAMUSCULAR | Status: DC
  Filled 2019-08-03 (×2): qty 1

## 2019-08-03 MED ORDER — ENOXAPARIN SODIUM 40 MG/0.4ML ~~LOC~~ SOLN
40.0000 mg | SUBCUTANEOUS | Status: DC
  Administered 2019-08-04: 11:00:00 40 mg via SUBCUTANEOUS
  Filled 2019-08-03: qty 0.4

## 2019-08-03 MED ORDER — METOCLOPRAMIDE HCL 5 MG/ML IJ SOLN: 5.0000 mg | Freq: Three times a day (TID) | INTRAMUSCULAR | Status: DC | PRN

## 2019-08-03 MED ORDER — PROPOFOL 10 MG/ML IV BOLUS
INTRAVENOUS | Status: DC | PRN
  Administered 2019-08-03: 150 mg via INTRAVENOUS

## 2019-08-03 MED ORDER — THIAMINE HCL 100 MG PO TABS
100.0000 mg | ORAL_TABLET | Freq: Every day | ORAL | Status: DC
  Administered 2019-08-03 – 2019-08-04 (×2): 100 mg via ORAL
  Filled 2019-08-03 (×2): qty 1

## 2019-08-03 MED ORDER — FENTANYL CITRATE (PF) 100 MCG/2ML IJ SOLN: 25.0000 ug | INTRAMUSCULAR | Status: DC | PRN

## 2019-08-03 MED ORDER — FENTANYL CITRATE (PF) 250 MCG/5ML IJ SOLN
INTRAMUSCULAR | Status: DC | PRN
  Administered 2019-08-03: 50 ug via INTRAVENOUS
  Administered 2019-08-03: 100 ug via INTRAVENOUS
  Administered 2019-08-03 (×2): 50 ug via INTRAVENOUS

## 2019-08-03 MED ORDER — CEFAZOLIN SODIUM-DEXTROSE 2-4 GM/100ML-% IV SOLN
2.0000 g | INTRAVENOUS | Status: AC
  Administered 2019-08-03: 15:00:00 2 g via INTRAVENOUS

## 2019-08-03 MED ORDER — MIDAZOLAM HCL 5 MG/5ML IJ SOLN
INTRAMUSCULAR | Status: DC | PRN
  Administered 2019-08-03: 2 mg via INTRAVENOUS

## 2019-08-03 MED ORDER — PROMETHAZINE HCL 25 MG/ML IJ SOLN
6.2500 mg | INTRAMUSCULAR | Status: AC | PRN
  Administered 2019-08-03 (×2): 6.25 mg via INTRAVENOUS

## 2019-08-03 MED ORDER — LIDOCAINE 2% (20 MG/ML) 5 ML SYRINGE
INTRAMUSCULAR | Status: AC
  Filled 2019-08-03: qty 5

## 2019-08-03 MED ORDER — LORAZEPAM 0.5 MG PO TABS: 1.0000 mg | ORAL_TABLET | ORAL | Status: DC | PRN

## 2019-08-03 MED ORDER — ONDANSETRON HCL 4 MG/2ML IJ SOLN
INTRAMUSCULAR | Status: DC | PRN
  Administered 2019-08-03: 4 mg via INTRAVENOUS

## 2019-08-03 MED ORDER — ONDANSETRON HCL 4 MG PO TABS: 4.0000 mg | ORAL_TABLET | Freq: Four times a day (QID) | ORAL | Status: DC | PRN

## 2019-08-03 MED ORDER — ONDANSETRON HCL 4 MG/2ML IJ SOLN: 4.0000 mg | Freq: Four times a day (QID) | INTRAMUSCULAR | Status: DC | PRN

## 2019-08-03 MED ORDER — ONDANSETRON HCL 4 MG/2ML IJ SOLN
INTRAMUSCULAR | Status: AC
  Filled 2019-08-03: qty 2

## 2019-08-03 MED ORDER — ACETAMINOPHEN 500 MG PO TABS
500.0000 mg | ORAL_TABLET | Freq: Three times a day (TID) | ORAL | Status: DC
  Administered 2019-08-03 – 2019-08-04 (×2): 500 mg via ORAL
  Filled 2019-08-03 (×2): qty 1

## 2019-08-03 MED ORDER — LACTATED RINGERS IV SOLN: INTRAVENOUS | Status: DC | PRN

## 2019-08-03 MED ORDER — HYDROCODONE-ACETAMINOPHEN 5-325 MG PO TABS
1.0000 | ORAL_TABLET | Freq: Four times a day (QID) | ORAL | Status: DC | PRN
  Filled 2019-08-03: qty 1

## 2019-08-03 MED ORDER — LACTATED RINGERS IV SOLN: INTRAVENOUS | Status: DC

## 2019-08-03 MED ORDER — POVIDONE-IODINE 10 % EX SWAB: 2.0000 "application " | Freq: Once | CUTANEOUS | Status: DC

## 2019-08-03 MED ORDER — ACETAMINOPHEN 325 MG PO TABS
325.0000 mg | ORAL_TABLET | Freq: Four times a day (QID) | ORAL | Status: DC | PRN
  Administered 2019-08-04 (×2): 650 mg via ORAL
  Filled 2019-08-03 (×2): qty 2

## 2019-08-03 MED ORDER — LIDOCAINE 2% (20 MG/ML) 5 ML SYRINGE
INTRAMUSCULAR | Status: DC | PRN
  Administered 2019-08-03: 80 mg via INTRAVENOUS

## 2019-08-03 MED ORDER — 0.9 % SODIUM CHLORIDE (POUR BTL) OPTIME
TOPICAL | Status: DC | PRN
  Administered 2019-08-03: 1000 mL

## 2019-08-03 MED ORDER — DEXAMETHASONE SODIUM PHOSPHATE 10 MG/ML IJ SOLN
INTRAMUSCULAR | Status: DC | PRN
  Administered 2019-08-03: 4 mg via INTRAVENOUS

## 2019-08-03 MED ORDER — DEXAMETHASONE SODIUM PHOSPHATE 10 MG/ML IJ SOLN
INTRAMUSCULAR | Status: AC
  Filled 2019-08-03: qty 1

## 2019-08-03 MED ORDER — CELECOXIB 200 MG PO CAPS
400.0000 mg | ORAL_CAPSULE | Freq: Once | ORAL | Status: AC
  Administered 2019-08-03: 12:00:00 400 mg via ORAL
  Filled 2019-08-03: qty 2

## 2019-08-03 MED ORDER — PROPOFOL 10 MG/ML IV BOLUS
INTRAVENOUS | Status: AC
  Filled 2019-08-03: qty 20

## 2019-08-03 MED ORDER — SUGAMMADEX SODIUM 200 MG/2ML IV SOLN
INTRAVENOUS | Status: DC | PRN
  Administered 2019-08-03: 200 mg via INTRAVENOUS

## 2019-08-03 MED ORDER — MORPHINE SULFATE (PF) 2 MG/ML IV SOLN: 0.5000 mg | INTRAVENOUS | Status: DC | PRN

## 2019-08-03 MED ORDER — TRAMADOL HCL 50 MG PO TABS: 50.0000 mg | ORAL_TABLET | Freq: Four times a day (QID) | ORAL | Status: DC | PRN

## 2019-08-03 MED ORDER — FENTANYL CITRATE (PF) 250 MCG/5ML IJ SOLN
INTRAMUSCULAR | Status: AC
  Filled 2019-08-03: qty 5

## 2019-08-03 MED ORDER — LORAZEPAM 2 MG/ML IJ SOLN: 0.0000 mg | Freq: Two times a day (BID) | INTRAMUSCULAR | Status: DC

## 2019-08-03 MED ORDER — ADULT MULTIVITAMIN W/MINERALS CH
1.0000 | ORAL_TABLET | Freq: Every day | ORAL | Status: DC
  Administered 2019-08-03 – 2019-08-04 (×2): 1 via ORAL
  Filled 2019-08-03 (×2): qty 1

## 2019-08-03 MED ORDER — FOLIC ACID 1 MG PO TABS
1.0000 mg | ORAL_TABLET | Freq: Every day | ORAL | Status: DC
  Administered 2019-08-03 – 2019-08-04 (×2): 1 mg via ORAL
  Filled 2019-08-03 (×2): qty 1

## 2019-08-03 MED ORDER — METOCLOPRAMIDE HCL 5 MG PO TABS: 5.0000 mg | ORAL_TABLET | Freq: Three times a day (TID) | ORAL | Status: DC | PRN

## 2019-08-03 MED ORDER — HYDROMORPHONE HCL 1 MG/ML IJ SOLN
INTRAMUSCULAR | Status: AC
  Filled 2019-08-03: qty 0.5

## 2019-08-03 MED ORDER — PANTOPRAZOLE SODIUM 40 MG PO TBEC
40.0000 mg | DELAYED_RELEASE_TABLET | Freq: Every day | ORAL | Status: DC
  Administered 2019-08-03 – 2019-08-04 (×2): 40 mg via ORAL
  Filled 2019-08-03 (×2): qty 1

## 2019-08-03 MED ORDER — MIDAZOLAM HCL 2 MG/2ML IJ SOLN
INTRAMUSCULAR | Status: AC
  Filled 2019-08-03: qty 2

## 2019-08-03 MED ORDER — HYDROMORPHONE HCL 1 MG/ML IJ SOLN
INTRAMUSCULAR | Status: DC | PRN
  Administered 2019-08-03: .5 mg via INTRAVENOUS

## 2019-08-03 SURGICAL SUPPLY — 105 items
BANDAGE ESMARK 6X9 LF (GAUZE/BANDAGES/DRESSINGS) ×1 IMPLANT
BIT DRILL 100X2.5XANTM LCK (BIT) ×1 IMPLANT
BIT DRILL CAL (BIT) ×1 IMPLANT
BIT DRL 100X2.5XANTM LCK (BIT) ×1
BLADE CLIPPER SURG (BLADE) IMPLANT
BLADE SURG 10 STRL SS (BLADE) ×3 IMPLANT
BLADE SURG 15 STRL LF DISP TIS (BLADE) ×1 IMPLANT
BLADE SURG 15 STRL SS (BLADE) ×2
BNDG COHESIVE 4X5 TAN STRL (GAUZE/BANDAGES/DRESSINGS) ×3 IMPLANT
BNDG ELASTIC 4X5.8 VLCR NS LF (GAUZE/BANDAGES/DRESSINGS) ×3 IMPLANT
BNDG ELASTIC 4X5.8 VLCR STR LF (GAUZE/BANDAGES/DRESSINGS) ×3 IMPLANT
BNDG ELASTIC 6X10 VLCR STRL LF (GAUZE/BANDAGES/DRESSINGS) ×3 IMPLANT
BNDG ELASTIC 6X5.8 VLCR STR LF (GAUZE/BANDAGES/DRESSINGS) ×3 IMPLANT
BNDG ESMARK 6X9 LF (GAUZE/BANDAGES/DRESSINGS) ×3
BNDG GAUZE ELAST 4 BULKY (GAUZE/BANDAGES/DRESSINGS) ×3 IMPLANT
BONE CANC CHIPS 40CC CAN1/2 (Bone Implant) ×3 IMPLANT
BRUSH SCRUB EZ PLAIN DRY (MISCELLANEOUS) ×6 IMPLANT
CANISTER SUCT 3000ML PPV (MISCELLANEOUS) ×3 IMPLANT
CANISTER WOUND CARE 500ML ATS (WOUND CARE) ×3 IMPLANT
CHIPS CANC BONE 40CC CAN1/2 (Bone Implant) ×1 IMPLANT
COVER SURGICAL LIGHT HANDLE (MISCELLANEOUS) ×3 IMPLANT
COVER WAND RF STERILE (DRAPES) ×3 IMPLANT
CUFF TOURN SGL QUICK 34 (TOURNIQUET CUFF) ×2
CUFF TRNQT CYL 34X4.125X (TOURNIQUET CUFF) ×1 IMPLANT
DRAPE C-ARM 42X72 X-RAY (DRAPES) ×3 IMPLANT
DRAPE C-ARMOR (DRAPES) ×3 IMPLANT
DRAPE EXTREMITY T 121X128X90 (DISPOSABLE) ×3 IMPLANT
DRAPE HALF SHEET 40X57 (DRAPES) IMPLANT
DRAPE INCISE IOBAN 66X45 STRL (DRAPES) ×3 IMPLANT
DRAPE ORTHO SPLIT 87X125 STRL (DRAPES) ×6 IMPLANT
DRAPE U-SHAPE 47X51 STRL (DRAPES) ×3 IMPLANT
DRESSING PREVENA PLUS CUSTOM (GAUZE/BANDAGES/DRESSINGS) ×1 IMPLANT
DRILL BIT 2.5MM (BIT) ×2
DRILL BIT CAL (BIT) ×3
DRSG ADAPTIC 3X8 NADH LF (GAUZE/BANDAGES/DRESSINGS) ×3 IMPLANT
DRSG MEPILEX BORDER 4X4 (GAUZE/BANDAGES/DRESSINGS) ×3 IMPLANT
DRSG MEPITEL 4X7.2 (GAUZE/BANDAGES/DRESSINGS) ×3 IMPLANT
DRSG PAD ABDOMINAL 8X10 ST (GAUZE/BANDAGES/DRESSINGS) ×6 IMPLANT
DRSG PREVENA PLUS CUSTOM (GAUZE/BANDAGES/DRESSINGS) ×3
ELECT REM PT RETURN 9FT ADLT (ELECTROSURGICAL) ×3
ELECTRODE REM PT RTRN 9FT ADLT (ELECTROSURGICAL) ×1 IMPLANT
GAUZE SPONGE 4X4 12PLY STRL (GAUZE/BANDAGES/DRESSINGS) ×3 IMPLANT
GAUZE SPONGE 4X4 12PLY STRL LF (GAUZE/BANDAGES/DRESSINGS) ×3 IMPLANT
GLOVE BIO SURGEON STRL SZ 6.5 (GLOVE) ×8 IMPLANT
GLOVE BIO SURGEON STRL SZ7.5 (GLOVE) ×3 IMPLANT
GLOVE BIO SURGEON STRL SZ8 (GLOVE) ×9 IMPLANT
GLOVE BIO SURGEONS STRL SZ 6.5 (GLOVE) ×4
GLOVE BIOGEL PI IND STRL 6.5 (GLOVE) ×2 IMPLANT
GLOVE BIOGEL PI IND STRL 7.5 (GLOVE) ×1 IMPLANT
GLOVE BIOGEL PI IND STRL 8 (GLOVE) ×1 IMPLANT
GLOVE BIOGEL PI INDICATOR 6.5 (GLOVE) ×4
GLOVE BIOGEL PI INDICATOR 7.5 (GLOVE) ×2
GLOVE BIOGEL PI INDICATOR 8 (GLOVE) ×2
GLOVE SURG SS PI 6.5 STRL IVOR (GLOVE) ×3 IMPLANT
GOWN STRL REUS W/ TWL LRG LVL3 (GOWN DISPOSABLE) ×2 IMPLANT
GOWN STRL REUS W/ TWL XL LVL3 (GOWN DISPOSABLE) ×1 IMPLANT
GOWN STRL REUS W/TWL LRG LVL3 (GOWN DISPOSABLE) ×4
GOWN STRL REUS W/TWL XL LVL3 (GOWN DISPOSABLE) ×2
IMMOBILIZER KNEE 22 UNIV (SOFTGOODS) ×3 IMPLANT
K-WIRE ACE 1.6X6 (WIRE) ×12
KIT BASIN OR (CUSTOM PROCEDURE TRAY) ×3 IMPLANT
KIT INFUSE LRG II (Orthopedic Implant) ×6 IMPLANT
KIT TURNOVER KIT B (KITS) ×3 IMPLANT
KWIRE ACE 1.6X6 (WIRE) ×4 IMPLANT
NDL SUT 6 .5 CRC .975X.05 MAYO (NEEDLE) IMPLANT
NEEDLE MAYO TAPER (NEEDLE)
NS IRRIG 1000ML POUR BTL (IV SOLUTION) ×3 IMPLANT
PACK ORTHO EXTREMITY (CUSTOM PROCEDURE TRAY) ×3 IMPLANT
PAD ABD 8X10 STRL (GAUZE/BANDAGES/DRESSINGS) ×6 IMPLANT
PAD ARMBOARD 7.5X6 YLW CONV (MISCELLANEOUS) ×6 IMPLANT
PAD CAST 4YDX4 CTTN HI CHSV (CAST SUPPLIES) ×1 IMPLANT
PADDING CAST COTTON 4X4 STRL (CAST SUPPLIES) ×2
PADDING CAST COTTON 6X4 STRL (CAST SUPPLIES) ×3 IMPLANT
PLATE LOCK LG 7H LT PROX TIB (Plate) ×3 IMPLANT
SCREW CORTICAL 3.5MM  34MM (Screw) ×4 IMPLANT
SCREW CORTICAL 3.5MM  44MM (Screw) ×2 IMPLANT
SCREW CORTICAL 3.5MM 34MM (Screw) ×2 IMPLANT
SCREW CORTICAL 3.5MM 40MM (Screw) ×3 IMPLANT
SCREW CORTICAL 3.5MM 44MM (Screw) ×1 IMPLANT
SCREW LOCK CORT STAR 3.5X80 (Screw) ×3 IMPLANT
SCREW LOCK CORT STAR 3.5X85 (Screw) ×9 IMPLANT
SCREW LOCK CORT STAR 3.5X90 (Screw) ×6 IMPLANT
SCREW LP 3.5X90MM (Screw) ×6 IMPLANT
SPONGE LAP 18X18 RF (DISPOSABLE) ×3 IMPLANT
SPONGE LAP 18X18 X RAY DECT (DISPOSABLE) ×6 IMPLANT
STAPLER VISISTAT 35W (STAPLE) ×3 IMPLANT
STOCKINETTE IMPERVIOUS LG (DRAPES) ×3 IMPLANT
SUCTION FRAZIER HANDLE 10FR (MISCELLANEOUS) ×2
SUCTION TUBE FRAZIER 10FR DISP (MISCELLANEOUS) ×1 IMPLANT
SUT ETHILON 2 0 FS 18 (SUTURE) ×9 IMPLANT
SUT ETHILON 3 0 PS 1 (SUTURE) IMPLANT
SUT PROLENE 0 CT 2 (SUTURE) ×6 IMPLANT
SUT VIC AB 0 CT1 27 (SUTURE) ×2
SUT VIC AB 0 CT1 27XBRD ANBCTR (SUTURE) ×1 IMPLANT
SUT VIC AB 1 CT1 27 (SUTURE) ×2
SUT VIC AB 1 CT1 27XBRD ANBCTR (SUTURE) ×1 IMPLANT
SUT VIC AB 2-0 CT1 27 (SUTURE) ×4
SUT VIC AB 2-0 CT1 TAPERPNT 27 (SUTURE) ×2 IMPLANT
TOWEL GREEN STERILE (TOWEL DISPOSABLE) ×6 IMPLANT
TOWEL GREEN STERILE FF (TOWEL DISPOSABLE) ×3 IMPLANT
TRAY FOLEY MTR SLVR 16FR STAT (SET/KITS/TRAYS/PACK) IMPLANT
TUBE CONNECTING 12'X1/4 (SUCTIONS) ×1
TUBE CONNECTING 12X1/4 (SUCTIONS) ×2 IMPLANT
WATER STERILE IRR 1000ML POUR (IV SOLUTION) ×6 IMPLANT
YANKAUER SUCT BULB TIP NO VENT (SUCTIONS) ×6 IMPLANT

## 2019-08-03 NOTE — Anesthesia Procedure Notes (Signed)
Procedure Name: Intubation Date/Time: 08/03/2019 3:05 PM Performed by: Amadeo Garnet, CRNA Pre-anesthesia Checklist: Patient identified, Emergency Drugs available, Suction available, Patient being monitored and Timeout performed Patient Re-evaluated:Patient Re-evaluated prior to induction Oxygen Delivery Method: Circle system utilized Preoxygenation: Pre-oxygenation with 100% oxygen Induction Type: IV induction Ventilation: Mask ventilation without difficulty Laryngoscope Size: Mac and 4 Grade View: Grade I Tube type: Oral Tube size: 7.5 mm Number of attempts: 1 Airway Equipment and Method: Stylet Placement Confirmation: ETT inserted through vocal cords under direct vision,  positive ETCO2 and breath sounds checked- equal and bilateral Secured at: 22 cm Tube secured with: Tape Dental Injury: Teeth and Oropharynx as per pre-operative assessment

## 2019-08-03 NOTE — Transfer of Care (Signed)
Immediate Anesthesia Transfer of Care Note  Patient: Glenn Watson  Procedure(s) Performed: OPEN REDUCTION INTERNAL FIXATION (ORIF) TIBIAL PLATEAU (Left Leg Lower)  Patient Location: PACU  Anesthesia Type:General  Level of Consciousness: awake, alert  and oriented  Airway & Oxygen Therapy: Patient Spontanous Breathing and Patient connected to face mask oxygen  Post-op Assessment: Report given to RN and Post -op Vital signs reviewed and stable  Post vital signs: Reviewed and stable  Last Vitals:  Vitals Value Taken Time  BP 145/77 08/03/19 1841  Temp    Pulse 63 08/03/19 1843  Resp 13 08/03/19 1843  SpO2 99 % 08/03/19 1843  Vitals shown include unvalidated device data.  Last Pain:  Vitals:   08/03/19 0913  TempSrc: Oral  PainSc: 0-No pain      Patients Stated Pain Goal: 3 ([id number])  Complications: No apparent anesthesia complications

## 2019-08-03 NOTE — Anesthesia Postprocedure Evaluation (Signed)
Anesthesia Post Note  Patient: [name]  Procedure(s) Performed: OPEN REDUCTION INTERNAL FIXATION (ORIF) TIBIAL PLATEAU (Left Leg Lower)     Patient location during evaluation: PACU Anesthesia Type: General Level of consciousness: awake and alert Pain management: pain level controlled Vital Signs Assessment: post-procedure vital signs reviewed and stable Respiratory status: spontaneous breathing, nonlabored ventilation and respiratory function stable Cardiovascular status: blood pressure returned to baseline and stable Postop Assessment: no apparent nausea or vomiting Anesthetic complications: no    Last Vitals:  Vitals:   08/03/19 1930 08/03/19 2000  BP: (!) 155/80 (!) 154/76  Pulse: 61 69  Resp: 11 18  Temp: 37.1 C (!) 36.4 C  SpO2: 97% 100%    Last Pain:  Vitals:   08/03/19 2000  TempSrc: Oral  PainSc: 3                  Mardy Lucier,W. EDMOND

## 2019-08-03 NOTE — H&P (Signed)
Orthopaedic Trauma Service (OTS) Consult   Patient ID: Glenn Watson MRN: [id number] DOB/AGE: 04-May-1946 74 y.o.    HPI: Glenn Watson is an 74 y.o. white male who sustained a left bicondylar tibial plateau fracture May 27, 2019.  Patient was eventually sent to our office for further evaluation and he presented he was several weeks out from injury for continued operative treatment was made.  Patient then continue to follow-up with orthopedics in [place].  Last time he was seen at our office was 04/24/2019.  Was reevaluated in our office at the end of January 2021 where she was noted to have a of his left bicondylar tibial plateau fracture with significant malalignment.  Viewed with the patient and he wishes to proceed with surgical repair of his left tibial plateau nonunion.  Past Medical History:  Diagnosis Date  . Anemia   . Asthma    as a child, exercise asthma - none since high school"  . Cancer ([place])    skin cancer - basal, squamous- Mylenoma - stage 2- chest  . Complication of anesthesia    "very sensitive to medications" "Had colonscopy with just Draminine"  . Family history of adverse reaction to anesthesia    daughter- N/V  . Hyperlipidemia   . Hypertension   . PONV (postoperative nausea and vomiting)     Past Surgical History:  Procedure Laterality Date  . APPENDECTOMY  2011   Dr. Bary Castilla  . COLONOSCOPY WITH PROPOFOL N/A 02/10/2017   Procedure: COLONOSCOPY WITH PROPOFOL;  Surgeon: Christene Lye, MD;  Location: [facility] ENDOSCOPY;  Service: Endoscopy;  Laterality: N/A;    History reviewed. No pertinent family history.  Social History:  reports that he has never smoked. He has never used smokeless tobacco. He reports current alcohol use of about 35.0 standard drinks of alcohol per week. He reports that he does not use drugs.  Allergies: No Known Allergies  Medications: I have reviewed the patient's current medications. Current Meds   Medication Sig  . amLODipine-benazepril (LOTREL) 5-10 MG per capsule Take 1 capsule by mouth daily before breakfast.   . aspirin 81 MG tablet Take 81 mg by mouth daily after lunch.   Marland Kitchen CALCIUM PO Take 1,000 mg by mouth daily after lunch.  . Cholecalciferol (VITAMIN D) 2000 UNITS CAPS Take 4,000 Units by mouth daily before breakfast.   . Ferrous Sulfate (IRON) 325 (65 FE) MG TABS Take 325 mg by mouth daily after lunch.   . meloxicam (MOBIC) 15 MG tablet Take 15 mg by mouth daily after lunch.   . Milk Thistle 1000 MG CAPS Take 1,000 mg by mouth daily after lunch.  . Multiple Vitamin (MULTIVITAMIN) tablet Take 1 tablet by mouth daily after lunch.   . Omega-3 Fatty Acids (FISH OIL) 1200 MG CAPS Take 1,200 mg by mouth daily before breakfast.  . Saw Palmetto, Serenoa repens, (SAW PALMETTO PO) Take 1,200 mg by mouth daily after lunch.  . tamsulosin (FLOMAX) 0.4 MG CAPS capsule Take 0.4 mg by mouth daily after lunch.    Results for orders placed or performed during the hospital encounter of 08/03/19 (from the past 48 hour(s))  CBC WITH DIFFERENTIAL     Status: None   Collection Time: 08/03/19  9:13 AM  Result Value Ref Range   WBC 4.9 4.0 - 10.5 K/uL   RBC 4.47 4.22 - 5.81 MIL/uL   Hemoglobin 13.5 13.0 - 17.0 g/dL   HCT 41.5 39.0 -  52.0 %   MCV 92.8 80.0 - 100.0 fL   MCH 30.2 26.0 - 34.0 pg   MCHC 32.5 30.0 - 36.0 g/dL   RDW 14.3 11.5 - 15.5 %   Platelets 239 150 - 400 K/uL   nRBC 0.0 0.0 - 0.2 %   Neutrophils Relative % 61 %   Neutro Abs 3.0 1.7 - 7.7 K/uL   Lymphocytes Relative 27 %   Lymphs Abs 1.3 0.7 - 4.0 K/uL   Monocytes Relative 8 %   Monocytes Absolute 0.4 0.1 - 1.0 K/uL   Eosinophils Relative 3 %   Eosinophils Absolute 0.1 0.0 - 0.5 K/uL   Basophils Relative 1 %   Basophils Absolute 0.0 0.0 - 0.1 K/uL   Immature Granulocytes 0 %   Abs Immature Granulocytes 0.01 0.00 - 0.07 K/uL    Comment: Performed at [facility] [address] [place], [place] [address]   Protime-INR     Status: None   Collection Time: 08/03/19  9:13 AM  Result Value Ref Range   Prothrombin Time 13.3 11.4 - 15.2 seconds   INR 1.0 0.8 - 1.2    Comment: (NOTE) INR goal varies based on device and disease states. Performed at [facility], [place] [address] [place], [place] [address]   APTT     Status: None   Collection Time: 08/03/19  9:13 AM  Result Value Ref Range   aPTT 31 24 - 36 seconds    Comment: Performed at [facility] [address], [place], [place] [address]  Sedimentation rate     Status: None   Collection Time: 08/03/19  9:13 AM  Result Value Ref Range   Sed Rate 5 0 - 16 mm/hr    Comment: Performed at [facility] [address] [place], [place] [address]  C-reactive protein     Status: None   Collection Time: 08/03/19  9:13 AM  Result Value Ref Range   CRP <0.5 <1.0 mg/dL    Comment: Performed at [facility] [address], [place], [place] [address]  Hemoglobin A1c     Status: None   Collection Time: 08/03/19  9:13 AM  Result Value Ref Range   Hgb A1c MFr Bld 5.4 4.8 - 5.6 %    Comment: (NOTE) Pre diabetes:          5.7%-6.4% Diabetes:              >6.4% Glycemic control for   <7.0% adults with diabetes    Mean Plasma Glucose 108.28 mg/dL    Comment: Performed at [facility] [address] [place], [place] [address]  Comprehensive metabolic panel     Status: Abnormal   Collection Time: 08/03/19  9:13 AM  Result Value Ref Range   Sodium 140 135 - 145 mmol/L   Potassium 4.1 3.5 - 5.1 mmol/L   Chloride 107 98 - 111 mmol/L   CO2 23 22 - 32 mmol/L   Glucose, Bld 110 (H) 70 - 99 mg/dL   BUN 19 8 - 23 mg/dL   Creatinine, Ser 1.01 0.61 - 1.24 mg/dL   Calcium 9.4 8.9 - 10.3 mg/dL   Total Protein 6.8 6.5 - 8.1 g/dL   Albumin 4.0 3.5 - 5.0 g/dL   AST 19 15 - 41 U/L   ALT 21 0 - 44 U/L   Alkaline Phosphatase 60 38 - 126 U/L   Total Bilirubin 0.9  0.3 - 1.2 mg/dL   GFR calc non Af Amer >60 >60 mL/min   GFR  calc Af Amer >60 >60 mL/min   Anion gap 10 5 - 15    Comment: Performed at [facility] [address], [place], [place] [address]  Prealbumin     Status: None   Collection Time: 08/03/19  9:13 AM  Result Value Ref Range   Prealbumin 29.0 18 - 38 mg/dL    Comment: Performed at [facility] [address] [place], [place] [address]  TSH     Status: None   Collection Time: 08/03/19  9:14 AM  Result Value Ref Range   TSH 2.301 0.350 - 4.500 uIU/mL    Comment: Performed by a 3rd Generation assay with a functional sensitivity of <=0.01 uIU/mL. Performed at [facility], [place] [address] [place], [place] [address]     DG Chest Portable 1 View  Result Date: 08/03/2019 CLINICAL DATA:  Preop for left tibial repair. EXAM: PORTABLE CHEST 1 VIEW COMPARISON:  None. FINDINGS: The heart size and mediastinal contours are within normal limits. Both lungs are clear. The visualized skeletal structures are unremarkable. IMPRESSION: No active disease. Electronically Signed   By: Marijo Conception M.D.   On: 08/03/2019 09:17    CT scan: 07/13/2019, left tib-fib: Large nonunion of left bicondylar tibial plateau fracture.  Atrophic nonunion with significant impaction shortening.  Varus malalignment.  Baseline tricompartmental DJD  Review of Systems  Constitutional: Negative for chills and fever.  Respiratory: Negative for shortness of breath and wheezing.   Cardiovascular: Negative for chest pain and palpitations.  Gastrointestinal: Negative for abdominal pain, nausea and vomiting.  Musculoskeletal: Positive for joint pain (left knee pain ).  Neurological: Negative for tingling and sensory change.   Blood pressure (!) 142/66, pulse 60, temperature 98.1 F (36.7 C), temperature source Oral, resp. rate 17, height 6\' 3"  (1.905 m), weight 102.1 kg, SpO2 99 %. Physical Exam Constitutional:      General: He is not in acute distress.    Appearance: Normal appearance. He is normal weight.   HENT:     Head: Normocephalic and atraumatic.     Mouth/Throat:     Mouth: Mucous membranes are moist.  Eyes:     Extraocular Movements: Extraocular movements intact.  Cardiovascular:     Rate and Rhythm: Normal rate and regular rhythm.  Pulmonary:     Effort: Pulmonary effort is normal. No respiratory distress.     Breath sounds: Normal breath sounds. No wheezing.  Abdominal:     General: Bowel sounds are normal. There is no distension.     Palpations: Abdomen is soft.     Tenderness: There is no abdominal tenderness.  Musculoskeletal:     Cervical back: Normal range of motion.     Comments: Left lower extremity  Tenderness to left proximal tibia Distal motor and sensory functions are grossly intact Clinical deformity Extremity is warm + DP pulse No DCT No pain with passive stretch  Hip and ankle are normal   Neurological:     Mental Status: He is alert.      Assessment/Plan:  74 year old male with left bicondylar tibial plateau fracture nonunion with varus deformity end-stage DJD left knee  -Left bicondylar tibial plateau fracture nonunion  OR for repair of left bicondylar tibial plateau fracture nonunion  Patient will require significant grafting of his defect.  Intraoperative decision as to whether or not we will use  reamed intramedullary aspirate for iliac crest  Nonweightbearing 8 weeks postop  Unrestricted range of motion postop    Risks and benefits reviewed with the patient including continued nonunion, loss of fixation, deep infection, etc.  Patient wishes to proceed  - Pain management:  Titrate accordingly postop  Multimodal analgesia  Patient supposedly very sensitive to pain medications   In his history intake it indicates that he drinks 35 beers a week.  Will need to corroborate this.  As if this is the case he would likely need to be either placed on withdrawal protocol or allowed to have beer at bedside  - ABL anemia/Hemodynamics  Monitor  -  Medical issues   Hypertension   Will restart home medications when deemed appropriate  - DVT/PE prophylaxis:  Lovenox postop  - ID:   Perioperative antibiotics - Metabolic Bone Disease:  Labs are pending  - Activity:  Nonweightbearing left leg for 8 weeks  - FEN/GI prophylaxis/Foley/Lines:  N.p.o.  Advance diet postoperatively   - Impediments to fracture healing:  Established nonunion  ?  Active drinker  - Dispo:  OR for repair of left tibial plateau nonunion    Jari Pigg, PA-C [phone] (C) 08/03/2019, 11:25 AM  [facility] [place] [address] [phone] Domingo Sep (F)

## 2019-08-04 ENCOUNTER — Encounter (HOSPITAL_COMMUNITY): Payer: Self-pay | Admitting: Orthopedic Surgery

## 2019-08-04 DIAGNOSIS — Z20822 Contact with and (suspected) exposure to covid-19: Secondary | ICD-10-CM | POA: Diagnosis present

## 2019-08-04 DIAGNOSIS — Z85828 Personal history of other malignant neoplasm of skin: Secondary | ICD-10-CM | POA: Diagnosis not present

## 2019-08-04 DIAGNOSIS — I10 Essential (primary) hypertension: Secondary | ICD-10-CM | POA: Diagnosis present

## 2019-08-04 DIAGNOSIS — Z7982 Long term (current) use of aspirin: Secondary | ICD-10-CM | POA: Diagnosis not present

## 2019-08-04 DIAGNOSIS — E785 Hyperlipidemia, unspecified: Secondary | ICD-10-CM | POA: Diagnosis present

## 2019-08-04 DIAGNOSIS — Z791 Long term (current) use of non-steroidal anti-inflammatories (NSAID): Secondary | ICD-10-CM | POA: Diagnosis not present

## 2019-08-04 DIAGNOSIS — E8889 Other specified metabolic disorders: Secondary | ICD-10-CM | POA: Diagnosis present

## 2019-08-04 DIAGNOSIS — D62 Acute posthemorrhagic anemia: Secondary | ICD-10-CM | POA: Diagnosis present

## 2019-08-04 DIAGNOSIS — J45909 Unspecified asthma, uncomplicated: Secondary | ICD-10-CM | POA: Diagnosis present

## 2019-08-04 DIAGNOSIS — M1712 Unilateral primary osteoarthritis, left knee: Secondary | ICD-10-CM | POA: Diagnosis present

## 2019-08-04 DIAGNOSIS — X58XXXA Exposure to other specified factors, initial encounter: Secondary | ICD-10-CM | POA: Diagnosis present

## 2019-08-04 DIAGNOSIS — Z79899 Other long term (current) drug therapy: Secondary | ICD-10-CM | POA: Diagnosis not present

## 2019-08-04 DIAGNOSIS — S82142A Displaced bicondylar fracture of left tibia, initial encounter for closed fracture: Secondary | ICD-10-CM | POA: Diagnosis present

## 2019-08-04 DIAGNOSIS — I4510 Unspecified right bundle-branch block: Secondary | ICD-10-CM | POA: Diagnosis present

## 2019-08-04 LAB — COMPREHENSIVE METABOLIC PANEL
ALT: 16 U/L (ref 0–44)
AST: 18 U/L (ref 15–41)
Albumin: 3.2 g/dL — ABNORMAL LOW (ref 3.5–5.0)
Alkaline Phosphatase: 50 U/L (ref 38–126)
Anion gap: 7 (ref 5–15)
BUN: 12 mg/dL (ref 8–23)
CO2: 25 mmol/L (ref 22–32)
Calcium: 8.7 mg/dL — ABNORMAL LOW (ref 8.9–10.3)
Chloride: 107 mmol/L (ref 98–111)
Creatinine, Ser: 0.97 mg/dL (ref 0.61–1.24)
GFR calc Af Amer: >60 mL/min (ref >60)
GFR calc non Af Amer: >60 mL/min (ref >60)
Glucose, Bld: 107 mg/dL — ABNORMAL HIGH (ref 70–99)
Potassium: 3.9 mmol/L (ref 3.5–5.1)
Sodium: 139 mmol/L (ref 135–145)
Total Bilirubin: 0.9 mg/dL (ref 0.3–1.2)
Total Protein: 5.5 g/dL — ABNORMAL LOW (ref 6.5–8.1)

## 2019-08-04 LAB — CALCIUM, IONIZED: Calcium, Ionized, Serum: 5 mg/dL (ref 4.5–5.6)

## 2019-08-04 LAB — CBC
HCT: 32.5 % — ABNORMAL LOW (ref 39.0–52.0)
Hemoglobin: 10.8 g/dL — ABNORMAL LOW (ref 13.0–17.0)
MCH: 30.7 pg (ref 26.0–34.0)
MCHC: 33.2 g/dL (ref 30.0–36.0)
MCV: 92.3 fL (ref 80.0–100.0)
Platelets: 197 10*3/uL (ref 150–400)
RBC: 3.52 MIL/uL — ABNORMAL LOW (ref 4.22–5.81)
RDW: 14.4 % (ref 11.5–15.5)
WBC: 6.8 10*3/uL (ref 4.0–10.5)
nRBC: 0 % (ref 0.0–0.2)

## 2019-08-04 LAB — PTH, INTACT AND CALCIUM
Calcium, Total (PTH): 9.3 mg/dL (ref 8.6–10.2)
PTH: 18 pg/mL (ref 15–65)

## 2019-08-04 LAB — MAGNESIUM: Magnesium: 1.7 mg/dL (ref 1.7–2.4)

## 2019-08-04 LAB — PHOSPHORUS: Phosphorus: 3.9 mg/dL (ref 2.5–4.6)

## 2019-08-04 MED ORDER — MELOXICAM 15 MG PO TABS: 15.0000 mg | ORAL_TABLET | Freq: Every day | ORAL | Status: DC

## 2019-08-04 MED ORDER — ENOXAPARIN SODIUM 40 MG/0.4ML ~~LOC~~ SOLN: 40.0000 mg | SUBCUTANEOUS | 0 refills | Status: DC

## 2019-08-04 MED ORDER — DOCUSATE SODIUM 100 MG PO CAPS: 100.0000 mg | ORAL_CAPSULE | Freq: Two times a day (BID) | ORAL | 0 refills | Status: DC

## 2019-08-04 MED ORDER — HYDROCODONE-ACETAMINOPHEN 5-325 MG PO TABS: 1.0000 | ORAL_TABLET | Freq: Three times a day (TID) | ORAL | 0 refills | Status: DC | PRN

## 2019-08-04 MED ORDER — ACETAMINOPHEN 500 MG PO TABS: 500.0000 mg | ORAL_TABLET | Freq: Three times a day (TID) | ORAL | 0 refills | Status: DC

## 2019-08-04 NOTE — Care Management CC44 (Signed)
Condition Code 44 Documentation Completed  Patient Details  Name: HUZAIFAH HOGGATT MRN: [id number] Date of Birth: 01-30-1946   Condition Code 44 given:  Yes Patient signature on Condition Code 44 notice:  Yes Documentation of 2 MD's agreement:  Yes Code 44 added to claim:  Yes    Ella Bodo, RN 08/04/2019, 12:22 PM

## 2019-08-04 NOTE — Progress Notes (Signed)
Orthopaedic Trauma Service Progress Note  Patient ID: Glenn Watson MRN: [id number] DOB/AGE: [birth year] 74 y.o.  Subjective:  Doing great Ready to go home Pain controlled with tylenol    Review of Systems  Constitutional: Negative for chills and fever.  Respiratory: Negative for shortness of breath and wheezing.   Cardiovascular: Negative for chest pain and palpitations.  Gastrointestinal: Negative for nausea and vomiting.  Neurological: Negative for tingling and sensory change.    Objective:   VITALS:   Vitals:   08/03/19 2000 08/03/19 2306 08/04/19 0405 08/04/19 0717  BP: (!) 154/76 (!) 142/75 (!) 156/80 132/67  Pulse: 69 67 75 69  Resp: 18 18 20 18   Temp: (!) 97.5 F (36.4 C) 97.9 F (36.6 C) 98.6 F (37 C) 99.2 F (37.3 C)  TempSrc: Oral Oral Oral Oral  SpO2: 100% 96% 98% 96%  Weight:      Height:        Estimated body mass index is 28.12 kg/m as calculated from the following:   Height as of this encounter: 6\' 3"  (1.905 m).   Weight as of this encounter: 102.1 kg.   Intake/Output      02/04 0701 - 02/05 0700 02/05 0701 - 02/06 0700   I.V. (mL/kg) 1500 (14.7)    Total Intake(mL/kg) 1500 (14.7)    Blood 50    Total Output 50    Net +1450         Urine Occurrence 2 x 1 x     LABS  Results for orders placed or performed during the hospital encounter of 08/03/19 (from the past 24 hour(s))  CBC     Status: Abnormal   Collection Time: 08/04/19  5:00 AM  Result Value Ref Range   WBC 6.8 4.0 - 10.5 K/uL   RBC 3.52 (L) 4.22 - 5.81 MIL/uL   Hemoglobin 10.8 (L) 13.0 - 17.0 g/dL   HCT 32.5 (L) 39.0 - 52.0 %   MCV 92.3 80.0 - 100.0 fL   MCH 30.7 26.0 - 34.0 pg   MCHC 33.2 30.0 - 36.0 g/dL   RDW 14.4 11.5 - 15.5 %   Platelets 197 150 - 400 K/uL   nRBC 0.0 0.0 - 0.2 %  Comprehensive metabolic panel     Status: Abnormal   Collection Time: 08/04/19  5:00 AM  Result Value Ref Range    Sodium 139 135 - 145 mmol/L   Potassium 3.9 3.5 - 5.1 mmol/L   Chloride 107 98 - 111 mmol/L   CO2 25 22 - 32 mmol/L   Glucose, Bld 107 (H) 70 - 99 mg/dL   BUN 12 8 - 23 mg/dL   Creatinine, Ser 0.97 0.61 - 1.24 mg/dL   Calcium 8.7 (L) 8.9 - 10.3 mg/dL   Total Protein 5.5 (L) 6.5 - 8.1 g/dL   Albumin 3.2 (L) 3.5 - 5.0 g/dL   AST 18 15 - 41 U/L   ALT 16 0 - 44 U/L   Alkaline Phosphatase 50 38 - 126 U/L   Total Bilirubin 0.9 0.3 - 1.2 mg/dL   GFR calc non Af Amer >60 >60 mL/min   GFR calc Af Amer >60 >60 mL/min   Anion gap 7 5 - 15  Magnesium     Status: None   Collection Time: 08/04/19  5:00 AM  Result Value Ref Range   Magnesium 1.7 1.7 - 2.4 mg/dL  Phosphorus     Status: None   Collection Time: 08/04/19  5:00 AM  Result Value Ref Range   Phosphorus 3.9 2.5 - 4.6 mg/dL     PHYSICAL EXAM:   Gen: Sitting on edge of bed working with therapy, appears well and very comfortable Lungs: Unlabored Cardiac: Regular Ext:       Left lower extremity  Prevena is functioning well  Dressing is intact and clean  Moderate swelling to the foot  Distal motor and sensory functions are intact  Extremity is warm  No pain with passive stretching  Compartments are soft  + DP pulse  Assessment/Plan: 1 Day Post-Op   Principal Problem:   Tibial plateau fracture, left, closed, with nonunion, subsequent encounter Active Problems:   Hypertension   Hyperlipidemia   Asthma   Anti-infectives (From admission, onward)   Start     Dose/Rate Route Frequency Ordered Stop   08/03/19 2100  ceFAZolin (ANCEF) IVPB 2g/100 mL premix     2 g 200 mL/hr over 30 Minutes Intravenous Every 6 hours 08/03/19 1946 08/04/19 1459   08/03/19 0900  ceFAZolin (ANCEF) IVPB 2g/100 mL premix     2 g 200 mL/hr over 30 Minutes Intravenous On call to O.R. 08/03/19 0845 08/03/19 1515    .  POD/HD#: 36  74 year old male with left tibial plateau nonunion  -Left tibial plateau nonunion s/p ORIF  Nonweightbearing  for 8 weeks, mobilize with walker or crutches  Unrestricted range of motion left knee and ankle   Hinged knee brace when mobilizing otherwise it can be off  Ice and elevate  Prevena dressing will remain on for 7 days.  We will see the patient back on 08/09/2019 for dressing removal  Recommend convert to compression sock tomorrow, 08/05/2019.  Preferentially thigh-high compression sock   - Pain management:  Well controlled with Tylenol  Will also send home with a short course of Norco  - ABL anemia/Hemodynamics  Stable  - Medical issues   Stable - DVT/PE prophylaxis:  Lovenox x21 days - ID:   Perioperative antibiotics completed  - Metabolic Bone Disease:  Vitamin D levels look great continue with supplementation  - Activity:  Nonweightbearing left leg otherwise activity as tolerated - FEN/GI prophylaxis/Foley/Lines:  Regular diet  - Impediments to fracture healing:  Established nonunion  EtOH use  - Dispo:  Discharge home today  Follow-up in 1 week for dressing change   Jari Pigg, PA-C [phone] (C) 08/04/2019, 11:10 AM  [facility] [place] [address] [phone] Jenetta [phone] (F)   After 6pm on weekdays please call office number to get in touch with on call provider or refer to Amion and look to see who is on call for the Sports Medicine Call Group which is listed under orthopaedics   On Weekends please call office number to get in touch with on call provider or refer to Amion and look to see who is on call for the Sports Medicine Call Group which is listed under orthopaedics

## 2019-08-04 NOTE — Progress Notes (Signed)
Patient is discharged from room 3C02 at this time. Alert and in stable condition. IV site d/c'd and instructions read to patient and spouse with understanding verbalized. Left unit via wheelchair with all belongings at side 

## 2019-08-04 NOTE — Care Management Obs Status (Signed)
[facility] NOTIFICATION   Patient Details  Name: VON LOWNES MRN: [id number] Date of Birth: 1946-06-12   Medicare Observation Status Notification Given:  Yes    Ella Bodo, RN 08/04/2019, 12:22 PM

## 2019-08-04 NOTE — Evaluation (Signed)
Physical Therapy Evaluation and Discharge  Patient Details Name: Glenn Watson MRN: [id number] DOB: August 26, 1945 Today's Date: 08/04/2019   History of Present Illness  Pt is a 74 y/o male who presents s/p ORIF of tibial plateau fracture. Injury initially occurred on May 27, 2019 and sustained a L bicondylar tibial plateau fracture with significant malalignment. PMH signficant for HTN.  Clinical Impression  Patient evaluated by Physical Therapy with no further acute PT needs identified. All education has been completed and the patient has no further questions. We reviewed HEP, safety with mobility, and precautions. Wife is a retired PT and will be available 24 hours at d/c to assist. Overall pt is functioning at a mod I level with the RW for support. See below for any follow-up Physical Therapy or equipment needs. PT is signing off. Thank you for this referral.     Follow Up Recommendations No PT follow up;Supervision for mobility/OOB    Equipment Recommendations  None recommended by PT    Recommendations for Other Services       Precautions / Restrictions Precautions Precautions: Fall Restrictions: Unrestricted ROM of knee Weight Bearing Restrictions: WBAT LLE      Mobility  Bed Mobility Overal bed mobility: Modified Independent                Transfers Overall transfer level: Modified independent Equipment used: Rolling walker (2 wheeled)             General transfer comment: Pt demonstrated good hand placement on seated surface for safety. No assist required to power-up to full stand.   Ambulation/Gait Ambulation/Gait assistance: Modified independent (Device/Increase time) Gait Distance (Feet): 125 Feet Assistive device: Rolling walker (2 wheeled) Gait Pattern/deviations: Step-to pattern;Decreased stride length Gait velocity: Decreased Gait velocity interpretation: <1.8 ft/sec, indicate of risk for recurrent falls General Gait Details: Hop-to pattern 2 NWB  status on LLE. Pt managing well with RW, with safe advancement of walker and turns.   Stairs            Wheelchair Mobility    Modified Rankin (Stroke Patients Only)       Balance Overall balance assessment: Needs assistance Sitting-balance support: Feet unsupported;No upper extremity supported Sitting balance-Leahy Scale: Good     Standing balance support: Bilateral upper extremity supported;During functional activity Standing balance-Leahy Scale: Poor Standing balance comment: Reliant on RW for support due to NWB status on LLE.                              Pertinent Vitals/Pain Pain Assessment: Faces Faces Pain Scale: Hurts a little bit Pain Location: LLE Pain Descriptors / Indicators: Tightness Pain Intervention(s): Monitored during session;Repositioned    Home Living Family/patient expects to be discharged to:: Private residence Living Arrangements: Spouse/significant other Available Help at Discharge: Family;Available 24 hours/day Type of Home: House Home Access: Stairs to enter Entrance Stairs-Rails: None Entrance Stairs-Number of Steps: 1 Home Layout: Two level Home Equipment: Walker - 2 wheels;Bedside commode;Tub bench;Crutches      Prior Function Level of Independence: Independent with assistive device(s)         Comments: Pt has been functioning well prior to surgery - wife is a retired PT. Pt has been using the RW, boosting up the stairs seated, and has been through outpatient PT as well.      Hand Dominance        Extremity/Trunk Assessment   Upper Extremity Assessment Upper Extremity Assessment:  Defer to OT evaluation    Lower Extremity Assessment Lower Extremity Assessment: LLE deficits/detail LLE Deficits / Details: Decreased strength and AROM consistent with above mentioned injury.     Cervical / Trunk Assessment Cervical / Trunk Assessment: Normal  Communication   Communication: No difficulties  Cognition  Arousal/Alertness: Awake/alert Behavior During Therapy: WFL for tasks assessed/performed Overall Cognitive Status: Within Functional Limits for tasks assessed                                        General Comments      Exercises General Exercises - Lower Extremity Quad Sets: 10 reps Long Arc Quad: 15 reps Heel Slides: 10 reps Hip ABduction/ADduction: 10 reps Straight Leg Raises: 10 reps   Assessment/Plan    PT Assessment Patent does not need any further PT services  PT Problem List         PT Treatment Interventions      PT Goals (Current goals can be found in the Care Plan section)  Acute Rehab PT Goals Patient Stated Goal: Home today PT Goal Formulation: All assessment and education complete, DC therapy    Frequency     Barriers to discharge        Co-evaluation               AM-PAC PT "6 Clicks" Mobility  Outcome Measure Help needed turning from your back to your side while in a flat bed without using bedrails?: None Help needed moving from lying on your back to sitting on the side of a flat bed without using bedrails?: None Help needed moving to and from a bed to a chair (including a wheelchair)?: None Help needed standing up from a chair using your arms (e.g., wheelchair or bedside chair)?: None Help needed to walk in hospital room?: None Help needed climbing 3-5 steps with a railing? : A Little 6 Click Score: 23    End of Session Equipment Utilized During Treatment: Gait belt Activity Tolerance: Patient tolerated treatment well;No increased pain Patient left: with nursing/sitter in room;with family/visitor present;Other (comment)(Sitting EOB awaiting OT) Nurse Communication: Mobility status PT Visit Diagnosis: Unsteadiness on feet (R26.81)    Time: [id number] PT Time Calculation (min) (ACUTE ONLY): 37 min   Charges:   PT Evaluation $PT Eval Low Complexity: 1 Low PT Treatments $Gait Training: 8-22 mins        Rolinda Roan, PT, DPT Acute Rehabilitation Services Pager: [phone] Office: [place] 08/04/2019, 1:43 PM

## 2019-08-04 NOTE — Evaluation (Signed)
Occupational Therapy Evaluation and Discharge Patient Details Name: Glenn Watson MRN: [id number] DOB: [birth year] Today's Date: 08/04/2019    History of Present Illness Pt is a 74 y/o male who presents s/p ORIF of tibial plateau fracture. Injury initially occurred on May 27, 2019 and sustained a L bicondylar tibial plateau fracture with significant malalignment. PMH signficant for HTN.   Clinical Impression   Pt is functioning modified independently and able to adhere to NWB on L LE while completing ADL. All DME needs are met. No further OT needs.    Follow Up Recommendations  No OT follow up    Equipment Recommendations  None recommended by OT    Recommendations for Other Services       Precautions / Restrictions Precautions Precautions: Fall Restrictions Weight Bearing Restrictions: Yes LLE Weight Bearing: Non weight bearing      Mobility Bed Mobility Overal bed mobility: Modified Independent                Transfers Overall transfer level: Modified independent Equipment used: Rolling walker (2 wheeled)                 Balance Overall balance assessment: Needs assistance Sitting-balance support: Feet unsupported;No upper extremity supported Sitting balance-Leahy Scale: Good     Standing balance support: Bilateral upper extremity supported;During functional activity Standing balance-Leahy Scale: Poor Standing balance comment: Reliant on RW for support due to NWB status on LLE.                            ADL either performed or assessed with clinical judgement   ADL Overall ADL's : Modified independent                                             Vision Patient Visual Report: No change from baseline       Perception     Praxis      Pertinent Vitals/Pain Pain Assessment: Faces Faces Pain Scale: Hurts a little bit Pain Location: LLE Pain Descriptors / Indicators: Tightness;Sore Pain Intervention(s):  Monitored during session;Premedicated before session;Repositioned     Hand Dominance Right   Extremity/Trunk Assessment Upper Extremity Assessment Upper Extremity Assessment: Overall WFL for tasks assessed   Lower Extremity Assessment Lower Extremity Assessment: Defer to PT evaluation LLE Deficits / Details: Decreased strength and AROM consistent with above mentioned injury.    Cervical / Trunk Assessment Cervical / Trunk Assessment: Normal   Communication Communication Communication: No difficulties   Cognition Arousal/Alertness: Awake/alert Behavior During Therapy: WFL for tasks assessed/performed Overall Cognitive Status: Within Functional Limits for tasks assessed                                     General Comments       Exercises    Shoulder Instructions      Home Living Family/patient expects to be discharged to:: Private residence Living Arrangements: Spouse/significant other Available Help at Discharge: Family;Available 24 hours/day Type of Home: House Home Access: Stairs to enter [employer] of Steps: 1 Entrance Stairs-Rails: None Home Layout: Two level Alternate Level Stairs-Number of Steps: flight   Bathroom Shower/Tub: Teacher, early years/pre: Standard     Home Equipment: Environmental consultant - 2 wheels;Bedside commode;Tub  bench;Crutches          Prior Functioning/Environment Level of Independence: Independent with assistive device(s)        Comments: Pt has been functioning well prior to surgery - wife is a retired PT. Pt has been using the RW, boosting up the stairs seated, and has been through outpatient PT as well.         OT Problem List:        OT Treatment/Interventions:      OT Goals(Current goals can be found in the care plan section) Acute Rehab OT Goals Patient Stated Goal: Home today  OT Frequency:     Barriers to D/C:            Co-evaluation              AM-PAC OT "6 Clicks" Daily  Activity     Outcome Measure Help from another person eating meals?: None Help from another person taking care of personal grooming?: None Help from another person toileting, which includes using toliet, bedpan, or urinal?: None Help from another person bathing (including washing, rinsing, drying)?: None Help from another person to put on and taking off regular upper body clothing?: None Help from another person to put on and taking off regular lower body clothing?: None 6 Click Score: 24   End of Session Equipment Utilized During Treatment: Rolling walker  Activity Tolerance: Patient tolerated treatment well Patient left: in bed;with call bell/phone within reach;with family/visitor present  OT Visit Diagnosis: Other abnormalities of gait and mobility (R26.89)                Time: 6644-0347 OT Time Calculation (min): 13 min Charges:  OT General Charges $OT Visit: 1 Visit OT Evaluation $OT Eval Low Complexity: 1 Low  Nestor Lewandowsky, OTR/L Acute Rehabilitation Services Pager: [phone] Office: [phone]  Malka So 08/04/2019, 2:03 PM

## 2019-08-04 NOTE — Discharge Summary (Signed)
Orthopaedic Trauma Service (OTS) Discharge Summary   Patient ID: Glenn Watson MRN: [id number] DOB/AGE: 1945-11-12 74 y.o.  Admit date: 08/03/2019 Discharge date: 08/04/2019  Admission Diagnoses:   Tibial plateau fracture, left, closed, with nonunion   Hypertension   Hyperlipidemia   Asthma   Discharge Diagnoses:  Principal Problem:   Tibial plateau fracture, left, closed, with nonunion Active Problems:   Hypertension   Hyperlipidemia   Asthma   Past Medical History:  Diagnosis Date  . Anemia   . Asthma    as a child, exercise asthma - none since high school"  . Cancer ([place])    skin cancer - basal, squamous- Mylenoma - stage 2- chest  . Complication of anesthesia    "very sensitive to medications" "Had colonscopy with just Draminine"  . Family history of adverse reaction to anesthesia    daughter- N/V  . Hyperlipidemia   . Hypertension   . PONV (postoperative nausea and vomiting)      Procedures Performed: 08/03/2019-Dr. Marcelino Scot Open reduction internal fixation left bicondylar tibial plateau nonunion Allografting nonunion Anterior compartment fasciotomy left leg  Discharged Condition: good  Hospital Course:   Patient is a 74 year old white male well-known to the orthopedic trauma service.  She sustained a bicondylar tibial plateau fracture back in September patient had too much swelling at the time of his initial consult to allow for safe surgical intervention by the time his swelling had resolved enough he was already several weeks post injury.  His alignment with well-maintained and decision was made to pursue nonoperative treatment.  Patient then decided to follow-up with orthopedic surgeon at home.  Unfortunately he was referred back to our office after he developed significant malalignment and a confirmatory CT noting nonunion.  Patient was taken to the operating room on 08/03/2019 where the procedure noted above was performed.  After surgery he was  transferred to the PACU for recovery from anesthesia and transferred to medical surgical floor for continued observation, pain control and therapies.  Patient did not have any issues whatsoever overnight and his pain was well controlled on Tylenol on postoperative day #1 he worked extraordinarily well with therapy was mobilizing under his own power with really no assistance other than the use of his walker.  He was voiding without difficulty and tolerating regular diet.  In light of this patient requested to be discharged on postoperative day #1.  Patient discharged on postop day #1 in stable condition.  His Prevena was hooked up to the home unit on postoperative day #1.  He will follow-up at the orthopedic office and roughly 6 days for dressing change.  Will be discharged on Lovenox for DVT and PE prophylaxis for the next 21 days.  He will continue on scheduled Tylenol for pain control and will be given a short course of Norco for breakthrough pain  Consults: None  Significant Diagnostic Studies: labs:   Results for TRIPP, BELTRAM "PHIL" (MRN [id number]) as of 08/04/2019 11:30  Ref. Range 08/03/2019 19:29 08/04/2019 05:00  Sodium Latest Ref Range: 135 - 145 mmol/L  139  Potassium Latest Ref Range: 3.5 - 5.1 mmol/L  3.9  Chloride Latest Ref Range: 98 - 111 mmol/L  107  CO2 Latest Ref Range: 22 - 32 mmol/L  25  Glucose Latest Ref Range: 70 - 99 mg/dL  107 (H)  BUN Latest Ref Range: 8 - 23 mg/dL  12  Creatinine Latest Ref Range: 0.61 - 1.24 mg/dL  0.97  Calcium Latest Ref  Range: 8.9 - 10.3 mg/dL  8.7 (L)  Anion gap Latest Ref Range: 5 - 15   7  Phosphorus Latest Ref Range: 2.5 - 4.6 mg/dL  3.9  Magnesium Latest Ref Range: 1.7 - 2.4 mg/dL  1.7  Alkaline Phosphatase Latest Ref Range: 38 - 126 U/L  50  Albumin Latest Ref Range: 3.5 - 5.0 g/dL  3.2 (L)  AST Latest Ref Range: 15 - 41 U/L  18  ALT Latest Ref Range: 0 - 44 U/L  16  Total Protein Latest Ref Range: 6.5 - 8.1 g/dL  5.5 (L)  Total  Bilirubin Latest Ref Range: 0.3 - 1.2 mg/dL  0.9  GFR, Est Non African American Latest Ref Range: >60 mL/min  >60  GFR, Est African American Latest Ref Range: >60 mL/min  >60  WBC Latest Ref Range: 4.0 - 10.5 K/uL  6.8  RBC Latest Ref Range: 4.22 - 5.81 MIL/uL  3.52 (L)  Hemoglobin Latest Ref Range: 13.0 - 17.0 g/dL  10.8 (L)  HCT Latest Ref Range: 39.0 - 52.0 %  32.5 (L)  MCV Latest Ref Range: 80.0 - 100.0 fL  92.3  MCH Latest Ref Range: 26.0 - 34.0 pg  30.7  MCHC Latest Ref Range: 30.0 - 36.0 g/dL  33.2  RDW Latest Ref Range: 11.5 - 15.5 %  14.4  Platelets Latest Ref Range: 150 - 400 K/uL  197  nRBC Latest Ref Range: 0.0 - 0.2 %  0.0   Results for Mol, Kristan D "PHIL" (MRN [id number]) as of 08/04/2019 11:30  Ref. Range 08/03/2019 09:13  CRP Latest Ref Range: <1.0 mg/dL <0.5  Vitamin D, 25-Hydroxy Latest Ref Range: 30 - 100 ng/mL 98.20   Results for Word, Almond D "PHIL" (MRN [id number]) as of 08/04/2019 11:30  Ref. Range 08/03/2019 09:13  Sed Rate Latest Ref Range: 0 - 16 mm/hr 5    Treatments: IV hydration, antibiotics: Ancef, analgesia: acetaminophen and Dilaudid, anticoagulation: LMW heparin, therapies: PT, OT and RN and surgery: as above  Discharge Exam:  Orthopaedic Trauma Service Progress Note   Patient ID: Glenn Watson MRN: [id number] DOB/AGE: Mar 21, 1946 74 y.o.   Subjective:   Doing great Ready to go home Pain controlled with tylenol      Review of Systems  Constitutional: Negative for chills and fever.  Respiratory: Negative for shortness of breath and wheezing.   Cardiovascular: Negative for chest pain and palpitations.  Gastrointestinal: Negative for nausea and vomiting.  Neurological: Negative for tingling and sensory change.      Objective:    VITALS:         Vitals:    08/03/19 2000 08/03/19 2306 08/04/19 0405 08/04/19 0717  BP: (!) 154/76 (!) 142/75 (!) 156/80 132/67  Pulse: 69 67 75 69  Resp: 18 18 20 18   Temp: (!) 97.5 F (36.4 C) 97.9 F  (36.6 C) 98.6 F (37 C) 99.2 F (37.3 C)  TempSrc: Oral Oral Oral Oral  SpO2: 100% 96% 98% 96%  Weight:          Height:              Estimated body mass index is 28.12 kg/m as calculated from the following:   Height as of this encounter: 6\' 3"  (1.905 m).   Weight as of this encounter: 102.1 kg.     Intake/Output      02/04 0701 - 02/05 0700 02/05 0701 - 02/06 0700   I.V. (mL/kg) 1500 (14.7)    Total  Intake(mL/kg) 1500 (14.7)    Blood 50    Total Output 50    Net +1450         Urine Occurrence 2 x 1 x      LABS   Lab Results Last 24 Hours       Results for orders placed or performed during the hospital encounter of 08/03/19 (from the past 24 hour(s))  CBC     Status: Abnormal    Collection Time: 08/04/19  5:00 AM  Result Value Ref Range    WBC 6.8 4.0 - 10.5 K/uL    RBC 3.52 (L) 4.22 - 5.81 MIL/uL    Hemoglobin 10.8 (L) 13.0 - 17.0 g/dL    HCT 32.5 (L) 39.0 - 52.0 %    MCV 92.3 80.0 - 100.0 fL    MCH 30.7 26.0 - 34.0 pg    MCHC 33.2 30.0 - 36.0 g/dL    RDW 14.4 11.5 - 15.5 %    Platelets 197 150 - 400 K/uL    nRBC 0.0 0.0 - 0.2 %  Comprehensive metabolic panel     Status: Abnormal    Collection Time: 08/04/19  5:00 AM  Result Value Ref Range    Sodium 139 135 - 145 mmol/L    Potassium 3.9 3.5 - 5.1 mmol/L    Chloride 107 98 - 111 mmol/L    CO2 25 22 - 32 mmol/L    Glucose, Bld 107 (H) 70 - 99 mg/dL    BUN 12 8 - 23 mg/dL    Creatinine, Ser 0.97 0.61 - 1.24 mg/dL    Calcium 8.7 (L) 8.9 - 10.3 mg/dL    Total Protein 5.5 (L) 6.5 - 8.1 g/dL    Albumin 3.2 (L) 3.5 - 5.0 g/dL    AST 18 15 - 41 U/L    ALT 16 0 - 44 U/L    Alkaline Phosphatase 50 38 - 126 U/L    Total Bilirubin 0.9 0.3 - 1.2 mg/dL    GFR calc non Af Amer >60 >60 mL/min    GFR calc Af Amer >60 >60 mL/min    Anion gap 7 5 - 15  Magnesium     Status: None    Collection Time: 08/04/19  5:00 AM  Result Value Ref Range    Magnesium 1.7 1.7 - 2.4 mg/dL  Phosphorus     Status: None    Collection  Time: 08/04/19  5:00 AM  Result Value Ref Range    Phosphorus 3.9 2.5 - 4.6 mg/dL          PHYSICAL EXAM:    Gen: Sitting on edge of bed working with therapy, appears well and very comfortable Lungs: Unlabored Cardiac: Regular Ext:       Left lower extremity             Prevena is functioning well             Dressing is intact and clean             Moderate swelling to the foot             Distal motor and sensory functions are intact             Extremity is warm             No pain with passive stretching             Compartments are soft             +  DP pulse   Assessment/Plan: 1 Day Post-Op    Principal Problem:   Tibial plateau fracture, left, closed, with nonunion, subsequent encounter Active Problems:   Hypertension   Hyperlipidemia   Asthma                Anti-infectives (From admission, onward)      Start     Dose/Rate Route Frequency Ordered Stop    08/03/19 2100   ceFAZolin (ANCEF) IVPB 2g/100 mL premix     2 g 200 mL/hr over 30 Minutes Intravenous Every 6 hours 08/03/19 1946 08/04/19 1459    08/03/19 0900   ceFAZolin (ANCEF) IVPB 2g/100 mL premix     2 g 200 mL/hr over 30 Minutes Intravenous On call to O.R. 08/03/19 0845 08/03/19 1515       .   POD/HD#: 28   74 year old male with left tibial plateau nonunion   -Left tibial plateau nonunion s/p ORIF             Nonweightbearing for 8 weeks, mobilize with walker or crutches             Unrestricted range of motion left knee and ankle                         Hinged knee brace when mobilizing otherwise it can be off             Ice and elevate             Prevena dressing will remain on for 7 days.  We will see the patient back on 08/09/2019 for dressing removal             Recommend convert to compression sock tomorrow, 08/05/2019.  Preferentially thigh-high compression sock              - Pain management:             Well controlled with Tylenol             Will also send home with a short course  of Norco   - ABL anemia/Hemodynamics             Stable   - Medical issues              Stable - DVT/PE prophylaxis:             Lovenox x21 days - ID:              Perioperative antibiotics completed   - Metabolic Bone Disease:             Vitamin D levels look great continue with supplementation   - Activity:             Nonweightbearing left leg otherwise activity as tolerated - FEN/GI prophylaxis/Foley/Lines:             Regular diet   - Impediments to fracture healing:             Established nonunion             EtOH use   - Dispo:             Discharge home today             Follow-up in 1 week for dressing change   Disposition: Discharge disposition: 01-Home or Self Care       Discharge Instructions    Call MD / Call 911  Complete by: As directed    If you experience chest pain or shortness of breath, CALL 911 and be transported to the hospital emergency room.  If you develope a fever above 101 F, pus (white drainage) or increased drainage or redness at the wound, or calf pain, call your surgeon's office.   Constipation Prevention   Complete by: As directed    Drink plenty of fluids.  Prune juice may be helpful.  You may use a stool softener, such as Colace (over the counter) 100 mg twice a day.  Use MiraLax (over the counter) for constipation as needed.   Diet general   Complete by: As directed    Discharge instructions   Complete by: As directed    Orthopaedic Trauma Service Discharge Instructions   General Discharge Instructions  Orthopaedic Injuries:  Left tibial plateau nonunion treated with open reduction internal fixation using plate and screws  WEIGHT BEARING STATUS: Nonweightbearing left leg, use walker or crutches to mobilize  RANGE OF MOTION/ACTIVITY: Unrestricted range of motion left knee and ankle.  Wear hinged knee brace when mobilizing otherwise hinged knee brace can be off when resting.  Do not let knee rest in flexion.  Keep pillow  under ankle to help knee rest in full extension.  It is okay to be aggressive with range of motion.  Bone health: Continue taking your vitamin D supplementation as you are  Wound Care: No formal wound care for now.  Keep Prevena on incision.  Ensure that the battery is fully charged if going out of the house for the reason.  Would recommend plugging in the unit at night.  Convert to compression sock from the Ace wrap on 08/05/2019.  DVT/PE prophylaxis: Lovenox 40 mg subcutaneous injection daily x 21 days  Diet: as you were eating previously.  Can use over the counter stool softeners and bowel preparations, such as Miralax, to help with bowel movements.  Narcotics can be constipating.  Be sure to drink plenty of fluids  PAIN MEDICATION USE AND EXPECTATIONS  You have likely been given narcotic medications to help control your pain.  After a traumatic event that results in an fracture (broken bone) with or without surgery, it is ok to use narcotic pain medications to help control one's pain.  We understand that everyone responds to pain differently and each individual patient will be evaluated on a regular basis for the continued need for narcotic medications. Ideally, narcotic medication use should last no more than 6-8 weeks (coinciding with fracture healing).   As a patient it is your responsibility as well to monitor narcotic medication use and report the amount and frequency you use these medications when you come to your office visit.   We would also advise that if you are using narcotic medications, you should take a dose prior to therapy to maximize you participation.  IF YOU ARE ON NARCOTIC MEDICATIONS IT IS NOT PERMISSIBLE TO OPERATE A MOTOR VEHICLE (MOTORCYCLE/CAR/TRUCK/MOPED) OR HEAVY MACHINERY DO NOT MIX NARCOTICS WITH OTHER CNS (CENTRAL NERVOUS SYSTEM) DEPRESSANTS SUCH AS ALCOHOL   STOP SMOKING OR USING NICOTINE PRODUCTS!!!!  As discussed nicotine severely impairs your body's ability to  heal surgical and traumatic wounds but also impairs bone healing.  Wounds and bone heal by forming microscopic blood vessels (angiogenesis) and nicotine is a vasoconstrictor (essentially, shrinks blood vessels).  Therefore, if vasoconstriction occurs to these microscopic blood vessels they essentially disappear and are unable to deliver necessary nutrients to the healing tissue.  This  is one modifiable factor that you can do to dramatically increase your chances of healing your injury.    (This means no smoking, no nicotine gum, patches, etc)  DO NOT USE NONSTEROIDAL ANTI-INFLAMMATORY DRUGS (NSAID'S)  Using products such as Advil (ibuprofen), Aleve (naproxen), Motrin (ibuprofen) for additional pain control during fracture healing can delay and/or prevent the healing response.  If you would like to take over the counter (OTC) medication, Tylenol (acetaminophen) is ok.  However, some narcotic medications that are given for pain control contain acetaminophen as well. Therefore, you should not exceed more than 4000 mg of tylenol in a day if you do not have liver disease.  Also note that there are may OTC medicines, such as cold medicines and allergy medicines that my contain tylenol as well.  If you have any questions about medications and/or interactions please ask your doctor/PA or your pharmacist.      ICE AND ELEVATE INJURED/OPERATIVE EXTREMITY  Using ice and elevating the injured extremity above your heart can help with swelling and pain control.  Icing in a pulsatile fashion, such as 20 minutes on and 20 minutes off, can be followed.    Do not place ice directly on skin. Make sure there is a barrier between to skin and the ice pack.    Using frozen items such as frozen peas works well as the conform nicely to the are that needs to be iced.  USE AN ACE WRAP OR TED HOSE FOR SWELLING CONTROL  In addition to icing and elevation, Ace wraps or TED hose are used to help limit and resolve swelling.  It is  recommended to use Ace wraps or TED hose until you are informed to stop.    When using Ace Wraps start the wrapping distally (farthest away from the body) and wrap proximally (closer to the body)   Example: If you had surgery on your leg or thing and you do not have a splint on, start the ace wrap at the toes and work your way up to the thigh        If you had surgery on your upper extremity and do not have a splint on, start the ace wrap at your fingers and work your way up to the upper arm  IF YOU ARE IN A SPLINT OR CAST DO NOT Boon   If your splint gets wet for any reason please contact the office immediately. You may shower in your splint or cast as long as you keep it dry.  This can be done by wrapping in a cast cover or garbage back (or similar)  Do Not stick any thing down your splint or cast such as pencils, money, or hangers to try and scratch yourself with.  If you feel itchy take benadryl as prescribed on the bottle for itching  IF YOU ARE IN A CAM BOOT (BLACK BOOT)  You may remove boot periodically. Perform daily dressing changes as noted below.  Wash the liner of the boot regularly and wear a sock when wearing the boot. It is recommended that you sleep in the boot until told otherwise    Call office for the following: Temperature greater than 101F Persistent nausea and vomiting Severe uncontrolled pain Redness, tenderness, or signs of infection (pain, swelling, redness, odor or green/yellow discharge around the site) Difficulty breathing, headache or visual disturbances Hives Persistent dizziness or light-headedness Extreme fatigue Any other questions or concerns you may have after discharge  In an emergency, call 911 or go to an Emergency Department at a nearby hospital    [place]: [phone]   VISIT OUR WEBSITE FOR ADDITIONAL INFORMATION: [email or web address]   Do not put a pillow under the knee. Place it under  the heel.   Complete by: As directed    Driving restrictions   Complete by: As directed    No driving   Increase activity slowly as tolerated   Complete by: As directed      Allergies as of 08/04/2019   No Known Allergies     Medication List    TAKE these medications   acetaminophen 500 MG tablet Commonly known as: TYLENOL Take 1 tablet (500 mg total) by mouth every 8 (eight) hours.   amLODipine-benazepril 5-10 MG capsule Commonly known as: LOTREL Take 1 capsule by mouth daily before breakfast.   aspirin 81 MG tablet Take 81 mg by mouth daily after lunch.   CALCIUM PO Take 1,000 mg by mouth daily after lunch.   docusate sodium 100 MG capsule Commonly known as: COLACE Take 1 capsule (100 mg total) by mouth 2 (two) times daily.   enoxaparin 40 MG/0.4ML injection Commonly known as: LOVENOX Inject 0.4 mLs (40 mg total) into the skin daily for 21 days. Start taking on: August 05, 2019   Fish Oil 1200 MG Caps Take 1,200 mg by mouth daily before breakfast.   HYDROcodone-acetaminophen 5-325 MG tablet Commonly known as: NORCO/VICODIN Take 1-2 tablets by mouth every 8 (eight) hours as needed for moderate pain or severe pain.   Iron 325 (65 Fe) MG Tabs Take 325 mg by mouth daily after lunch.   meloxicam 15 MG tablet Commonly known as: MOBIC Take 1 tablet (15 mg total) by mouth daily after lunch. Would recommend stopping this medication if you can while we await healing of your tibia.  NSAIDs have been shown to decrease bone healing What changed: additional instructions   Milk Thistle 1000 MG Caps Take 1,000 mg by mouth daily after lunch.   multivitamin tablet Take 1 tablet by mouth daily after lunch.   SAW PALMETTO PO Take 1,200 mg by mouth daily after lunch.   tamsulosin 0.4 MG Caps capsule Commonly known as: FLOMAX Take 0.4 mg by mouth daily after lunch.   Vitamin D 50 MCG (2000 UT) Caps Take 4,000 Units by mouth daily before breakfast.      Follow-up  Information    Altamese New Salisbury, MD. Schedule an appointment as soon as possible for a visit on 08/09/2019.   Specialty: Orthopedic Surgery Contact information: [place] [address] [phone]           Discharge Instructions and Plan:  Patient sustained a significant injury to his left lower extremity.  Further complicated by the fact that he has an established nonunion.  We were able to achieve excellent fixation and graft the large defect with allografting.  He will be nonweightbearing for the next 8 weeks with unrestricted range of motion.  He is to be in his hinged knee brace when mobilizing but can remove his hinged knee brace when at rest.  He has a Prevena incisional VAC in place and this will remain in place for a week.  We will do a dressing change in the office next Wednesday.  I would recommend him converting over to a compression sock tomorrow and remove his Ace wrap.  Continue with aggressive ice and elevation.  He  will remain on Lovenox for 21 days for DVT and PE prophylaxis  Continue with pain control with Tylenol and breakthrough Norco  We will arrange for low intensity pulsed ultrasound bone growth stimulator for adjuvant healing  Patient discharged in stable condition on 08/04/2019  Patient aware of what signs and symptoms would necessitate phone call or visit to the office and or visit to the emergency department  Comprehensive discharge instructions included with the patient's discharge paperwork  Signed:  Jari Pigg, PA-C [phone] (C) 08/04/2019, 11:26 AM  [facility] [place] [address] [phone] Domingo Sep (F)

## 2019-08-07 ENCOUNTER — Telehealth: Payer: Self-pay | Admitting: Physical Therapy

## 2019-08-07 ENCOUNTER — Ambulatory Visit: Payer: PPO | Admitting: Physical Therapy

## 2019-08-07 NOTE — Telephone Encounter (Signed)
Called Dr. Carlean Jews office and spoke to [place]. She took a message to find out when pt is to return to PT and to send a new order and OP note when he is ready for PT. Updated pt by email.   Everlean Alstrom. Graylon Good, PT, DPT 08/07/19, 10:26 AM

## 2019-08-09 DIAGNOSIS — S82102K Unspecified fracture of upper end of left tibia, subsequent encounter for closed fracture with nonunion: Secondary | ICD-10-CM | POA: Diagnosis not present

## 2019-08-09 DIAGNOSIS — S82142K Displaced bicondylar fracture of left tibia, subsequent encounter for closed fracture with nonunion: Secondary | ICD-10-CM | POA: Diagnosis not present

## 2019-08-10 ENCOUNTER — Ambulatory Visit: Payer: PPO | Admitting: Physical Therapy

## 2019-08-14 ENCOUNTER — Encounter: Payer: Self-pay | Admitting: Physical Therapy

## 2019-08-14 ENCOUNTER — Ambulatory Visit: Payer: PPO | Attending: Orthopedic Surgery | Admitting: Physical Therapy

## 2019-08-14 ENCOUNTER — Other Ambulatory Visit: Payer: Self-pay

## 2019-08-14 DIAGNOSIS — M25562 Pain in left knee: Secondary | ICD-10-CM | POA: Insufficient documentation

## 2019-08-14 DIAGNOSIS — R2681 Unsteadiness on feet: Secondary | ICD-10-CM | POA: Diagnosis not present

## 2019-08-14 DIAGNOSIS — M6281 Muscle weakness (generalized): Secondary | ICD-10-CM | POA: Diagnosis not present

## 2019-08-14 NOTE — Therapy (Signed)
[facility] [address], [place], [address] Phone: [phone]   Fax:  [phone]  Physical Therapy Treatment / Re-Certification / Re-Evaluation Reporting period: 07/03/2019 - 08/14/2019 Reason for Re-Evaluation: change in medical status due to surgery performed under general anesthesia 08/03/2019 and overnight stay in hospital.   Patient Details  Name: Glenn Watson MRN: [id number] Date of Birth: [birth year] Referring Provider (PT): Altamese [place], MD   Encounter Date: 08/14/2019  PT End of Session - 08/14/19 1321    Visit Number  28    Number of Visits  52    Date for PT Re-Evaluation  11/06/19    Authorization Type  [employer] reporting period from 07/03/2019    Authorization - Visit Number  8    Authorization - Number of Visits  10    PT Start Time  1115    PT Stop Time  1200    PT Time Calculation (min)  45 min    Activity Tolerance  Patient tolerated treatment well    Behavior During Therapy  [facility] for tasks assessed/performed       Past Medical History:  Diagnosis Date  . Anemia   . Asthma    as a child, exercise asthma - none since high school"  . Cancer ([place])    skin cancer - basal, squamous- Mylenoma - stage 2- chest  . Complication of anesthesia    "very sensitive to medications" "Had colonscopy with just Draminine"  . Family history of adverse reaction to anesthesia    daughter- N/V  . Hyperlipidemia   . Hypertension   . PONV (postoperative nausea and vomiting)     Past Surgical History:  Procedure Laterality Date  . APPENDECTOMY  2011   Dr. Bary Castilla  . COLONOSCOPY WITH PROPOFOL N/A 02/10/2017   Procedure: COLONOSCOPY WITH PROPOFOL;  Surgeon: Christene Lye, MD;  Location: [facility] ENDOSCOPY;  Service: Endoscopy;  Laterality: N/A;  . ORIF TIBIA PLATEAU Left 08/03/2019   Procedure: OPEN REDUCTION INTERNAL FIXATION (ORIF) TIBIAL PLATEAU;  Surgeon: Altamese , MD;  Location:  [place];  Service: Orthopedics;  Laterality: Left;    There were no vitals filed for this visit.  Subjective Assessment - 08/14/19 1120    Subjective  Patient reports he had an ORIF on his left tibial plateau 08/03/2019 including placing some lab-created bone material in the tibial plateau and put a plate in there and a several screws. The incision is pretty long from the lateral joint line to the mid anterior shin. The dressing has been changed a couple of times. He is non weight bearing 8 weeks. Encouraged aggressive ROM. May get stitches out on Wednesday depending on how the the dressing changes goes today. No concerns about infection. Patient states he has done some flexion and extension 3x10 each and is sore today from it.    Patient is accompained by:  Family member    Pertinent History  L ankle surgery; hypertension; history of falling; L knee pain.    Limitations  Lifting;Standing;Walking;House hold activities    How long can you stand comfortably?  can do dishes    How long can you walk comfortably?  can only go a small ways inside the home or from car to clinic before he needs to rest due to fatigue    Diagnostic tests  CT scan L knee report 07/18/2019 "IMPRESSION: 1. 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 2. Partial healing  of the impacted fracture of the proximal fibula. 3. Moderate knee joint effusion."    Patient Stated Goals  Able to walk without AD    Currently in Pain?  Yes    Pain Score  1     Pain Location  Knee    Pain Orientation  Left    Pain Descriptors / Indicators  Sore    Pain Type  Surgical pain    Pain Onset  1 to 4 weeks ago    Pain Frequency  Intermittent    Aggravating Factors   moving/using L knee a lot    Pain Relieving Factors  rest    Effect of Pain on Daily Activities  decreased abilitiy to complete any activity that requires weight bearing, bending/extending L knee, lifiting L LE, including basic ADLs, IADLs, household and  community mobility, social activities, hobbies, running, stairs, hiking, etc.          [facility] PT Assessment - 08/14/19 0001      Assessment   Medical Diagnosis  Lt Tibial Plateau nonunion, s/p repair    ORIF completed 07/03/2019   Referring Provider (PT)  Altamese [place], MD    Onset Date/Surgical Date  03/27/19   fall 03/27/2019; sx 08/03/2019   Hand Dominance  Right      Precautions   Precautions  Other (comment)    Required Braces or Orthoses  Other Brace/Splint   at all times except sleeping   Other Brace/Splint  long hinged knee brace in open position at all times except sleeping      Restrictions   Weight Bearing Restrictions  Yes    LLE Weight Bearing  Non weight bearing   for 8 weeks post sx 07/03/2019     [place] residence    Living Arrangements  Spouse/significant other    Available Help at Discharge  Family    Type of [place] to enter;Level entry    [place]  Two level    Alternate Level Stairs-Number of Steps  --   13   Alternate Level Stairs-Rails  Right    Home Equipment  Walker - 2 wheels;Crutches      Prior Function   Level of Independence  Independent    Vocation  Retired    Leisure  [employer], cooking, reading      Cognition   Overall Cognitive Status  Within Functional Limits for tasks assessed      Observation/Other Assessments   Observations  See note from 08/14/2019 for latest objective data    Focus on Therapeutic Outcomes (FOTO)   FOTO = 40 (08/14/2019)        OBJECTIVE  MUSCULOSKELETAL: Tremor: Absent Bulk: significant L LE swelling mostly around knee. Using support hose (20-30)  Posture No gross abnormalities noted in standing or seated posture.   Gait Ambulated with rolling walker and none weight bearing at L LE  Transfers:  - sit <> stand from chair and elevated plinth with mod I using RW, NWB L LE  Bed mobility:   - rolling and supine <> sit with mod I  (additional time and planning to maintain L LE NWB).    AROM Lumbar/Hip AROM: WFL for basic mobility except slight limitation in hip L  extension.  Knee R/L Flexion: 130/96*              - L knee PROM 100 with moderated discomfort.* (wiht brace)             -  R knee PROM WNL Extension: 0/-10* *indicates pain   Ankle: R ankle WFL and L ankle limited due to pat injury.   Strength R/L (L LE = deferred manually fully resisted motions at knee, ankle, and hip rotation due to post-fx precautions)  4+/4 Hip flexion 5/     R Hip external rotation; L hip deferred due to precautions 5/     R Hip internal rotation; L hip deferred due to precautions 3+/2 Hip abduction (sidelying) 5/4* Hip extension 5/2-*   L lacking 30 degrees (no pressure applied) 5/3    Knee flexion (L resistance limited due to recent sx).  5/3    Ankle Dorsiflexion; L force restricted due to recent sx) 5/3    Ankle Plantarflexion; (L force restricted due to recent sx) 5/3    Ankle eversion (L force restricted due to recent sx) 4+/4+ Great toe exension *indicates pain Unable to lift heel off mat for L active SLR  Objective measurements completed on examination: See above findings.    TREATMENT:   Brace applied entire session  Therapeutic exercise:to centralize symptoms and improve ROM, strength, muscular endurance, and activity tolerance required for successful completion of functional activities.  - sidelying L hip abduction 3x10  - sidelying L hip clamshell 3x10-12 - supine heel slides with AAROM L knee flexion using strap 2x10 with 5 second hold.  - supine L quad set, 5 second hold, x 5 with heel elevated, x 10 with towel roll behind knee.  - attempted active L SLR but unable - Education on diagnosis, prognosis, POC, anatomy and physiology of current condition.  - Education on HEP including handout    HOME EXERCISE PROGRAM Access Code: [email or web address]  URL: [email or web address]  Date:  08/14/2019  Prepared by: Rosita Kea   Exercises Sidelying Hip Abduction - 3 sets - 10-12 reps - 1x daily - 7x weekly Clamshell with Resistance - 2-3 sets - 10 reps - 1x daily - 7x weekly Supine Heel Slide with Strap - 2 sets - 10-20 reps - 5 seconds hold - 2x daily - 7x weekly Supine Quad Set on Towel Roll - 2 sets - 20 reps - 5 seconds hold - 2x daily    PT Education - 08/14/19 1321    Education Details  exercise purpose/form. self management techniques    Person(s) Educated  Patient    Methods  Explanation;Demonstration;Tactile cues;Verbal cues;Handout    Comprehension  Verbalized understanding;Returned demonstration;Verbal cues required;Tactile cues required;Need further instruction       PT Short Term Goals - 08/14/19 1328      PT SHORT TERM GOAL #1   Title  Be independent with initial home exercise program for self-management of symptoms.    Baseline  initial HEP provided at IE (04/18/2019);    Time  2    Period  Weeks    Status  Achieved    Target Date  05/02/19        PT Long Term Goals - 08/14/19 1329      PT LONG TERM GOAL #1   Title  Be independent with a long-term home exercise program for self-management of symptoms.    Baseline  initial HEP provided at IE (04/18/2019); continues to participate in currently appropriate HEP (05/18/2019; 06/29/2019); updated HEP as appropriate for post-op rehab (08/14/2019);    Time  12    Period  Weeks    Status  Partially Met    Target Date  11/06/19      PT LONG TERM  GOAL #2   Title  Demonstrate improved FOTO score by 10 units to demonstrate improvement in overall condition and self-reported functional ability.    Baseline  44 (04/18/2019); 52 (05/18/2019); 52 (06/29/2019); FOTO = 40 (08/14/2019);    Time  12    Period  Weeks    Status  On-going    Target Date  11/06/19      PT LONG TERM GOAL #3   Title  Complete community, work and/or recreational activities without limitation due to current condition.    Baseline   Difficulites with walking, standing, shopping, driving, cooking and bathing, currently NWB L LE (04/18/2019); continues with similar difficulties but is now PWB25% and is able to drive, cross legs to don shoes (05/18/2019); improving but continues to have limitations and cannot perform activities that require single leg stance full weight bearing (06/29/2019); currently limited similarly to intial eval due to recent surgery 08/03/2019 to repair non-union fracture (08/14/2019);    Time  12    Period  Weeks    Status  On-going    Target Date  11/06/19      PT LONG TERM GOAL #4   Title  Increase L knee flexion AROM to 140 degrees to be able to complete ADLs without limitation of knee flexion    Baseline  PROM 95 degrees of flexion with pain at end range and AROM 80 degrees(04/18/2019); AAROM 110 with end range pain (05/18/2019); PROM 0-126, AROM ext -25, AAROM flex 115 (06/29/2019); PROM lacking 10 degrees extension, up to 100 degrees flexion (with brace on) (08/14/2019);    Time  12    Period  Weeks    Status  On-going    Target Date  11/06/19      PT LONG TERM GOAL #5   Title  Pt will increase LE strength to equal or greater than 4+/5 MMT in order to demonstrate improvement in strength and function.    Baseline  See objectives on IE(04/18/2019); still 2/10 L knee extension (lacking last 22 degrees against gravity) (05/18/2019); continues to lack terminal knee extension in open change position, improved to at least 4/5 with discomfort within available range (06/29/2019); has decreased in since last session due to recent surgery and away from PT 2 weeks (08/14/2019);    Time  12    Period  Weeks    Status  On-going    Target Date  11/06/19            Plan - 08/14/19 1341    Clinical Impression Statement  Patient has attended 28 physical therapy treatment sessions this episode of care. He made good progress at first, then was found to have suffered non-union of his left tibial plateau fracture,  resulting in surgical intervention 08/03/2019 with ORIF and artificial bone grafting and has now regressed some in his impairments and functional limitations due to surgery. Pateint is returning to physical therapy post op due to his ongoing impairments and functional defictis preventing him from usual activities. Patient is a 75 y.o. male who presents to outpatient physical therapy with a referral for medical diagnosis of Lt Tibial Plateau nonunion, s/p repair. This patient presents with the sign and symptoms consistent with knee pain, stiffness, and weakness s/p ORIF at left tibial plateau. Patient demonstrated deficits such as deficits in muscle performance (strength/power/endurance), ROM, joint stiffness, tissue integrity, mobility, pain, weight bearing restrictions. These deficits limit the patient ability to perform things such as ADLs, IADLs, social participation, caring for others, engaging in  hobbies (house working, cooking, and driving), and impairs their quality of life. The pt will benefit from continued skilled PT services to address deficits and return to PLOF and independence, recreational activity and work    Personal Factors and Comorbidities  Comorbidity 2;Age    Comorbidities  L ankle surgery; hypertension; history of falling; L knee pain.    Examination-Activity Limitations  Bathing;Bed Mobility;Locomotion Level;Stairs;Stand;Hygiene/Grooming;Bend;Caring for Others;Sleep;Lift;Transfers;Toileting;Squat;Carry    Examination-Participation Restrictions  Yard Work;Driving;Meal Prep;Laundry;Shop;Cleaning    Stability/Clinical Decision Making  Evolving/Moderate complexity    Rehab Potential  Good    PT Frequency  2x / week    PT Duration  12 weeks    PT Treatment/Interventions  ADLs/Self Care Home Management;Cryotherapy;Moist Heat;Gait training;Stair training;Functional mobility training;Therapeutic activities;Therapeutic exercise;Balance training;Neuromuscular re-education;Manual techniques;Joint  Manipulations;Passive range of motion;Dry needling;DME Instruction    PT Next Visit Plan  Strengthening, ROM and muscle endurance, gait training when appropriate    PT Home Exercise Plan  Medbridge: [email or web address]    Consulted and Agree with Plan of Care  Patient       Patient will benefit from skilled therapeutic intervention in order to improve the following deficits and impairments:  Abnormal gait, Decreased endurance, Decreased activity tolerance, Decreased range of motion, Decreased strength, Pain, Impaired flexibility, Difficulty walking, Decreased balance, Impaired perceived functional ability, Decreased knowledge of use of DME, Decreased mobility  Visit Diagnosis: Acute pain of left knee  Unsteadiness on feet  Muscle weakness (generalized)     Problem List Patient Active Problem List   Diagnosis Date Noted  . Hypertension   . Hyperlipidemia   . Asthma   . Tibial plateau fracture, left, closed, with nonunion, subsequent encounter 08/03/2019    Everlean Alstrom. Graylon Good, PT, DPT 08/14/19, 1:42 PM  [facility] [address] [place], [address] Phone: [phone]   Fax:  [phone]  Name: KEONTE DAUBENSPECK MRN: [id number] Date of Birth: 08-May-1946

## 2019-08-17 ENCOUNTER — Ambulatory Visit: Payer: PPO | Admitting: Physical Therapy

## 2019-08-17 NOTE — Op Note (Signed)
NAME: Glenn Watson, Glenn Watson MEDICAL RECORD [id number] ACCOUNT [id number] DATE OF BIRTH:10/21/1945 FACILITY: [facility] LOCATION: [facility] PHYSICIAN:Keelen Quevedo H. Sontee Desena, MD  OPERATIVE REPORT  DATE OF PROCEDURE:  08/03/2019  PREOPERATIVE DIAGNOSES:   1.  Left bicondylar tibial plateau fracture. 2.  Proximal tibia nonunion.  POSTOPERATIVE DIAGNOSES:   1.  Left bicondylar tibial plateau fracture. 2.  Proximal tibia nonunion.  PROCEDURES: 1.  Open reduction internal fixation of displaced bicondylar tibial plateau. 2.  Repair of cavitary nonunion left proximal tibia using Infuse and allograft. 3.  Anterior compartment fasciotomy.  SURGEON:  Altamese [place], MD  ASSISTANT:  Ainsley Spinner, PA-C  ANESTHESIA:  General.  COMPLICATIONS:  None.  TOTAL TOURNIQUET TIME:  120 minutes.  DISPOSITION:  To PACU.  CONDITION:  Stable.  BRIEF SUMMARY AND INDICATIONS FOR PROCEDURE:  The patient is a very pleasant 74 year old male who sustained a displaced bicondylar tibial plateau fracture.  He was treated nonsurgically by Dr. Earnestine Leys through [facility] as well as myself as a  consultant in his treatment.  The patient's nonoperative course was complicated by severe edema of the extremity that precluded surgical fixation for 6 weeks.  Fortunately, his alignment was outstanding in spite of the bicondylar involvement.   Unfortunately, he went on to develop nonunion through the proximal aspect of the tibia underneath the plateau as well as loss of alignment as the plateau shifted relative to the shaft.  I did discuss this with multiple colleagues and the patient was not  a candidate to proceed directly to total knee arthroplasty and instead required reconstruction of his plateau to realign it appropriately with the rest of the shaft as well as direct treatment of his nonunion underneath the plateau to restore bone stock  and obtain healing.  We did discuss the need for plate osteosynthesis of the plateau and also  for bone grafting of the rather large cavitary defect either with iliac crest, reamed intramedullary aspirate, or possibly Infuse and allograft depending on the  circumstances.  We discussed the risk of persistent nonunion, the need for further surgery including total knee arthroplasty, compartment syndrome, severe edema, nerve injury, vessel injury, and multiple others, and after acknowledgement of these risks,  he strongly wished to proceed.  BRIEF SUMMARY OF PROCEDURE:  The patient was taken to the operating room where general anesthesia was induced.  His left lower extremity was prepped and draped in the usual sterile fashion.  We did place a tourniquet about the thigh.  The timeout was  held in a standard anterolateral approach made to the proximal tibia.  Hemostasis was obtained with electrocautery.  As we got to the deep tissues the oozing and some engorged veins suggested that elevation of the tourniquet at that time would be most  prudent.  Consequently, the leg wound was packed, the leg elevated, and exsanguinated with an Esmarch bandage followed by inflation of the tourniquet, which would be up for 120 minutes.  I continued the dissection down to the rim and was able to identify  the fracture site laterally.  I was also able to continue around the anterior aspect of the tibia and enter into the cavitary defect just at the top of the metaphysis.  The bicondylar portion, once the arthrotomy was made submeniscally, could be seen to  be largely intact with the weightbearing surface being in a depressed position and the posterior cortex having little articular surface.  I was able to manipulate the lateral plateau with a K-wire, secure this to the medial  side, and use this to  joystick the bicondylar portion of the plateau into appropriate position and orientation with respect to the shaft.  This was then secured again provisionally with K-wires.  At that point, I entered the cavitary defect produced  by the nonunion.  It was  substantial.  Using a curette I removed fibrinous material and the pseudoarthrosis all the way around to the medial side with a curved curette.  This was evacuated and scraped back to a healthy bleeding bone.  I then manipulated the bicondylar plateau  such that I could try to compress portions of this down into an opposed position with the shaft.  I was careful to place the plate onto the proximal fragment with the plate anterior to the shaft as the proximal fragment was in extension and then as I  brought this down to the shaft it restored appropriate alignment with regard to the extensor mechanism.  This was secured provisionally.  Having thus repaired the bicondylar plateau, I then commenced with the nonunion and made a series of Infuse and  allograft burritos and was able to place these deep and impact them within the nonunion site.  We used nearly 80 cc of chips as well as the large Infuse sponges.  PA Ainsley Spinner was present and assisting throughout.  Once this was packed securely into  position we then tightened the fixation distally and the proximal fixation was with 7 screws using a combination of standard, which were placed first and locked screws.  Last I turned my attention to the distal aspect of the leg, took the long Metzenbaum  scissors spreading superficial and deep to the anterior compartment fascia, and then releasing it along its course to reduce the likelihood of postoperative compartment syndrome.  Standard layer closure was performed.  Tourniquet was deflated.  The  patient was awakened and taken to the PACU in stable condition.  PROGNOSIS:  The patient had significant cavitary defect.  Fortunately, we were able to repair the bicondylar plateau restoring appropriate alignment of the plateau as well as the extensor mechanism and repairing the nonunion as well.  We will allow for  unrestricted range of motion immediately and nonweightbearing for the next 6  weeks with graduated weightbearing thereafter.  Will be on DVT prophylaxis with aggressive edema control given his propensity for this in the operative leg.  He will be on  formal pharmacologic DVT prophylaxis with Lovenox.  CN/NUANCE  D:08/16/2019 T:08/17/2019 JOB:[id number]

## 2019-08-21 ENCOUNTER — Other Ambulatory Visit: Payer: Self-pay

## 2019-08-21 ENCOUNTER — Encounter: Payer: Self-pay | Admitting: Physical Therapy

## 2019-08-21 ENCOUNTER — Ambulatory Visit: Payer: PPO | Admitting: Physical Therapy

## 2019-08-21 DIAGNOSIS — M25562 Pain in left knee: Secondary | ICD-10-CM | POA: Diagnosis not present

## 2019-08-21 DIAGNOSIS — M6281 Muscle weakness (generalized): Secondary | ICD-10-CM

## 2019-08-21 DIAGNOSIS — R2681 Unsteadiness on feet: Secondary | ICD-10-CM

## 2019-08-21 NOTE — Therapy (Signed)
[facility] [address] [place], [address] Phone: [phone]   Fax:  [phone]  Physical Therapy Treatment  Patient Details  Name: Glenn Watson MRN: [id number] Date of Birth: [birth year] Referring Provider (PT): Altamese [place], MD   Encounter Date: 08/21/2019  PT End of Session - 08/21/19 1405    Visit Number  29    Number of Visits  52    Date for PT Re-Evaluation  11/06/19    Authorization Type  [employer] reporting period from 07/03/2019    Authorization - Visit Number  9    Authorization - Number of Visits  10    PT Start Time  1120    PT Stop Time  1200    PT Time Calculation (min)  40 min    Activity Tolerance  Patient tolerated treatment well    Behavior During Therapy  [facility] for tasks assessed/performed       Past Medical History:  Diagnosis Date  . Anemia   . Asthma    as a child, exercise asthma - none since high school"  . Cancer ([place])    skin cancer - basal, squamous- Mylenoma - stage 2- chest  . Complication of anesthesia    "very sensitive to medications" "Had colonscopy with just Draminine"  . Family history of adverse reaction to anesthesia    daughter- N/V  . Hyperlipidemia   . Hypertension   . PONV (postoperative nausea and vomiting)     Past Surgical History:  Procedure Laterality Date  . APPENDECTOMY  2011   Dr. Bary Castilla  . COLONOSCOPY WITH PROPOFOL N/A 02/10/2017   Procedure: COLONOSCOPY WITH PROPOFOL;  Surgeon: Christene Lye, MD;  Location: [facility] ENDOSCOPY;  Service: Endoscopy;  Laterality: N/A;  . ORIF TIBIA PLATEAU Left 08/03/2019   Procedure: OPEN REDUCTION INTERNAL FIXATION (ORIF) TIBIAL PLATEAU;  Surgeon: Altamese Polo, MD;  Location: [facility];  Service: Orthopedics;  Laterality: Left;    There were no vitals filed for this visit.  Subjective Assessment - 08/21/19 1400    Subjective  Patient reports his knee is a bit swollen and sore today (rates  1/10 or less pain upon arrival), after he has been stretching it more aggressively. He saw Dr. Marcelino Scot last Wednesday, who said he was dissapointed at his lack of knee extension and said he would have to walk like a spider and demonstrated walking with flexed knees to illustrate, which was amusing to pt but really got him stretching harder at home. States Dr. Halford Decamp showed him something similar to what was prescribed in at PT last week and he has been propping up his R foot on a chair and pushing above his knee with his hands. He thinks he has made some progress, but does have some increased soreness. Dr. handy also said using the bike is okay, so pt plans to try this at the gym. He has been pretty sedentary this weekend and states he continues to get exhausted pretty quickly when moving around. He states he thinks his mood is doing pretty well and he doesn't feel depressed but he does get a bit down in the evenings sometimes when he is tired and sore. States no x-rays were taken and his incision looks good and is not draining. He keeps a bandage on it to prevent it from getting caught on his pants.    Patient is accompained by:  Family member    Pertinent History  L  ankle surgery; hypertension; history of falling; L knee pain.    Limitations  Lifting;Standing;Walking;House hold activities    How long can you stand comfortably?  can do dishes    How long can you walk comfortably?  can only go a small ways inside the home or from car to clinic before he needs to rest due to fatigue    Diagnostic tests  CT scan L knee report 07/18/2019 "IMPRESSION: 1. 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 2. Partial healing of the impacted fracture of the proximal fibula. 3. Moderate knee joint effusion."    Patient Stated Goals  Able to walk without AD    Currently in Pain?  Yes    Pain Score  1     Pain Location  Knee    Pain Orientation  Left    Pain Descriptors / Indicators  Sore    Pain  Onset  1 to 4 weeks ago      OBJECTIVE:   PROM Knee Flexion: L kneePROM 105 with moderated discomfort.*  Extension:-5* (after stretch unable to get finger under back of knee when resting on table, but still slightly not touching).  *indicates pain    TREATMENT:  Brace doffed during session  Therapeutic exercise:to centralize symptoms and improve ROM, strength, muscular endurance, and activity tolerance required for successful completion of functional activities. - supine L knee extension stretch with intermittent quad set heel elevated, toes strapped to maintain neutral, 10# ankle weight over knee. x4 min - quad set over half foam roll with [place] NMES estim. On/Off 10/10 seconds up to 65 mA intensity (to pt tolerance with palpable contraction), B LE working to encourage contralateral neurological overflow. With active quad contraction.  burst frequency 50 bps, duty cycle 50%, ramp 5 second. 2"x4" oval pads placed at proximal and distal L quad.  Includes set up for estim and and removal of pads  - attempted L ASLR but unable.  - education on how to add weight to stretch knee into extension with foot propped up on chair and toes toward ceiling.  - assisted pt with donning brace at end of session.   Manual therapy: to reduce pain and tissue tension, improve range of motion, neuromodulation, in order to promote improved ability to complete functional activities. - supine L knee extension with gentle OP x 5 - supine L hamstring stretch 3x30-60 seconds.   - supine L knee flexion with gentle OP  x10 - gentle testing of tibial integrity, slightly more movement than contralateral LE but much better than pre op.   HOME EXERCISE PROGRAM Access Code: [email or web address]      URL: [email or web address]    Date: 08/14/2019  Prepared by: Rosita Kea   Exercises Sidelying Hip Abduction - 3 sets - 10-12 reps - 1x daily - 7x weekly Clamshell with Resistance - 2-3 sets - 10 reps - 1x  daily - 7x weekly Supine Heel Slide with Strap - 2 sets - 10-20 reps - 5 seconds hold - 2x daily - 7x weekly Supine Quad Set on Towel Roll - 2 sets - 20 reps - 5 seconds hold - 2x daily   PT Education - 08/21/19 1405    Education Details  exercise purpose/form. self management techniques    Person(s) Educated  Patient    Methods  Demonstration;Tactile cues;Explanation;Verbal cues    Comprehension  Verbalized understanding;Returned demonstration;Verbal cues required;Tactile cues required;Need further instruction       PT Short Term Goals - 08/14/19  [place] #1   Title  Be independent with initial home exercise program for self-management of symptoms.    Baseline  initial HEP provided at IE (04/18/2019);    Time  2    Period  Weeks    Status  Achieved    Target Date  05/02/19        PT Long Term Goals - 08/14/19 1329      PT LONG TERM GOAL #1   Title  Be independent with a long-term home exercise program for self-management of symptoms.    Baseline  initial HEP provided at IE (04/18/2019); continues to participate in currently appropriate HEP (05/18/2019; 06/29/2019); updated HEP as appropriate for post-op rehab (08/14/2019);    Time  12    Period  Weeks    Status  Partially Met    Target Date  11/06/19      PT LONG TERM GOAL #2   Title  Demonstrate improved FOTO score by 10 units to demonstrate improvement in overall condition and self-reported functional ability.    Baseline  44 (04/18/2019); 52 (05/18/2019); 52 (06/29/2019); FOTO = 40 (08/14/2019);    Time  12    Period  Weeks    Status  On-going    Target Date  11/06/19      PT LONG TERM GOAL #3   Title  Complete community, work and/or recreational activities without limitation due to current condition.    Baseline  Difficulites with walking, standing, shopping, driving, cooking and bathing, currently NWB L LE (04/18/2019); continues with similar difficulties but is now PWB25% and is able to drive,  cross legs to don shoes (05/18/2019); improving but continues to have limitations and cannot perform activities that require single leg stance full weight bearing (06/29/2019); currently limited similarly to intial eval due to recent surgery 08/03/2019 to repair non-union fracture (08/14/2019);    Time  12    Period  Weeks    Status  On-going    Target Date  11/06/19      PT LONG TERM GOAL #4   Title  Increase L knee flexion AROM to 140 degrees to be able to complete ADLs without limitation of knee flexion    Baseline  PROM 95 degrees of flexion with pain at end range and AROM 80 degrees(04/18/2019); AAROM 110 with end range pain (05/18/2019); PROM 0-126, AROM ext -25, AAROM flex 115 (06/29/2019); PROM lacking 10 degrees extension, up to 100 degrees flexion (with brace on) (08/14/2019);    Time  12    Period  Weeks    Status  On-going    Target Date  11/06/19      PT LONG TERM GOAL #5   Title  Pt will increase LE strength to equal or greater than 4+/5 MMT in order to demonstrate improvement in strength and function.    Baseline  See objectives on IE(04/18/2019); still 2/10 L knee extension (lacking last 22 degrees against gravity) (05/18/2019); continues to lack terminal knee extension in open change position, improved to at least 4/5 with discomfort within available range (06/29/2019); has decreased in since last session due to recent surgery and away from PT 2 weeks (08/14/2019);    Time  12    Period  Weeks    Status  On-going    Target Date  11/06/19            Plan - 08/21/19 1412    Clinical Impression Statement  Patient tolerated treatment well overall with some diffuclty due to discomfort with estim stimulus and occasional discomfort at the L knee. No worse by end of session. Patient is showing improvements in PROM extension and flexion at the L knee. He continues to lack the ability to perform active end range knee extension or ASLR without lag. Future sessions will be used to  continue working on most relevant strength and ROM deficits, etc. Patient would benefit from continued management of limiting condition by skilled physical therapist to address remaining impairments and functional limitations to work towards stated goals and return to PLOF or maximal functional independence.    Personal Factors and Comorbidities  Comorbidity 2;Age    Comorbidities  L ankle surgery; hypertension; history of falling; L knee pain.    Examination-Activity Limitations  Bathing;Bed Mobility;Locomotion Level;Stairs;Stand;Hygiene/Grooming;Bend;Caring for Others;Sleep;Lift;Transfers;Toileting;Squat;Carry    Examination-Participation Restrictions  Yard Work;Driving;Meal Prep;Laundry;Shop;Cleaning    Stability/Clinical Decision Making  Evolving/Moderate complexity    Rehab Potential  Good    PT Frequency  2x / week    PT Duration  12 weeks    PT Treatment/Interventions  ADLs/Self Care Home Management;Cryotherapy;Moist Heat;Gait training;Stair training;Functional mobility training;Therapeutic activities;Therapeutic exercise;Balance training;Neuromuscular re-education;Manual techniques;Joint Manipulations;Passive range of motion;Dry needling;DME Instruction    PT Next Visit Plan  Strengthening, ROM and muscle endurance, gait training when appropriate    PT Home Exercise Plan  Medbridge: [email or web address]    Consulted and Agree with Plan of Care  Patient       Patient will benefit from skilled therapeutic intervention in order to improve the following deficits and impairments:  Abnormal gait, Decreased endurance, Decreased activity tolerance, Decreased range of motion, Decreased strength, Pain, Impaired flexibility, Difficulty walking, Decreased balance, Impaired perceived functional ability, Decreased knowledge of use of DME, Decreased mobility  Visit Diagnosis: Acute pain of left knee  Unsteadiness on feet  Muscle weakness (generalized)     Problem List Patient Active Problem List    Diagnosis Date Noted  . Hypertension   . Hyperlipidemia   . Asthma   . Tibial plateau fracture, left, closed, with nonunion, subsequent encounter 08/03/2019    Everlean Alstrom. Graylon Good, PT, DPT 08/21/19, 2:12 PM  [facility] [address] [place], [address] Phone: [phone]   Fax:  [phone]  Name: JUSTIS CLOSSER MRN: [id number] Date of Birth: [birth year]

## 2019-08-24 ENCOUNTER — Ambulatory Visit: Payer: PPO | Admitting: Physical Therapy

## 2019-08-24 ENCOUNTER — Other Ambulatory Visit: Payer: Self-pay

## 2019-08-24 DIAGNOSIS — M6281 Muscle weakness (generalized): Secondary | ICD-10-CM

## 2019-08-24 DIAGNOSIS — M25562 Pain in left knee: Secondary | ICD-10-CM

## 2019-08-24 DIAGNOSIS — R2681 Unsteadiness on feet: Secondary | ICD-10-CM

## 2019-08-24 NOTE — Therapy (Signed)
[facility] [address], [place], [address] Phone: [phone]   Fax:  [phone]  Physical Therapy Treatment / Progress Note Reporting Period: 08/14/2019 - 08/24/2019  Patient Details  Name: Glenn Watson MRN: [id number] Date of Birth: 11-May-1946 Referring Provider (PT): Altamese [place], MD   Encounter Date: 08/24/2019  PT End of Session - 08/24/19 1904    Visit Number  30    Number of Visits  52    Date for PT Re-Evaluation  11/06/19    Authorization Type  [employer] reporting period from 07/03/2019    Authorization - Visit Number  10    Authorization - Number of Visits  10    PT Start Time  1115    PT Stop Time  1200    PT Time Calculation (min)  45 min    Activity Tolerance  Patient tolerated treatment well    Behavior During Therapy  [facility] for tasks assessed/performed       Past Medical History:  Diagnosis Date  . Anemia   . Asthma    as a child, exercise asthma - none since high school"  . Cancer ([place])    skin cancer - basal, squamous- Mylenoma - stage 2- chest  . Complication of anesthesia    "very sensitive to medications" "Had colonscopy with just Draminine"  . Family history of adverse reaction to anesthesia    daughter- N/V  . Hyperlipidemia   . Hypertension   . PONV (postoperative nausea and vomiting)     Past Surgical History:  Procedure Laterality Date  . APPENDECTOMY  2011   Dr. Bary Castilla  . COLONOSCOPY WITH PROPOFOL N/A 02/10/2017   Procedure: COLONOSCOPY WITH PROPOFOL;  Surgeon: Christene Lye, MD;  Location: [facility] ENDOSCOPY;  Service: Endoscopy;  Laterality: N/A;  . ORIF TIBIA PLATEAU Left 08/03/2019   Procedure: OPEN REDUCTION INTERNAL FIXATION (ORIF) TIBIAL PLATEAU;  Surgeon: Altamese Haena, MD;  Location: [facility];  Service: Orthopedics;  Laterality: Left;    There were no vitals filed for this visit.   OBJECTIVE:  PROM Knee Flexion: L kneePROM110with  moderated discomfort with self overpressure on recumbent bike.  Extension:-2* (after stretch) *indicates pain   Lacking 25 degrees L knee extension AROM   TREATMENT:  Brace doffed during session  Therapeutic exercise:to centralize symptoms and improve ROM, strength, muscular endurance, and activity tolerance required for successful completion of functional activities. - Recumbent Bike. For improved lower extremity ROM, muscular endurance, and activity tolerance; and to induce the analgesic effect of aerobic exercise, stimulate joint nutrition, and prepare body structures and systems for following interventions. Seat setting 15 (5 min), setting 13 (5 min back and forth to stretch knee). 110 AAROM flexion on bike at end.  - supine L knee extension stretch with intermittent quad set heel elevated, toes strapped to maintain neutral, 10# ankle weight over knee. x4 min. Improved from lacking 10-15 degrees to nearly 0 degrees.  - very short arc quad over foam roll with [place] NMES estim. On/Off 5/5 seconds to start up to 60 mA intensity (to pt tolerance with palpable contraction), B LE working to encourage contralateral neurological overflow. With active quad contraction.  burst frequency 50 bps, duty cycle 50%, ramp 5 second. 2"x4" oval pads placed at proximal and distal L quad. 8 min of active running with extra time for set up for estim and and removal of pads, pauses to adjust settings. Lacking 25 degrees in active extension  with estim. Placed bolster under ankle due to pain upon descent. Also reduced on off time to 4/12 for improved rest.  Also completed some reps prior to estim set up.   - assisted pt with donning brace at end of session.   Manual therapy: to reduce pain and tissue tension, improve range of motion, neuromodulation, in order to promote improved ability to complete functional activities. - supine L knee extension with gentle OP x 5  HOME EXERCISE PROGRAM Access Code:  [email or web address] URL: [email or web address] Date: 08/14/2019  Prepared by: Rosita Kea   Exercises Sidelying Hip Abduction - 3 sets - 10-12 reps - 1x daily - 7x weekly Clamshell with Resistance - 2-3 sets - 10 reps - 1x daily - 7x weekly Supine Heel Slide with Strap - 2 sets - 10-20 reps - 5 seconds hold - 2x daily - 7x weekly Supine Quad Set on Towel Roll - 2 sets - 20 reps - 5 seconds hold - 2x daily    PT Education - 08/24/19 1915    Education Details  exercise purpose/form. self management techniques    Person(s) Educated  Patient    Methods  Explanation;Demonstration;Tactile cues;Verbal cues    Comprehension  Verbalized understanding;Returned demonstration;Verbal cues required;Tactile cues required;Need further instruction       PT Short Term Goals - 08/14/19 1328      PT SHORT TERM GOAL #1   Title  Be independent with initial home exercise program for self-management of symptoms.    Baseline  initial HEP provided at IE (04/18/2019);    Time  2    Period  Weeks    Status  Achieved    Target Date  05/02/19        PT Long Term Goals - 08/24/19 1912      PT LONG TERM GOAL #1   Title  Be independent with a long-term home exercise program for self-management of symptoms.    Baseline  initial HEP provided at IE (04/18/2019); continues to participate in currently appropriate HEP (05/18/2019; 06/29/2019); updated HEP as appropriate for post-op rehab (08/14/2019); currently participating in appropriate HEP, has not yet received final long term HEP (08/24/2019);    Time  12    Period  Weeks    Status  Partially Met    Target Date  11/06/19      PT LONG TERM GOAL #2   Title  Demonstrate improved FOTO score by 10 units to demonstrate improvement in overall condition and self-reported functional ability.    Baseline  44 (04/18/2019); 52 (05/18/2019); 52 (06/29/2019); FOTO = 40 (08/14/2019);    Time  12    Period  Weeks    Status  On-going    Target Date  11/06/19       PT LONG TERM GOAL #3   Title  Complete community, work and/or recreational activities without limitation due to current condition.    Baseline  Difficulites with walking, standing, shopping, driving, cooking and bathing, currently NWB L LE (04/18/2019); continues with similar difficulties but is now PWB25% and is able to drive, cross legs to don shoes (05/18/2019); improving but continues to have limitations and cannot perform activities that require single leg stance full weight bearing (06/29/2019); currently limited similarly to intial eval due to recent surgery 08/03/2019 to repair non-union fracture (08/14/2019); continues to have similar limitations (08/24/2019);    Time  12    Period  Weeks    Status  On-going    Target Date  11/06/19  PT LONG TERM GOAL #4   Title  Increase L knee flexion AROM to 140 degrees to be able to complete ADLs without limitation of knee flexion    Baseline  PROM 95 degrees of flexion with pain at end range and AROM 80 degrees(04/18/2019); AAROM 110 with end range pain (05/18/2019); PROM 0-126, AROM ext -25, AAROM flex 115 (06/29/2019); PROM lacking 10 degrees extension, up to 100 degrees flexion (with brace on) (08/14/2019); PROM extension lacking ~ 2 degrees, PROM flexion: 110 degrees (08/24/2019);    Time  12    Period  Weeks    Status  Partially Met    Target Date  11/06/19      PT LONG TERM GOAL #5   Title  Pt will increase LE strength to equal or greater than 4+/5 MMT in order to demonstrate improvement in strength and function.    Baseline  See objectives on IE(04/18/2019); still 2/10 L knee extension (lacking last 22 degrees against gravity) (05/18/2019); continues to lack terminal knee extension in open change position, improved to at least 4/5 with discomfort within available range (06/29/2019); has decreased in since last session due to recent surgery and away from PT 2 weeks (08/14/2019); 25 degree extensor lag (08/24/2019);    Time  12    Period   Weeks    Status  On-going    Target Date  11/06/19            Plan - 08/24/19 1911    Clinical Impression Statement  Patient has attended 30 physical therapy sessions this episode of care and three since undergoing ORIF surgery to the L tibial plateu with repair of non-union fracture there. Patient is progressing appropriately at this time considering his history of non-union and surgical intervention. Patient has nearly restored PROM L knee flexion, and is making progress towards flexion ROM. He has considerable weakness in L quad and poor activation which results in 25 degree extensor lag, which he has struggled with since his injury. Patient is participating well in appropriate HEP at this point. Patient continues to demonstrate significant impairments such as deficits in muscle performance (strength/power/endurance), ROM, joint stiffness, tissue integrity, mobility, pain, weight bearing restrictions. These deficits limit the patient ability to perform things such as ADLs, IADLs, social participation, caring for others, engaging in hobbies (house working, cooking, and driving), and impairs his quality of life. Patient would benefit from continued management of limiting condition by skilled physical therapist to address remaining impairments and functional limitations to work towards stated goals and return to PLOF or maximal functional independence.    Personal Factors and Comorbidities  Comorbidity 2;Age    Comorbidities  L ankle surgery; hypertension; history of falling; L knee pain.    Examination-Activity Limitations  Bathing;Bed Mobility;Locomotion Level;Stairs;Stand;Hygiene/Grooming;Bend;Caring for Others;Sleep;Lift;Transfers;Toileting;Squat;Carry    Examination-Participation Restrictions  Yard Work;Driving;Meal Prep;Laundry;Shop;Cleaning    Stability/Clinical Decision Making  Evolving/Moderate complexity    Rehab Potential  Good    PT Frequency  2x / week    PT Duration  12 weeks    PT  Treatment/Interventions  ADLs/Self Care Home Management;Cryotherapy;Moist Heat;Gait training;Stair training;Functional mobility training;Therapeutic activities;Therapeutic exercise;Balance training;Neuromuscular re-education;Manual techniques;Joint Manipulations;Passive range of motion;Dry needling;DME Instruction    PT Next Visit Plan  Strengthening, ROM and muscle endurance, gait training when appropriate    PT Home Exercise Plan  Med[employer]: [email or web address]    Consulted and Agree with Plan of Care  Patient       Patient will benefit from skilled therapeutic intervention in  order to improve the following deficits and impairments:  Abnormal gait, Decreased endurance, Decreased activity tolerance, Decreased range of motion, Decreased strength, Pain, Impaired flexibility, Difficulty walking, Decreased balance, Impaired perceived functional ability, Decreased knowledge of use of DME, Decreased mobility  Visit Diagnosis: Acute pain of left knee  Unsteadiness on feet  Muscle weakness (generalized)     Problem List Patient Active Problem List   Diagnosis Date Noted  . Hypertension   . Hyperlipidemia   . Asthma   . Tibial plateau fracture, left, closed, with nonunion, subsequent encounter 08/03/2019    Everlean Alstrom. Graylon Good, PT, DPT 08/24/19, 7:15 PM  [facility] [address], [place], [address] Phone: [phone]   Fax:  [phone]  Name: AMORI COLOMB MRN: [id number] Date of Birth: 15-Mar-1946

## 2019-08-29 ENCOUNTER — Other Ambulatory Visit: Payer: Self-pay

## 2019-08-29 ENCOUNTER — Ambulatory Visit: Payer: PPO | Attending: Orthopedic Surgery | Admitting: Physical Therapy

## 2019-08-29 DIAGNOSIS — M6281 Muscle weakness (generalized): Secondary | ICD-10-CM | POA: Diagnosis not present

## 2019-08-29 DIAGNOSIS — R2681 Unsteadiness on feet: Secondary | ICD-10-CM | POA: Diagnosis not present

## 2019-08-29 DIAGNOSIS — M25562 Pain in left knee: Secondary | ICD-10-CM | POA: Insufficient documentation

## 2019-08-29 NOTE — Therapy (Signed)
[facility] [address], [place], [address] Phone: [phone]   Fax:  [phone]  Physical Therapy Treatment  Patient Details  Name: Glenn Watson MRN: [id number] Date of Birth: [birth year] Referring Provider (PT): Altamese Muenster, MD   Encounter Date: 08/29/2019  PT End of Session - 08/30/19 2022    Visit Number  31    Number of Visits  52    Date for PT Re-Evaluation  11/06/19    Authorization Type  [employer] reporting period from 08/29/2019    Authorization - Visit Number  1    Authorization - Number of Visits  10    PT Start Time  1115    PT Stop Time  1200    PT Time Calculation (min)  45 min    Activity Tolerance  Patient tolerated treatment well    Behavior During Therapy  [facility] for tasks assessed/performed       Past Medical History:  Diagnosis Date  . Anemia   . Asthma    as a child, exercise asthma - none since high school"  . Cancer ([place])    skin cancer - basal, squamous- Mylenoma - stage 2- chest  . Complication of anesthesia    "very sensitive to medications" "Had colonscopy with just Draminine"  . Family history of adverse reaction to anesthesia    daughter- N/V  . Hyperlipidemia   . Hypertension   . PONV (postoperative nausea and vomiting)     Past Surgical History:  Procedure Laterality Date  . APPENDECTOMY  2011   Dr. Bary Castilla  . COLONOSCOPY WITH PROPOFOL N/A 02/10/2017   Procedure: COLONOSCOPY WITH PROPOFOL;  Surgeon: Christene Lye, MD;  Location: [facility];  Service: Endoscopy;  Laterality: N/A;  . ORIF TIBIA PLATEAU Left 08/03/2019   Procedure: OPEN REDUCTION INTERNAL FIXATION (ORIF) TIBIAL PLATEAU;  Surgeon: Altamese Fairforest, MD;  Location: [place];  Service: Orthopedics;  Laterality: Left;    There were no vitals filed for this visit.  Subjective Assessment - 08/30/19 2012    Subjective  Patient reports he is feeling well and his incision is  continuing to heal well. He was able to take a shower yesterday for the first time since surgery. He spent at least 17 min on the bike yesterday and meant to do some arm machines but was too tired in general after he completed exercise on the bike and one arm machine. He states going to the gym and using the bike was the highlight of his weekend since last session. He reports he had a bit of pain after, but nothing he cannot tolerate. He continues to stretch into extension and do quad sets at home.    Patient is accompained by:  Family member    Pertinent History  L ankle surgery; hypertension; history of falling; L knee pain.    Limitations  Lifting;Standing;Walking;House hold activities    How long can you stand comfortably?  can do dishes    How long can you walk comfortably?  can only go a small ways inside the home or from car to clinic before he needs to rest due to fatigue    Diagnostic tests  CT scan L knee report 07/18/2019 "IMPRESSION: 1. 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 2. Partial healing of the impacted fracture of the proximal fibula. 3. Moderate knee joint effusion."    Patient Stated Goals  Able to walk without AD  Currently in Pain?  Yes    Pain Location  Knee    Pain Orientation  Left    Pain Descriptors / Indicators  Sore    Pain Type  Surgical pain    Pain Onset  1 to 4 weeks ago      OBJECTIVE:  PROM Knee Flexion: L kneePROM120*following manual stretching.  Extension:-5*(after stretch) *indicates pain   Lacking 25 degrees L knee extension AROM  TREATMENT:  Bracedoffed during session  Therapeutic exercise:to centralize symptoms and improve ROM, strength, muscular endurance, and activity tolerance required for successful completion of functional activities. - supine L knee extension stretch with intermittent quad set heel elevated, toes strapped to maintain neutral, 10# ankle weight over knee. x4 min. Improved  from lacking 15-20 degrees to nearly-7 degrees.  - very short arc quad over foam roll with [place] NMES estim. On/Off 4/12 seconds to start up to 60 mA intensity (to pt tolerance with palpable contraction). With active quad contraction. burst frequency 50 bps, duty cycle 50%, ramp 5 second. 2"x4" oval pads placed at proximal and distal L quad. 8 min of active running with extra time for set up for estim and and removal of pads, pauses to adjust settings. Lacking 25 degrees in active extension with estim but more robust contraction this session compared to last and no pain as last time.  - assisted pt with donning brace at end of session.  Manual therapy:to reduce pain and tissue tension, improve range of motion, neuromodulation, in order to promote improved ability to complete functional activities. - supine L knee extension with manual overpressure, heel elevated, grade III-IV oscillations and sustained pressure as tolerated at knee. Several minutes progressed to lacking 5 degrees. Still able to get fingers under back of knee when flat on plinth. Educated pt about importance of more vigorous stretching at home as he did after his last visit with Dr. Marcelino Scot.   - hooklying AP and PA joint mobilizations grades II-III at L tibiofibular joint to improve motion.  - hooklying L knee flexion over clinician forearm behind knee and braced on contralateral knee with OP as tolerated.  - supine PROM flexion with oscillatory and sustained overpressure at end range as tolerated. Several minutes progressing from 105 degrees to 120 by end of manual.  - supine L ankle AP and PA talocrual joint mobilizations grade III-IV with dorsiflexion stretch to improve ROM for ambulation in the future.   HOME EXERCISE PROGRAM Access Code: [email or web address] URL: [email or web address] Date: 08/14/2019  Prepared by: Rosita Kea   Exercises Sidelying Hip Abduction - 3 sets - 10-12 reps - 1x daily - 7x  weekly Clamshell with Resistance - 2-3 sets - 10 reps - 1x daily - 7x weekly Supine Heel Slide with Strap - 2 sets - 10-20 reps - 5 seconds hold - 2x daily - 7x weekly Supine Quad Set on Towel Roll - 2 sets - 20 reps - 5 seconds hold - 2x daily    PT Education - 08/30/19 2024    Education Details  exercise purpose/form. self management techniques. HEP    Person(s) Educated  Patient    Methods  Explanation;Demonstration;Tactile cues;Verbal cues    Comprehension  Verbalized understanding;Returned demonstration;Verbal cues required;Tactile cues required;Need further instruction       PT Short Term Goals - 08/14/19 1328      PT SHORT TERM GOAL #1   Title  Be independent with initial home exercise program for self-management of symptoms.    Baseline  initial  HEP provided at IE (04/18/2019);    Time  2    Period  Weeks    Status  Achieved    Target Date  05/02/19        PT Long Term Goals - 08/24/19 1912      PT LONG TERM GOAL #1   Title  Be independent with a long-term home exercise program for self-management of symptoms.    Baseline  initial HEP provided at IE (04/18/2019); continues to participate in currently appropriate HEP (05/18/2019; 06/29/2019); updated HEP as appropriate for post-op rehab (08/14/2019); currently participating in appropriate HEP, has not yet received final long term HEP (08/24/2019);    Time  12    Period  Weeks    Status  Partially Met    Target Date  11/06/19      PT LONG TERM GOAL #2   Title  Demonstrate improved FOTO score by 10 units to demonstrate improvement in overall condition and self-reported functional ability.    Baseline  44 (04/18/2019); 52 (05/18/2019); 52 (06/29/2019); FOTO = 40 (08/14/2019);    Time  12    Period  Weeks    Status  On-going    Target Date  11/06/19      PT LONG TERM GOAL #3   Title  Complete community, work and/or recreational activities without limitation due to current condition.    Baseline  Difficulites with  walking, standing, shopping, driving, cooking and bathing, currently NWB L LE (04/18/2019); continues with similar difficulties but is now PWB25% and is able to drive, cross legs to don shoes (05/18/2019); improving but continues to have limitations and cannot perform activities that require single leg stance full weight bearing (06/29/2019); currently limited similarly to intial eval due to recent surgery 08/03/2019 to repair non-union fracture (08/14/2019); continues to have similar limitations (08/24/2019);    Time  12    Period  Weeks    Status  On-going    Target Date  11/06/19      PT LONG TERM GOAL #4   Title  Increase L knee flexion AROM to 140 degrees to be able to complete ADLs without limitation of knee flexion    Baseline  PROM 95 degrees of flexion with pain at end range and AROM 80 degrees(04/18/2019); AAROM 110 with end range pain (05/18/2019); PROM 0-126, AROM ext -25, AAROM flex 115 (06/29/2019); PROM lacking 10 degrees extension, up to 100 degrees flexion (with brace on) (08/14/2019); PROM extension lacking ~ 2 degrees, PROM flexion: 110 degrees (08/24/2019);    Time  12    Period  Weeks    Status  Partially Met    Target Date  11/06/19      PT LONG TERM GOAL #5   Title  Pt will increase LE strength to equal or greater than 4+/5 MMT in order to demonstrate improvement in strength and function.    Baseline  See objectives on IE(04/18/2019); still 2/10 L knee extension (lacking last 22 degrees against gravity) (05/18/2019); continues to lack terminal knee extension in open change position, improved to at least 4/5 with discomfort within available range (06/29/2019); has decreased in since last session due to recent surgery and away from PT 2 weeks (08/14/2019); 25 degree extensor lag (08/24/2019);    Time  12    Period  Weeks    Status  On-going    Target Date  11/06/19            Plan - 08/30/19 2022  Clinical Impression Statement  Patient tolerated treatment well overall and  showed excellent improvement in PROM L knee flexion with manual therapy. Showed a bit of regression in knee extension, so advised patient of regression and recommended more aggressive and sustained stretching at home to return to near or at 0 degrees extension. Patient continues to demonstrate extensor lag but demonstrated more robust L quad contraction without pain during very short arc quad exercise with estim. Patient would benefit from continued management of limiting condition by skilled physical therapist to address remaining impairments and functional limitations to work towards stated goals and return to PLOF or maximal functional independence.    Personal Factors and Comorbidities  Comorbidity 2;Age    Comorbidities  L ankle surgery; hypertension; history of falling; L knee pain.    Examination-Activity Limitations  Bathing;Bed Mobility;Locomotion Level;Stairs;Stand;Hygiene/Grooming;Bend;Caring for Others;Sleep;Lift;Transfers;Toileting;Squat;Carry    Examination-Participation Restrictions  Yard Work;Driving;Meal Prep;Laundry;Shop;Cleaning    Stability/Clinical Decision Making  Evolving/Moderate complexity    Rehab Potential  Good    PT Frequency  2x / week    PT Duration  12 weeks    PT Treatment/Interventions  ADLs/Self Care Home Management;Cryotherapy;Moist Heat;Gait training;Stair training;Functional mobility training;Therapeutic activities;Therapeutic exercise;Balance training;Neuromuscular re-education;Manual techniques;Joint Manipulations;Passive range of motion;Dry needling;DME Instruction    PT Next Visit Plan  Strengthening, ROM and muscle endurance, gait training when appropriate    PT Home Exercise Plan  Medbridge: [email or web address]    Consulted and Agree with Plan of Care  Patient       Patient will benefit from skilled therapeutic intervention in order to improve the following deficits and impairments:  Abnormal gait, Decreased endurance, Decreased activity tolerance, Decreased range of  motion, Decreased strength, Pain, Impaired flexibility, Difficulty walking, Decreased balance, Impaired perceived functional ability, Decreased knowledge of use of DME, Decreased mobility  Visit Diagnosis: Acute pain of left knee  Unsteadiness on feet  Muscle weakness (generalized)     Problem List Patient Active Problem List   Diagnosis Date Noted  . Hypertension   . Hyperlipidemia   . Asthma   . Tibial plateau fracture, left, closed, with nonunion, subsequent encounter 08/03/2019    Everlean Alstrom. Graylon Good, PT, DPT 08/30/19, 8:25 PM  [facility] [address], [place], [address] Phone: [phone]   Fax:  [phone]  Name: Glenn Watson MRN: [id number] Date of Birth: 1945/11/24

## 2019-08-30 ENCOUNTER — Encounter: Payer: Self-pay | Admitting: Physical Therapy

## 2019-08-31 ENCOUNTER — Encounter: Payer: Self-pay | Admitting: Physical Therapy

## 2019-08-31 ENCOUNTER — Other Ambulatory Visit: Payer: Self-pay

## 2019-08-31 ENCOUNTER — Ambulatory Visit: Payer: PPO | Admitting: Physical Therapy

## 2019-08-31 DIAGNOSIS — R2681 Unsteadiness on feet: Secondary | ICD-10-CM

## 2019-08-31 DIAGNOSIS — M25562 Pain in left knee: Secondary | ICD-10-CM | POA: Diagnosis not present

## 2019-08-31 DIAGNOSIS — M6281 Muscle weakness (generalized): Secondary | ICD-10-CM

## 2019-08-31 NOTE — Therapy (Signed)
[facility] [address], [place], [address] Phone: [phone]   Fax:  [phone]  Physical Therapy Treatment  Patient Details  Name: Glenn Watson MRN: [id number] Date of Birth: December 05, 1945 Referring Provider (PT): Altamese Keystone, MD   Encounter Date: 08/31/2019  PT End of Session - 08/31/19 1209    Visit Number  32    Number of Visits  52    Date for PT Re-Evaluation  11/06/19    Authorization Type  [employer] reporting period from 08/29/2019    Authorization - Visit Number  2    Authorization - Number of Visits  10    PT Start Time  1120    PT Stop Time  1200    PT Time Calculation (min)  40 min    Activity Tolerance  Patient tolerated treatment well    Behavior During Therapy  [facility] for tasks assessed/performed       Past Medical History:  Diagnosis Date  . Anemia   . Asthma    as a child, exercise asthma - none since high school"  . Cancer ([place])    skin cancer - basal, squamous- Mylenoma - stage 2- chest  . Complication of anesthesia    "very sensitive to medications" "Had colonscopy with just Draminine"  . Family history of adverse reaction to anesthesia    daughter- N/V  . Hyperlipidemia   . Hypertension   . PONV (postoperative nausea and vomiting)     Past Surgical History:  Procedure Laterality Date  . APPENDECTOMY  2011   Dr. Bary Castilla  . COLONOSCOPY WITH PROPOFOL N/A 02/10/2017   Procedure: COLONOSCOPY WITH PROPOFOL;  Surgeon: Christene Lye, MD;  Location: [facility] END[facility];  Service: Endoscopy;  Laterality: N/A;  . ORIF TIBIA PLATEAU Left 08/03/2019   Procedure: OPEN REDUCTION INTERNAL FIXATION (ORIF) TIBIAL PLATEAU;  Surgeon: Altamese Odell, MD;  Location: [place];  Service: Orthopedics;  Laterality: Left;    There were no vitals filed for this visit.  Subjective Assessment - 08/31/19 1144    Subjective  Patient states he is feeling well today. Has some soreness in  the left knee he describes as less than 1/10. Was fairly sore from his last treatment session but not intolerable. He worked really hard at extension stretching yesterday and made it sore again and had to take a tylenol last night. Feeling well today. States he has been using his leg to move it instead of his arms.    Patient is accompained by:  Family member    Pertinent History  L ankle surgery; hypertension; history of falling; L knee pain.    Limitations  Lifting;Standing;Walking;House hold activities    How long can you stand comfortably?  can do dishes    How long can you walk comfortably?  can only go a small ways inside the home or from car to clinic before he needs to rest due to fatigue    Diagnostic tests  CT scan L knee report 07/18/2019 "IMPRESSION: 1. 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 2. Partial healing of the impacted fracture of the proximal fibula. 3. Moderate knee joint effusion."    Patient Stated Goals  Able to walk without AD    Currently in Pain?  Yes    Pain Score  1     Pain Location  Knee    Pain Orientation  Left    Pain Descriptors / Indicators  Sore    Pain Type  Surgical pain    Pain Onset  1 to 4 weeks ago      OBJECTIVE: PROM Knee Flexion: L [id number] (prior to stretching) 120*(following manual stretching)  Extension:-10 (prior to stretch) 0*(after stretch) *indicates pain  L knee AROM: extension: Lacking 20 degrees (after manual); Flexion: 105 (rior to manual)  TREATMENT:  Bracedoffed during session  Therapeutic exercise:to centralize symptoms and improve ROM, strength, muscular endurance, and activity tolerance required for successful completion of functional activities. - supine L knee extension stretch with intermittent quad set heel elevated, toes strapped to maintain neutral, 10# ankle weight over knee. x4 min. Improved from lacking 10 degrees to nearly -5 degrees.  -very short arc quad  overfoam roll with [place] NMES estim. On/Off4/12 seconds to startup to 30m intensity (to pt tolerance with palpable contraction). With active quad contraction. burst frequency 50 bps, duty cycle 50%, ramp 5 second. 2"x4" oval pads placed at proximal and distal L quad.8 min of active running with extra time forset up for estim and and removal of pads. Use of R quad set as well for contralateral neurologic overflow.  Lacking 20 degrees in active extension with estim but continues more robust contraction this session compared to last and no pain as last time.  - assisted pt with donning brace at end of session.  Manual therapy:to reduce pain and tissue tension, improve range of motion, neuromodulation, in order to promote improved ability to complete functional activities. - supine L knee extension with manual overpressure, heel elevated, grade III-IV oscillations and sustained pressure as tolerated at knee. Several minutes progressed to 0 degrees extension and unable to get finger under back of knee flat on plinth. - hooklying AP and PA joint mobilizations grades II-III at L tibiofibular joint to improve motion. Small cavitation noted at lateral posterior knee with PA mobilization.  - hooklying L knee flexion over clinician forearm behind knee and braced on contralateral knee with OP as tolerated.  - supine PROM flexion with oscillatory and sustained overpressure at end range as tolerated. Several minutes progressing from 110 degrees to 120 by end of manual.    HOME EXERCISE PROGRAM Access Code: [email or web address] URL: [email or web address] Date: 08/14/2019  Prepared by: SRosita Kea  Exercises Sidelying Hip Abduction - 3 sets - 10-12 reps - 1x daily - 7x weekly Clamshell with Resistance - 2-3 sets - 10 reps - 1x daily - 7x weekly Supine Heel Slide with Strap - 2 sets - 10-20 reps - 5 seconds hold - 2x daily - 7x weekly Supine Quad Set on Towel Roll - 2 sets - 20 reps - 5  seconds hold - 2x daily   PT Education - 08/31/19 1209    Education Details  exercise purpose/form. self management techniques.    Person(s) Educated  Patient    Methods  Explanation;Demonstration;Tactile cues;Verbal cues    Comprehension  Verbalized understanding;Returned demonstration;Verbal cues required;Tactile cues required;Need further instruction       PT Short Term Goals - 08/14/19 1328      PT SHORT TERM GOAL #1   Title  Be independent with initial home exercise program for self-management of symptoms.    Baseline  initial HEP provided at IE (04/18/2019);    Time  2    Period  Weeks    Status  Achieved    Target Date  05/02/19        PT Long Term Goals - 08/24/19 1912  PT LONG TERM GOAL #1   Title  Be independent with a long-term home exercise program for self-management of symptoms.    Baseline  initial HEP provided at IE (04/18/2019); continues to participate in currently appropriate HEP (05/18/2019; 06/29/2019); updated HEP as appropriate for post-op rehab (08/14/2019); currently participating in appropriate HEP, has not yet received final long term HEP (08/24/2019);    Time  12    Period  Weeks    Status  Partially Met    Target Date  11/06/19      PT LONG TERM GOAL #2   Title  Demonstrate improved FOTO score by 10 units to demonstrate improvement in overall condition and self-reported functional ability.    Baseline  44 (04/18/2019); 52 (05/18/2019); 52 (06/29/2019); FOTO = 40 (08/14/2019);    Time  12    Period  Weeks    Status  On-going    Target Date  11/06/19      PT LONG TERM GOAL #3   Title  Complete community, work and/or recreational activities without limitation due to current condition.    Baseline  Difficulites with walking, standing, shopping, driving, cooking and bathing, currently NWB L LE (04/18/2019); continues with similar difficulties but is now PWB25% and is able to drive, cross legs to don shoes (05/18/2019); improving but continues to  have limitations and cannot perform activities that require single leg stance full weight bearing (06/29/2019); currently limited similarly to intial eval due to recent surgery 08/03/2019 to repair non-union fracture (08/14/2019); continues to have similar limitations (08/24/2019);    Time  12    Period  Weeks    Status  On-going    Target Date  11/06/19      PT LONG TERM GOAL #4   Title  Increase L knee flexion AROM to 140 degrees to be able to complete ADLs without limitation of knee flexion    Baseline  PROM 95 degrees of flexion with pain at end range and AROM 80 degrees(04/18/2019); AAROM 110 with end range pain (05/18/2019); PROM 0-126, AROM ext -25, AAROM flex 115 (06/29/2019); PROM lacking 10 degrees extension, up to 100 degrees flexion (with brace on) (08/14/2019); PROM extension lacking ~ 2 degrees, PROM flexion: 110 degrees (08/24/2019);    Time  12    Period  Weeks    Status  Partially Met    Target Date  11/06/19      PT LONG TERM GOAL #5   Title  Pt will increase LE strength to equal or greater than 4+/5 MMT in order to demonstrate improvement in strength and function.    Baseline  See objectives on IE(04/18/2019); still 2/10 L knee extension (lacking last 22 degrees against gravity) (05/18/2019); continues to lack terminal knee extension in open change position, improved to at least 4/5 with discomfort within available range (06/29/2019); has decreased in since last session due to recent surgery and away from PT 2 weeks (08/14/2019); 25 degree extensor lag (08/24/2019);    Time  12    Period  Weeks    Status  On-going    Target Date  11/06/19            Plan - 08/31/19 1211    Clinical Impression Statement  Patient tolerated treatment well with some end range discomfort during PROM and stretching activities. Continues to demonstrate improving ROM and better quad contraction although he requires stretching to reach approaching normal PROM range. Improved extension today second to  pt more aggressively stretching it  at home. Patient would benefit from continued management of limiting condition by skilled physical therapist to address remaining impairments and functional limitations to work towards stated goals and return to PLOF or maximal functional independence.    Personal Factors and Comorbidities  Comorbidity 2;Age    Comorbidities  L ankle surgery; hypertension; history of falling; L knee pain.    Examination-Activity Limitations  Bathing;Bed Mobility;Locomotion Level;Stairs;Stand;Hygiene/Grooming;Bend;Caring for Others;Sleep;Lift;Transfers;Toileting;Squat;Carry    Examination-Participation Restrictions  Yard Work;Driving;Meal Prep;Laundry;Shop;Cleaning    Stability/Clinical Decision Making  Evolving/Moderate complexity    Rehab Potential  Good    PT Frequency  2x / week    PT Duration  12 weeks    PT Treatment/Interventions  ADLs/Self Care Home Management;Cryotherapy;Moist Heat;Gait training;Stair training;Functional mobility training;Therapeutic activities;Therapeutic exercise;Balance training;Neuromuscular re-education;Manual techniques;Joint Manipulations;Passive range of motion;Dry needling;DME Instruction    PT Next Visit Plan  Strengthening, ROM and muscle endurance, gait training when appropriate    PT Home Exercise Plan  Medbridge: [email or web address]    Consulted and Agree with Plan of Care  Patient       Patient will benefit from skilled therapeutic intervention in order to improve the following deficits and impairments:  Abnormal gait, Decreased endurance, Decreased activity tolerance, Decreased range of motion, Decreased strength, Pain, Impaired flexibility, Difficulty walking, Decreased balance, Impaired perceived functional ability, Decreased knowledge of use of DME, Decreased mobility  Visit Diagnosis: Acute pain of left knee  Unsteadiness on feet  Muscle weakness (generalized)     Problem List Patient Active Problem List   Diagnosis Date Noted  .  Hypertension   . Hyperlipidemia   . Asthma   . Tibial plateau fracture, left, closed, with nonunion, subsequent encounter 08/03/2019    Everlean Alstrom. Graylon Good, PT, DPT 08/31/19, 12:11 PM  [facility] [address] [place], [address] Phone: [phone]   Fax:  [phone]  Name: JARYN ROSKO MRN: [id number] Date of Birth: 08-01-1945

## 2019-09-01 ENCOUNTER — Ambulatory Visit: Payer: PPO | Attending: Internal Medicine

## 2019-09-01 DIAGNOSIS — Z23 Encounter for immunization: Secondary | ICD-10-CM | POA: Insufficient documentation

## 2019-09-01 NOTE — Progress Notes (Signed)
   Covid-19 Vaccination Clinic  Name:  Glenn Watson    MRN: [id number] DOB: 1946-02-07  09/01/2019  Mr. Glenn Watson was observed post Covid-19 immunization for 15 minutes without incident. He was provided with Vaccine Information Sheet and instruction to access the V-Safe system.   Mr. Glenn Watson was instructed to call 911 with any severe reactions post vaccine: Marland Kitchen Difficulty breathing  . Swelling of face and throat  . A fast heartbeat  . A bad rash all over body  . Dizziness and weakness

## 2019-09-05 ENCOUNTER — Ambulatory Visit: Payer: PPO | Admitting: Physical Therapy

## 2019-09-05 ENCOUNTER — Encounter: Payer: Self-pay | Admitting: Physical Therapy

## 2019-09-05 ENCOUNTER — Other Ambulatory Visit: Payer: Self-pay

## 2019-09-05 DIAGNOSIS — R2681 Unsteadiness on feet: Secondary | ICD-10-CM

## 2019-09-05 DIAGNOSIS — M6281 Muscle weakness (generalized): Secondary | ICD-10-CM

## 2019-09-05 DIAGNOSIS — M25562 Pain in left knee: Secondary | ICD-10-CM | POA: Diagnosis not present

## 2019-09-05 NOTE — Therapy (Signed)
[facility] [address] [place], [address] Phone: [phone]   Fax:  [phone]  Physical Therapy Treatment  Patient Details  Name: Glenn Watson MRN: [id number] Date of Birth: 1945-10-24 Referring Provider (PT): Altamese Superior, MD   Encounter Date: 09/05/2019  PT End of Session - 09/05/19 1249    Visit Number  33    Number of Visits  52    Date for PT Re-Evaluation  11/06/19    Authorization Type  [employer] reporting period from 08/29/2019    Authorization - Visit Number  3    Authorization - Number of Visits  10    PT Start Time  1120    PT Stop Time  1200    PT Time Calculation (min)  40 min    Activity Tolerance  Patient tolerated treatment well    Behavior During Therapy  [facility] for tasks assessed/performed       Past Medical History:  Diagnosis Date  . Anemia   . Asthma    as a child, exercise asthma - none since high school"  . Cancer ([place])    skin cancer - basal, squamous- Mylenoma - stage 2- chest  . Complication of anesthesia    "very sensitive to medications" "Had colonscopy with just Draminine"  . Family history of adverse reaction to anesthesia    daughter- N/V  . Hyperlipidemia   . Hypertension   . PONV (postoperative nausea and vomiting)     Past Surgical History:  Procedure Laterality Date  . APPENDECTOMY  2011   Dr. Bary Castilla  . COLONOSCOPY WITH PROPOFOL N/A 02/10/2017   Procedure: COLONOSCOPY WITH PROPOFOL;  Surgeon: Christene Lye, MD;  Location: [facility];  Service: Endoscopy;  Laterality: N/A;  . ORIF TIBIA PLATEAU Left 08/03/2019   Procedure: OPEN REDUCTION INTERNAL FIXATION (ORIF) TIBIAL PLATEAU;  Surgeon: Altamese Cupertino, MD;  Location: [facility];  Service: Orthopedics;  Laterality: Left;    There were no vitals filed for this visit.  Subjective Assessment - 09/05/19 1143    Subjective  Patient reports he is feeling good today. No pain upon arrival.  Has soreness after bouts of stretching that improve over time. Did the bike at the gym. Got his COVID 19 vaccine #1 without any side effects. Has noticed a spot that is protruding a bit on the lateral proximal L tibia that was very tender to touch at first, but now feels better to touch. Noticed it about 2 days ago. Plans to ask Dr. Marcelino Scot about it tomorrow at his follow up. Has noticed improvement in his ability to move his leg without using his arms with practice.    Patient is accompained by:  Family member    Pertinent History  L ankle surgery; hypertension; history of falling; L knee pain.    Limitations  Lifting;Standing;Walking;House hold activities    How long can you stand comfortably?  can do dishes    How long can you walk comfortably?  can only go a small ways inside the home or from car to clinic before he needs to rest due to fatigue    Diagnostic tests  CT scan L knee report 07/18/2019 "IMPRESSION: 1. 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 2. Partial healing of the impacted fracture of the proximal fibula. 3. Moderate knee joint effusion."    Patient Stated Goals  Able to walk without AD    Currently in Pain?  No/denies    Pain Onset  1 to 4 weeks ago      OBJECTIVE:  PROM Knee  Flexion: L [id number] (prior to stretching) 120*(following manual stretching)   Extension:-10 (prior to stretch) 0*(after stretch/manual) *indicates pain  AROM Knee  Flexion: 112 (prior to manual techniques for flexion)  Extension: Lacking 24 degrees prior to manual,  Lacking 20 after manual *indicates pain   TREATMENT:  Bracedoffed during session  Therapeutic exercise:to centralize symptoms and improve ROM, strength, muscular endurance, and activity tolerance required for successful completion of functional activities.  - supine L knee extension stretch with intermittent quad set heel elevated, toes strapped to maintain neutral, 10# ankle  weight over knee. x4 min. Improved from lacking 10degrees to nearly -5 degrees.  -very short arc quad overfoam roll with [place] NMES estim. On/Off4/12seconds to startup to 3m intensity (to pt tolerance with palpable contraction).With active quad contraction. burst frequency 65 bps, duty cycle 50%, ramp 5 second. 2"x4" oval pads placed at proximal and distal L quad.8 min of active running with extra time forset up for estim and and removal of pads. Use of R quad set as well for contralateral neurologic overflow.  Lacking 20 degrees in active extension with estimbut continues more robust contraction this session compared to last and no pain as last time.  - bed mobility using L leg muscles without use of BUE  - assisted pt with donning brace at end of session.  Manual therapy:to reduce pain and tissue tension, improve range of motion, neuromodulation, in order to promote improved ability to complete functional activities.  - manual assessment of L proximal tibial stability: continues to have more laxity in all directions compared to R tibia, but not as much as prior to ORIF.   - supine L knee extension withmanual overpressure, heel elevated, grade III-IV oscillations and sustained pressure as tolerated at knee. Several minutes progressed to 0 degrees extension and unable to get finger under back of knee flat on plinth.   - hooklying  PA joint mobilizations (tibia on femur)  grades II-III at L tibiofibular joint to improve motion.  - hooklying L knee flexion over clinician forearm behind knee and braced on contralateral knee with OP as tolerated.   - supine PROM flexion with oscillatory and sustained overpressure at end range as tolerated. Several minutes progressing from 117 degrees to 120 by end of manual.   - supine L ankle AP and PA talocrual joint mobilizations grade III-IV with dorsiflexion stretch to improve ROM for ambulation in the future.    HOME EXERCISE  PROGRAM Access Code: [email or web address] URL: [email or web address] Date: 08/14/2019  Prepared by: SRosita Kea  Exercises Sidelying Hip Abduction - 3 sets - 10-12 reps - 1x daily - 7x weekly Clamshell with Resistance - 2-3 sets - 10 reps - 1x daily - 7x weekly Supine Heel Slide with Strap - 2 sets - 10-20 reps - 5 seconds hold - 2x daily - 7x weekly Supine Quad Set on Towel Roll - 2 sets - 20 reps - 5 seconds hold - 2x daily    PT Education - 09/05/19 1249    Education Details  exercise purpose/form. self management techniques.    Person(s) Educated  Patient    Methods  Explanation;Demonstration;Verbal cues    Comprehension  Verbalized understanding;Returned demonstration;Verbal cues required;Need further instruction       PT Short Term Goals - 08/14/19 1328      PT SHORT TERM GOAL #1   Title  Be independent with initial home exercise program for self-management of symptoms.    Baseline  initial HEP provided at IE (04/18/2019);    Time  2    Period  Weeks    Status  Achieved    Target Date  05/02/19        PT Long Term Goals - 08/24/19 1912      PT LONG TERM GOAL #1   Title  Be independent with a long-term home exercise program for self-management of symptoms.    Baseline  initial HEP provided at IE (04/18/2019); continues to participate in currently appropriate HEP (05/18/2019; 06/29/2019); updated HEP as appropriate for post-op rehab (08/14/2019); currently participating in appropriate HEP, has not yet received final long term HEP (08/24/2019);    Time  12    Period  Weeks    Status  Partially Met    Target Date  11/06/19      PT LONG TERM GOAL #2   Title  Demonstrate improved FOTO score by 10 units to demonstrate improvement in overall condition and self-reported functional ability.    Baseline  44 (04/18/2019); 52 (05/18/2019); 52 (06/29/2019); FOTO = 40 (08/14/2019);    Time  12    Period  Weeks    Status  On-going    Target Date  11/06/19      PT  LONG TERM GOAL #3   Title  Complete community, work and/or recreational activities without limitation due to current condition.    Baseline  Difficulites with walking, standing, shopping, driving, cooking and bathing, currently NWB L LE (04/18/2019); continues with similar difficulties but is now PWB25% and is able to drive, cross legs to don shoes (05/18/2019); improving but continues to have limitations and cannot perform activities that require single leg stance full weight bearing (06/29/2019); currently limited similarly to intial eval due to recent surgery 08/03/2019 to repair non-union fracture (08/14/2019); continues to have similar limitations (08/24/2019);    Time  12    Period  Weeks    Status  On-going    Target Date  11/06/19      PT LONG TERM GOAL #4   Title  Increase L knee flexion AROM to 140 degrees to be able to complete ADLs without limitation of knee flexion    Baseline  PROM 95 degrees of flexion with pain at end range and AROM 80 degrees(04/18/2019); AAROM 110 with end range pain (05/18/2019); PROM 0-126, AROM ext -25, AAROM flex 115 (06/29/2019); PROM lacking 10 degrees extension, up to 100 degrees flexion (with brace on) (08/14/2019); PROM extension lacking ~ 2 degrees, PROM flexion: 110 degrees (08/24/2019);    Time  12    Period  Weeks    Status  Partially Met    Target Date  11/06/19      PT LONG TERM GOAL #5   Title  Pt will increase LE strength to equal or greater than 4+/5 MMT in order to demonstrate improvement in strength and function.    Baseline  See objectives on IE(04/18/2019); still 2/10 L knee extension (lacking last 22 degrees against gravity) (05/18/2019); continues to lack terminal knee extension in open change position, improved to at least 4/5 with discomfort within available range (06/29/2019); has decreased in since last session due to recent surgery and away from PT 2 weeks (08/14/2019); 25 degree extensor lag (08/24/2019);    Time  12    Period  Weeks     Status  On-going    Target Date  11/06/19            Plan - 09/05/19 1539    Clinical Impression Statement  Patient tolerated treatment well overall with some difficulty due to end range discomfort during joint mobility and stretching activities. Continues to find NMES/Russian estim fatiguing but is showing improved quad contraction. Is making gradual progress in knee extension over time, with slight carry over noted for extension and flexion. Continues to lack adequate strength and mobility to perform SLR without extensor lag. Patient would benefit from continued management of limiting condition by skilled physical therapist to address remaining impairments and functional limitations to work towards stated goals and return to PLOF or maximal functional independence.    Personal Factors and Comorbidities  Comorbidity 2;Age    Comorbidities  L ankle surgery; hypertension; history of falling; L knee pain.    Examination-Activity Limitations  Bathing;Bed Mobility;Locomotion Level;Stairs;Stand;Hygiene/Grooming;Bend;Caring for Others;Sleep;Lift;Transfers;Toileting;Squat;Carry    Examination-Participation Restrictions  Yard Work;Driving;Meal Prep;Laundry;Shop;Cleaning    Stability/Clinical Decision Making  Evolving/Moderate complexity    Rehab Potential  Good    PT Frequency  2x / week    PT Duration  12 weeks    PT Treatment/Interventions  ADLs/Self Care Home Management;Cryotherapy;Moist Heat;Gait training;Stair training;Functional mobility training;Therapeutic activities;Therapeutic exercise;Balance training;Neuromuscular re-education;Manual techniques;Joint Manipulations;Passive range of motion;Dry needling;DME Instruction    PT Next Visit Plan  Strengthening, ROM and muscle endurance, gait training when appropriate    PT Home Exercise Plan  Medbridge: [email or web address]    Consulted and Agree with Plan of Care  Patient       Patient will benefit from skilled therapeutic intervention in order to  improve the following deficits and impairments:  Abnormal gait, Decreased endurance, Decreased activity tolerance, Decreased range of motion, Decreased strength, Pain, Impaired flexibility, Difficulty walking, Decreased balance, Impaired perceived functional ability, Decreased knowledge of use of DME, Decreased mobility  Visit Diagnosis: Acute pain of left knee  Unsteadiness on feet  Muscle weakness (generalized)     Problem List Patient Active Problem List   Diagnosis Date Noted  . Hypertension   . Hyperlipidemia   . Asthma   . Tibial plateau fracture, left, closed, with nonunion, subsequent encounter 08/03/2019    Everlean Alstrom. Graylon Good, PT, DPT 09/05/19, 3:40 PM  [facility] [address], [place], [address] Phone: [phone]   Fax:  [phone]  Name: ANWAR SAKATA MRN: [id number] Date of Birth: 07/19/1945

## 2019-09-06 DIAGNOSIS — S82142K Displaced bicondylar fracture of left tibia, subsequent encounter for closed fracture with nonunion: Secondary | ICD-10-CM | POA: Diagnosis not present

## 2019-09-06 DIAGNOSIS — S82102K Unspecified fracture of upper end of left tibia, subsequent encounter for closed fracture with nonunion: Secondary | ICD-10-CM | POA: Diagnosis not present

## 2019-09-07 ENCOUNTER — Encounter: Payer: Self-pay | Admitting: Physical Therapy

## 2019-09-07 ENCOUNTER — Ambulatory Visit: Payer: PPO | Admitting: Physical Therapy

## 2019-09-07 ENCOUNTER — Other Ambulatory Visit: Payer: Self-pay

## 2019-09-07 DIAGNOSIS — M25562 Pain in left knee: Secondary | ICD-10-CM | POA: Diagnosis not present

## 2019-09-07 DIAGNOSIS — R2681 Unsteadiness on feet: Secondary | ICD-10-CM

## 2019-09-07 DIAGNOSIS — M6281 Muscle weakness (generalized): Secondary | ICD-10-CM

## 2019-09-07 NOTE — Therapy (Signed)
[facility] [address], [place], [address] Phone: [phone]   Fax:  [phone]  Physical Therapy Treatment  Patient Details  Name: Glenn Watson MRN: [id number] Date of Birth: Nov 13, 1945 Referring Provider (PT): Altamese Kiln, MD   Encounter Date: 09/07/2019  PT End of Session - 09/07/19 1555    Visit Number  34    Number of Visits  37    Date for PT Re-Evaluation  11/06/19    Authorization Type  [employer] reporting period from 08/29/2019    Authorization Time Period  N/A    Authorization - Visit Number  4    Authorization - Number of Visits  10    Progress Note Due on Visit  40    PT Start Time  1120    PT Stop Time  1200    PT Time Calculation (min)  40 min    Activity Tolerance  Patient tolerated treatment well    Behavior During Therapy  [facility] for tasks assessed/performed       Past Medical History:  Diagnosis Date  . Anemia   . Asthma    as a child, exercise asthma - none since high school"  . Cancer ([place])    skin cancer - basal, squamous- Mylenoma - stage 2- chest  . Complication of anesthesia    "very sensitive to medications" "Had colonscopy with just Draminine"  . Family history of adverse reaction to anesthesia    daughter- N/V  . Hyperlipidemia   . Hypertension   . PONV (postoperative nausea and vomiting)     Past Surgical History:  Procedure Laterality Date  . APPENDECTOMY  2011   Dr. Bary Castilla  . COLONOSCOPY WITH PROPOFOL N/A 02/10/2017   Procedure: COLONOSCOPY WITH PROPOFOL;  Surgeon: Christene Lye, MD;  Location: [facility] ENDOSCOPY;  Service: Endoscopy;  Laterality: N/A;  . ORIF TIBIA PLATEAU Left 08/03/2019   Procedure: OPEN REDUCTION INTERNAL FIXATION (ORIF) TIBIAL PLATEAU;  Surgeon: Altamese , MD;  Location: [place];  Service: Orthopedics;  Laterality: Left;    There were no vitals filed for this visit.  Subjective Assessment - 09/07/19 1138     Subjective  Patient reports no pain upon arrival. Had a bit of soreness yesterday after a follow up visit with Dr. Carlean Jews PA Ainsley Spinner used localized anesthetic and extracted a screw that had come lose and was the cause of the tender bump at his later L proximal tibia. He states that x-rays showed healing and that the entire focus of the visit turned to getting the screw out after the x-rays. He states the PA was surprised the self-locking screw came out but was not concerned about it because he has 7 more screws in there holding things sturdy. Advised him that he is to be NWB for 3 more weeks, then can start progressive weight bearing over 2 weeks until he reaches full weight bearing. Disconitnued the brace until he starts weight bearing. He has his next follow up 3/31, which is when they expect him to start weight bearing. Patient demonstrated good extension and ability to lift his leg without UE while in veiw of the PA and the PA did not say anything about his ROM.    Patient is accompained by:  Family member    Pertinent History  L ankle surgery; hypertension; history of falling; L knee pain.    Limitations  Lifting;Standing;Walking;House hold activities    How long can you  stand comfortably?  can do dishes    How long can you walk comfortably?  can only go a small ways inside the home or from car to clinic before he needs to rest due to fatigue    Diagnostic tests  CT scan L knee report 07/18/2019 "IMPRESSION: 1. 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 2. Partial healing of the impacted fracture of the proximal fibula. 3. Moderate knee joint effusion."    Patient Stated Goals  Able to walk without AD    Currently in Pain?  No/denies    Pain Onset  1 to 4 weeks ago         OBJECTIVE:  PROM Knee  Flexion: L [id number] (prior to stretching)122*(following manual stretching)  Extension:-10 (prior to stretch) 0*(after stretch/ light manual) *indicates  pain  AROM Knee  Flexion: 105 (prior to manual techniques for flexion)  Extension:Lacking 25degrees prior to stretch and light manual,  Lacking 15 after estim, stretch and light manual.  *indicates pain   TREATMENT: NWB until at least 3/31 (next MD appt)  Therapeutic exercise:to centralize symptoms and improve ROM, strength, muscular endurance, and activity tolerance required for successful completion of functional activities.  - supine L knee extension stretch with intermittent quad set heel elevated, toes strapped to maintain neutral, 10# ankle weight over knee. x4 min. Improved from lacking10degrees to nearly0degrees.  -very short arc quad overfoam roll with [place] NMES estim. On/Off4/12seconds to startup to45m intensity (to pt tolerance with palpable contraction).With active quad contraction. burst frequency 65 bps, duty cycle 50%, ramp 5 second. 2"x4" oval pads placed at proximal and distal L quad.5 min of active running with extra time forset up for estim and and removal of pads. Use of R quad set as well for contralateral neurologic overflow.Lacking 20degrees in active extension with estimbutcontinuesmore robust contraction this session compared to last and no pain as last time.  - bed mobility using L leg muscles without use of BUE  - educated pt on use of strap for L ankle DF stretch at home.    Manual therapy:to reduce pain and tissue tension, improve range of motion, neuromodulation, in order to promote improved ability to complete functional activities.  - supine L knee extension withmanual overpressure, heel elevated, grade III-IV oscillations and sustained pressure as tolerated at knee. Several minutes progressed to0degrees extension.  - hooklying  PA and AP joint mobilizations (tibia on femur)  grades III-IV at L tibiofibular joint to improve motion. - hooklying L knee flexion over clinician forearm behind knee and braced  on contralateral knee with OP as tolerated.  - supine PROM flexion with oscillatory and sustained overpressure at end range as tolerated. Several minutes progressing from 113degrees to 122 by end of manual.  - supine L ankle AP and PA talocrual joint mobilizations grade III-IV with dorsiflexion stretch to improve ROM for ambulation in the future.  - supine PROM L ankle dorsiflexion/gastroc stretch with mild knee extension OP 3x30 seconds, x15 seconds.    HOME EXERCISE PROGRAM Access Code: [email or web address] URL: [email or web address] Date: 08/14/2019  Prepared by: SRosita Kea  Exercises Sidelying Hip Abduction - 3 sets - 10-12 reps - 1x daily - 7x weekly Clamshell with Resistance - 2-3 sets - 10 reps - 1x daily - 7x weekly Supine Heel Slide with Strap - 2 sets - 10-20 reps - 5 seconds hold - 2x daily - 7x weekly Supine Quad Set on Towel Roll - 2 sets - 20 reps - 5  seconds hold - 2x daily    PT Education - 09/07/19 1555    Education Details  exercise purpose/form. self management techniques.    Person(s) Educated  Patient    Methods  Explanation;Demonstration;Tactile cues;Verbal cues    Comprehension  Verbalized understanding;Returned demonstration;Verbal cues required;Tactile cues required;Need further instruction       PT Short Term Goals - 08/14/19 1328      PT SHORT TERM GOAL #1   Title  Be independent with initial home exercise program for self-management of symptoms.    Baseline  initial HEP provided at IE (04/18/2019);    Time  2    Period  Weeks    Status  Achieved    Target Date  05/02/19        PT Long Term Goals - 08/24/19 1912      PT LONG TERM GOAL #1   Title  Be independent with a long-term home exercise program for self-management of symptoms.    Baseline  initial HEP provided at IE (04/18/2019); continues to participate in currently appropriate HEP (05/18/2019; 06/29/2019); updated HEP as appropriate for post-op rehab (08/14/2019); currently  participating in appropriate HEP, has not yet received final long term HEP (08/24/2019);    Time  12    Period  Weeks    Status  Partially Met    Target Date  11/06/19      PT LONG TERM GOAL #2   Title  Demonstrate improved FOTO score by 10 units to demonstrate improvement in overall condition and self-reported functional ability.    Baseline  44 (04/18/2019); 52 (05/18/2019); 52 (06/29/2019); FOTO = 40 (08/14/2019);    Time  12    Period  Weeks    Status  On-going    Target Date  11/06/19      PT LONG TERM GOAL #3   Title  Complete community, work and/or recreational activities without limitation due to current condition.    Baseline  Difficulites with walking, standing, shopping, driving, cooking and bathing, currently NWB L LE (04/18/2019); continues with similar difficulties but is now PWB25% and is able to drive, cross legs to don shoes (05/18/2019); improving but continues to have limitations and cannot perform activities that require single leg stance full weight bearing (06/29/2019); currently limited similarly to intial eval due to recent surgery 08/03/2019 to repair non-union fracture (08/14/2019); continues to have similar limitations (08/24/2019);    Time  12    Period  Weeks    Status  On-going    Target Date  11/06/19      PT LONG TERM GOAL #4   Title  Increase L knee flexion AROM to 140 degrees to be able to complete ADLs without limitation of knee flexion    Baseline  PROM 95 degrees of flexion with pain at end range and AROM 80 degrees(04/18/2019); AAROM 110 with end range pain (05/18/2019); PROM 0-126, AROM ext -25, AAROM flex 115 (06/29/2019); PROM lacking 10 degrees extension, up to 100 degrees flexion (with brace on) (08/14/2019); PROM extension lacking ~ 2 degrees, PROM flexion: 110 degrees (08/24/2019);    Time  12    Period  Weeks    Status  Partially Met    Target Date  11/06/19      PT LONG TERM GOAL #5   Title  Pt will increase LE strength to equal or greater than  4+/5 MMT in order to demonstrate improvement in strength and function.    Baseline  See objectives on  IE(04/18/2019); still 2/10 L knee extension (lacking last 22 degrees against gravity) (05/18/2019); continues to lack terminal knee extension in open change position, improved to at least 4/5 with discomfort within available range (06/29/2019); has decreased in since last session due to recent surgery and away from PT 2 weeks (08/14/2019); 25 degree extensor lag (08/24/2019);    Time  12    Period  Weeks    Status  On-going    Target Date  11/06/19            Plan - 09/07/19 1554    Clinical Impression Statement  Patient tolerated treatment well and was able to gain L knee extension and flexion ROM relatively easily today. Educated pt on importance of keeping covered wound dry for infection prevention. Patient continues to have slightly stronger L quad contraction, only lacking 15 degrees this session. Per pt report injury appears to be healing and referring physician's PA did have concerns about pulling out one screw that came loose. Plan to continue with treatments targeted at improving L knee and ankle ROM and open chain quad strength at future visits. Patient would benefit from continued management of limiting condition by skilled physical therapist to address remaining impairments and functional limitations to work towards stated goals and return to PLOF or maximal functional independence.    Personal Factors and Comorbidities  Comorbidity 2;Age    Comorbidities  L ankle surgery; hypertension; history of falling; L knee pain.    Examination-Activity Limitations  Bathing;Bed Mobility;Locomotion Level;Stairs;Stand;Hygiene/Grooming;Bend;Caring for Others;Sleep;Lift;Transfers;Toileting;Squat;Carry    Examination-Participation Restrictions  Yard Work;Driving;Meal Prep;Laundry;Shop;Cleaning    Stability/Clinical Decision Making  Evolving/Moderate complexity    Rehab Potential  Good    PT Frequency   2x / week    PT Duration  12 weeks    PT Treatment/Interventions  ADLs/Self Care Home Management;Cryotherapy;Moist Heat;Gait training;Stair training;Functional mobility training;Therapeutic activities;Therapeutic exercise;Balance training;Neuromuscular re-education;Manual techniques;Joint Manipulations;Passive range of motion;Dry needling;DME Instruction    PT Next Visit Plan  Strengthening, ROM and muscle endurance, gait training when appropriate    PT Home Exercise Plan  Medbridge: [email or web address]    Consulted and Agree with Plan of Care  Patient       Patient will benefit from skilled therapeutic intervention in order to improve the following deficits and impairments:  Abnormal gait, Decreased endurance, Decreased activity tolerance, Decreased range of motion, Decreased strength, Pain, Impaired flexibility, Difficulty walking, Decreased balance, Impaired perceived functional ability, Decreased knowledge of use of DME, Decreased mobility  Visit Diagnosis: Acute pain of left knee  Unsteadiness on feet  Muscle weakness (generalized)     Problem List Patient Active Problem List   Diagnosis Date Noted  . Hypertension   . Hyperlipidemia   . Asthma   . Tibial plateau fracture, left, closed, with nonunion, subsequent encounter 08/03/2019    Everlean Alstrom. Graylon Good, PT, DPT 09/07/19, 3:57 PM  [place] [facility] [address] [place], [address] Phone: [phone]   Fax:  [phone]  Name: Glenn Watson MRN: [id number] Date of Birth: Oct 17, 1945

## 2019-09-12 ENCOUNTER — Encounter: Payer: Self-pay | Admitting: Physical Therapy

## 2019-09-12 ENCOUNTER — Other Ambulatory Visit: Payer: Self-pay

## 2019-09-12 ENCOUNTER — Ambulatory Visit: Payer: PPO | Admitting: Physical Therapy

## 2019-09-12 DIAGNOSIS — M25562 Pain in left knee: Secondary | ICD-10-CM | POA: Diagnosis not present

## 2019-09-12 DIAGNOSIS — M6281 Muscle weakness (generalized): Secondary | ICD-10-CM

## 2019-09-12 DIAGNOSIS — R2681 Unsteadiness on feet: Secondary | ICD-10-CM

## 2019-09-12 NOTE — Therapy (Signed)
[facility] [address] [place], [address] Phone: [phone]   Fax:  [phone]  Physical Therapy Treatment  Patient Details  Name: Glenn Watson MRN: [id number] Date of Birth: Jun 02, 1946 Referring Provider (PT): Altamese Crane, MD   Encounter Date: 09/12/2019  PT End of Session - 09/12/19 1134    Visit Number  35    Number of Visits  45    Date for PT Re-Evaluation  11/06/19    Authorization Type  [employer] reporting period from 08/29/2019    Authorization Time Period  N/A    Authorization - Visit Number  5    Authorization - Number of Visits  10    Progress Note Due on Visit  40    PT Start Time  1120    PT Stop Time  1200    PT Time Calculation (min)  40 min    Activity Tolerance  Patient tolerated treatment well    Behavior During Therapy  [facility] for tasks assessed/performed       Past Medical History:  Diagnosis Date  . Anemia   . Asthma    as a child, exercise asthma - none since high school"  . Cancer ([place])    skin cancer - basal, squamous- Mylenoma - stage 2- chest  . Complication of anesthesia    "very sensitive to medications" "Had colonscopy with just Draminine"  . Family history of adverse reaction to anesthesia    daughter- N/V  . Hyperlipidemia   . Hypertension   . PONV (postoperative nausea and vomiting)     Past Surgical History:  Procedure Laterality Date  . APPENDECTOMY  2011   Dr. Bary Castilla  . COLONOSCOPY WITH PROPOFOL N/A 02/10/2017   Procedure: COLONOSCOPY WITH PROPOFOL;  Surgeon: Christene Lye, MD;  Location: [facility] END[facility];  Service: Endoscopy;  Laterality: N/A;  . ORIF TIBIA PLATEAU Left 08/03/2019   Procedure: OPEN REDUCTION INTERNAL FIXATION (ORIF) TIBIAL PLATEAU;  Surgeon: Altamese Yoakum, MD;  Location: [facility];  Service: Orthopedics;  Laterality: Left;    There were no vitals filed for this visit.  Subjective Assessment - 09/12/19 1122    Subjective  Patient reports no pain upon arrival. He found some 5# dumbbells that he has been using together (2) to weigh down his L knee with his heel propped up at home and feels this is working really well to get it straight. Back bothers him some in the morning but he does his leg lifts and this really helps. No excessive soreness or pain following last treatment session. Residual soreness went away in < 1 hour. Has not had to take tylenol for the last several days. Does take dramamine at night because it helps him rest.    Patient is accompained by:  Family member    Pertinent History  L ankle surgery; hypertension; history of falling; L knee pain.    Limitations  Lifting;Standing;Walking;House hold activities    How long can you stand comfortably?  can do dishes    How long can you walk comfortably?  can only go a small ways inside the home or from car to clinic before he needs to rest due to fatigue    Diagnostic tests  CT scan L knee report 07/18/2019 "IMPRESSION: 1. 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 2. Partial healing of the impacted fracture of the proximal fibula. 3. Moderate knee joint effusion."    Patient Stated  Goals  Able to walk without AD    Currently in Pain?  No/denies    Pain Onset  1 to 4 weeks ago       OBJECTIVE:  PROM Knee  Flexion: L [id number](BLTJQ to stretching)122*(following manual stretching) - last measured 09/07/19  Extension:-10 (prior to stretch) 0*(after stretch/ light manual) *indicates pain  AROM Knee  Flexion: 105(prior to manualtechniques for flexion)- last measured 09/07/19  Extension:Lacking 25degreesprior to stretch and light manual, Lacking 20 after estim, stretch and light manual.  *indicates pain   TREATMENT: NWB until at least 3/31 (next MD appt)  Therapeutic exercise:to centralize symptoms and improve ROM, strength, muscular endurance, and activity tolerance required  for successful completion of functional activities.  - supine L knee extension stretch with intermittent quad set heel elevated, toes strapped to maintain neutral, 10# ankle weight over knee. x4 min. Improved from lacking10degrees to nearly0degrees.  -very short arc quad overfoam roll, then quad set to A SLR with roll under ankle with [place] NMES estim. On/Off4/12seconds to startup to56m intensity (to pt tolerance with palpable contraction).With active quad contraction. burst frequency70bps, duty cycle 50%, ramp 5 second. 2"x4" oval pads placed at proximal and distal L quad.5 min of active running time in each position with extra time forset up for estim and and removal of pads. Use of R quad set as well for contralateral neurologic overflow.Lacking 20degrees in active extension with estimbutcontinuesmore robust contraction this session compared to last and no pain as last time.  - bed mobility using L leg muscles without use of BUE - seated long arc quad with 7.5# ankle weight. 4x10.   Manual therapy:to reduce pain and tissue tension, improve range of motion, neuromodulation, in order to promote improved ability to complete functional activities. - supine L knee extension withmanual overpressure, heel elevated, grade III-IV oscillations and sustained pressure as tolerated at knee. progressed to0degrees extension.  -seated L knee flexion with clinician OP as tolerated x10    HOME EXERCISE PROGRAM Access Code: [email or web address] URL: [email or web address] Date: 08/14/2019  Prepared by: SRosita Kea  Exercises Sidelying Hip Abduction - 3 sets - 10-12 reps - 1x daily - 7x weekly Clamshell with Resistance - 2-3 sets - 10 reps - 1x daily - 7x weekly Supine Heel Slide with Strap - 2 sets - 10-20 reps - 5 seconds hold - 2x daily - 7x weekly Supine Quad Set on Towel Roll - 2 sets - 20 reps - 5 seconds hold - 2x daily    PT Education - 09/12/19  1124    Education Details  exercise purpose/form. self management techniques.    Person(s) Educated  Patient    Methods  Explanation;Demonstration;Tactile cues;Verbal cues    Comprehension  Need further instruction;Verbalized understanding;Returned demonstration;Verbal cues required       PT Short Term Goals - 08/14/19 1328      PT SHORT TERM GOAL #1   Title  Be independent with initial home exercise program for self-management of symptoms.    Baseline  initial HEP provided at IE (04/18/2019);    Time  2    Period  Weeks    Status  Achieved    Target Date  05/02/19        PT Long Term Goals - 08/24/19 1912      PT LONG TERM GOAL #1   Title  Be independent with a long-term home exercise program for self-management of symptoms.    Baseline  initial HEP provided at IE (04/18/2019); continues  to participate in currently appropriate HEP (05/18/2019; 06/29/2019); updated HEP as appropriate for post-op rehab (08/14/2019); currently participating in appropriate HEP, has not yet received final long term HEP (08/24/2019);    Time  12    Period  Weeks    Status  Partially Met    Target Date  11/06/19      PT LONG TERM GOAL #2   Title  Demonstrate improved FOTO score by 10 units to demonstrate improvement in overall condition and self-reported functional ability.    Baseline  44 (04/18/2019); 52 (05/18/2019); 52 (06/29/2019); FOTO = 40 (08/14/2019);    Time  12    Period  Weeks    Status  On-going    Target Date  11/06/19      PT LONG TERM GOAL #3   Title  Complete community, work and/or recreational activities without limitation due to current condition.    Baseline  Difficulites with walking, standing, shopping, driving, cooking and bathing, currently NWB L LE (04/18/2019); continues with similar difficulties but is now PWB25% and is able to drive, cross legs to don shoes (05/18/2019); improving but continues to have limitations and cannot perform activities that require single leg stance  full weight bearing (06/29/2019); currently limited similarly to intial eval due to recent surgery 08/03/2019 to repair non-union fracture (08/14/2019); continues to have similar limitations (08/24/2019);    Time  12    Period  Weeks    Status  On-going    Target Date  11/06/19      PT LONG TERM GOAL #4   Title  Increase L knee flexion AROM to 140 degrees to be able to complete ADLs without limitation of knee flexion    Baseline  PROM 95 degrees of flexion with pain at end range and AROM 80 degrees(04/18/2019); AAROM 110 with end range pain (05/18/2019); PROM 0-126, AROM ext -25, AAROM flex 115 (06/29/2019); PROM lacking 10 degrees extension, up to 100 degrees flexion (with brace on) (08/14/2019); PROM extension lacking ~ 2 degrees, PROM flexion: 110 degrees (08/24/2019);    Time  12    Period  Weeks    Status  Partially Met    Target Date  11/06/19      PT LONG TERM GOAL #5   Title  Pt will increase LE strength to equal or greater than 4+/5 MMT in order to demonstrate improvement in strength and function.    Baseline  See objectives on IE(04/18/2019); still 2/10 L knee extension (lacking last 22 degrees against gravity) (05/18/2019); continues to lack terminal knee extension in open change position, improved to at least 4/5 with discomfort within available range (06/29/2019); has decreased in since last session due to recent surgery and away from PT 2 weeks (08/14/2019); 25 degree extensor lag (08/24/2019);    Time  12    Period  Weeks    Status  On-going    Target Date  11/06/19            Plan - 09/12/19 1207    Clinical Impression Statement  Patient tolerated treatment well and continues to demonstrate improving R quad activation, but is unable to actively extend to 0 degrees. Progressed to A SLR with estim this session and is improved ability to perform but still has significant extensor lag. Able to to progress to LAQ exercise well tolerated. Patient would benefit from continued  management of limiting condition by skilled physical therapist to address remaining impairments and functional limitations to work towards stated goals and  return to PLOF or maximal functional independence.    Personal Factors and Comorbidities  Comorbidity 2;Age    Comorbidities  L ankle surgery; hypertension; history of falling; L knee pain.    Examination-Activity Limitations  Bathing;Bed Mobility;Locomotion Level;Stairs;Stand;Hygiene/Grooming;Bend;Caring for Others;Sleep;Lift;Transfers;Toileting;Squat;Carry    Examination-Participation Restrictions  Yard Work;Driving;Meal Prep;Laundry;Shop;Cleaning    Stability/Clinical Decision Making  Evolving/Moderate complexity    Rehab Potential  Good    PT Frequency  2x / week    PT Duration  12 weeks    PT Treatment/Interventions  ADLs/Self Care Home Management;Cryotherapy;Moist Heat;Gait training;Stair training;Functional mobility training;Therapeutic activities;Therapeutic exercise;Balance training;Neuromuscular re-education;Manual techniques;Joint Manipulations;Passive range of motion;Dry needling;DME Instruction    PT Next Visit Plan  Strengthening, ROM and muscle endurance, gait training when appropriate    PT Home Exercise Plan  Medbridge: [email or web address]    Consulted and Agree with Plan of Care  Patient       Patient will benefit from skilled therapeutic intervention in order to improve the following deficits and impairments:  Abnormal gait, Decreased endurance, Decreased activity tolerance, Decreased range of motion, Decreased strength, Pain, Impaired flexibility, Difficulty walking, Decreased balance, Impaired perceived functional ability, Decreased knowledge of use of DME, Decreased mobility  Visit Diagnosis: Acute pain of left knee  Unsteadiness on feet  Muscle weakness (generalized)     Problem List Patient Active Problem List   Diagnosis Date Noted  . Hypertension   . Hyperlipidemia   . Asthma   . Tibial plateau fracture, left,  closed, with nonunion, subsequent encounter 08/03/2019   Everlean Alstrom. Graylon Good, PT, DPT 09/12/19, 12:09 PM  [place] [address], [place], [address] Phone: [phone]   Fax:  [phone]  Name: ROYALE SWAMY MRN: [id number] Date of Birth: 1946-04-28

## 2019-09-14 ENCOUNTER — Encounter: Payer: Self-pay | Admitting: Physical Therapy

## 2019-09-14 ENCOUNTER — Ambulatory Visit: Payer: PPO | Admitting: Physical Therapy

## 2019-09-14 ENCOUNTER — Other Ambulatory Visit: Payer: Self-pay

## 2019-09-14 DIAGNOSIS — M25562 Pain in left knee: Secondary | ICD-10-CM | POA: Diagnosis not present

## 2019-09-14 DIAGNOSIS — R2681 Unsteadiness on feet: Secondary | ICD-10-CM

## 2019-09-14 DIAGNOSIS — M6281 Muscle weakness (generalized): Secondary | ICD-10-CM

## 2019-09-14 NOTE — Therapy (Signed)
[facility] [place] [facility] [address], [place], [address] Phone: [phone]   Fax:  [phone]  Physical Therapy Treatment  Patient Details  Name: Glenn Watson MRN: [id number] Date of Birth: Mar 25, 1946 Referring Provider (PT): Altamese Capulin, MD   Encounter Date: 09/14/2019  PT End of Session - 09/14/19 1135    Visit Number  36    Number of Visits  86    Date for PT Re-Evaluation  11/06/19    Authorization Type  [employer] reporting period from 08/29/2019    Authorization Time Period  N/A    Authorization - Visit Number  6    Authorization - Number of Visits  10    Progress Note Due on Visit  40    PT Start Time  1120    PT Stop Time  1200    PT Time Calculation (min)  40 min    Activity Tolerance  Patient tolerated treatment well    Behavior During Therapy  [facility] for tasks assessed/performed       Past Medical History:  Diagnosis Date  . Anemia   . Asthma    as a child, exercise asthma - none since high school"  . Cancer ([place])    skin cancer - basal, squamous- Mylenoma - stage 2- chest  . Complication of anesthesia    "very sensitive to medications" "Had colonscopy with just Draminine"  . Family history of adverse reaction to anesthesia    daughter- N/V  . Hyperlipidemia   . Hypertension   . PONV (postoperative nausea and vomiting)     Past Surgical History:  Procedure Laterality Date  . APPENDECTOMY  2011   Dr. Bary Castilla  . COLONOSCOPY WITH PROPOFOL N/A 02/10/2017   Procedure: COLONOSCOPY WITH PROPOFOL;  Surgeon: Christene Lye, MD;  Location: [facility];  Service: Endoscopy;  Laterality: N/A;  . ORIF TIBIA PLATEAU Left 08/03/2019   Procedure: OPEN REDUCTION INTERNAL FIXATION (ORIF) TIBIAL PLATEAU;  Surgeon: Altamese [place]es [place]ound, MD;  Location: [facility];  Service: Orthopedics;  Laterality: Left;    There were no vitals filed for this visit.  Subjective Assessment - 09/14/19 1125     Subjective  Patient reports he is feeling well with no pain upon arrival. He feels that hanging weights off his leg is helping with his extension a lot. No extra pain following last treatment session.    Patient is accompained by:  Family member    Pertinent History  L ankle surgery; hypertension; history of falling; L knee pain.    Limitations  Lifting;Standing;Walking;House hold activities    How long can you stand comfortably?  can do dishes    How long can you walk comfortably?  can only go a small ways inside the home or from car to clinic before he needs to rest due to fatigue    Diagnostic tests  CT scan L knee report 07/18/2019 "IMPRESSION: 1. 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 2. Partial healing of the impacted fracture of the proximal fibula. 3. Moderate knee joint effusion."    Patient Stated Goals  Able to walk without AD    Currently in Pain?  No/denies    Pain Onset  1 to 4 weeks ago       OBJECTIVE:  PROM Knee  Flexion: L [id number](BFXOV to stretching)118*(following manual stretching) - last measured 09/14/19  Extension:-10 (prior to stretch) 0*(after stretch/lightmanual) *indicates pain  AROM Knee  Flexion: 105(prior  to [facility] for flexion)- last measured 09/07/19  Extension:Lacking 20degreesprior tostretch and lightmanual, Lacking15after estim, stretch and light manual. *indicates pain   TREATMENT: NWB until at least 3/31 (next MD appt)  Therapeutic exercise:to centralize symptoms and improve ROM, strength, muscular endurance, and activity tolerance required for successful completion of functional activities. - supine L knee extension stretch with intermittent quad set heel elevated, toes strapped to maintain neutral, 10# ankle weight over knee. x5 min. Improved from lacking5degrees to nearly0degrees.  -very short arc quad overfoam roll, then quad set to A SLR with roll  under ankle with [place] NMES estim. On/Off4/12seconds to startup to65-90m intensity (to pt tolerance with palpable contraction).With active quad contraction. burst frequency70bps, duty cycle 50%, ramp 5 second. 2"x4" oval pads placed at proximal and distal L quad.563m of active running time in each position with extra time forset up for estim and and removal of pads.Lacking 15degrees in active extension with estimbutcontinuesmore robust contraction this session compared to last and no pain as last time.  - bed mobility using L leg muscles without use of BUE  - seated long arc quad with 7.5# ankle weight. x50  Manual therapy:to reduce pain and tissue tension, improve range of motion, neuromodulation, in order to promote improved ability to complete functional activities. - supine L knee extension withmanual overpressure, heel elevated, grade III-IV oscillations and sustained pressure as tolerated at knee. progressed to0degrees extension. -supine L knee flexion with clinician OP as tolerated with end range oscillations.  - hooklying L knee AP and PA joint mobilizations grade III-IV.    HOME EXERCISE PROGRAM Access Code: [email or web address] URL: [email or web address] Date: 08/14/2019  Prepared by: SaRosita Kea Exercises Sidelying Hip Abduction - 3 sets - 10-12 reps - 1x daily - 7x weekly Clamshell with Resistance - 2-3 sets - 10 reps - 1x daily - 7x weekly Supine Heel Slide with Strap - 2 sets - 10-20 reps - 5 seconds hold - 2x daily - 7x weekly Supine Quad Set on Towel Roll - 2 sets - 20 reps - 5 seconds hold - 2x daily    PT Education - 09/14/19 1135    Education Details  exercise purpose/form. self management techniques.    Person(s) Educated  Patient    Methods  Explanation;Demonstration;Tactile cues;Verbal cues    Comprehension  Verbalized understanding;Returned demonstration;Verbal cues required;Tactile cues required;Need further  instruction       PT Short Term Goals - 08/14/19 1328      PT SHORT TERM GOAL #1   Title  Be independent with initial home exercise program for self-management of symptoms.    Baseline  initial HEP provided at IE (04/18/2019);    Time  2    Period  Weeks    Status  Achieved    Target Date  05/02/19        PT Long Term Goals - 08/24/19 1912      PT LONG TERM GOAL #1   Title  Be independent with a long-term home exercise program for self-management of symptoms.    Baseline  initial HEP provided at IE (04/18/2019); continues to participate in currently appropriate HEP (05/18/2019; 06/29/2019); updated HEP as appropriate for post-op rehab (08/14/2019); currently participating in appropriate HEP, has not yet received final long term HEP (08/24/2019);    Time  12    Period  Weeks    Status  Partially Met    Target Date  11/06/19      PT LONG TERM GOAL #2  Title  Demonstrate improved FOTO score by 10 units to demonstrate improvement in overall condition and self-reported functional ability.    Baseline  44 (04/18/2019); 52 (05/18/2019); 52 (06/29/2019); FOTO = 40 (08/14/2019);    Time  12    Period  Weeks    Status  On-going    Target Date  11/06/19      PT LONG TERM GOAL #3   Title  Complete community, work and/or recreational activities without limitation due to current condition.    Baseline  Difficulites with walking, standing, shopping, driving, cooking and bathing, currently NWB L LE (04/18/2019); continues with similar difficulties but is now PWB25% and is able to drive, cross legs to don shoes (05/18/2019); improving but continues to have limitations and cannot perform activities that require single leg stance full weight bearing (06/29/2019); currently limited similarly to intial eval due to recent surgery 08/03/2019 to repair non-union fracture (08/14/2019); continues to have similar limitations (08/24/2019);    Time  12    Period  Weeks    Status  On-going    Target Date   11/06/19      PT LONG TERM GOAL #4   Title  Increase L knee flexion AROM to 140 degrees to be able to complete ADLs without limitation of knee flexion    Baseline  PROM 95 degrees of flexion with pain at end range and AROM 80 degrees(04/18/2019); AAROM 110 with end range pain (05/18/2019); PROM 0-126, AROM ext -25, AAROM flex 115 (06/29/2019); PROM lacking 10 degrees extension, up to 100 degrees flexion (with brace on) (08/14/2019); PROM extension lacking ~ 2 degrees, PROM flexion: 110 degrees (08/24/2019);    Time  12    Period  Weeks    Status  Partially Met    Target Date  11/06/19      PT LONG TERM GOAL #5   Title  Pt will increase LE strength to equal or greater than 4+/5 MMT in order to demonstrate improvement in strength and function.    Baseline  See objectives on IE(04/18/2019); still 2/10 L knee extension (lacking last 22 degrees against gravity) (05/18/2019); continues to lack terminal knee extension in open change position, improved to at least 4/5 with discomfort within available range (06/29/2019); has decreased in since last session due to recent surgery and away from PT 2 weeks (08/14/2019); 25 degree extensor lag (08/24/2019);    Time  12    Period  Weeks    Status  On-going    Target Date  11/06/19            Plan - 09/14/19 1251    Clinical Impression Statement  Patient tolerated treatment well and continues to make progress with quad activation and strength. Has not yet returned to full active extension of the knee and continues to be very limited in mobility. Patient would benefit from continued management of limiting condition by skilled physical therapist to address remaining impairments and functional limitations to work towards stated goals and return to PLOF or maximal functional independence.    Personal Factors and Comorbidities  Comorbidity 2;Age    Comorbidities  L ankle surgery; hypertension; history of falling; L knee pain.    Examination-Activity Limitations   Bathing;Bed Mobility;Locomotion Level;Stairs;Stand;Hygiene/Grooming;Bend;Caring for Others;Sleep;Lift;Transfers;Toileting;Squat;Carry    Examination-Participation Restrictions  Yard Work;Driving;Meal Prep;Laundry;Shop;Cleaning    Stability/Clinical Decision Making  Evolving/Moderate complexity    Rehab Potential  Good    PT Frequency  2x / week    PT Duration  12 weeks  PT Treatment/Interventions  ADLs/Self Care Home Management;Cryotherapy;Moist Heat;Gait training;Stair training;Functional mobility training;Therapeutic activities;Therapeutic exercise;Balance training;Neuromuscular re-education;Manual techniques;Joint Manipulations;Passive range of motion;Dry needling;DME Instruction    PT Next Visit Plan  Strengthening, ROM and muscle endurance, gait training when appropriate    PT Home Exercise Plan  Medbridge: [email or web address]    Consulted and Agree with Plan of Care  Patient       Patient will benefit from skilled therapeutic intervention in order to improve the following deficits and impairments:  Abnormal gait, Decreased endurance, Decreased activity tolerance, Decreased range of motion, Decreased strength, Pain, Impaired flexibility, Difficulty walking, Decreased balance, Impaired perceived functional ability, Decreased knowledge of use of DME, Decreased mobility  Visit Diagnosis: Acute pain of left knee  Unsteadiness on feet  Muscle weakness (generalized)     Problem List Patient Active Problem List   Diagnosis Date Noted  . Hypertension   . Hyperlipidemia   . Asthma   . Tibial plateau fracture, left, closed, with nonunion, subsequent encounter 08/03/2019    Everlean Alstrom. Graylon Good, PT, DPT 09/14/19, 12:52 PM  [place] [facility] [address], [place], [address] Phone: [phone]   Fax:  [phone]  Name: Glenn Watson MRN: [id number] Date of Birth: 1945-12-12

## 2019-09-19 ENCOUNTER — Encounter: Payer: Self-pay | Admitting: Physical Therapy

## 2019-09-19 ENCOUNTER — Other Ambulatory Visit: Payer: Self-pay

## 2019-09-19 ENCOUNTER — Ambulatory Visit: Payer: PPO | Admitting: Physical Therapy

## 2019-09-19 DIAGNOSIS — M6281 Muscle weakness (generalized): Secondary | ICD-10-CM

## 2019-09-19 DIAGNOSIS — M25562 Pain in left knee: Secondary | ICD-10-CM

## 2019-09-19 DIAGNOSIS — R2681 Unsteadiness on feet: Secondary | ICD-10-CM

## 2019-09-19 NOTE — Therapy (Signed)
[facility] [address], [place], [address] Phone: [phone]   Fax:  [phone]  Physical Therapy Treatment  Patient Details  Name: Glenn Watson MRN: [id number] Date of Birth: 1945-10-16 Referring Provider (PT): Altamese [place], MD   Encounter Date: 09/19/2019  PT End of Session - 09/19/19 1130    Visit Number  37    Number of Visits  39    Date for PT Re-Evaluation  11/06/19    Authorization Type  [employer] reporting period from 08/29/2019    Authorization Time Period  N/A    Authorization - Visit Number  7    Authorization - Number of Visits  10    Progress Note Due on Visit  45    PT Start Time  1115    PT Stop Time  1200    PT Time Calculation (min)  45 min    Activity Tolerance  Patient tolerated treatment well    Behavior During Therapy  [facility] for tasks assessed/performed       Past Medical History:  Diagnosis Date  . Anemia   . Asthma    as a child, exercise asthma - none since high school"  . Cancer ([place])    skin cancer - basal, squamous- Mylenoma - stage 2- chest  . Complication of anesthesia    "very sensitive to medications" "Had colonscopy with just Draminine"  . Family history of adverse reaction to anesthesia    daughter- N/V  . Hyperlipidemia   . Hypertension   . PONV (postoperative nausea and vomiting)     Past Surgical History:  Procedure Laterality Date  . APPENDECTOMY  2011   Dr. Bary Castilla  . COLONOSCOPY WITH PROPOFOL N/A 02/10/2017   Procedure: COLONOSCOPY WITH PROPOFOL;  Surgeon: Christene Lye, MD;  Location: [facility];  Service: Endoscopy;  Laterality: N/A;  . ORIF TIBIA PLATEAU Left 08/03/2019   Procedure: OPEN REDUCTION INTERNAL FIXATION (ORIF) TIBIAL PLATEAU;  Surgeon: Altamese [place], MD;  Location: [facility];  Service: Orthopedics;  Laterality: Left;    There were no vitals filed for this visit.  Subjective Assessment - 09/19/19 1128    Subjective  Patient reports he feels well today with no pain upon arrival. He had mild soreness for about 1-2 hour following last treatment session, which is usual for him. He has been using weights at home to stretch into extension and discovered his wife has some ankle weights that has made stretching easier. He thinks he got to 0 degrees PROM extension at home. He did 20 min on the bike at the gym this weekend with good success.    Patient is accompained by:  Family member    Pertinent History  L ankle surgery; hypertension; history of falling; L knee pain.    Limitations  Lifting;Standing;Walking;House hold activities    How long can you stand comfortably?  can do dishes    How long can you walk comfortably?  can only go a small ways inside the home or from car to clinic before he needs to rest due to fatigue    Diagnostic tests  CT scan L knee report 07/18/2019 "IMPRESSION: 1. 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 2. Partial healing of the impacted fracture of the proximal fibula. 3. Moderate knee joint effusion."    Patient Stated Goals  Able to walk without AD    Currently in Pain?  No/denies    Pain  Onset  1 to 4 weeks ago       OBJECTIVE:  PROM Knee  Flexion: L kneePROM110(prior to stretching)118*(following manual stretching) - last measured 09/18/19  Extension:-10 (prior to stretch) 0*(after stretch) *indicates pain  AROM Knee  Flexion: 110(prior to manualtechniques for flexion)- last measured 09/19/19  Extension:Lacking 20degreesprior tostretch and lightmanual, Lacking15after estim, stretch. Active SLR 10 degree lag with estim.  *indicates pain   TREATMENT: NWB until at least 3/31 (next MD appt)  Therapeutic exercise:to centralize symptoms and improve ROM, strength, muscular endurance, and activity tolerance required for successful completion of functional activities. - supine L knee extension stretch with  intermittent quad set heel elevated, toes strapped to maintain neutral, 10# ankle weight over knee. x5 min. Improved from lacking5degrees to nearly0degrees.  -very short arc quad overfoam roll, then quad set to A SLR with roll under anklewith [place] NMES estim. On/Off4/12seconds to startup to65-20m intensity (to pt tolerance with palpable contraction).With active quad contraction. burst frequency70bps, duty cycle 50%, ramp 5 second. 2"x4" oval pads placed at proximal and distal L quad.582m of active runningtime in each positionwith extra time forset up for estim and and removal of pads.Lacking 15degrees in active extension with estimbutcontinuesmore robust contraction this session compared to last and no pain as last time.  - bed mobility using L leg muscles without use of BUE - seated long arc quad with 10# ankle weight. 4x10 - Education on HEP including handout   Manual therapy:to reduce pain and tissue tension, improve range of motion, neuromodulation, in order to promote improved ability to complete functional activities. -supine L knee flexion with clinician OP as tolerated with end range oscillations grade II-IV.  - hooklying L knee AP and PA joint mobilizations grade III-IV.    HOME EXERCISE PROGRAM Access Code: [email or web address] URL: [email or web address] Date: 09/19/2019 Prepared by: SaRosita KeaExercises Seated Passive Knee Extension with Weight - 2 x daily - 5 min hold Supine Short Arc Quad - 1 x daily - 2 sets - 10 reps - 5 seconds hold Supine Active Straight Leg Raise - 1 x daily - 2 sets - 10 reps - 1 second hold Seated Long Arc Quad with Ankle Weight - 1 x daily - 3 sets - 10 reps - 1 second hold Hamstring stretch (with strap) - 1 x daily - 3 reps - 30 seconds hold Long Sitting Calf Stretch with Strap - 1 x daily - 3 reps - 30 seconds hold    PT Education - 09/19/19 1130    Education Details  exercise purpose/form. self  management techniques.    Person(s) Educated  Patient    Methods  Explanation;Demonstration;Tactile cues;Verbal cues    Comprehension  Verbalized understanding;Returned demonstration;Verbal cues required;Tactile cues required;Need further instruction       PT Short Term Goals - 08/14/19 1328      PT SHORT TERM GOAL #1   Title  Be independent with initial home exercise program for self-management of symptoms.    Baseline  initial HEP provided at IE (04/18/2019);    Time  2    Period  Weeks    Status  Achieved    Target Date  05/02/19        PT Long Term Goals - 08/24/19 1912      PT LONG TERM GOAL #1   Title  Be independent with a long-term home exercise program for self-management of symptoms.    Baseline  initial HEP provided at IE (04/18/2019); continues to participate in  currently appropriate HEP (05/18/2019; 06/29/2019); updated HEP as appropriate for post-op rehab (08/14/2019); currently participating in appropriate HEP, has not yet received final long term HEP (08/24/2019);    Time  12    Period  Weeks    Status  Partially Met    Target Date  11/06/19      PT LONG TERM GOAL #2   Title  Demonstrate improved FOTO score by 10 units to demonstrate improvement in overall condition and self-reported functional ability.    Baseline  44 (04/18/2019); 52 (05/18/2019); 52 (06/29/2019); FOTO = 40 (08/14/2019);    Time  12    Period  Weeks    Status  On-going    Target Date  11/06/19      PT LONG TERM GOAL #3   Title  Complete community, work and/or recreational activities without limitation due to current condition.    Baseline  Difficulites with walking, standing, shopping, driving, cooking and bathing, currently NWB L LE (04/18/2019); continues with similar difficulties but is now PWB25% and is able to drive, cross legs to don shoes (05/18/2019); improving but continues to have limitations and cannot perform activities that require single leg stance full weight bearing (06/29/2019);  currently limited similarly to intial eval due to recent surgery 08/03/2019 to repair non-union fracture (08/14/2019); continues to have similar limitations (08/24/2019);    Time  12    Period  Weeks    Status  On-going    Target Date  11/06/19      PT LONG TERM GOAL #4   Title  Increase L knee flexion AROM to 140 degrees to be able to complete ADLs without limitation of knee flexion    Baseline  PROM 95 degrees of flexion with pain at end range and AROM 80 degrees(04/18/2019); AAROM 110 with end range pain (05/18/2019); PROM 0-126, AROM ext -25, AAROM flex 115 (06/29/2019); PROM lacking 10 degrees extension, up to 100 degrees flexion (with brace on) (08/14/2019); PROM extension lacking ~ 2 degrees, PROM flexion: 110 degrees (08/24/2019);    Time  12    Period  Weeks    Status  Partially Met    Target Date  11/06/19      PT LONG TERM GOAL #5   Title  Pt will increase LE strength to equal or greater than 4+/5 MMT in order to demonstrate improvement in strength and function.    Baseline  See objectives on IE(04/18/2019); still 2/10 L knee extension (lacking last 22 degrees against gravity) (05/18/2019); continues to lack terminal knee extension in open change position, improved to at least 4/5 with discomfort within available range (06/29/2019); has decreased in since last session due to recent surgery and away from PT 2 weeks (08/14/2019); 25 degree extensor lag (08/24/2019);    Time  12    Period  Weeks    Status  On-going    Target Date  11/06/19            Plan - 09/19/19 1218    Clinical Impression Statement  Patient tolerated treatment well and continues to tolerate quad strengthening and ROM exercises well. Able to focus more on knee flexion this session as patient is getting to full extension more frequently and easily. Patient continues to lack approximately 10 degrees extension during straight leg raise. And 15 degrees with short arc quad. Patient would benefit from continued management  of limiting condition by skilled physical therapist to address remaining impairments and functional limitations to work towards stated goals and  return to PLOF or maximal functional independence.    Personal Factors and Comorbidities  Comorbidity 2;Age    Comorbidities  L ankle surgery; hypertension; history of falling; L knee pain.    Examination-Activity Limitations  Bathing;Bed Mobility;Locomotion Level;Stairs;Stand;Hygiene/Grooming;Bend;Caring for Others;Sleep;Lift;Transfers;Toileting;Squat;Carry    Examination-Participation Restrictions  Yard Work;Driving;Meal Prep;Laundry;Shop;Cleaning    Stability/Clinical Decision Making  Evolving/Moderate complexity    Rehab Potential  Good    PT Frequency  2x / week    PT Duration  12 weeks    PT Treatment/Interventions  ADLs/Self Care Home Management;Cryotherapy;Moist Heat;Gait training;Stair training;Functional mobility training;Therapeutic activities;Therapeutic exercise;Balance training;Neuromuscular re-education;Manual techniques;Joint Manipulations;Passive range of motion;Dry needling;DME Instruction    PT Next Visit Plan  Strengthening, ROM and muscle endurance, gait training when appropriate    PT Home Exercise Plan  Medbridge: [email or web address]    Consulted and Agree with Plan of Care  Patient       Patient will benefit from skilled therapeutic intervention in order to improve the following deficits and impairments:  Abnormal gait, Decreased endurance, Decreased activity tolerance, Decreased range of motion, Decreased strength, Pain, Impaired flexibility, Difficulty walking, Decreased balance, Impaired perceived functional ability, Decreased knowledge of use of DME, Decreased mobility  Visit Diagnosis: Acute pain of left knee  Unsteadiness on feet  Muscle weakness (generalized)     Problem List Patient Active Problem List   Diagnosis Date Noted  . Hypertension   . Hyperlipidemia   . Asthma   . Tibial plateau fracture, left, closed, with  nonunion, subsequent encounter 08/03/2019    Everlean Alstrom. Graylon Good, PT, DPT 09/19/19, 12:18 PM  [facility] [address], [place], [address] Phone: [phone]   Fax:  [phone]  Name: Glenn Watson MRN: [id number] Date of Birth: 1946-06-29

## 2019-09-21 ENCOUNTER — Ambulatory Visit: Payer: PPO | Admitting: Physical Therapy

## 2019-09-21 ENCOUNTER — Other Ambulatory Visit: Payer: Self-pay

## 2019-09-21 DIAGNOSIS — M25562 Pain in left knee: Secondary | ICD-10-CM | POA: Diagnosis not present

## 2019-09-21 DIAGNOSIS — R2681 Unsteadiness on feet: Secondary | ICD-10-CM

## 2019-09-21 DIAGNOSIS — M6281 Muscle weakness (generalized): Secondary | ICD-10-CM

## 2019-09-21 NOTE — Therapy (Signed)
[facility] [address] [place], [address] Phone: [phone]   Fax:  [phone]  Physical Therapy Treatment  Patient Details  Name: Glenn Watson MRN: [id number] Date of Birth: May 21, 1946 Referring Provider (PT): Altamese [place], MD   Encounter Date: 09/21/2019  PT End of Session - 09/21/19 1140    Visit Number  38    Number of Visits  63    Date for PT Re-Evaluation  11/06/19    Authorization Type  [employer] reporting period from 08/29/2019    Authorization Time Period  N/A    Authorization - Visit Number  8    Authorization - Number of Visits  10    Progress Note Due on Visit  69    PT Start Time  1118    PT Stop Time  1200    PT Time Calculation (min)  42 min    Activity Tolerance  Patient tolerated treatment well    Behavior During Therapy  [facility] for tasks assessed/performed       Past Medical History:  Diagnosis Date  . Anemia   . Asthma    as a child, exercise asthma - none since high school"  . Cancer ([place])    skin cancer - basal, squamous- Mylenoma - stage 2- chest  . Complication of anesthesia    "very sensitive to medications" "Had colonscopy with just Draminine"  . Family history of adverse reaction to anesthesia    daughter- N/V  . Hyperlipidemia   . Hypertension   . PONV (postoperative nausea and vomiting)     Past Surgical History:  Procedure Laterality Date  . APPENDECTOMY  2011   Dr. Bary Castilla  . COLONOSCOPY WITH PROPOFOL N/A 02/10/2017   Procedure: COLONOSCOPY WITH PROPOFOL;  Surgeon: Christene Lye, MD;  Location: [facility];  Service: Endoscopy;  Laterality: N/A;  . ORIF TIBIA PLATEAU Left 08/03/2019   Procedure: OPEN REDUCTION INTERNAL FIXATION (ORIF) TIBIAL PLATEAU;  Surgeon: Altamese , MD;  Location: [facility];  Service: Orthopedics;  Laterality: Left;    There were no vitals filed for this visit.  Subjective Assessment - 09/21/19 1141    Subjective  Patient reports no pain upon arrival today. States he was sore for a few hours following last treatment session. Has started LAQ at home without a problem and he thinks he is using 10# and he feels muscle fatigue in his quad.    Patient is accompained by:  Family member    Pertinent History  L ankle surgery; hypertension; history of falling; L knee pain.    Limitations  Lifting;Standing;Walking;House hold activities    How long can you stand comfortably?  can do dishes    How long can you walk comfortably?  can only go a small ways inside the home or from car to clinic before he needs to rest due to fatigue    Diagnostic tests  CT scan L knee report 07/18/2019 "IMPRESSION: 1. 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 2. Partial healing of the impacted fracture of the proximal fibula. 3. Moderate knee joint effusion."    Patient Stated Goals  Able to walk without AD    Currently in Pain?  No/denies    Pain Onset  1 to 4 weeks ago         OBJECTIVE:  PROM Knee  Flexion: L [id number](GOTLX to stretching)122(following manual stretching) - last measured 09/22/19  Extension:-10 (prior to stretch)  0*(after stretch) *indicates pain  AROM Knee  Flexion: 105(prior to manualtechniques for flexion, 110 AAROM)- last measured 09/21/19  Extension:Lacking 20degreesprior tostretch and lightmanual, Lacking15after estim, stretch. Active SLR 15 degree lag with estim.  *indicates pain  MANUAL ASSESSMENT - laxity noted in proximal tibial in sagittal and frontal planes.   TREATMENT: NWB until at least 3/31 (next MD appt)  Therapeutic exercise:to centralize symptoms and improve ROM, strength, muscular endurance, and activity tolerance required for successful completion of functional activities. - supine L knee extension stretch with intermittent quad set heel elevated, toes strapped to maintain neutral, 10# ankle weight over  knee. x33mn. Improved from lacking5degrees to nearly0degrees. -very short arc quad overfoam roll, then quad set to A SLR with [place] estim. On/Off4/12seconds and 5/50 seconds to startup to80-873mintensity (to pt tolerance with palpable contraction).With active quad contraction. burst frequency70bps, duty cycle 50%, ramp 5 second. 2"x4" oval pads placed at proximal and distal L quad.24m3mof active runningtime in each positionwith extra time forset up for estim and and removal of pads.Lacking15degrees in active extension with estimbutcontinuesmore robust contraction this session compared to last and no pain as last time. - bed mobility using L leg muscles without use of BUE  Manual therapy:to reduce pain and tissue tension, improve range of motion, neuromodulation, in order to promote improved ability to complete functional activities. - supine L knee extension PROM with OP 5x10 seconds.  -supineL knee flexion with clinician OP as toleratedwith end range oscillations grade II-IV. Slight PA tibiofemoral mobilization grade III-IV during flexion stretching - assessment of proximal tibial stability - does demonstrate laxity in sagittal and frontal plane.    HOME EXERCISE PROGRAM Access Code: [email or web address] URL: [email or web address] Date: 09/19/2019 Prepared by: SarRosita Keaxercises Seated Passive Knee Extension with Weight - 2 x daily - 5 min hold Supine Short Arc Quad - 1 x daily - 2 sets - 10 reps - 5 seconds hold Supine Active Straight Leg Raise - 1 x daily - 2 sets - 10 reps - 1 second hold Seated Long Arc Quad with Ankle Weight - 1 x daily - 3 sets - 10 reps - 1 second hold Hamstring stretch (with strap) - 1 x daily - 3 reps - 30 seconds hold Long Sitting Calf Stretch with Strap - 1 x daily - 3 reps - 30 seconds hold    PT Education - 09/21/19 1141    Education Details  exercise purpose/form. self management techniques.    Person(s)  Educated  Patient    Methods  Explanation;Demonstration;Tactile cues;Verbal cues    Comprehension  Verbalized understanding;Returned demonstration;Verbal cues required;Need further instruction       PT Short Term Goals - 08/14/19 1328      PT SHORT TERM GOAL #1   Title  Be independent with initial home exercise program for self-management of symptoms.    Baseline  initial HEP provided at [facility] (04/18/2019);    Time  2    Period  Weeks    Status  Achieved    Target Date  05/02/19        PT Long Term Goals - 08/24/19 1912      PT LONG TERM GOAL #1   Title  Be independent with a long-term home exercise program for self-management of symptoms.    Baseline  initial HEP provided at IE (04/18/2019); continues to participate in currently appropriate HEP (05/18/2019; 06/29/2019); updated HEP as appropriate for post-op rehab (08/14/2019); currently participating in appropriate HEP, has not  yet received final long term HEP (08/24/2019);    Time  12    Period  Weeks    Status  Partially Met    Target Date  11/06/19      PT LONG TERM GOAL #2   Title  Demonstrate improved FOTO score by 10 units to demonstrate improvement in overall condition and self-reported functional ability.    Baseline  44 (04/18/2019); 52 (05/18/2019); 52 (06/29/2019); FOTO = 40 (08/14/2019);    Time  12    Period  Weeks    Status  On-going    Target Date  11/06/19      PT LONG TERM GOAL #3   Title  Complete community, work and/or recreational activities without limitation due to current condition.    Baseline  Difficulites with walking, standing, shopping, driving, cooking and bathing, currently NWB L LE (04/18/2019); continues with similar difficulties but is now PWB25% and is able to drive, cross legs to don shoes (05/18/2019); improving but continues to have limitations and cannot perform activities that require single leg stance full weight bearing (06/29/2019); currently limited similarly to intial eval due to recent  surgery 08/03/2019 to repair non-union fracture (08/14/2019); continues to have similar limitations (08/24/2019);    Time  12    Period  Weeks    Status  On-going    Target Date  11/06/19      PT LONG TERM GOAL #4   Title  Increase L knee flexion AROM to 140 degrees to be able to complete ADLs without limitation of knee flexion    Baseline  PROM 95 degrees of flexion with pain at end range and AROM 80 degrees(04/18/2019); AAROM 110 with end range pain (05/18/2019); PROM 0-126, AROM ext -25, AAROM flex 115 (06/29/2019); PROM lacking 10 degrees extension, up to 100 degrees flexion (with brace on) (08/14/2019); PROM extension lacking ~ 2 degrees, PROM flexion: 110 degrees (08/24/2019);    Time  12    Period  Weeks    Status  Partially Met    Target Date  11/06/19      PT LONG TERM GOAL #5   Title  Pt will increase LE strength to equal or greater than 4+/5 MMT in order to demonstrate improvement in strength and function.    Baseline  See objectives on IE(04/18/2019); still 2/10 L knee extension (lacking last 22 degrees against gravity) (05/18/2019); continues to lack terminal knee extension in open change position, improved to at least 4/5 with discomfort within available range (06/29/2019); has decreased in since last session due to recent surgery and away from PT 2 weeks (08/14/2019); 25 degree extensor lag (08/24/2019);    Time  12    Period  Weeks    Status  On-going    Target Date  11/06/19            Plan - 09/21/19 1208    Clinical Impression Statement  Patient tolerated treatment well and was able to make progress in knee flexion. He continues to reach L knee extension more easily each visit. Continues to have extensor lag and laxity noted at proximal tibia. Advised pt to ask about this at next MD follow up on Wednesday. Patient would benefit from continued management of limiting condition by skilled physical therapist to address remaining impairments and functional limitations to work  towards stated goals and return to PLOF or maximal functional independence.    Personal Factors and Comorbidities  Comorbidity 2;Age    Comorbidities  L ankle surgery;  hypertension; history of falling; L knee pain.    Examination-Activity Limitations  Bathing;Bed Mobility;Locomotion Level;Stairs;Stand;Hygiene/Grooming;Bend;Caring for Others;Sleep;Lift;Transfers;Toileting;Squat;Carry    Examination-Participation Restrictions  Yard Work;Driving;Meal Prep;Laundry;Shop;Cleaning    Stability/Clinical Decision Making  Evolving/Moderate complexity    Rehab Potential  Good    PT Frequency  2x / week    PT Duration  12 weeks    PT Treatment/Interventions  ADLs/Self Care Home Management;Cryotherapy;Moist Heat;Gait training;Stair training;Functional mobility training;Therapeutic activities;Therapeutic exercise;Balance training;Neuromuscular re-education;Manual techniques;Joint Manipulations;Passive range of motion;Dry needling;DME Instruction    PT Next Visit Plan  Strengthening, ROM and muscle endurance, gait training when appropriate    PT Home Exercise Plan  Medbridge: [email or web address]    Consulted and Agree with Plan of Care  Patient       Patient will benefit from skilled therapeutic intervention in order to improve the following deficits and impairments:  Abnormal gait, Decreased endurance, Decreased activity tolerance, Decreased range of motion, Decreased strength, Pain, Impaired flexibility, Difficulty walking, Decreased balance, Impaired perceived functional ability, Decreased knowledge of use of DME, Decreased mobility  Visit Diagnosis: Acute pain of left knee  Unsteadiness on feet  Muscle weakness (generalized)     Problem List Patient Active Problem List   Diagnosis Date Noted  . Hypertension   . Hyperlipidemia   . Asthma   . Tibial plateau fracture, left, closed, with nonunion, subsequent encounter 08/03/2019    Everlean Alstrom. Graylon Good, PT, DPT 09/21/19, 12:09 PM  [facility] [address] [place], [address] Phone: [phone]   Fax:  [phone]  Name: Glenn Watson MRN: [id number] Date of Birth: 1946-05-29

## 2019-09-22 ENCOUNTER — Ambulatory Visit: Payer: PPO | Attending: Internal Medicine

## 2019-09-22 DIAGNOSIS — Z23 Encounter for immunization: Secondary | ICD-10-CM

## 2019-09-22 NOTE — Progress Notes (Signed)
   Covid-19 Vaccination Clinic  Name:  Glenn Watson    MRN: [id number] DOB: 1945-08-26  09/22/2019  [name] was observed post Covid-19 immunization for 15 minutes without incident. He was provided with Vaccine Information Sheet and instruction to access the V-Safe system.   [name] was instructed to call 911 with any severe reactions post vaccine: Marland Kitchen Difficulty breathing  . Swelling of face and throat  . A fast heartbeat  . A bad rash all over body  . Dizziness and weakness   Immunizations Administered    Name Date Dose VIS Date Route   Pfizer COVID-19 Vaccine 09/22/2019 10:04 AM 0.3 mL 06/09/2019 Intramuscular   Manufacturer: [place]   Lot: [id number]   [place]: [id number]

## 2019-09-26 ENCOUNTER — Other Ambulatory Visit: Payer: Self-pay

## 2019-09-26 ENCOUNTER — Encounter: Payer: Self-pay | Admitting: Physical Therapy

## 2019-09-26 ENCOUNTER — Ambulatory Visit: Payer: PPO | Admitting: Physical Therapy

## 2019-09-26 DIAGNOSIS — R2681 Unsteadiness on feet: Secondary | ICD-10-CM

## 2019-09-26 DIAGNOSIS — M6281 Muscle weakness (generalized): Secondary | ICD-10-CM

## 2019-09-26 DIAGNOSIS — M25562 Pain in left knee: Secondary | ICD-10-CM | POA: Diagnosis not present

## 2019-09-26 NOTE — Therapy (Addendum)
[facility] [address], [place], [address] Phone: [phone]   Fax:  [phone]  Physical Therapy Treatment / Progress Note Reporting Period: 08/29/2019 - 09/26/2019  Patient Details  Name: Glenn Watson MRN: [id number] Date of Birth: [birth year] Referring Provider (PT): Altamese Adams, MD   Encounter Date: 09/26/2019  PT End of Session - 09/26/19 2025    Visit Number  39    Number of Visits  52    Date for PT Re-Evaluation  11/06/19    Authorization Type  [employer] reporting period from 08/29/2019    Authorization Time Period  N/A    Authorization - Visit Number  9    Authorization - Number of Visits  10    Progress Note Due on Visit  40   NEXT FOTO DUE = 4/13   PT Start Time  1120    PT Stop Time  1205    PT Time Calculation (min)  45 min    Activity Tolerance  Patient tolerated treatment well    Behavior During Therapy  [facility] for tasks assessed/performed       Past Medical History:  Diagnosis Date  . Anemia   . Asthma    as a child, exercise asthma - none since high school"  . Cancer ([place])    skin cancer - basal, squamous- Mylenoma - stage 2- chest  . Complication of anesthesia    "very sensitive to medications" "Had colonscopy with just Draminine"  . Family history of adverse reaction to anesthesia    daughter- N/V  . Hyperlipidemia   . Hypertension   . PONV (postoperative nausea and vomiting)     Past Surgical History:  Procedure Laterality Date  . APPENDECTOMY  2011   Dr. Bary Castilla  . COLONOSCOPY WITH PROPOFOL N/A 02/10/2017   Procedure: COLONOSCOPY WITH PROPOFOL;  Surgeon: Christene Lye, MD;  Location: [facility] END[facility];  Service: Endoscopy;  Laterality: N/A;  . ORIF TIBIA PLATEAU Left 08/03/2019   Procedure: OPEN REDUCTION INTERNAL FIXATION (ORIF) TIBIAL PLATEAU;  Surgeon: Altamese Williamsfield, MD;  Location: [facility];  Service: Orthopedics;  Laterality: Left;    There were no  vitals filed for this visit.  Subjective Assessment - 09/26/19 2023    Subjective  Patient reports he is feeling well today and has no pain upon arrival. States he has been continuing to use the weights to stretch his L knee into extension and using a strap to complete gastroc stretch at the same time. He is also using ankle weights to complet long arc quad knee extensions at home successfully. States he was a bit sore following last treatment but only for a short time. He is really hoping and praying for a good result of his visit with Dr. Marcelino Scot tomorrow.    Patient is accompained by:  Family member    Pertinent History  L ankle surgery; hypertension; history of falling; L knee pain.    Limitations  Lifting;Standing;Walking;House hold activities    How long can you stand comfortably?  can do dishes    How long can you walk comfortably?  can only go a small ways inside the home or from car to clinic before he needs to rest due to fatigue    Diagnostic tests  CT scan L knee report 07/18/2019 "IMPRESSION: 1. 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 2. Partial healing of the impacted fracture of the proximal fibula. 3. Moderate knee  joint effusion."    Patient Stated Goals  Able to walk without AD    Currently in Pain?  No/denies    Pain Onset  1 to 4 weeks ago       OBJECTIVE:  FUNCTIONAL OUTCOME MEASURE FOTO = 51  MANUAL ASSESSMENT - mild laxity noted in L proximal tibia in sagittal and frontal planes.   PROM Knee  Flexion: L kneePROM115(prior to stretching)122( following manual stretching)   Extension:-10 (prior to stretch) 0*(after stretch) *indicates pain  AROM Knee  Flexion: 107(prior to manualtechniques for flexion)  Extension:Lacking 20degreesprior toestim, Lacking15after estim, stretch.  *indicates pain   MUSCLE PERFORMANCE (MMT) R/L  4/4 Hip flexion 4/4 Hip abduction  4+/4+ Hip extension 5/3+  Knee  extension 5/4-   Knee flexion; 5/4    Ankle Dorsiflexion 5/4+  Ankle Plantarflexion *indicates pain  Active SLR 15 degree lag with estim.    TREATMENT: NWB until at least 3/31 (next MD appt)  Therapeutic exercise:to centralize symptoms and improve ROM, strength, muscular endurance, and activity tolerance required for successful completion of functional activities.  - supine L knee extension stretch with intermittent quad set heel elevated, toes strapped to maintain neutral, 10# ankle weight over knee. x54mn. Improved from lacking5degrees to nearly0degrees.  -very short arc quad overfoam roll, then quad set to A SLR with [place] estim. On/Off4/12seconds to startup to764mintensity (to pt tolerance with palpable contraction).With active quad contraction. burst frequency70bps, duty cycle 50%, ramp 5 second. 2"x4" oval pads placed at proximal and distal L quad.56m33mof active runningtime in each positionwith extra time forset up for estim and and removal of pads.Lacking15degrees in active extension with estimbutcontinuesmore robust contraction this session.  - bed mobility using L leg muscles without use of BUE  - objective testing to assess progress  Manual therapy:to reduce pain and tissue tension, improve range of motion, neuromodulation, in order to promote improved ability to complete functional activities. - supine L knee extension PROM with OP 5x5 seconds.  -supineL knee flexion with clinician OP as toleratedwith end range oscillationsgrade IIi-IV. - hooklying PA and AP tibiofemoral mobilization grade III-IV  - assessment of L proximal tibial stability - does demonstrate mild laxity in sagittal and frontal plane compared to right side   HOME EXERCISE PROGRAM Access Code: [email or web address] URL: [email or web address] Date: 09/19/2019 Prepared by: SarRosita Keaxercises Seated Passive Knee Extension with Weight - 2 x daily - 5  min hold Supine Short Arc Quad - 1 x daily - 2 sets - 10 reps - 5 seconds hold Supine Active Straight Leg Raise - 1 x daily - 2 sets - 10 reps - 1 second hold Seated Long Arc Quad with Ankle Weight - 1 x daily - 3 sets - 10 reps - 1 second hold Hamstring stretch (with strap) - 1 x daily - 3 reps - 30 seconds hold Long Sitting Calf Stretch with Strap - 1 x daily - 3 reps - 30 seconds hold   PT Education - 09/26/19 2025    Education Details  exercise purpose/form. self management techniques.    Person(s) Educated  Patient    Methods  Explanation;Demonstration;Verbal cues    Comprehension  Verbalized understanding;Returned demonstration;Verbal cues required;Need further instruction       PT Short Term Goals - 08/14/19 1328      PT SHORT TERM GOAL #1   Title  Be independent with initial home exercise program for self-management of symptoms.    Baseline  initial HEP provided at  IE (04/18/2019);    Time  2    Period  Weeks    Status  Achieved    Target Date  05/02/19        PT Long Term Goals - 09/26/19 2032      PT LONG TERM GOAL #1   Title  Be independent with a long-term home exercise program for self-management of symptoms.    Baseline  initial HEP provided at IE (04/18/2019); continues to participate in currently appropriate HEP (05/18/2019; 06/29/2019); updated HEP as appropriate for post-op rehab (08/14/2019); currently participating in appropriate HEP, has not yet received final long term HEP (08/24/2019; 09/26/2019);    Time  12    Period  Weeks    Status  Partially Met    Target Date  11/06/19      PT LONG TERM GOAL #2   Title  Demonstrate improved FOTO score by 10 units to demonstrate improvement in overall condition and self-reported functional ability.    Baseline  44 (04/18/2019); 52 (05/18/2019); 52 (06/29/2019); FOTO = 40 (08/14/2019); 51 (09/26/2019);    Time  12    Period  Weeks    Status  Partially Met    Target Date  11/06/19      PT LONG TERM GOAL #3   Title   Complete community, work and/or recreational activities without limitation due to current condition.    Baseline  Difficulites with walking, standing, shopping, driving, cooking and bathing, currently NWB L LE (04/18/2019); continues with similar difficulties but is now PWB25% and is able to drive, cross legs to don shoes (05/18/2019); improving but continues to have limitations and cannot perform activities that require single leg stance full weight bearing (06/29/2019); currently limited similarly to intial eval due to recent surgery 08/03/2019 to repair non-union fracture (08/14/2019); continues to have similar limitations (08/24/2019); is improving again but is not yet weight bearing that produces similar limitations, able to shower in 30 min independently now (09/26/2019);    Time  12    Period  Weeks    Status  Partially Met    Target Date  11/06/19      PT LONG TERM GOAL #4   Title  Increase L knee flexion AROM to 140 degrees to be able to complete ADLs without limitation of knee flexion    Baseline  PROM 95 degrees of flexion with pain at end range and AROM 80 degrees(04/18/2019); AAROM 110 with end range pain (05/18/2019); PROM 0-126, AROM ext -25, AAROM flex 115 (06/29/2019); PROM lacking 10 degrees extension, up to 100 degrees flexion (with brace on) (08/14/2019); PROM extension lacking ~ 2 degrees, PROM flexion: 110 degrees (08/24/2019); PROM extension to 0 degrees, PROM flexion 122 degrees (09/26/2019);    Time  12    Period  Weeks    Status  Partially Met    Target Date  11/06/19      PT LONG TERM GOAL #5   Title  Pt will increase LE strength to equal or greater than 4+/5 MMT in order to demonstrate improvement in strength and function.    Baseline  See objectives on IE(04/18/2019); still 2/10 L knee extension (lacking last 22 degrees against gravity) (05/18/2019); continues to lack terminal knee extension in open change position, improved to at least 4/5 with discomfort within available  range (06/29/2019); has decreased in since last session due to recent surgery and away from PT 2 weeks (08/14/2019); 25 degree extensor lag (08/24/2019); has 15 degree extensor lag with etim,  MMT improving - see objective (09/26/2019);    Time  12    Period  Weeks    Status  Partially Met    Target Date  11/06/19            Plan - 09/26/19 2039    Clinical Impression Statement  Patient has attended 39 physical therapy treatment sessions this episode of care and is showing progress again towards all of his goals following ORIF surgery after non-union was discovered at his proximal tibia. He is showing improvements in L quad activation and strength, although he still has an extensor lag and is unable to reach end range extension actively. He has improved L knee PROM to 0-122 and is able to complete an active SLR with 15 degree lag. Patient continues to demonstrate slight laxity in proximal L tibia to manual pressure. Patient is currently NWB and awaiting clearance for weight bearing progressions that is dependent on signs of fracture union and proper healing.  Patient continues to demonstrate significant impairments such as deficits in muscle performance (strength/power/endurance), ROM, joint stiffness, tissue integrity, mobility, pain, weight bearing restrictions. These deficits limit the patient ability to perform things such as ADLs, IADLs, social participation, caring for others, engaging in hobbies (house working, cooking, and driving), and impairs his quality of life. Patient would benefit from continued management of limiting condition by skilled physical therapist to address remaining impairments and functional limitations to work towards stated goals and return to PLOF or maximal functional independence    Personal Factors and Comorbidities  Comorbidity 2;Age    Comorbidities  L ankle surgery; hypertension; history of falling; L knee pain.    Examination-Activity Limitations  Bathing;Bed  Mobility;Locomotion Level;Stairs;Stand;Hygiene/Grooming;Bend;Caring for Others;Sleep;Lift;Transfers;Toileting;Squat;Carry    Examination-Participation Restrictions  Yard Work;Driving;Meal Prep;Laundry;Shop;Cleaning    Stability/Clinical Decision Making  Evolving/Moderate complexity    Rehab Potential  Good    PT Frequency  2x / week    PT Duration  12 weeks    PT Treatment/Interventions  ADLs/Self Care Home Management;Cryotherapy;Moist Heat;Gait training;Stair training;Functional mobility training;Therapeutic activities;Therapeutic exercise;Balance training;Neuromuscular re-education;Manual techniques;Joint Manipulations;Passive range of motion;Dry needling;DME Instruction    PT Next Visit Plan  Strengthening, ROM and muscle endurance, gait training when appropriate    PT Home Exercise Plan  [employer]: [email or web address]    Consulted and Agree with Plan of Care  Patient       Patient will benefit from skilled therapeutic intervention in order to improve the following deficits and impairments:  Abnormal gait, Decreased endurance, Decreased activity tolerance, Decreased range of motion, Decreased strength, Pain, Impaired flexibility, Difficulty walking, Decreased balance, Impaired perceived functional ability, Decreased knowledge of use of DME, Decreased mobility  Visit Diagnosis: Acute pain of left knee  Unsteadiness on feet  Muscle weakness (generalized)     Problem List Patient Active Problem List   Diagnosis Date Noted  . Hypertension   . Hyperlipidemia   . Asthma   . Tibial plateau fracture, left, closed, with nonunion, subsequent encounter 08/03/2019    Everlean Alstrom. Graylon Good, PT, DPT 09/26/19, 8:40 PM  [facility] [address], [place], [address] Phone: [phone]   Fax:  [phone]  Name: GILDO CRISCO MRN: [id number] Date of Birth: [birth year]

## 2019-09-27 DIAGNOSIS — S82142K Displaced bicondylar fracture of left tibia, subsequent encounter for closed fracture with nonunion: Secondary | ICD-10-CM | POA: Diagnosis not present

## 2019-09-27 DIAGNOSIS — S82102K Unspecified fracture of upper end of left tibia, subsequent encounter for closed fracture with nonunion: Secondary | ICD-10-CM | POA: Diagnosis not present

## 2019-09-28 ENCOUNTER — Other Ambulatory Visit: Payer: Self-pay

## 2019-09-28 ENCOUNTER — Encounter: Payer: Self-pay | Admitting: Physical Therapy

## 2019-09-28 ENCOUNTER — Ambulatory Visit: Payer: PPO | Attending: Orthopedic Surgery | Admitting: Physical Therapy

## 2019-09-28 DIAGNOSIS — R2681 Unsteadiness on feet: Secondary | ICD-10-CM

## 2019-09-28 DIAGNOSIS — M6281 Muscle weakness (generalized): Secondary | ICD-10-CM | POA: Diagnosis not present

## 2019-09-28 DIAGNOSIS — M25562 Pain in left knee: Secondary | ICD-10-CM | POA: Insufficient documentation

## 2019-09-28 NOTE — Therapy (Signed)
[facility] [address], [place], [address] Phone: [phone]   Fax:  [phone]  Physical Therapy Treatment / Progress Note Reporting Period: 08/29/2019 - 09/28/2019  Patient Details  Name: NICHOLAOS SCHIPPERS MRN: [id number] Date of Birth: May 25, 1946 Referring Provider (PT): Altamese [place], MD   Encounter Date: 09/28/2019  PT End of Session - 09/28/19 1123    Visit Number  40    Number of Visits  43    Date for PT Re-Evaluation  11/06/19    Authorization Type  [employer] reporting period from 08/29/2019    Authorization Time Period  N/A    Authorization - Visit Number  10    Authorization - Number of Visits  10    Progress Note Due on Visit  50   NEXT FOTO DUE = 4/13   PT Start Time  1105    PT Stop Time  1200    PT Time Calculation (min)  55 min    Activity Tolerance  Patient tolerated treatment well    Behavior During Therapy  [facility] for tasks assessed/performed       Past Medical History:  Diagnosis Date  . Anemia   . Asthma    as a child, exercise asthma - none since high school"  . Cancer ([facility])    skin cancer - basal, squamous- Mylenoma - stage 2- chest  . Complication of anesthesia    "very sensitive to medications" "Had colonscopy with just Draminine"  . Family history of adverse reaction to anesthesia    daughter- N/V  . Hyperlipidemia   . Hypertension   . PONV (postoperative nausea and vomiting)     Past Surgical History:  Procedure Laterality Date  . APPENDECTOMY  2011   Dr. Bary Castilla  . COLONOSCOPY WITH PROPOFOL N/A 02/10/2017   Procedure: COLONOSCOPY WITH PROPOFOL;  Surgeon: Christene Lye, MD;  Location: [facility] ENDOSCOPY;  Service: Endoscopy;  Laterality: N/A;  . ORIF TIBIA PLATEAU Left 08/03/2019   Procedure: OPEN REDUCTION INTERNAL FIXATION (ORIF) TIBIAL PLATEAU;  Surgeon: Altamese Valencia, MD;  Location: [facility];  Service: Orthopedics;  Laterality: Left;    There were no  vitals filed for this visit.  Subjective Assessment - 09/28/19 1106    Subjective  Per pt email "Basically I received a good progress report. I can start gradual weight bearing 25% for 5 or 6 days then progress to 50% for 5 or 6 days, etc. Hinge brace must be on. He loved my leg straightening (he got 4% before stretching) and knee bending (112 no warmup). The only issue and he really didn't seem all that concerned was a slight shift in alignment, about 2.6 degrees. I am to start weight bearing as instructed, walker first then progress to crutches (possibly cane) but he liked crutches. I'll be seen again in 4 weeks with more x rays then. There was talk of a bone stimulator to enhance healing (he did see continued healing on x ray today) and we're to  talk with a source about the stimulator."  Patient arrives today with no pain. Reports he is feeling a shift in his knee some steps while partial weight bearing and has called his MD about it. No pain.    Patient is accompained by:  Family member    Pertinent History  L ankle surgery; hypertension; history of falling; L knee pain.    Limitations  Lifting;Standing;Walking;House hold activities    How long can  you stand comfortably?  can do dishes    How long can you walk comfortably?  can only go a small ways inside the home or from car to clinic before he needs to rest due to fatigue    Diagnostic tests  CT scan L knee report 07/18/2019 "IMPRESSION: 1. 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 2. Partial healing of the impacted fracture of the proximal fibula. 3. Moderate knee joint effusion."    Patient Stated Goals  Able to walk without AD    Currently in Pain?  No/denies    Pain Onset  1 to 4 weeks ago       OBJECTIVE:  FUNCTIONAL OUTCOME MEASURE FOTO = 51 (09/26/2019)  MANUAL ASSESSMENT - mild laxity noted in L proximal tibia in sagittal and frontal planes.  PROM Knee  Flexion: L [id number](DDUKG to  stretching)122(following manual stretching) - last measured 09/26/2019  Extension:-10 (prior to stretch) 0*(after stretch) *indicates pain  AROM Knee  Flexion: 107(prior to manualtechniques for flexion)- last measured 09/26/19  Extension:Lacking 20degreesprior toestim, Lacking20after estim, stretch.  *indicates pain   MUSCLE PERFORMANCE (MMT) - last measured 09/26/2019 R/L 4/4Hip flexion 4/4 Hip abduction  4+/4+Hip extension 5/3+Knee extension 5/4-Knee flexion; 5/4Ankle Dorsiflexion 5/4+Ankle Plantarflexion *indicates pain  Active SLR 10degree lag with estim.    TREATMENT: 11/27/2019 MD visit: start gradual weight bearing 25% for 5 or 6 days then progress to 50% for 5 or 6 days, etc. Hinge brace must be on.    Therapeutic exercise:to centralize symptoms and improve ROM, strength, muscular endurance, and activity tolerance required for successful completion of functional activities.  - ambulation x 200 feet in the clinic and outside using 25% weight bearing (~60#) through L LE and upright posture. No shifting sensation noted.   - supine L knee extension stretch with intermittent quad set heel elevated, toes strapped to maintain neutral, 10# ankle weight over knee. x30mn. Improved from lacking5degrees to nearly0degrees.  -very short arc quad overfoam roll, then quad set to A SLR with [place] estim. On/Off4/12secondsto startup to75-838mintensity (to pt tolerance with palpable contraction).With active quad contraction. burst frequency70bps, duty cycle 50%, ramp 5 second. 2"x4" oval pads placed at proximal and distal L quad.4m13mof active runningtime in each positionwith extra time forset up for estim and and removal of pads.Lacking15degrees in active extension with estimbutcontinuesmore robust contraction this session.  - long arc quad L knee, x50 with 10# ankle weight (starting to feel fatigued).  Trialed  again with various combinations of band only (red/yellow) and 10# ankle weight with band (red/yellow).   4x20-30 reps each. Settled on 10# x50 reps or 10# with yellow theraband for less reps at home, with instructions to find a resistance that causes quad fatigue but not excessive proximal anteiror tibial discomfort and complete that 3x each day at home.             Manual therapy:to reduce pain and tissue tension, improve range of motion, neuromodulation, in order to promote improved ability to complete functional activities. - supine L knee extension PROM with OP 5x5 seconds. - assessment of L proximal tibial stability, and knee joint accessory motion - WFL with slightly lax anterior drawer.    HOME EXERCISE PROGRAM Access Code: [email or web address] URL: [email or web address] Date: 09/19/2019 Prepared by: SarRosita Keaxercises Seated Passive Knee Extension with Weight - 2 x daily - 5 min hold Supine Short Arc Quad - 1 x daily - 2 sets - 10 reps - 5  seconds hold Supine Active Straight Leg Raise - 1 x daily - 2 sets - 10 reps - 1 second hold Seated Long Arc Quad with Ankle Weight - 1 x daily - 3 sets - 10 reps - 1 second hold Hamstring stretch (with strap) - 1 x daily - 3 reps - 30 seconds hold Long Sitting Calf Stretch with Strap - 1 x daily - 3 reps - 30 seconds hold   PT Education - 09/28/19 1122    Education Details  exercise purpose/form. self management techniques.    Person(s) Educated  Patient    Methods  Explanation;Demonstration;Tactile cues;Verbal cues    Comprehension  Verbalized understanding;Returned demonstration;Verbal cues required;Tactile cues required       PT Short Term Goals - 08/14/19 1328      PT SHORT TERM GOAL #1   Title  Be independent with initial home exercise program for self-management of symptoms.    Baseline  initial HEP provided at IE (04/18/2019);    Time  2    Period  Weeks    Status  Achieved    Target Date  05/02/19        PT  Long Term Goals - 09/28/19 2016      PT LONG TERM GOAL #1   Title  Be independent with a long-term home exercise program for self-management of symptoms.    Baseline  initial HEP provided at IE (04/18/2019); continues to participate in currently appropriate HEP (05/18/2019; 06/29/2019); updated HEP as appropriate for post-op rehab (08/14/2019); currently participating in appropriate HEP, has not yet received final long term HEP (08/24/2019; 09/26/2019);    Time  12    Period  Weeks    Status  Partially Met    Target Date  11/06/19      PT LONG TERM GOAL #2   Title  Demonstrate improved FOTO score by 10 units to demonstrate improvement in overall condition and self-reported functional ability.    Baseline  44 (04/18/2019); 52 (05/18/2019); 52 (06/29/2019); FOTO = 40 (08/14/2019); 51 (09/26/2019);    Time  12    Period  Weeks    Status  Partially Met    Target Date  11/06/19      PT LONG TERM GOAL #3   Title  Complete community, work and/or recreational activities without limitation due to current condition.    Baseline  Difficulites with walking, standing, shopping, driving, cooking and bathing, currently NWB L LE (04/18/2019); continues with similar difficulties but is now PWB25% and is able to drive, cross legs to don shoes (05/18/2019); improving but continues to have limitations and cannot perform activities that require single leg stance full weight bearing (06/29/2019); currently limited similarly to intial eval due to recent surgery 08/03/2019 to repair non-union fracture (08/14/2019); continues to have similar limitations (08/24/2019); is improving again but is not yet weight bearing that produces similar limitations, able to shower in 30 min independently now (09/26/2019);    Time  12    Period  Weeks    Status  Partially Met    Target Date  11/06/19      PT LONG TERM GOAL #4   Title  Increase L knee flexion AROM to 140 degrees to be able to complete ADLs without limitation of knee flexion     Baseline  PROM 95 degrees of flexion with pain at end range and AROM 80 degrees(04/18/2019); AAROM 110 with end range pain (05/18/2019); PROM 0-126, AROM ext -25, AAROM flex 115 (06/29/2019); PROM  lacking 10 degrees extension, up to 100 degrees flexion (with brace on) (08/14/2019); PROM extension lacking ~ 2 degrees, PROM flexion: 110 degrees (08/24/2019); PROM extension to 0 degrees, PROM flexion 122 degrees (09/26/2019);    Time  12    Period  Weeks    Status  Partially Met    Target Date  11/06/19      PT LONG TERM GOAL #5   Title  Pt will increase LE strength to equal or greater than 4+/5 MMT in order to demonstrate improvement in strength and function.    Baseline  See objectives on IE(04/18/2019); still 2/10 L knee extension (lacking last 22 degrees against gravity) (05/18/2019); continues to lack terminal knee extension in open change position, improved to at least 4/5 with discomfort within available range (06/29/2019); has decreased in since last session due to recent surgery and away from PT 2 weeks (08/14/2019); 25 degree extensor lag (08/24/2019); has 15 degree extensor lag with etim, MMT improving - see objective (09/26/2019);    Time  12    Period  Weeks    Status  Partially Met    Target Date  11/06/19            Plan - 09/28/19 2016    Clinical Impression Statement  Patient tolerated treatment well with no lasting increase in pain or discomfort. Was unable to feel the shifting that pt described during session. Attempted to find optimal loading for L quad strengthening.It takes pt many reps to fatigue quad at lower weights but if the weight is too high he gets undesirable discomfort at the tibial tuberosity. Patient has attended 40 physical therapy treatment sessions this episode of care and is showing progress again towards all of his goals following ORIF surgery after non-union was discovered at his proximal tibia. He is showing improvements in L quad activation and strength,  although he still has an extensor lag and is unable to reach end range extension actively. He has improved L knee PROM to 0-122 and is able to complete an active SLR with 10 degree lag. Patient continues to demonstrate slight laxity in proximal L tibia to manual pressure. Patient recent was allowed to start progressive weight bearing per MD. Patient continues to demonstrate significant impairments such as deficits in muscle performance (strength/power/endurance), ROM, joint stiffness, tissue integrity, mobility, pain, weight bearing restrictions. These deficits limit the patient ability to perform things such as ADLs, IADLs, social participation, caring for others, engaging in hobbies (house working, cooking, and driving), and impairs his quality of life. Patient would benefit from continued management of limiting condition by skilled physical therapist to address remaining impairments and functional limitations to work towards stated goals and return to PLOF or maximal functional independence    Personal Factors and Comorbidities  Comorbidity 2;Age    Comorbidities  L ankle surgery; hypertension; history of falling; L knee pain.    Examination-Activity Limitations  Bathing;Bed Mobility;Locomotion Level;Stairs;Stand;Hygiene/Grooming;Bend;Caring for Others;Sleep;Lift;Transfers;Toileting;Squat;Carry    Examination-Participation Restrictions  Yard Work;Driving;Meal Prep;Laundry;Shop;Cleaning    Stability/Clinical Decision Making  Evolving/Moderate complexity    Rehab Potential  Good    PT Frequency  2x / week    PT Duration  12 weeks    PT Treatment/Interventions  ADLs/Self Care Home Management;Cryotherapy;Moist Heat;Gait training;Stair training;Functional mobility training;Therapeutic activities;Therapeutic exercise;Balance training;Neuromuscular re-education;Manual techniques;Joint Manipulations;Passive range of motion;Dry needling;DME Instruction    PT Next Visit Plan  Strengthening, ROM and muscle  endurance, gait training when appropriate    PT [place]:  [email or web address]    Consulted and Agree with Plan of Care  Patient       Patient will benefit from skilled therapeutic intervention in order to improve the following deficits and impairments:  Abnormal gait, Decreased endurance, Decreased activity tolerance, Decreased range of motion, Decreased strength, Pain, Impaired flexibility, Difficulty walking, Decreased balance, Impaired perceived functional ability, Decreased knowledge of use of DME, Decreased mobility  Visit Diagnosis: Acute pain of left knee  Unsteadiness on feet  Muscle weakness (generalized)     Problem List Patient Active Problem List   Diagnosis Date Noted  . Hypertension   . Hyperlipidemia   . Asthma   . Tibial plateau fracture, left, closed, with nonunion, subsequent encounter 08/03/2019    Everlean Alstrom. Graylon Good, PT, DPT 09/28/19, 8:18 PM  [facility] [address] [place], [address] Phone: [phone]   Fax:  [phone]  Name: VALE MOUSSEAU MRN: [id number] Date of Birth: Dec 05, 1945

## 2019-10-03 ENCOUNTER — Other Ambulatory Visit: Payer: Self-pay

## 2019-10-03 ENCOUNTER — Ambulatory Visit: Payer: PPO

## 2019-10-03 DIAGNOSIS — M25562 Pain in left knee: Secondary | ICD-10-CM

## 2019-10-03 DIAGNOSIS — R2681 Unsteadiness on feet: Secondary | ICD-10-CM

## 2019-10-03 DIAGNOSIS — M6281 Muscle weakness (generalized): Secondary | ICD-10-CM

## 2019-10-03 NOTE — Therapy (Signed)
[facility] [address], [place], [address] Phone: [phone]   Fax:  [phone]  Physical Therapy Treatment  Patient Details  Name: Glenn Watson MRN: [id number] Date of Birth: 1946/03/24 Referring Provider (PT): Altamese Olla, MD   Encounter Date: 10/03/2019  PT End of Session - 10/03/19 1130    Visit Number  41    Number of Visits  26    Date for PT Re-Evaluation  11/06/19    Authorization Type  [employer] reporting period from 08/29/2019    Authorization Time Period  N/A    Authorization - Visit Number  1    Authorization - Number of Visits  10    Progress Note Due on Visit  50   FOTO due 4/13   PT Start Time  1120    PT Stop Time  1200    PT Time Calculation (min)  40 min    Equipment Utilized During Treatment  Gait belt    Activity Tolerance  Patient tolerated treatment well    Behavior During Therapy  WFL for tasks assessed/performed       Past Medical History:  Diagnosis Date  . Anemia   . Asthma    as a child, exercise asthma - none since high school"  . Cancer ([place])    skin cancer - basal, squamous- Mylenoma - stage 2- chest  . Complication of anesthesia    "very sensitive to medications" "Had colonscopy with just Draminine"  . Family history of adverse reaction to anesthesia    daughter- N/V  . Hyperlipidemia   . Hypertension   . PONV (postoperative nausea and vomiting)     Past Surgical History:  Procedure Laterality Date  . APPENDECTOMY  2011   Dr. Bary Castilla  . COLONOSCOPY WITH PROPOFOL N/A 02/10/2017   Procedure: COLONOSCOPY WITH PROPOFOL;  Surgeon: Christene Lye, MD;  Location: [facility];  Service: Endoscopy;  Laterality: N/A;  . ORIF TIBIA PLATEAU Left 08/03/2019   Procedure: OPEN REDUCTION INTERNAL FIXATION (ORIF) TIBIAL PLATEAU;  Surgeon: Altamese , MD;  Location: [facility];  Service: Orthopedics;  Laterality: Left;    There were no vitals filed  for this visit.  Subjective Assessment - 10/03/19 1125    Subjective  Pt conitnued to adhere to recently updated weight bearing plan via surgeon. Pt reports he was able to LAQ 200x at home yesterday without the brace without issue.    Patient is accompained by:  Family member    Pertinent History  L ankle surgery; hypertension; history of falling; L knee pain.    Currently in Pain?  No/denies         TREATMENT:  11/27/2019 MD visit: start gradual weight bearing 25% for 5 or 6 days then progress to 50% for 5 or 6 days, etc. Hinge brace must be on.     Therapeutic exercise:   -reviewed recommendations for weight bearing in gait (pt has good awareness of how to perform) - supine L knee extension stretch with intermittent quad set heel elevated, toes strapped to maintain neutral, 10# ankle weight over knee -sensory level tens maintained to comfort level 2 leads crossing the left knee, for majority of supine activity -Lt quad set into towel roll 15x3secH  -Left SAQ (on 6" foam roll) 1x10 c 5lb AW  -Rt sidelying Left Hip ABDCT AA/ROM c isometric holdx3sec 2x10 (significant fatigue limitation) -Prone on elbows Left knee flexion hamstrings curl 2x15, noted ROM  deficits of flexion and rotation of hip (remaining partially externally rotated  -standing, hip hinged over elevated plinth, straight leg hip extension left 1x15        PT Short Term Goals - 08/14/19 1328      PT SHORT TERM GOAL #1   Title  Be independent with initial home exercise program for self-management of symptoms.    Baseline  initial HEP provided at IE (04/18/2019);    Time  2    Period  Weeks    Status  Achieved    Target Date  05/02/19        PT Long Term Goals - 09/28/19 2016      PT LONG TERM GOAL #1   Title  Be independent with a long-term home exercise program for self-management of symptoms.    Baseline  initial HEP provided at IE (04/18/2019); continues to participate in currently appropriate HEP  (05/18/2019; 06/29/2019); updated HEP as appropriate for post-op rehab (08/14/2019); currently participating in appropriate HEP, has not yet received final long term HEP (08/24/2019; 09/26/2019);    Time  12    Period  Weeks    Status  Partially Met    Target Date  11/06/19      PT LONG TERM GOAL #2   Title  Demonstrate improved FOTO score by 10 units to demonstrate improvement in overall condition and self-reported functional ability.    Baseline  44 (04/18/2019); 52 (05/18/2019); 52 (06/29/2019); FOTO = 40 (08/14/2019); 51 (09/26/2019);    Time  12    Period  Weeks    Status  Partially Met    Target Date  11/06/19      PT LONG TERM GOAL #3   Title  Complete community, work and/or recreational activities without limitation due to current condition.    Baseline  Difficulites with walking, standing, shopping, driving, cooking and bathing, currently NWB L LE (04/18/2019); continues with similar difficulties but is now PWB25% and is able to drive, cross legs to don shoes (05/18/2019); improving but continues to have limitations and cannot perform activities that require single leg stance full weight bearing (06/29/2019); currently limited similarly to intial eval due to recent surgery 08/03/2019 to repair non-union fracture (08/14/2019); continues to have similar limitations (08/24/2019); is improving again but is not yet weight bearing that produces similar limitations, able to shower in 30 min independently now (09/26/2019);    Time  12    Period  Weeks    Status  Partially Met    Target Date  11/06/19      PT LONG TERM GOAL #4   Title  Increase L knee flexion AROM to 140 degrees to be able to complete ADLs without limitation of knee flexion    Baseline  PROM 95 degrees of flexion with pain at end range and AROM 80 degrees(04/18/2019); AAROM 110 with end range pain (05/18/2019); PROM 0-126, AROM ext -25, AAROM flex 115 (06/29/2019); PROM lacking 10 degrees extension, up to 100 degrees flexion (with  brace on) (08/14/2019); PROM extension lacking ~ 2 degrees, PROM flexion: 110 degrees (08/24/2019); PROM extension to 0 degrees, PROM flexion 122 degrees (09/26/2019);    Time  12    Period  Weeks    Status  Partially Met    Target Date  11/06/19      PT LONG TERM GOAL #5   Title  Pt will increase LE strength to equal or greater than 4+/5 MMT in order to demonstrate improvement in strength and  function.    Baseline  See objectives on IE(04/18/2019); still 2/10 L knee extension (lacking last 22 degrees against gravity) (05/18/2019); continues to lack terminal knee extension in open change position, improved to at least 4/5 with discomfort within available range (06/29/2019); has decreased in since last session due to recent surgery and away from PT 2 weeks (08/14/2019); 25 degree extensor lag (08/24/2019); has 15 degree extensor lag with etim, MMT improving - see objective (09/26/2019);    Time  12    Period  Weeks    Status  Partially Met    Target Date  11/06/19            Plan - 10/03/19 1131    Clinical Impression Statement  Continued with current plan of care, gently progressing patient's program aimed at address deficits and limitations identified in evlauation. Pt continues to make steady progress toward treatment goals in general. Author provides extensive verbal, visual, and tactile cues when needed to assure all interventions are performed with desired form and good accuracy. Extensive communicaiton to assure pt is able to perform all activities without exacerbation of pain or other symptoms. Pt continued to have restricted ROM in hip extension and rotation, knee flexion, and slightly remaining in knee extension which I suspect is more related to joint effusion than a true arthrofibrotic state.    Examination-Activity Limitations  Bathing;Bed Mobility;Locomotion Level;Stairs;Stand;Hygiene/Grooming;Bend;Caring for Others;Sleep;Lift;Transfers;Toileting;Squat;Carry     Examination-Participation Restrictions  Yard Work;Driving;Meal Prep;Laundry;Shop;Cleaning    Stability/Clinical Decision Making  Evolving/Moderate complexity    Clinical Decision Making  Moderate    Rehab Potential  Good    PT Frequency  2x / week    PT Duration  2 weeks    PT Treatment/Interventions  ADLs/Self Care Home Management;Cryotherapy;Moist Heat;Gait training;Stair training;Functional mobility training;Therapeutic activities;Therapeutic exercise;Balance training;Neuromuscular re-education;Manual techniques;Joint Manipulations;Passive range of motion;Dry needling;DME Instruction    PT Next Visit Plan  Strengthening, ROM and muscle endurance, gait training when appropriate    Consulted and Agree with Plan of Care  Patient    Family Member Consulted  Wife       Patient will benefit from skilled therapeutic intervention in order to improve the following deficits and impairments:  Abnormal gait, Decreased endurance, Decreased activity tolerance, Decreased range of motion, Decreased strength, Pain, Impaired flexibility, Difficulty walking, Decreased balance, Impaired perceived functional ability, Decreased knowledge of use of DME, Decreased mobility  Visit Diagnosis: Acute pain of left knee  Unsteadiness on feet  Muscle weakness (generalized)     Problem List Patient Active Problem List   Diagnosis Date Noted  . Hypertension   . Hyperlipidemia   . Asthma   . Tibial plateau fracture, left, closed, with nonunion, subsequent encounter 08/03/2019   12:08 PM, 10/03/19 Etta Grandchild, PT, DPT Physical Therapist - [place] [phone] (Office)   Colie Josten C 10/03/2019, 11:33 AM  [facility] [address], [place], [address] Phone: [phone]   Fax:  [phone]  Name: Glenn Watson MRN: [id number] Date of Birth: August 22, 1945

## 2019-10-05 ENCOUNTER — Ambulatory Visit: Payer: PPO

## 2019-10-05 ENCOUNTER — Other Ambulatory Visit: Payer: Self-pay

## 2019-10-05 DIAGNOSIS — M6281 Muscle weakness (generalized): Secondary | ICD-10-CM

## 2019-10-05 DIAGNOSIS — M25562 Pain in left knee: Secondary | ICD-10-CM

## 2019-10-05 DIAGNOSIS — R2681 Unsteadiness on feet: Secondary | ICD-10-CM

## 2019-10-05 NOTE — Therapy (Signed)
[facility] [address] [place], [address] Phone: [phone]   Fax:  [phone]  Physical Therapy Treatment  Patient Details  Name: Glenn Watson MRN: [id number] Date of Birth: [birth year] Referring Provider (PT): Altamese [place], MD   Encounter Date: 10/05/2019  PT End of Session - 10/05/19 1133    Visit Number  42    Number of Visits  33    Date for PT Re-Evaluation  11/06/19    Authorization Type  [employer] reporting period from 08/29/2019    Authorization Time Period  N/A    Authorization - Visit Number  2    Authorization - Number of Visits  10    Progress Note Due on Visit  50   4/13   PT Start Time  1115    PT Stop Time  1155    PT Time Calculation (min)  40 min    Equipment Utilized During Treatment  Gait belt    Activity Tolerance  Patient tolerated treatment well;No increased pain    Behavior During Therapy  WFL for tasks assessed/performed       Past Medical History:  Diagnosis Date  . Anemia   . Asthma    as a child, exercise asthma - none since high school"  . Cancer ([place])    skin cancer - basal, squamous- Mylenoma - stage 2- chest  . Complication of anesthesia    "very sensitive to medications" "Had colonscopy with just Draminine"  . Family history of adverse reaction to anesthesia    daughter- N/V  . Hyperlipidemia   . Hypertension   . PONV (postoperative nausea and vomiting)     Past Surgical History:  Procedure Laterality Date  . APPENDECTOMY  2011   Dr. Bary Castilla  . COLONOSCOPY WITH PROPOFOL N/A 02/10/2017   Procedure: COLONOSCOPY WITH PROPOFOL;  Surgeon: Christene Lye, MD;  Location: [facility];  Service: Endoscopy;  Laterality: N/A;  . ORIF TIBIA PLATEAU Left 08/03/2019   Procedure: OPEN REDUCTION INTERNAL FIXATION (ORIF) TIBIAL PLATEAU;  Surgeon: Altamese [place], MD;  Location: [facility];  Service: Orthopedics;  Laterality: Left;    There were no  vitals filed for this visit.  Subjective Assessment - 10/05/19 1118    Subjective  Pt reports doing well this date. He reports he went to the gym and rode the recumbent bike for 25 minutes, low resistance and felt good.    Pertinent History  L ankle surgery; hypertension; history of falling; L knee pain.    Limitations  Lifting;Standing;Walking;House hold activities    Currently in Pain?  No/denies       TREATMENT:  11/27/2019 MD visit: start gradual weight bearing 25% for 5 or 6 days then progress to 50% for 5 or 6 days, etc. Hinge brace must be on.  ROM this visit: 9-110 degrees     Therapeutic exercise:  *sensory level TENS donned throughout, 1 lead on VMO, 1 lead on VL/RecFem, to patient comfort  -Semirecumbent Left knee extension low-load-long-duration stretch 2x4 minutes, 10lbs, heel elevated, intermittent SAQ over foam roller 2x15 -Hooklying bridge 2 x15 -LLE hooklying to marching 2x15 (permissive knee flexion ROM)  -Standing LLE straight leg hip extension 1x15 -Standing LLE straight leg hip extension/abduction (to 7:30 or 45 degrees) 1x15 (medius remains quite weak/fatiguable) -Standing LLE knee flexion1x15   PT Short Term Goals - 08/14/19 1328      PT SHORT TERM GOAL #1   Title  Be  independent with initial home exercise program for self-management of symptoms.    Baseline  initial HEP provided at IE (04/18/2019);    Time  2    Period  Weeks    Status  Achieved    Target Date  05/02/19        PT Long Term Goals - 09/28/19 2016      PT LONG TERM GOAL #1   Title  Be independent with a long-term home exercise program for self-management of symptoms.    Baseline  initial HEP provided at IE (04/18/2019); continues to participate in currently appropriate HEP (05/18/2019; 06/29/2019); updated HEP as appropriate for post-op rehab (08/14/2019); currently participating in appropriate HEP, has not yet received final long term HEP (08/24/2019; 09/26/2019);    Time  12    Period   Weeks    Status  Partially Met    Target Date  11/06/19      PT LONG TERM GOAL #2   Title  Demonstrate improved FOTO score by 10 units to demonstrate improvement in overall condition and self-reported functional ability.    Baseline  44 (04/18/2019); 52 (05/18/2019); 52 (06/29/2019); FOTO = 40 (08/14/2019); 51 (09/26/2019);    Time  12    Period  Weeks    Status  Partially Met    Target Date  11/06/19      PT LONG TERM GOAL #3   Title  Complete community, work and/or recreational activities without limitation due to current condition.    Baseline  Difficulites with walking, standing, shopping, driving, cooking and bathing, currently NWB L LE (04/18/2019); continues with similar difficulties but is now PWB25% and is able to drive, cross legs to don shoes (05/18/2019); improving but continues to have limitations and cannot perform activities that require single leg stance full weight bearing (06/29/2019); currently limited similarly to intial eval due to recent surgery 08/03/2019 to repair non-union fracture (08/14/2019); continues to have similar limitations (08/24/2019); is improving again but is not yet weight bearing that produces similar limitations, able to shower in 30 min independently now (09/26/2019);    Time  12    Period  Weeks    Status  Partially Met    Target Date  11/06/19      PT LONG TERM GOAL #4   Title  Increase L knee flexion AROM to 140 degrees to be able to complete ADLs without limitation of knee flexion    Baseline  PROM 95 degrees of flexion with pain at end range and AROM 80 degrees(04/18/2019); AAROM 110 with end range pain (05/18/2019); PROM 0-126, AROM ext -25, AAROM flex 115 (06/29/2019); PROM lacking 10 degrees extension, up to 100 degrees flexion (with brace on) (08/14/2019); PROM extension lacking ~ 2 degrees, PROM flexion: 110 degrees (08/24/2019); PROM extension to 0 degrees, PROM flexion 122 degrees (09/26/2019);    Time  12    Period  Weeks    Status  Partially Met     Target Date  11/06/19      PT LONG TERM GOAL #5   Title  Pt will increase LE strength to equal or greater than 4+/5 MMT in order to demonstrate improvement in strength and function.    Baseline  See objectives on IE(04/18/2019); still 2/10 L knee extension (lacking last 22 degrees against gravity) (05/18/2019); continues to lack terminal knee extension in open change position, improved to at least 4/5 with discomfort within available range (06/29/2019); has decreased in since last session due to recent surgery  and away from PT 2 weeks (08/14/2019); 25 degree extensor lag (08/24/2019); has 15 degree extensor lag with etim, MMT improving - see objective (09/26/2019);    Time  12    Period  Weeks    Status  Partially Met    Target Date  11/06/19            Plan - 10/05/19 1135    Clinical Impression Statement  Pt conitnues to do well in general. Began alternating sustained low-load-long duration stretches with intermittent A/ROM. Sensory TENS maintained throughout treatment for empircal management of edema mediated neuro-inhibition. Pt has no pain in session. Motor activation of knee extension is quite good, but arthrogenic stiffness continues to dominate. Knee flexion is more limited only achieving 110 degrees this date. Pt does well with new gluteal exercises, should be considered for next round of HEP updates.    Personal Factors and Comorbidities  Comorbidity 2;Age    Comorbidities  L ankle surgery; hypertension; history of falling; L knee pain.    Examination-Activity Limitations  Bathing;Bed Mobility;Locomotion Level;Stairs;Stand;Hygiene/Grooming;Bend;Caring for Others;Sleep;Lift;Transfers;Toileting;Squat;Carry    Examination-Participation Restrictions  Yard Work;Driving;Meal Prep;Laundry;Shop;Cleaning    Stability/Clinical Decision Making  Evolving/Moderate complexity    Rehab Potential  Good    PT Frequency  2x / week    PT Duration  2 weeks    PT Treatment/Interventions  ADLs/Self  Care Home Management;Cryotherapy;Moist Heat;Gait training;Stair training;Functional mobility training;Therapeutic activities;Therapeutic exercise;Balance training;Neuromuscular re-education;Manual techniques;Joint Manipulations;Passive range of motion;Dry needling;DME Instruction    PT Next Visit Plan  Strengthening, ROM and muscle endurance, gait training when appropriate    PT Home Exercise Plan  Medbridge: [email or web address]    Consulted and Agree with Plan of Care  Patient    Family Member Consulted  Wife       Patient will benefit from skilled therapeutic intervention in order to improve the following deficits and impairments:  Abnormal gait, Decreased endurance, Decreased activity tolerance, Decreased range of motion, Decreased strength, Pain, Impaired flexibility, Difficulty walking, Decreased balance, Impaired perceived functional ability, Decreased knowledge of use of DME, Decreased mobility  Visit Diagnosis: Acute pain of left knee  Unsteadiness on feet  Muscle weakness (generalized)     Problem List Patient Active Problem List   Diagnosis Date Noted  . Hypertension   . Hyperlipidemia   . Asthma   . Tibial plateau fracture, left, closed, with nonunion, subsequent encounter 08/03/2019   12:03 PM, 10/05/19 Etta Grandchild, PT, DPT Physical Therapist - [place] [phone] (Office)    Jamar Casagrande C 10/05/2019, 11:40 AM  [facility] [address] [place], [address] Phone: [phone]   Fax:  [phone]  Name: Glenn Watson MRN: [id number] Date of Birth: [birth year]

## 2019-10-10 ENCOUNTER — Ambulatory Visit: Payer: PPO | Admitting: Physical Therapy

## 2019-10-10 ENCOUNTER — Encounter: Payer: Self-pay | Admitting: Physical Therapy

## 2019-10-10 ENCOUNTER — Other Ambulatory Visit: Payer: Self-pay

## 2019-10-10 DIAGNOSIS — M25562 Pain in left knee: Secondary | ICD-10-CM | POA: Diagnosis not present

## 2019-10-10 DIAGNOSIS — R2681 Unsteadiness on feet: Secondary | ICD-10-CM

## 2019-10-10 DIAGNOSIS — M6281 Muscle weakness (generalized): Secondary | ICD-10-CM

## 2019-10-10 NOTE — Therapy (Signed)
[facility] [address], [place], [address] Phone: [phone]   Fax:  [phone]  Physical Therapy Treatment  Patient Details  Name: Glenn Watson MRN: [id number] Date of Birth: [birth year] Referring Provider (PT): [name], MD   Encounter Date: 10/10/2019  PT End of Session - 10/10/19 1217    Visit Number  43    Number of Visits  40    Date for PT Re-Evaluation  11/06/19    Authorization Type  [employer] reporting period from 08/29/2019    Authorization Time Period  N/A    Authorization - Visit Number  3    Authorization - Number of Visits  10    Progress Note Due on Visit  7   Next FOTO due 4/27   PT Start Time  1120    PT Stop Time  1200    PT Time Calculation (min)  40 min    Equipment Utilized During Treatment  Gait belt    Activity Tolerance  Patient tolerated treatment well;No increased pain    Behavior During Therapy  WFL for tasks assessed/performed       Past Medical History:  Diagnosis Date  . Anemia   . Asthma    as a child, exercise asthma - none since high school"  . Cancer ([place])    skin cancer - basal, squamous- Mylenoma - stage 2- chest  . Complication of anesthesia    "very sensitive to medications" "Had colonscopy with just Draminine"  . Family history of adverse reaction to anesthesia    daughter- N/V  . Hyperlipidemia   . Hypertension   . PONV (postoperative nausea and vomiting)     Past Surgical History:  Procedure Laterality Date  . APPENDECTOMY  2011   Dr. Bary Castilla  . COLONOSCOPY WITH PROPOFOL N/A 02/10/2017   Procedure: COLONOSCOPY WITH PROPOFOL;  Surgeon: Christene Lye, MD;  Location: [facility];  Service: Endoscopy;  Laterality: N/A;  . ORIF TIBIA PLATEAU Left 08/03/2019   Procedure: OPEN REDUCTION INTERNAL FIXATION (ORIF) TIBIAL PLATEAU;  Surgeon: Altamese [place], MD;  Location: [facility];  Service: Orthopedics;  Laterality: Left;     There were no vitals filed for this visit.  Subjective Assessment - 10/10/19 1215    Subjective  Patient reports he is feeling well and has no pain upon arrival. Has been doing standing hip exercises and stretching at home. No excessive pain or soreness following last PT session. Feels that he is putting significant weight on his L LE now, near full WB. Cooked his wife dinner for the first time since the injury yesterday and did not prop up his L LE, so his ankle had a bit of pain and was very swollen but this improved overnight to his normal baseline.    Pertinent History  L ankle surgery; hypertension; history of falling; L knee pain.    Limitations  Lifting;Standing;Walking;House hold activities    Currently in Pain?  No/denies       OBJECTIVE:  FUNCTIONAL OUTCOME MEASURE FOTO = 56 (10/10/2019)  PROM L Knee  Flexion: L knee120after a few reps of overpressure - last measured 10/10/2019  Extension: 0*(after stretch) *indicates pain  AROM Knee  Flexion: 110  Extension:Lacking15during ASLR with estim.  *indicates pain    TREATMENT: 11/27/2019 MD visit:start gradual weight bearing 25% for 5 or 6 days then progress to 50% for 5 or 6 days, etc. Hinge brace must be on. ROM this  visit: 9-110 degrees   Therapeutic exercise: to centralize symptoms and improve ROM, strength, muscular endurance, and activity tolerance required for successful completion of functional activities.   -L quad set to A SLR with [place] NMES estim. On/Off4/12secondsto startup to75-33m intensity (to pt tolerance with palpable contraction).With active quad contraction. burst frequency80bps, duty cycle 50%, ramp 5 second. 2"x4" oval pads placed at proximal and distal L quad.558m of active runningtime in each positionwith extra time forset up for estim and and removal of pads.Lacking15degrees in active extension with estimbutcontinueswith good contraction this  session.  -Hooklying bridge 2x15 (second set with isometric hip abduction against green theraband),  - isometric bridge with dynamic hip abduction against green theraband, 2x10  BUE support in standing:  -Standing LLE straight leg hip extension/abduction (to 7:30 or 45 degrees) 1x15 (medius remains quite weak/fatiguable) -Standing LLE knee flexion1x15 -Standing LLE straight leg hip extension 1x15 -Standing LLE straight leg hip abduction 1x15 -Standing LLE TKE 1x15    PT Education - 10/10/19 1217    Education Details  exercise purpose/form. self management techniques.    Person(s) Educated  Patient    Methods  Explanation;Demonstration;Tactile cues;Verbal cues    Comprehension  Verbalized understanding;Returned demonstration;Verbal cues required;Tactile cues required;Need further instruction       PT Short Term Goals - 08/14/19 1328      PT SHORT TERM GOAL #1   Title  Be independent with initial home exercise program for self-management of symptoms.    Baseline  initial HEP provided at IE (04/18/2019);    Time  2    Period  Weeks    Status  Achieved    Target Date  05/02/19        PT Long Term Goals - 09/28/19 2016      PT LONG TERM GOAL #1   Title  Be independent with a long-term home exercise program for self-management of symptoms.    Baseline  initial HEP provided at IE (04/18/2019); continues to participate in currently appropriate HEP (05/18/2019; 06/29/2019); updated HEP as appropriate for post-op rehab (08/14/2019); currently participating in appropriate HEP, has not yet received final long term HEP (08/24/2019; 09/26/2019);    Time  12    Period  Weeks    Status  Partially Met    Target Date  11/06/19      PT LONG TERM GOAL #2   Title  Demonstrate improved FOTO score by 10 units to demonstrate improvement in overall condition and self-reported functional ability.    Baseline  44 (04/18/2019); 52 (05/18/2019); 52 (06/29/2019); FOTO = 40 (08/14/2019); 51 (09/26/2019);     Time  12    Period  Weeks    Status  Partially Met    Target Date  11/06/19      PT LONG TERM GOAL #3   Title  Complete community, work and/or recreational activities without limitation due to current condition.    Baseline  Difficulites with walking, standing, shopping, driving, cooking and bathing, currently NWB L LE (04/18/2019); continues with similar difficulties but is now PWB25% and is able to drive, cross legs to don shoes (05/18/2019); improving but continues to have limitations and cannot perform activities that require single leg stance full weight bearing (06/29/2019); currently limited similarly to intial eval due to recent surgery 08/03/2019 to repair non-union fracture (08/14/2019); continues to have similar limitations (08/24/2019); is improving again but is not yet weight bearing that produces similar limitations, able to shower in 30 min independently now (09/26/2019);  Time  12    Period  Weeks    Status  Partially Met    Target Date  11/06/19      PT LONG TERM GOAL #4   Title  Increase L knee flexion AROM to 140 degrees to be able to complete ADLs without limitation of knee flexion    Baseline  PROM 95 degrees of flexion with pain at end range and AROM 80 degrees(04/18/2019); AAROM 110 with end range pain (05/18/2019); PROM 0-126, AROM ext -25, AAROM flex 115 (06/29/2019); PROM lacking 10 degrees extension, up to 100 degrees flexion (with brace on) (08/14/2019); PROM extension lacking ~ 2 degrees, PROM flexion: 110 degrees (08/24/2019); PROM extension to 0 degrees, PROM flexion 122 degrees (09/26/2019);    Time  12    Period  Weeks    Status  Partially Met    Target Date  11/06/19      PT LONG TERM GOAL #5   Title  Pt will increase LE strength to equal or greater than 4+/5 MMT in order to demonstrate improvement in strength and function.    Baseline  See objectives on IE(04/18/2019); still 2/10 L knee extension (lacking last 22 degrees against gravity) (05/18/2019); continues  to lack terminal knee extension in open change position, improved to at least 4/5 with discomfort within available range (06/29/2019); has decreased in since last session due to recent surgery and away from PT 2 weeks (08/14/2019); 25 degree extensor lag (08/24/2019); has 15 degree extensor lag with etim, MMT improving - see objective (09/26/2019);    Time  12    Period  Weeks    Status  Partially Met    Target Date  11/06/19            Plan - 10/10/19 1219    Clinical Impression Statement  Patient tolerated treatment well with no increase in pain overall. Required cuing for improved glute contraction with standing exercises and to maximize proper form. Noted for less swelling today and good flexion ROM with minimal stretching. No visible instability in lower leg with weight bearing (while wearing brace). Improved FOTO score. Patient would benefit from continued management of limiting condition by skilled physical therapist to address remaining impairments and functional limitations to work towards stated goals and return to PLOF or maximal functional independence.    Personal Factors and Comorbidities  Comorbidity 2;Age    Comorbidities  L ankle surgery; hypertension; history of falling; L knee pain.    Examination-Activity Limitations  Bathing;Bed Mobility;Locomotion Level;Stairs;Stand;Hygiene/Grooming;Bend;Caring for Others;Sleep;Lift;Transfers;Toileting;Squat;Carry    Examination-Participation Restrictions  Yard Work;Driving;Meal Prep;Laundry;Shop;Cleaning    Stability/Clinical Decision Making  Evolving/Moderate complexity    Rehab Potential  Good    PT Frequency  2x / week    PT Duration  2 weeks    PT Treatment/Interventions  ADLs/Self Care Home Management;Cryotherapy;Moist Heat;Gait training;Stair training;Functional mobility training;Therapeutic activities;Therapeutic exercise;Balance training;Neuromuscular re-education;Manual techniques;Joint Manipulations;Passive range of motion;Dry  needling;DME Instruction    PT Next Visit Plan  Strengthening, ROM and muscle endurance, gait training when appropriate    PT Home Exercise Plan  Medbridge: [email or web address]    Consulted and Agree with Plan of Care  Patient    Family Member Consulted  Wife       Patient will benefit from skilled therapeutic intervention in order to improve the following deficits and impairments:  Abnormal gait, Decreased endurance, Decreased activity tolerance, Decreased range of motion, Decreased strength, Pain, Impaired flexibility, Difficulty walking, Decreased balance, Impaired perceived functional ability, Decreased knowledge of use  of DME, Decreased mobility  Visit Diagnosis: Acute pain of left knee  Unsteadiness on feet  Muscle weakness (generalized)     Problem List Patient Active Problem List   Diagnosis Date Noted  . Hypertension   . Hyperlipidemia   . Asthma   . Tibial plateau fracture, left, closed, with nonunion, subsequent encounter 08/03/2019    Everlean Alstrom. Graylon Good, PT, DPT 10/10/19, 12:21 PM  [facility] [address], [place], [address] Phone: [phone]   Fax:  [phone]  Name: Glenn Watson MRN: [id number] Date of Birth: [birth year]

## 2019-10-12 ENCOUNTER — Other Ambulatory Visit: Payer: Self-pay

## 2019-10-12 ENCOUNTER — Ambulatory Visit: Payer: PPO | Admitting: Physical Therapy

## 2019-10-12 ENCOUNTER — Encounter: Payer: Self-pay | Admitting: Physical Therapy

## 2019-10-12 DIAGNOSIS — M25562 Pain in left knee: Secondary | ICD-10-CM

## 2019-10-12 DIAGNOSIS — R2681 Unsteadiness on feet: Secondary | ICD-10-CM

## 2019-10-12 DIAGNOSIS — M6281 Muscle weakness (generalized): Secondary | ICD-10-CM

## 2019-10-12 NOTE — Therapy (Signed)
[facility] [address] [place], [address] Phone: [phone]   Fax:  [phone]  Physical Therapy Treatment  Patient Details  Name: Glenn Watson MRN: [id number] Date of Birth: 1945/10/18 Referring Provider (PT): Altamese [place], MD   Encounter Date: 10/12/2019  PT End of Session - 10/12/19 1133    Visit Number  44    Number of Visits  79    Date for PT Re-Evaluation  11/06/19    Authorization Type  [employer] reporting period from 08/29/2019    Authorization Time Period  N/A    Authorization - Visit Number  4    Authorization - Number of Visits  10    Progress Note Due on Visit  72   Next FOTO due 4/27   PT Start Time  1120    PT Stop Time  1200    PT Time Calculation (min)  40 min    Equipment Utilized During Treatment  Gait belt    Activity Tolerance  Patient tolerated treatment well;No increased pain    Behavior During Therapy  WFL for tasks assessed/performed       Past Medical History:  Diagnosis Date  . Anemia   . Asthma    as a child, exercise asthma - none since high school"  . Cancer ([place])    skin cancer - basal, squamous- Mylenoma - stage 2- chest  . Complication of anesthesia    "very sensitive to medications" "Had colonscopy with just Draminine"  . Family history of adverse reaction to anesthesia    daughter- N/V  . Hyperlipidemia   . Hypertension   . PONV (postoperative nausea and vomiting)     Past Surgical History:  Procedure Laterality Date  . APPENDECTOMY  2011   Dr. Bary Castilla  . COLONOSCOPY WITH PROPOFOL N/A 02/10/2017   Procedure: COLONOSCOPY WITH PROPOFOL;  Surgeon: Christene Lye, MD;  Location: [facility];  Service: Endoscopy;  Laterality: N/A;  . ORIF TIBIA PLATEAU Left 08/03/2019   Procedure: OPEN REDUCTION INTERNAL FIXATION (ORIF) TIBIAL PLATEAU;  Surgeon: Altamese Luke, MD;  Location: [facility];  Service: Orthopedics;  Laterality: Left;     There were no vitals filed for this visit.  Subjective Assessment - 10/12/19 1132    Subjective  Patient reports he is feeling well and has no pain upon arrival. Felt fine following last treatment session. Started going up and down the stairs on Sunday with one axial crutch and this is going well.    Pertinent History  L ankle surgery; hypertension; history of falling; L knee pain.    Limitations  Lifting;Standing;Walking;House hold activities    How long can you stand comfortably?  can do dishes    Diagnostic tests  CT scan L knee report 07/18/2019 "IMPRESSION: 1. 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 2. Partial healing of the impacted fracture of the proximal fibula. 3. Moderate knee joint effusion."    Currently in Pain?  No/denies       OBJECTIVE:  FUNCTIONAL OUTCOME MEASURE FOTO = 56(10/10/2019)  PROM L Knee  Flexion: L knee120after a few reps of overpressure - last measured 10/12/2019  Extension: 0*(after stretch) *indicates pain  AROM Knee  Flexion: 114  Extension:Lacking7 degreesduring ASLR with estim.  *indicates pain    TREATMENT: 11/27/2019 MD visit:start gradual weight bearing 25% for 5 or 6 days then progress to 50% for 5 or 6 days, etc. Hinge brace must be  on. ROM this visit: 9-110 degrees  Therapeutic exercise:to centralize symptoms and improve ROM, strength, muscular endurance, and activity tolerance required for successful completion of functional activities.   - supine L knee extension stretch with intermittent quad set heel elevated, toes strapped to maintain neutral, 10# ankle weight over knee. x27mn. Improved from lacking5degrees to nearly0degrees.  -L quad set to A SLR with [place]. On/Off5/5secondsto startup to890mintensity (to pt tolerance with palpable contraction).With active quad contraction. burst frequency80bps, duty cycle 50%, ramp 5 second. 2"x4" oval pads  placed at proximal and distal L quad.64m88mof active runningtime in each positionwith extra time forset up for estim and and removal of pads.Lacking7degrees in active extension with estimbutcontinueswith good contraction this session.  -Hooklying bridge with isometric hip abduction against green theraband, 2x15, 5secH   Standing in BUE support in standing:  - Squats 3x10. Cuing for proper form.  - standing heel raises, x20  - L gastroc stretch, 2x30 seconds -Standing LLE straight leg hip extension/abduction (to 7:30 or 45 degrees) 1x15 (medius remains quite weak/fatiguable)  Manual therapy: to reduce pain and tissue tension, improve range of motion, neuromodulation, in order to promote improved ability to complete functional activities. - supine PROM L knee flexion with OP to tolerance, x10.     PT Education - 10/12/19 1133    Education Details  exercise purpose/form. self management techniques.    Person(s) Educated  Patient    Methods  Explanation;Demonstration;Tactile cues;Verbal cues    Comprehension  Verbalized understanding;Returned demonstration;Verbal cues required;Tactile cues required       PT Short Term Goals - 08/14/19 1328      PT SHORT TERM GOAL #1   Title  Be independent with initial home exercise program for self-management of symptoms.    Baseline  initial HEP provided at IE (04/18/2019);    Time  2    Period  Weeks    Status  Achieved    Target Date  05/02/19        PT Long Term Goals - 09/28/19 2016      PT LONG TERM GOAL #1   Title  Be independent with a long-term home exercise program for self-management of symptoms.    Baseline  initial HEP provided at IE (04/18/2019); continues to participate in currently appropriate HEP (05/18/2019; 06/29/2019); updated HEP as appropriate for post-op rehab (08/14/2019); currently participating in appropriate HEP, has not yet received final long term HEP (08/24/2019; 09/26/2019);    Time  12    Period  Weeks     Status  Partially Met    Target Date  11/06/19      PT LONG TERM GOAL #2   Title  Demonstrate improved FOTO score by 10 units to demonstrate improvement in overall condition and self-reported functional ability.    Baseline  44 (04/18/2019); 52 (05/18/2019); 52 (06/29/2019); FOTO = 40 (08/14/2019); 51 (09/26/2019);    Time  12    Period  Weeks    Status  Partially Met    Target Date  11/06/19      PT LONG TERM GOAL #3   Title  Complete community, work and/or recreational activities without limitation due to current condition.    Baseline  Difficulites with walking, standing, shopping, driving, cooking and bathing, currently NWB L LE (04/18/2019); continues with similar difficulties but is now PWB25% and is able to drive, cross legs to don shoes (05/18/2019); improving but continues to have limitations and cannot perform activities that require single  leg stance full weight bearing (06/29/2019); currently limited similarly to intial eval due to recent surgery 08/03/2019 to repair non-union fracture (08/14/2019); continues to have similar limitations (08/24/2019); is improving again but is not yet weight bearing that produces similar limitations, able to shower in 30 min independently now (09/26/2019);    Time  12    Period  Weeks    Status  Partially Met    Target Date  11/06/19      PT LONG TERM GOAL #4   Title  Increase L knee flexion AROM to 140 degrees to be able to complete ADLs without limitation of knee flexion    Baseline  PROM 95 degrees of flexion with pain at end range and AROM 80 degrees(04/18/2019); AAROM 110 with end range pain (05/18/2019); PROM 0-126, AROM ext -25, AAROM flex 115 (06/29/2019); PROM lacking 10 degrees extension, up to 100 degrees flexion (with brace on) (08/14/2019); PROM extension lacking ~ 2 degrees, PROM flexion: 110 degrees (08/24/2019); PROM extension to 0 degrees, PROM flexion 122 degrees (09/26/2019);    Time  12    Period  Weeks    Status  Partially Met     Target Date  11/06/19      PT LONG TERM GOAL #5   Title  Pt will increase LE strength to equal or greater than 4+/5 MMT in order to demonstrate improvement in strength and function.    Baseline  See objectives on IE(04/18/2019); still 2/10 L knee extension (lacking last 22 degrees against gravity) (05/18/2019); continues to lack terminal knee extension in open change position, improved to at least 4/5 with discomfort within available range (06/29/2019); has decreased in since last session due to recent surgery and away from PT 2 weeks (08/14/2019); 25 degree extensor lag (08/24/2019); has 15 degree extensor lag with etim, MMT improving - see objective (09/26/2019);    Time  12    Period  Weeks    Status  Partially Met    Target Date  11/06/19            Plan - 10/12/19 1219    Clinical Impression Statement  Patient tolerated treatment well overall with no increase in pain. Noted some discomfort at anterior L tibia last three squats that resolved with rest. Patient continues to progress towards goals. Patient would benefit from continued management of limiting condition by skilled physical therapist to address remaining impairments and functional limitations to work towards stated goals and return to PLOF or maximal functional independence.    Personal Factors and Comorbidities  Comorbidity 2;Age    Comorbidities  L ankle surgery; hypertension; history of falling; L knee pain.    Examination-Activity Limitations  Bathing;Bed Mobility;Locomotion Level;Stairs;Stand;Hygiene/Grooming;Bend;Caring for Others;Sleep;Lift;Transfers;Toileting;Squat;Carry    Examination-Participation Restrictions  Yard Work;Driving;Meal Prep;Laundry;Shop;Cleaning    Stability/Clinical Decision Making  Evolving/Moderate complexity    Rehab Potential  Good    PT Frequency  2x / week    PT Duration  2 weeks    PT Treatment/Interventions  ADLs/Self Care Home Management;Cryotherapy;Moist Heat;Gait training;Stair  training;Functional mobility training;Therapeutic activities;Therapeutic exercise;Balance training;Neuromuscular re-education;Manual techniques;Joint Manipulations;Passive range of motion;Dry needling;DME Instruction    PT Next Visit Plan  Strengthening, ROM and muscle endurance, gait training when appropriate    PT Home Exercise Plan  Medbridge: [email or web address]    Consulted and Agree with Plan of Care  Patient    Family Member Consulted  Wife       Patient will benefit from skilled therapeutic intervention in order to improve the following  deficits and impairments:  Abnormal gait, Decreased endurance, Decreased activity tolerance, Decreased range of motion, Decreased strength, Pain, Impaired flexibility, Difficulty walking, Decreased balance, Impaired perceived functional ability, Decreased knowledge of use of DME, Decreased mobility  Visit Diagnosis: Acute pain of left knee  Unsteadiness on feet  Muscle weakness (generalized)     Problem List Patient Active Problem List   Diagnosis Date Noted  . Hypertension   . Hyperlipidemia   . Asthma   . Tibial plateau fracture, left, closed, with nonunion, subsequent encounter 08/03/2019    Everlean Alstrom. Graylon Good, PT, DPT 10/12/19, 12:21 PM  [facility] [address] [place], [address] Phone: [phone]   Fax:  [phone]  Name: Glenn Watson MRN: [id number] Date of Birth: 1945-12-20

## 2019-10-16 ENCOUNTER — Encounter: Payer: Self-pay | Admitting: Physical Therapy

## 2019-10-16 ENCOUNTER — Ambulatory Visit: Payer: PPO | Admitting: Physical Therapy

## 2019-10-16 ENCOUNTER — Other Ambulatory Visit: Payer: Self-pay

## 2019-10-16 DIAGNOSIS — M25562 Pain in left knee: Secondary | ICD-10-CM | POA: Diagnosis not present

## 2019-10-16 DIAGNOSIS — R2681 Unsteadiness on feet: Secondary | ICD-10-CM

## 2019-10-16 DIAGNOSIS — M6281 Muscle weakness (generalized): Secondary | ICD-10-CM

## 2019-10-16 NOTE — Therapy (Signed)
[facility] [address] [place], [address] Phone: [phone]   Fax:  [phone]  Physical Therapy Treatment  Patient Details  Name: Glenn Watson MRN: [id number] Date of Birth: 1946/04/09 Referring Provider (PT): Altamese [place], MD   Encounter Date: 10/16/2019  PT End of Session - 10/16/19 1004    Visit Number  45    Number of Visits  52    Date for PT Re-Evaluation  11/06/19    Authorization Type  [employer] reporting period from 08/29/2019    Authorization Time Period  N/A    Authorization - Visit Number  5    Authorization - Number of Visits  10    Progress Note Due on Visit  78   Next FOTO due 4/27   PT Start Time  0950    PT Stop Time  1030    PT Time Calculation (min)  40 min    Equipment Utilized During Treatment  Gait belt    Activity Tolerance  Patient tolerated treatment well;No increased pain    Behavior During Therapy  WFL for tasks assessed/performed       Past Medical History:  Diagnosis Date  . Anemia   . Asthma    as a child, exercise asthma - none since high school"  . Cancer ([place])    skin cancer - basal, squamous- Mylenoma - stage 2- chest  . Complication of anesthesia    "very sensitive to medications" "Had colonscopy with just Draminine"  . Family history of adverse reaction to anesthesia    daughter- N/V  . Hyperlipidemia   . Hypertension   . PONV (postoperative nausea and vomiting)     Past Surgical History:  Procedure Laterality Date  . APPENDECTOMY  2011   Dr. Bary Castilla  . COLONOSCOPY WITH PROPOFOL N/A 02/10/2017   Procedure: COLONOSCOPY WITH PROPOFOL;  Surgeon: Christene Lye, MD;  Location: [facility];  Service: Endoscopy;  Laterality: N/A;  . ORIF TIBIA PLATEAU Left 08/03/2019   Procedure: OPEN REDUCTION INTERNAL FIXATION (ORIF) TIBIAL PLATEAU;  Surgeon: Altamese [place], MD;  Location: [facility];  Service: Orthopedics;  Laterality: Left;     There were no vitals filed for this visit.  Subjective Assessment - 10/16/19 1002    Subjective  Patient reports he is feeling well with no pain upon arrival. He did 35 min on the recumbent bike with minimal resistance at the gym and some upper body work. Has been doing HEP    Pertinent History  L ankle surgery; hypertension; history of falling; L knee pain.    Limitations  Lifting;Standing;Walking;House hold activities    How long can you stand comfortably?  can do dishes    Diagnostic tests  CT scan L knee report 07/18/2019 "IMPRESSION: 1. 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 2. Partial healing of the impacted fracture of the proximal fibula. 3. Moderate knee joint effusion."    Currently in Pain?  No/denies       OBJECTIVE:  FUNCTIONAL OUTCOME MEASURE FOTO = 56(10/10/2019)  PROM LKnee  Flexion: L knee125after manual - last measured 10/16/2019  Extension: 0*(after manual) *indicates pain  AROM Knee  Extension:Lacking5 degreesduring ASLR withestim. *indicates pain   TREATMENT: 11/27/2019 MD visit:start gradual weight bearing 25% for 5 or 6 days then progress to 50% for 5 or 6 days, etc. Hinge brace must be on. ROM this visit: 9-110 degrees  Therapeutic exercise:to centralize symptoms and improve  ROM, strength, muscular endurance, and activity tolerance required for successful completion of functional activities.  -Lquad set to A SLR with [place] NMES estim. On/Off5/5secondsto startup to45m intensity (to pt tolerance with palpable contraction).With active quad contraction. burst frequency80bps, duty cycle 50%, ramp 5 second. 2"x4" oval pads placed at proximal and distal L quad.563m of active runningtime in each positionwith extra time forset up for estim and and removal of pads.Lacking5degrees in active extension with estimbutcontinueswith goodcontraction this session. -Hooklying bridge  with isometric hip abduction against black theraband, 2x15, 5secH  Standing in BUE support in standing: - Squats 3x15. Cuing for proper form and to shift weight towards left. - standing heel raises with balls of feet on 2x4 board for greater ankle ROM, 2x20 -Standing LLE straight leg hip extension/abduction with red theraband looped around distal thighs (to 7:30 or 45 degrees) 1x17, 1x20 - standing L hip flexion (knee flexed) with red theraband looped around distal thighs, 1x18, 1x20.    Manual therapy: to reduce pain and tissue tension, improve range of motion, neuromodulation, in order to promote improved ability to complete functional activities. - supine PROM L knee flexion with OP to tolerance, x10.  - hooklying AP L tibiofemoral mobilizations grades III-IV - supine PROM L knee extension with OP to tolerance, x 10    PT Education - 10/16/19 1004    Education Details  exercise purpose/form. self management techniques.    Person(s) Educated  Patient    Methods  Explanation;Demonstration;Tactile cues;Verbal cues    Comprehension  Verbalized understanding;Returned demonstration;Verbal cues required;Tactile cues required;Need further instruction       PT Short Term Goals - 08/14/19 1328      PT SHORT TERM GOAL #1   Title  Be independent with initial home exercise program for self-management of symptoms.    Baseline  initial HEP provided at IE (04/18/2019);    Time  2    Period  Weeks    Status  Achieved    Target Date  05/02/19        PT Long Term Goals - 09/28/19 2016      PT LONG TERM GOAL #1   Title  Be independent with a long-term home exercise program for self-management of symptoms.    Baseline  initial HEP provided at IE (04/18/2019); continues to participate in currently appropriate HEP (05/18/2019; 06/29/2019); updated HEP as appropriate for post-op rehab (08/14/2019); currently participating in appropriate HEP, has not yet received final long term HEP  (08/24/2019; 09/26/2019);    Time  12    Period  Weeks    Status  Partially Met    Target Date  11/06/19      PT LONG TERM GOAL #2   Title  Demonstrate improved FOTO score by 10 units to demonstrate improvement in overall condition and self-reported functional ability.    Baseline  44 (04/18/2019); 52 (05/18/2019); 52 (06/29/2019); FOTO = 40 (08/14/2019); 51 (09/26/2019);    Time  12    Period  Weeks    Status  Partially Met    Target Date  11/06/19      PT LONG TERM GOAL #3   Title  Complete community, work and/or recreational activities without limitation due to current condition.    Baseline  Difficulites with walking, standing, shopping, driving, cooking and bathing, currently NWB L LE (04/18/2019); continues with similar difficulties but is now PWB25% and is able to drive, cross legs to don shoes (05/18/2019); improving but continues to have limitations and cannot  perform activities that require single leg stance full weight bearing (06/29/2019); currently limited similarly to intial eval due to recent surgery 08/03/2019 to repair non-union fracture (08/14/2019); continues to have similar limitations (08/24/2019); is improving again but is not yet weight bearing that produces similar limitations, able to shower in 30 min independently now (09/26/2019);    Time  12    Period  Weeks    Status  Partially Met    Target Date  11/06/19      PT LONG TERM GOAL #4   Title  Increase L knee flexion AROM to 140 degrees to be able to complete ADLs without limitation of knee flexion    Baseline  PROM 95 degrees of flexion with pain at end range and AROM 80 degrees(04/18/2019); AAROM 110 with end range pain (05/18/2019); PROM 0-126, AROM ext -25, AAROM flex 115 (06/29/2019); PROM lacking 10 degrees extension, up to 100 degrees flexion (with brace on) (08/14/2019); PROM extension lacking ~ 2 degrees, PROM flexion: 110 degrees (08/24/2019); PROM extension to 0 degrees, PROM flexion 122 degrees (09/26/2019);    Time   12    Period  Weeks    Status  Partially Met    Target Date  11/06/19      PT LONG TERM GOAL #5   Title  Pt will increase LE strength to equal or greater than 4+/5 MMT in order to demonstrate improvement in strength and function.    Baseline  See objectives on IE(04/18/2019); still 2/10 L knee extension (lacking last 22 degrees against gravity) (05/18/2019); continues to lack terminal knee extension in open change position, improved to at least 4/5 with discomfort within available range (06/29/2019); has decreased in since last session due to recent surgery and away from PT 2 weeks (08/14/2019); 25 degree extensor lag (08/24/2019); has 15 degree extensor lag with etim, MMT improving - see objective (09/26/2019);    Time  12    Period  Weeks    Status  Partially Met    Target Date  11/06/19            Plan - 10/16/19 1048    Clinical Impression Statement  Patient tolerated treatment well and continues to make progress in L knee ROM and quad activation. He was also able to continue gradual progression of closed chain exercises. Patient has not yet progressed to L weight single leg exercises except brief weightbearing during ambulation using AD. Patient would benefit from continued management of limiting condition by skilled physical therapist to address remaining impairments and functional limitations to work towards stated goals and return to PLOF or maximal functional independence.    Personal Factors and Comorbidities  Comorbidity 2;Age    Comorbidities  L ankle surgery; hypertension; history of falling; L knee pain.    Examination-Activity Limitations  Bathing;Bed Mobility;Locomotion Level;Stairs;Stand;Hygiene/Grooming;Bend;Caring for Others;Sleep;Lift;Transfers;Toileting;Squat;Carry    Examination-Participation Restrictions  Yard Work;Driving;Meal Prep;Laundry;Shop;Cleaning    Stability/Clinical Decision Making  Evolving/Moderate complexity    Rehab Potential  Good    PT Frequency  2x /  week    PT Duration  2 weeks    PT Treatment/Interventions  ADLs/Self Care Home Management;Cryotherapy;Moist Heat;Gait training;Stair training;Functional mobility training;Therapeutic activities;Therapeutic exercise;Balance training;Neuromuscular re-education;Manual techniques;Joint Manipulations;Passive range of motion;Dry needling;DME Instruction    PT Next Visit Plan  Strengthening, ROM and muscle endurance, gait training when appropriate    PT Home Exercise Plan  Medbridge: [email or web address]    Consulted and Agree with Plan of Care  Patient    Family Member Consulted  Wife       Patient will benefit from skilled therapeutic intervention in order to improve the following deficits and impairments:  Abnormal gait, Decreased endurance, Decreased activity tolerance, Decreased range of motion, Decreased strength, Pain, Impaired flexibility, Difficulty walking, Decreased balance, Impaired perceived functional ability, Decreased knowledge of use of DME, Decreased mobility  Visit Diagnosis: Acute pain of left knee  Unsteadiness on feet  Muscle weakness (generalized)     Problem List Patient Active Problem List   Diagnosis Date Noted  . Hypertension   . Hyperlipidemia   . Asthma   . Tibial plateau fracture, left, closed, with nonunion, subsequent encounter 08/03/2019    Everlean Alstrom. Graylon Good, PT, DPT 10/16/19, 10:49 AM  [facility] [address] [place], [address] Phone: [phone]   Fax:  [phone]  Name: Glenn Watson MRN: [id number] Date of Birth: 06-Aug-1945

## 2019-10-17 ENCOUNTER — Ambulatory Visit: Payer: PPO | Admitting: Physical Therapy

## 2019-10-18 ENCOUNTER — Ambulatory Visit: Payer: PPO | Admitting: Physical Therapy

## 2019-10-18 ENCOUNTER — Other Ambulatory Visit: Payer: Self-pay

## 2019-10-18 DIAGNOSIS — M25562 Pain in left knee: Secondary | ICD-10-CM | POA: Diagnosis not present

## 2019-10-18 DIAGNOSIS — R2681 Unsteadiness on feet: Secondary | ICD-10-CM

## 2019-10-18 DIAGNOSIS — M6281 Muscle weakness (generalized): Secondary | ICD-10-CM

## 2019-10-18 NOTE — Therapy (Signed)
[facility] [address], [place], [address] Phone: [phone]   Fax:  [phone]  Physical Therapy Treatment  Patient Details  Name: Glenn Watson MRN: [id number] Date of Birth: 08-25-1945 Referring Provider (PT): Altamese [place], MD   Encounter Date: 10/18/2019  PT End of Session - 10/18/19 0956    Visit Number  46    Number of Visits  52    Date for PT Re-Evaluation  11/06/19    Authorization Type  [employer] reporting period from 08/29/2019    Authorization Time Period  N/A    Authorization - Visit Number  6    Authorization - Number of Visits  10    Progress Note Due on Visit  33   Next FOTO due 4/27   PT Start Time  0950    PT Stop Time  1028    PT Time Calculation (min)  38 min    Equipment Utilized During Treatment  --   hinged L knee brace in unlocked position during WB activities   Activity Tolerance  Patient tolerated treatment well;No increased pain    Behavior During Therapy  WFL for tasks assessed/performed       Past Medical History:  Diagnosis Date  . Anemia   . Asthma    as a child, exercise asthma - none since high school"  . Cancer ([place])    skin cancer - basal, squamous- Mylenoma - stage 2- chest  . Complication of anesthesia    "very sensitive to medications" "Had colonscopy with just Draminine"  . Family history of adverse reaction to anesthesia    daughter- N/V  . Hyperlipidemia   . Hypertension   . PONV (postoperative nausea and vomiting)     Past Surgical History:  Procedure Laterality Date  . APPENDECTOMY  2011   Dr. Bary Castilla  . COLONOSCOPY WITH PROPOFOL N/A 02/10/2017   Procedure: COLONOSCOPY WITH PROPOFOL;  Surgeon: Christene Lye, MD;  Location: [facility];  Service: Endoscopy;  Laterality: N/A;  . ORIF TIBIA PLATEAU Left 08/03/2019   Procedure: OPEN REDUCTION INTERNAL FIXATION (ORIF) TIBIAL PLATEAU;  Surgeon: Altamese [place], MD;  Location:  [facility];  Service: Orthopedics;  Laterality: Left;    There were no vitals filed for this visit.  Subjective Assessment - 10/18/19 0952    Subjective  Patient reports he is feeling well today. No pain upon arrival. Was sore over the proximal anterior tiba following last treatment session but it resolved. He did 17 min on level 3 on the recumbent bike at the gym, then 6 more min at level 2 before getting too tired.    Pertinent History  L ankle surgery; hypertension; history of falling; L knee pain.    Limitations  Lifting;Standing;Walking;House hold activities    How long can you stand comfortably?  can do dishes    Diagnostic tests  CT scan L knee report 07/18/2019 "IMPRESSION: 1. 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 2. Partial healing of the impacted fracture of the proximal fibula. 3. Moderate knee joint effusion."    Currently in Pain?  No/denies       OBJECTIVE:  FUNCTIONAL OUTCOME MEASURE FOTO = 56(10/10/2019)  PROM LKnee  Flexion: L knee120after manual - last measured 10/18/2019  Extension: 0*(after manual) *indicates pain  AROM Knee  Extension:Lacking3 degreesduring ASLR withestim. *indicates pain   TREATMENT: 11/27/2019 MD visit:start gradual weight bearing 25% for 5 or 6 days then  progress to 50% for 5 or 6 days, etc. Hinge brace must be on during weight bearing.   Therapeutic exercise:to centralize symptoms and improve ROM, strength, muscular endurance, and activity tolerance required for successful completion of functional activities.  -Lquad set to A SLR with [place] NMES estim. On/Off5/5secondsto startup to49m intensity (to pt tolerance with palpable contraction).With active quad contraction. burst frequency80bps, duty cycle 50%, ramp 5 second. 2"x4" oval pads placed at proximal and distal L quad.584m of active runningtime in each positionwith extra time forset up for estim and and removal  of pads.Lacking5degrees in active extension with estimbutcontinueswith goodcontraction this session.  Standing inBUE support in standing: -Squats3x15. Cuing for proper form and to shift weight towards left.  - standing pre-gait step with hip shift forward and backward with L LE with L UE support. X10. Pt shows excellent weight exeptance on L LE for this phase   Manual therapy:to reduce pain and tissue tension, improve range of motion, neuromodulation, in order to promote improved ability to complete functional activities.  - hooklying AP L tibiofemoral mobilizations grades III-IV - supine PROM L knee extension with OP to tolerance, x 20    PT Education - 10/18/19 0955    Education Details  exercise purpose/form. self management techniques.    Person(s) Educated  Patient    Methods  Explanation;Demonstration;Tactile cues;Verbal cues    Comprehension  Verbalized understanding;Returned demonstration;Verbal cues required;Tactile cues required;Need further instruction       PT Short Term Goals - 08/14/19 1328      PT SHORT TERM GOAL #1   Title  Be independent with initial home exercise program for self-management of symptoms.    Baseline  initial HEP provided at IE (04/18/2019);    Time  2    Period  Weeks    Status  Achieved    Target Date  05/02/19        PT Long Term Goals - 09/28/19 2016      PT LONG TERM GOAL #1   Title  Be independent with a long-term home exercise program for self-management of symptoms.    Baseline  initial HEP provided at IE (04/18/2019); continues to participate in currently appropriate HEP (05/18/2019; 06/29/2019); updated HEP as appropriate for post-op rehab (08/14/2019); currently participating in appropriate HEP, has not yet received final long term HEP (08/24/2019; 09/26/2019);    Time  12    Period  Weeks    Status  Partially Met    Target Date  11/06/19      PT LONG TERM GOAL #2   Title  Demonstrate improved FOTO score by 10 units  to demonstrate improvement in overall condition and self-reported functional ability.    Baseline  44 (04/18/2019); 52 (05/18/2019); 52 (06/29/2019); FOTO = 40 (08/14/2019); 51 (09/26/2019);    Time  12    Period  Weeks    Status  Partially Met    Target Date  11/06/19      PT LONG TERM GOAL #3   Title  Complete community, work and/or recreational activities without limitation due to current condition.    Baseline  Difficulites with walking, standing, shopping, driving, cooking and bathing, currently NWB L LE (04/18/2019); continues with similar difficulties but is now PWB25% and is able to drive, cross legs to don shoes (05/18/2019); improving but continues to have limitations and cannot perform activities that require single leg stance full weight bearing (06/29/2019); currently limited similarly to intial eval due to recent surgery 08/03/2019 to repair  non-union fracture (08/14/2019); continues to have similar limitations (08/24/2019); is improving again but is not yet weight bearing that produces similar limitations, able to shower in 30 min independently now (09/26/2019);    Time  12    Period  Weeks    Status  Partially Met    Target Date  11/06/19      PT LONG TERM GOAL #4   Title  Increase L knee flexion AROM to 140 degrees to be able to complete ADLs without limitation of knee flexion    Baseline  PROM 95 degrees of flexion with pain at end range and AROM 80 degrees(04/18/2019); AAROM 110 with end range pain (05/18/2019); PROM 0-126, AROM ext -25, AAROM flex 115 (06/29/2019); PROM lacking 10 degrees extension, up to 100 degrees flexion (with brace on) (08/14/2019); PROM extension lacking ~ 2 degrees, PROM flexion: 110 degrees (08/24/2019); PROM extension to 0 degrees, PROM flexion 122 degrees (09/26/2019);    Time  12    Period  Weeks    Status  Partially Met    Target Date  11/06/19      PT LONG TERM GOAL #5   Title  Pt will increase LE strength to equal or greater than 4+/5 MMT in order to  demonstrate improvement in strength and function.    Baseline  See objectives on IE(04/18/2019); still 2/10 L knee extension (lacking last 22 degrees against gravity) (05/18/2019); continues to lack terminal knee extension in open change position, improved to at least 4/5 with discomfort within available range (06/29/2019); has decreased in since last session due to recent surgery and away from PT 2 weeks (08/14/2019); 25 degree extensor lag (08/24/2019); has 15 degree extensor lag with etim, MMT improving - see objective (09/26/2019);    Time  12    Period  Weeks    Status  Partially Met    Target Date  11/06/19            Plan - 10/18/19 1216    Clinical Impression Statement  Patient tolerated treatment well with no increase in pain or discomfort overall. Continues to shift away from L LE during squat but able to accept weight when provided tactile cuing. Demonstrate ability to accept weight on L LE with good hip shift in pre-gait activity, that is improved from lasts time patient attempted to progress to ambulating without RW. Extensor lag in ASLR is improving. Patient appears to be making good progress overall but is not back to PLOF.  Patient would benefit from continued management of limiting condition by skilled physical therapist to address remaining impairments and functional limitations to work towards stated goals and return to PLOF or maximal functional independence.    Personal Factors and Comorbidities  Comorbidity 2;Age    Comorbidities  L ankle surgery; hypertension; history of falling; L knee pain.    Examination-Activity Limitations  Bathing;Bed Mobility;Locomotion Level;Stairs;Stand;Hygiene/Grooming;Bend;Caring for Others;Sleep;Lift;Transfers;Toileting;Squat;Carry    Examination-Participation Restrictions  Yard Work;Driving;Meal Prep;Laundry;Shop;Cleaning    Stability/Clinical Decision Making  Evolving/Moderate complexity    Rehab Potential  Good    PT Frequency  2x / week     PT Duration  2 weeks    PT Treatment/Interventions  ADLs/Self Care Home Management;Cryotherapy;Moist Heat;Gait training;Stair training;Functional mobility training;Therapeutic activities;Therapeutic exercise;Balance training;Neuromuscular re-education;Manual techniques;Joint Manipulations;Passive range of motion;Dry needling;DME Instruction    PT Next Visit Plan  Strengthening, ROM and muscle endurance, gait training when appropriate    PT Home Exercise Plan  [employer]: [email or web address]    Consulted and Agree with Plan  of Care  Patient    Family Member Consulted  Wife       Patient will benefit from skilled therapeutic intervention in order to improve the following deficits and impairments:  Abnormal gait, Decreased endurance, Decreased activity tolerance, Decreased range of motion, Decreased strength, Pain, Impaired flexibility, Difficulty walking, Decreased balance, Impaired perceived functional ability, Decreased knowledge of use of DME, Decreased mobility  Visit Diagnosis: Acute pain of left knee  Unsteadiness on feet  Muscle weakness (generalized)     Problem List Patient Active Problem List   Diagnosis Date Noted  . Hypertension   . Hyperlipidemia   . Asthma   . Tibial plateau fracture, left, closed, with nonunion, subsequent encounter 08/03/2019    Everlean Alstrom. Graylon Good, PT, DPT 10/18/19, 12:17 PM  [facility] [address] [place], [address] Phone: [phone]   Fax:  [phone]  Name: TRINO HIGINBOTHAM MRN: [id number] Date of Birth: 1946-01-03

## 2019-10-24 ENCOUNTER — Ambulatory Visit: Payer: PPO | Admitting: Physical Therapy

## 2019-10-24 ENCOUNTER — Other Ambulatory Visit: Payer: Self-pay

## 2019-10-24 DIAGNOSIS — M6281 Muscle weakness (generalized): Secondary | ICD-10-CM

## 2019-10-24 DIAGNOSIS — M25562 Pain in left knee: Secondary | ICD-10-CM | POA: Diagnosis not present

## 2019-10-24 DIAGNOSIS — R2681 Unsteadiness on feet: Secondary | ICD-10-CM

## 2019-10-24 NOTE — Therapy (Signed)
[facility] [address], [place], [address] Phone: [phone]   Fax:  [phone]  Physical Therapy Treatment / Progress Note / Re-Certification Reporting Period: 10/03/2019 - 10/24/2019  Patient Details  Name: Glenn Watson MRN: [id number] Date of Birth: 04/06/1946 Referring Provider (PT): Altamese Bertsch-[place], MD   Encounter Date: 10/24/2019  PT End of Session - 10/24/19 1321    Visit Number  34    Number of Visits  10    Date for PT Re-Evaluation  11/06/19    Authorization Type  [employer] reporting period from 09/28/2019    Authorization Time Period  N/A    Authorization - Visit Number  7    Authorization - Number of Visits  10    Progress Note Due on Visit  34   Next FOTO due 5/11   PT Start Time  1300    PT Stop Time  1340    PT Time Calculation (min)  40 min    Equipment Utilized During Treatment  --   hinged L knee brace in unlocked position during WB activities   Activity Tolerance  Patient tolerated treatment well;No increased pain    Behavior During Therapy  WFL for tasks assessed/performed       Past Medical History:  Diagnosis Date  . Anemia   . Asthma    as a child, exercise asthma - none since high school"  . Cancer ([place])    skin cancer - basal, squamous- Mylenoma - stage 2- chest  . Complication of anesthesia    "very sensitive to medications" "Had colonscopy with just Draminine"  . Family history of adverse reaction to anesthesia    daughter- N/V  . Hyperlipidemia   . Hypertension   . PONV (postoperative nausea and vomiting)     Past Surgical History:  Procedure Laterality Date  . APPENDECTOMY  2011   Dr. Bary Castilla  . COLONOSCOPY WITH PROPOFOL N/A 02/10/2017   Procedure: COLONOSCOPY WITH PROPOFOL;  Surgeon: Christene Lye, MD;  Location: [facility];  Service: Endoscopy;  Laterality: N/A;  . ORIF TIBIA PLATEAU Left 08/03/2019   Procedure: OPEN REDUCTION  INTERNAL FIXATION (ORIF) TIBIAL PLATEAU;  Surgeon: Altamese Knight[place], MD;  Location: [facility];  Service: Orthopedics;  Laterality: Left;    There were no vitals filed for this visit.  Subjective Assessment - 10/24/19 1303    Subjective  Pateint reports no pain upon arrival. Friday he did 40 min on the recumbent bike with resistance of 2-3/10. He had a bit of soreness at the anterior left proximal tibia later on on friday and a bit the next morning but he didn't have to take anything for it. 40 min is the most he has ever done. He feels his HEP is going well and he is not having any problems. He is really feeling good about seeing his referring phsician tomorrow and hopes he will gets good mves. His left leg feels like it is accepting weight very well when he practices pre-gait steps. He is still working getting the bone stimulator. He thinks he may not need it and wants to see what the radiographs show. States he has been having a bit of low back pain that wakes him up, but doing leg lifts 2x a day helps take care of it. He is looking foward to being able to return to cooking and going to the grocery store when he is able to walk more independently.  Pertinent History  L ankle surgery; hypertension; history of falling; L knee pain.    Limitations  Lifting;Standing;Walking;House hold activities    How long can you stand comfortably?  can do dishes    Diagnostic tests  CT scan L knee report 07/18/2019 "IMPRESSION: 1. 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 2. Partial healing of the impacted fracture of the proximal fibula. 3. Moderate knee joint effusion."    Currently in Pain?  No/denies    Effect of Pain on Daily Activities  decreased abilitiy to complete any activity that requires weight bearing, bending/extending L knee, lifiting L LE, including basic ADLs, IADLs, household and community mobility, social activities, hobbies, running, stairs, hiking, etc.         OBJECTIVE:  [place] = 60 (10/24/2019)  MANUAL ASSESSMENT -mildlaxity noted inLproximal tibia in sagittal and frontal planes.  PROM Knee  Flexion: L kneePROM120(prior to stretching)125(following manual stretching)Extension:0 degrees *indicates pain  AROM Knee  Flexion: 110(prior to manualtechniques for flexion), 116 following manual (8x OP stretches).   Extension: Active straight leg raise, lacking 3 degrees *indicates pain   MUSCLE PERFORMANCE (MMT)  R/L 4+/4+Hip flexion 4+/4Hip abduction  4+/4+ Hip adduction 5/4+Hip extension (knee extended), tightness noted 5/4Knee extension (submax force applied by clinician on L) 5/4+Knee flexion (submax force applied by clinician on L).  5/4+Ankle Dorsiflexion 5/4+Ankle Plantarflexion *indicates pain Ankle eversion and great toe extension WFL bilaterally  Active SLR 3degree lag.   FUNCTIONAL TESTS:  Squat to parallel with BUE support while wearing unlocked hinge brace. Moderate to slight shift towards right that is improved with cuing.   standing pre-gait step with hip shift forward and backward with L LE with L UE support. X10. Pt shows excellent weight exeptance on L LE for this phase    TREATMENT: 11/27/2019 MD visit:start gradual weight bearing 25% for 5 or 6 days then progress to 50% for 5 or 6 days, etc. Hinge brace must be on during weight bearing.   Therapeutic exercise:to centralize symptoms and improve ROM, strength, muscular endurance, and activity tolerance required for successful completion of functional activities.  - measurements to assess progress (see above)  -Lquad set to A SLR with [place] NMES estim. On/Off5/5secondsto startup to30m intensity (to pt tolerance with palpable contraction).With active quad contraction. burst frequency80bps, duty cycle 50%, ramp 5 second. 2"x4" oval pads placed at proximal and distal L  quad.512m of active runningtime in each positionwith extra time forset up for estim and and removal of pads.Lacking5degrees in active extension with estimbutcontinueswith goodcontraction this session.  Standing inBUE support in standing: -Squats x15. Cuing for proper formand to shift weight towards left.  Manual therapy:to reduce pain and tissue tension, improve range of motion, neuromodulation, in order to promote improved ability to complete functional activities. - supine PROM L knee flexion with OP to tolerance, x 8   PT Education - 10/24/19 1307    Education Details  exercise purpose/form. self management techniques.    Person(s) Educated  Patient    Methods  Explanation;Demonstration;Tactile cues;Verbal cues    Comprehension  Verbalized understanding;Returned demonstration;Verbal cues required;Tactile cues required;Need further instruction       PT Short Term Goals - 08/14/19 1328      PT SHORT TERM GOAL #1   Title  Be independent with initial home exercise program for self-management of symptoms.    Baseline  initial HEP provided at IE (04/18/2019);    Time  2    Period  Weeks    Status  Achieved    Target Date  05/02/19        PT Long Term Goals - 10/24/19 1601      PT LONG TERM GOAL #1   Title  Be independent with a long-term home exercise program for self-management of symptoms.    Baseline  initial HEP provided at IE (04/18/2019); continues to participate in currently appropriate HEP (05/18/2019; 06/29/2019); updated HEP as appropriate for post-op rehab (08/14/2019); currently participating in appropriate HEP, has not yet received final long term HEP (08/24/2019; 09/26/2019; 10/24/2019);    Time  12    Period  Weeks    Status  Partially Met    Target Date  01/16/20      PT LONG TERM GOAL #2   Title  Demonstrate improved FOTO score by 10 units to demonstrate improvement in overall condition and self-reported functional ability.    Baseline  44  (04/18/2019); 52 (05/18/2019); 52 (06/29/2019); FOTO = 40 (08/14/2019); 51 (09/26/2019); 60 (10/24/2019);    Time  12    Period  Weeks    Status  Achieved    Target Date  01/16/20      PT LONG TERM GOAL #3   Title  Complete community, work and/or recreational activities without limitation due to current condition.    Baseline  Difficulites with walking, standing, shopping, driving, cooking and bathing, currently NWB L LE (04/18/2019); continues with similar difficulties but is now PWB25% and is able to drive, cross legs to don shoes (05/18/2019); improving but continues to have limitations and cannot perform activities that require single leg stance full weight bearing (06/29/2019); currently limited similarly to intial eval due to recent surgery 08/03/2019 to repair non-union fracture (08/14/2019); continues to have similar limitations (08/24/2019); is improving again but is not yet weight bearing that produces similar limitations, able to shower in 30 min independently now (09/26/2019); continues to improve but is still using an assistive device and WBAT (10/24/2019);    Time  12    Period  Weeks    Status  Partially Met    Target Date  01/16/20      PT LONG TERM GOAL #4   Title  Increase L knee flexion AROM to 140 degrees to be able to complete ADLs without limitation of knee flexion    Baseline  PROM 95 degrees of flexion with pain at end range and AROM 80 degrees(04/18/2019); AAROM 110 with end range pain (05/18/2019); PROM 0-126, AROM ext -25, AAROM flex 115 (06/29/2019); PROM lacking 10 degrees extension, up to 100 degrees flexion (with brace on) (08/14/2019); PROM extension lacking ~ 2 degrees, PROM flexion: 110 degrees (08/24/2019); PROM extension to 0 degrees, PROM flexion 122 degrees (09/26/2019); 0-116 AROM; PROM 0-125 (10/24/2019);    Time  12    Period  Weeks    Status  Partially Met    Target Date  01/16/20      PT LONG TERM GOAL #5   Title  Pt will increase LE strength to equal or greater  than 4+/5 MMT in order to demonstrate improvement in strength and function.    Baseline  See objectives on IE(04/18/2019); still 2/10 L knee extension (lacking last 22 degrees against gravity) (05/18/2019); continues to lack terminal knee extension in open change position, improved to at least 4/5 with discomfort within available range (06/29/2019); has decreased in since last session due to recent surgery and away from PT 2 weeks (08/14/2019); 25 degree extensor lag (08/24/2019);  has 15 degree extensor lag with etim, MMT improving - see objective (09/26/2019); improving, extensor lag now 3 degrees in ASLR (10/24/2019);    Time  12    Period  Weeks    Status  Partially Met    Target Date  01/16/20            Plan - 10/24/19 1600    Clinical Impression Statement  Patient has attended 77 physical therapy sessions this episode of care and continues to make steady progress towards goals since ORIF to L tibial plateau 08/03/2019. Over the past month, patient has successfully transitioned to weight bearing as tolerated with his rolling walker and is waiting to see referring physician to start weaning from walker and an assistive device all together. Patient's left quad activation, and left knee AROM has improved dramatically over the last month and he now has only a slight (3 degree) extensor lag during active straight leg raise and consistently shows full L knee extension when coming into the clinic. He is tolerating using recumbant bike for longer and longer periods of time as well as with resistance and his standing tolerance and ambulation distance has improved. He has been able to make dinner one night and use his walker to walk around his home outside further than before. He has improved L knee PROM to 0-125 and is able to complete an active SLR with 3 degree lag. Patient continues to demonstrate slight laxity in proximal L tibia to manual pressure Patient has not yet returned to PLOF and his recovery was  initially delayed by non-union of his original injury prior to surgery. Patient continues to demonstrate significant impairments such as deficits in muscle performance (strength/power/endurance), ROM, joint stiffness, tissue integrity, mobility, pain, weight bearing restrictions. These deficits limit the patient ability to perform things such as ADLs, IADLs, social participation, caring for others, engaging in hobbies (house working, cooking, and driving), and impairs his quality of life. Patient would benefit from continued management of limiting condition by skilled physical therapist to address remaining impairments and functional limitations to work towards stated goals and return to PLOF or maximal functional independence    Personal Factors and Comorbidities  Comorbidity 2;Age    Comorbidities  L ankle surgery; hypertension; history of falling; L knee pain.    Examination-Activity Limitations  Bathing;Bed Mobility;Locomotion Level;Stairs;Stand;Hygiene/Grooming;Bend;Caring for Others;Sleep;Lift;Transfers;Toileting;Squat;Carry    Examination-Participation Restrictions  Yard Work;Driving;Meal Prep;Laundry;Shop;Cleaning    Stability/Clinical Decision Making  Evolving/Moderate complexity    Rehab Potential  Good    PT Frequency  2x / week    PT Duration  12 weeks    PT Treatment/Interventions  ADLs/Self Care Home Management;Cryotherapy;Moist Heat;Gait training;Stair training;Functional mobility training;Therapeutic activities;Therapeutic exercise;Balance training;Neuromuscular re-education;Manual techniques;Joint Manipulations;Passive range of motion;Dry needling;DME Instruction    PT Next Visit Plan  Strengthening, ROM and muscle endurance, gait training when appropriate    PT Home Exercise Plan  [employer]: [email or web address]    Consulted and Agree with Plan of Care  Patient       Patient will benefit from skilled therapeutic intervention in order to improve the following deficits and impairments:  Abnormal  gait, Decreased endurance, Decreased activity tolerance, Decreased range of motion, Decreased strength, Pain, Impaired flexibility, Difficulty walking, Decreased balance, Impaired perceived functional ability, Decreased knowledge of use of DME, Decreased mobility  Visit Diagnosis: Acute pain of left knee  Unsteadiness on feet  Muscle weakness (generalized)     Problem List Patient Active Problem List   Diagnosis Date Noted  . Hypertension   .  Hyperlipidemia   . Asthma   . Tibial plateau fracture, left, closed, with nonunion, subsequent encounter 08/03/2019    Everlean Alstrom. Graylon Good, PT, DPT 10/24/19, 4:21 PM  [facility] [address] [place], [address] Phone: [phone]   Fax:  [phone]  Name: CHUKWUMA STRAUS MRN: [id number] Date of Birth: 01-Mar-1946

## 2019-10-25 DIAGNOSIS — S82142K Displaced bicondylar fracture of left tibia, subsequent encounter for closed fracture with nonunion: Secondary | ICD-10-CM | POA: Diagnosis not present

## 2019-10-25 DIAGNOSIS — S82102K Unspecified fracture of upper end of left tibia, subsequent encounter for closed fracture with nonunion: Secondary | ICD-10-CM | POA: Diagnosis not present

## 2019-10-26 ENCOUNTER — Encounter: Payer: PPO | Admitting: Physical Therapy

## 2019-10-31 ENCOUNTER — Encounter: Payer: Self-pay | Admitting: Physical Therapy

## 2019-10-31 ENCOUNTER — Other Ambulatory Visit: Payer: Self-pay

## 2019-10-31 ENCOUNTER — Ambulatory Visit: Payer: PPO | Attending: Orthopedic Surgery | Admitting: Physical Therapy

## 2019-10-31 DIAGNOSIS — M25562 Pain in left knee: Secondary | ICD-10-CM

## 2019-10-31 DIAGNOSIS — R2681 Unsteadiness on feet: Secondary | ICD-10-CM

## 2019-10-31 DIAGNOSIS — M6281 Muscle weakness (generalized): Secondary | ICD-10-CM

## 2019-10-31 NOTE — Therapy (Signed)
[facility] [address], [place], [address] Phone: [phone]   Fax:  [phone]  Physical Therapy Treatment  Patient Details  Name: Glenn Watson MRN: [id number] Date of Birth: 10-Nov-1945 Referring Provider (PT): Altamese Shoshoni, MD   Encounter Date: 10/31/2019  PT End of Session - 10/31/19 1338    Visit Number  48    Number of Visits  30    Date for PT Re-Evaluation  11/06/19    Authorization Type  [employer] reporting period from 09/28/2019    Authorization Time Period  N/A    Authorization - Visit Number  8    Authorization - Number of Visits  10    Progress Note Due on Visit  41   Next FOTO due 5/11   PT Start Time  1120    PT Stop Time  1203    PT Time Calculation (min)  43 min    Equipment Utilized During Treatment  Gait belt    Activity Tolerance  Patient tolerated treatment well;No increased pain    Behavior During Therapy  WFL for tasks assessed/performed       Past Medical History:  Diagnosis Date  . Anemia   . Asthma    as a child, exercise asthma - none since high school"  . Cancer ([place])    skin cancer - basal, squamous- Mylenoma - stage 2- chest  . Complication of anesthesia    "very sensitive to medications" "Had colonscopy with just Draminine"  . Family history of adverse reaction to anesthesia    daughter- N/V  . Hyperlipidemia   . Hypertension   . PONV (postoperative nausea and vomiting)     Past Surgical History:  Procedure Laterality Date  . APPENDECTOMY  2011   Dr. Bary Castilla  . COLONOSCOPY WITH PROPOFOL N/A 02/10/2017   Procedure: COLONOSCOPY WITH PROPOFOL;  Surgeon: Christene Lye, MD;  Location: [facility];  Service: Endoscopy;  Laterality: N/A;  . ORIF TIBIA PLATEAU Left 08/03/2019   Procedure: OPEN REDUCTION INTERNAL FIXATION (ORIF) TIBIAL PLATEAU;  Surgeon: Altamese Yellow Pine, MD;  Location: [facility];  Service: Orthopedics;  Laterality: Left;     There were no vitals filed for this visit.  Subjective Assessment - 10/31/19 1119    Subjective  Patinet reports he got great neww from Dr. Marcelino Scot. He was able to do the elliptical for 5 min while gone (limited by R leg). Yesterday he did elliptical again (goal of 7.5 min) for 8 min without a problem. Has been walking with two axial crutches until tomorrow as reccomended by Dr. Marcelino Scot. Was instructed to progress each week to one axial crutch, then a SPC for balance. Dr. Marcelino Scot reccomended he start the elliptical machine instead of the recumbent bike and that he can use the leg press. He was able to press 80# with B LE yesterday at the gym. He has been walking around the house some without an AD while using hands to stablizie himself on furnature and walls. Still using single crutch and handrail to go up and down stairs. When he came in to see Dr. Marcelino Scot last week, he showed the doctor how he could shift weight, so Dr. Marcelino Scot asked him to walk about 20 feet across the room without an AD, which he was able to do. Pt reports radiograph showed healing and he no longer needs the bone stimulator. He also no longer needs to wear his hinged brace. Patient reports no  pain upon arrival and is in good spirits. He states he really wants to get back cooking more (he was the primary meal -preparer prior to injury) and he has been spending a bit more time in the kitchen but must plan his moves carefully to keep from needing to walk across the room excessive amount of times. He weighted while at the dr and found he only gained 5 lbs since his fall, so he feels he has been doing pretty good managing his weight during his injury induced activity limitations. He would also like to be able to go up and down stairs without the crutch. He has been doing his hip exercises, squats, and generaly increasing his weight bearing and activity.    Pertinent History  L ankle surgery; hypertension; history of falling; L knee pain.     Limitations  Lifting;Standing;Walking;House hold activities    How long can you stand comfortably?  can do dishes    Diagnostic tests  CT scan L knee report 07/18/2019 "IMPRESSION: 1. 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 2. Partial healing of the impacted fracture of the proximal fibula. 3. Moderate knee joint effusion."    Currently in Pain?  No/denies         OBJECTIVE:  FUNCTIONAL OUTCOME MEASURE FOTO = 60(10/24/2019)  PROM LKnee  Flexion: L knee125aftermanual- last measured 10/31/2019  TREATMENT: 10/25/2019 MD visit:discharge brace. Wean from AD progressing gradually one week at a time each:  2 axial curtches, 1 axial crutch, SPC (for balance). Okay to use elliptical and leg press. Avoid knee extension machine.   Manual therapy:to reduce pain and tissue tension, improve range of motion, neuromodulation, in order to promote improved ability to complete functional activities. - supine L patellar mobs grade II-III medial and caudal x 5-10 - hooklying PA and AP joint mobilizations grade III-IV at left tibiofemoral joint and fibular head.  - hooklying PROM L knee flexion with OP to tolerance with gentle oscillations at end range, x 20  Therapeutic exercise:to centralize symptoms and improve ROM, strength, muscular endurance, and activity tolerance required for successful completion of functional activities.  - standing bent over hip extension/abduction diagonal, 2x20 each side with 3# AW applied second set. Originally tried in upright position with BUE but felt too much pressure on L LE to be comfortable during SL weight bearing.   total gym squat - bilateral legs level 17 (top of bar), x 20-30 - Left leg only 1x20, 1x30 at level 8, 1x30 at level 10 - Right leg only 3x12 at level 16  - Sit <> stand from low surface with UE support as needed and SBA for safety (to get on and off of the total gym).   Gait training: to improve gait  pattern for household and community ambulation. CGA for safety. Cuing for improved weight shift to the left and even gait pattern. Educated to watch for and minimize common compensations (peg leg and trunk lean).  - ambulated 20 feet x 7 focusing on improved weight shift and even steps.  - ascended and descended four 6 inch steps with unilateral UE support with left hand on rail using R lead to climb and L lead to descend with CGA for safety. Focusing on improving efficacy for climbing home stairs without crutch.   PT Education - 10/31/19 1338    Education Details  exercise purpose/form. self management techniques.    Person(s) Educated  Patient    Methods  Explanation;Demonstration;Tactile cues;Verbal cues  Comprehension  Verbalized understanding;Returned demonstration;Verbal cues required;Tactile cues required;Need further instruction       PT Short Term Goals - 08/14/19 1328      PT SHORT TERM GOAL #1   Title  Be independent with initial home exercise program for self-management of symptoms.    Baseline  initial HEP provided at IE (04/18/2019);    Time  2    Period  Weeks    Status  Achieved    Target Date  05/02/19        PT Long Term Goals - 10/24/19 1601      PT LONG TERM GOAL #1   Title  Be independent with a long-term home exercise program for self-management of symptoms.    Baseline  initial HEP provided at IE (04/18/2019); continues to participate in currently appropriate HEP (05/18/2019; 06/29/2019); updated HEP as appropriate for post-op rehab (08/14/2019); currently participating in appropriate HEP, has not yet received final long term HEP (08/24/2019; 09/26/2019; 10/24/2019);    Time  12    Period  Weeks    Status  Partially Met    Target Date  01/16/20      PT LONG TERM GOAL #2   Title  Demonstrate improved FOTO score by 10 units to demonstrate improvement in overall condition and self-reported functional ability.    Baseline  44 (04/18/2019); 52 (05/18/2019); 52  (06/29/2019); FOTO = 40 (08/14/2019); 51 (09/26/2019); 60 (10/24/2019);    Time  12    Period  Weeks    Status  Achieved    Target Date  01/16/20      PT LONG TERM GOAL #3   Title  Complete community, work and/or recreational activities without limitation due to current condition.    Baseline  Difficulites with walking, standing, shopping, driving, cooking and bathing, currently NWB L LE (04/18/2019); continues with similar difficulties but is now PWB25% and is able to drive, cross legs to don shoes (05/18/2019); improving but continues to have limitations and cannot perform activities that require single leg stance full weight bearing (06/29/2019); currently limited similarly to intial eval due to recent surgery 08/03/2019 to repair non-union fracture (08/14/2019); continues to have similar limitations (08/24/2019); is improving again but is not yet weight bearing that produces similar limitations, able to shower in 30 min independently now (09/26/2019); continues to improve but is still using an assistive device and WBAT (10/24/2019);    Time  12    Period  Weeks    Status  Partially Met    Target Date  01/16/20      PT LONG TERM GOAL #4   Title  Increase L knee flexion AROM to 140 degrees to be able to complete ADLs without limitation of knee flexion    Baseline  PROM 95 degrees of flexion with pain at end range and AROM 80 degrees(04/18/2019); AAROM 110 with end range pain (05/18/2019); PROM 0-126, AROM ext -25, AAROM flex 115 (06/29/2019); PROM lacking 10 degrees extension, up to 100 degrees flexion (with brace on) (08/14/2019); PROM extension lacking ~ 2 degrees, PROM flexion: 110 degrees (08/24/2019); PROM extension to 0 degrees, PROM flexion 122 degrees (09/26/2019); 0-116 AROM; PROM 0-125 (10/24/2019);    Time  12    Period  Weeks    Status  Partially Met    Target Date  01/16/20      PT LONG TERM GOAL #5   Title  Pt will increase LE strength to equal or greater than 4+/5 MMT in order to  demonstrate improvement in strength and function.    Baseline  See objectives on IE(04/18/2019); still 2/10 L knee extension (lacking last 22 degrees against gravity) (05/18/2019); continues to lack terminal knee extension in open change position, improved to at least 4/5 with discomfort within available range (06/29/2019); has decreased in since last session due to recent surgery and away from PT 2 weeks (08/14/2019); 25 degree extensor lag (08/24/2019); has 15 degree extensor lag with etim, MMT improving - see objective (09/26/2019); improving, extensor lag now 3 degrees in ASLR (10/24/2019);    Time  12    Period  Weeks    Status  Partially Met    Target Date  01/16/20            Plan - 10/31/19 1351    Clinical Impression Statement  Pt tolerated treatment well. Pt was able to complete all exercises with minimal to no lasting increase in pain or discomfort. Did have a pulling sensation at L proximal anterior tibia with deeper squats on total gym but resolved with rest. Patient not yet comfortable with sustained L single leg stance during hip abductions without partial weight bearing through arms, so modified exercise to match comfort level and phase of rehab. Patient is progressing well overall and demonstrates better than expected gait mechanics after being NWB for so long.  Pt required multimodal cuing for proper technique and to facilitate improved neuromuscular control, strength, range of motion, and functional ability resulting in improved performance and form. Patient would benefit from continued management of limiting condition by skilled physical therapist to address remaining impairments and functional limitations to work towards stated goals and return to PLOF or maximal functional independence.    Personal Factors and Comorbidities  Comorbidity 2;Age    Comorbidities  L ankle surgery; hypertension; history of falling; L knee pain.    Examination-Activity Limitations  Bathing;Bed  Mobility;Locomotion Level;Stairs;Stand;Hygiene/Grooming;Bend;Caring for Others;Sleep;Lift;Transfers;Toileting;Squat;Carry    Examination-Participation Restrictions  Yard Work;Driving;Meal Prep;Laundry;Shop;Cleaning    Stability/Clinical Decision Making  Evolving/Moderate complexity    Rehab Potential  Good    PT Frequency  2x / week    PT Duration  12 weeks    PT Treatment/Interventions  ADLs/Self Care Home Management;Cryotherapy;Moist Heat;Gait training;Stair training;Functional mobility training;Therapeutic activities;Therapeutic exercise;Balance training;Neuromuscular re-education;Manual techniques;Joint Manipulations;Passive range of motion;Dry needling;DME Instruction    PT Next Visit Plan  Strengthening, ROM and muscle endurance, gait training when appropriate    PT Home Exercise Plan  [employer]: [email or web address]    Consulted and Agree with Plan of Care  Patient       Patient will benefit from skilled therapeutic intervention in order to improve the following deficits and impairments:  Abnormal gait, Decreased endurance, Decreased activity tolerance, Decreased range of motion, Decreased strength, Pain, Impaired flexibility, Difficulty walking, Decreased balance, Impaired perceived functional ability, Decreased knowledge of use of DME, Decreased mobility  Visit Diagnosis: Acute pain of left knee  Unsteadiness on feet  Muscle weakness (generalized)     Problem List Patient Active Problem List   Diagnosis Date Noted  . Hypertension   . Hyperlipidemia   . Asthma   . Tibial plateau fracture, left, closed, with nonunion, subsequent encounter 08/03/2019    Everlean Alstrom. Graylon Good, PT, DPT 10/31/19, 1:52 PM  [facility] [address] [place], [address] Phone: [phone]   Fax:  [phone]  Name: Glenn Watson MRN: [id number] Date of Birth: 09-08-1945

## 2019-11-02 ENCOUNTER — Other Ambulatory Visit: Payer: Self-pay

## 2019-11-02 ENCOUNTER — Encounter: Payer: Self-pay | Admitting: Physical Therapy

## 2019-11-02 ENCOUNTER — Ambulatory Visit: Payer: PPO | Admitting: Physical Therapy

## 2019-11-02 DIAGNOSIS — R2681 Unsteadiness on feet: Secondary | ICD-10-CM

## 2019-11-02 DIAGNOSIS — M25562 Pain in left knee: Secondary | ICD-10-CM

## 2019-11-02 DIAGNOSIS — M6281 Muscle weakness (generalized): Secondary | ICD-10-CM

## 2019-11-02 NOTE — Therapy (Signed)
[facility] [address] [place], [address] Phone: [phone]   Fax:  [phone]  Physical Therapy Treatment  Patient Details  Name: Glenn Watson MRN: [id number] Date of Birth: 12/01/1945 Referring Provider (PT): Altamese Schoeneck, MD   Encounter Date: 11/02/2019  PT End of Session - 11/02/19 1707    Visit Number  49    Number of Visits  52    Date for PT Re-Evaluation  11/06/19    Authorization Type  [employer] reporting period from 09/28/2019    Authorization Time Period  N/A    Authorization - Visit Number  9    Authorization - Number of Visits  10    Progress Note Due on Visit  55   Next FOTO due 5/11   PT Start Time  1120    PT Stop Time  1200    PT Time Calculation (min)  40 min    Equipment Utilized During Treatment  Gait belt    Activity Tolerance  Patient tolerated treatment well;No increased pain    Behavior During Therapy  WFL for tasks assessed/performed       Past Medical History:  Diagnosis Date  . Anemia   . Asthma    as a child, exercise asthma - none since high school"  . Cancer ([place])    skin cancer - basal, squamous- Mylenoma - stage 2- chest  . Complication of anesthesia    "very sensitive to medications" "Had colonscopy with just Draminine"  . Family history of adverse reaction to anesthesia    daughter- N/V  . Hyperlipidemia   . Hypertension   . PONV (postoperative nausea and vomiting)     Past Surgical History:  Procedure Laterality Date  . APPENDECTOMY  2011   Dr. Bary Castilla  . COLONOSCOPY WITH PROPOFOL N/A 02/10/2017   Procedure: COLONOSCOPY WITH PROPOFOL;  Surgeon: Christene Lye, MD;  Location: [facility];  Service: Endoscopy;  Laterality: N/A;  . ORIF TIBIA PLATEAU Left 08/03/2019   Procedure: OPEN REDUCTION INTERNAL FIXATION (ORIF) TIBIAL PLATEAU;  Surgeon: Altamese Polkton, MD;  Location: [facility];  Service: Orthopedics;  Laterality: Left;     There were no vitals filed for this visit.  Subjective Assessment - 11/02/19 1138    Subjective  Patient reports he is feeling good this morning. No pain or discomfort following last treatment sesion. Was able to complete stairs at home with no crutch using single hand rail. Did not make it to they gym yesterday because he had handymen working at his home that require someone to be home.    Pertinent History  L ankle surgery; hypertension; history of falling; L knee pain.    Limitations  Lifting;Standing;Walking;House hold activities    How long can you stand comfortably?  can do dishes    Diagnostic tests  CT scan L knee report 07/18/2019 "IMPRESSION: 1. 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 2. Partial healing of the impacted fracture of the proximal fibula. 3. Moderate knee joint effusion."    Currently in Pain?  No/denies      OBJECTIVE:  FUNCTIONAL OUTCOME MEASURE FOTO =60(10/24/2019)  PROM LKnee  Flexion: L [id number] with pt using strap to pull while clinician measures,aftermanualand self OP- last measured 11/02/2019  TREATMENT: 10/25/2019 MD visit:discharge brace. Wean from AD progressing gradually one week at a time each:  2 axial curtches, 1 axial crutch, SPC (for balance). Okay to use elliptical and leg  press. Avoid knee extension machine.   Therapeutic exercise:to centralize symptoms and improve ROM, strength, muscular endurance, and activity tolerance required for successful completion of functional activities. - standing bent over hip extension/abduction diagonal, 2x20 each side with 3# AW.  - Leg press machine, seat all the way back, single leg. L leg  3x20 (20/25/25#), R leg 3x20 (35/45/55). Slight sensation over the L lower leg at 25# so stayed at that weight.  - hooklying L knee flexion AAROM with self overpressure using long gait belt, 5 second hold, x20 with instructions to complete at home.   - ambulated 20 feet x 4  focusing on improved weight shift and even steps.  - Hurdles (6 inches height, CGA for safety):   Forward step together leading R: x6  Forward step over step: 3x6  Side stepping: 2x6 each side  Backwards: step together 2x6 leading with each foot.   Manual therapy:to reduce pain and tissue tension, improve range of motion, neuromodulation, in order to promote improved ability to complete functional activities. - hooklying PA and AP joint mobilizations grade III-IV at left tibiofemoral joint and fibular head.  - hooklying PROM L kneeflexionwith OP to tolerance with gentle oscillations at end range   PT Education - 11/02/19 1707    Education Details  exercise purpose/form. self management techniques.    Person(s) Educated  Patient    Methods  Explanation;Demonstration;Tactile cues;Verbal cues    Comprehension  Verbalized understanding;Returned demonstration;Verbal cues required;Tactile cues required;Need further instruction       PT Short Term Goals - 08/14/19 1328      PT SHORT TERM GOAL #1   Title  Be independent with initial home exercise program for self-management of symptoms.    Baseline  initial HEP provided at IE (04/18/2019);    Time  2    Period  Weeks    Status  Achieved    Target Date  05/02/19        PT Long Term Goals - 10/24/19 1601      PT LONG TERM GOAL #1   Title  Be independent with a long-term home exercise program for self-management of symptoms.    Baseline  initial HEP provided at IE (04/18/2019); continues to participate in currently appropriate HEP (05/18/2019; 06/29/2019); updated HEP as appropriate for post-op rehab (08/14/2019); currently participating in appropriate HEP, has not yet received final long term HEP (08/24/2019; 09/26/2019; 10/24/2019);    Time  12    Period  Weeks    Status  Partially Met    Target Date  01/16/20      PT LONG TERM GOAL #2   Title  Demonstrate improved FOTO score by 10 units to demonstrate improvement in overall  condition and self-reported functional ability.    Baseline  44 (04/18/2019); 52 (05/18/2019); 52 (06/29/2019); FOTO = 40 (08/14/2019); 51 (09/26/2019); 60 (10/24/2019);    Time  12    Period  Weeks    Status  Achieved    Target Date  01/16/20      PT LONG TERM GOAL #3   Title  Complete community, work and/or recreational activities without limitation due to current condition.    Baseline  Difficulites with walking, standing, shopping, driving, cooking and bathing, currently NWB L LE (04/18/2019); continues with similar difficulties but is now PWB25% and is able to drive, cross legs to don shoes (05/18/2019); improving but continues to have limitations and cannot perform activities that require single leg stance full weight bearing (06/29/2019); currently  limited similarly to intial eval due to recent surgery 08/03/2019 to repair non-union fracture (08/14/2019); continues to have similar limitations (08/24/2019); is improving again but is not yet weight bearing that produces similar limitations, able to shower in 30 min independently now (09/26/2019); continues to improve but is still using an assistive device and WBAT (10/24/2019);    Time  12    Period  Weeks    Status  Partially Met    Target Date  01/16/20      PT LONG TERM GOAL #4   Title  Increase L knee flexion AROM to 140 degrees to be able to complete ADLs without limitation of knee flexion    Baseline  PROM 95 degrees of flexion with pain at end range and AROM 80 degrees(04/18/2019); AAROM 110 with end range pain (05/18/2019); PROM 0-126, AROM ext -25, AAROM flex 115 (06/29/2019); PROM lacking 10 degrees extension, up to 100 degrees flexion (with brace on) (08/14/2019); PROM extension lacking ~ 2 degrees, PROM flexion: 110 degrees (08/24/2019); PROM extension to 0 degrees, PROM flexion 122 degrees (09/26/2019); 0-116 AROM; PROM 0-125 (10/24/2019);    Time  12    Period  Weeks    Status  Partially Met    Target Date  01/16/20      PT LONG TERM  GOAL #5   Title  Pt will increase LE strength to equal or greater than 4+/5 MMT in order to demonstrate improvement in strength and function.    Baseline  See objectives on IE(04/18/2019); still 2/10 L knee extension (lacking last 22 degrees against gravity) (05/18/2019); continues to lack terminal knee extension in open change position, improved to at least 4/5 with discomfort within available range (06/29/2019); has decreased in since last session due to recent surgery and away from PT 2 weeks (08/14/2019); 25 degree extensor lag (08/24/2019); has 15 degree extensor lag with etim, MMT improving - see objective (09/26/2019); improving, extensor lag now 3 degrees in ASLR (10/24/2019);    Time  12    Period  Weeks    Status  Partially Met    Target Date  01/16/20            Plan - 11/02/19 1731    Clinical Impression Statement  Patient tolerated treatment well and continues to progress well towards improved gait patterns and functional balance. Continues to use B axial crutches to get in and out of clinic but is able to ambulate without AD and with close supervision in the clinic. Ambulates with antalgic gait favoring L LE when not focused on even gait pattern. Patient would benefit from continued management of limiting condition by skilled physical therapist to address remaining impairments and functional limitations to work towards stated goals and return to PLOF or maximal functional independence.    Personal Factors and Comorbidities  Comorbidity 2;Age    Comorbidities  L ankle surgery; hypertension; history of falling; L knee pain.    Examination-Activity Limitations  Bathing;Bed Mobility;Locomotion Level;Stairs;Stand;Hygiene/Grooming;Bend;Caring for Others;Sleep;Lift;Transfers;Toileting;Squat;Carry    Examination-Participation Restrictions  Yard Work;Driving;Meal Prep;Laundry;Shop;Cleaning    Stability/Clinical Decision Making  Evolving/Moderate complexity    Rehab Potential  Good    PT  Frequency  2x / week    PT Duration  12 weeks    PT Treatment/Interventions  ADLs/Self Care Home Management;Cryotherapy;Moist Heat;Gait training;Stair training;Functional mobility training;Therapeutic activities;Therapeutic exercise;Balance training;Neuromuscular re-education;Manual techniques;Joint Manipulations;Passive range of motion;Dry needling;DME Instruction    PT Next Visit Plan  Strengthening, ROM and muscle endurance, gait training when appropriate  PT Home Exercise Plan  [employer]: [id number]    Consulted and Agree with Plan of Care  Patient       Patient will benefit from skilled therapeutic intervention in order to improve the following deficits and impairments:  Abnormal gait, Decreased endurance, Decreased activity tolerance, Decreased range of motion, Decreased strength, Pain, Impaired flexibility, Difficulty walking, Decreased balance, Impaired perceived functional ability, Decreased knowledge of use of DME, Decreased mobility  Visit Diagnosis: Acute pain of left knee  Unsteadiness on feet  Muscle weakness (generalized)     Problem List Patient Active Problem List   Diagnosis Date Noted  . Hypertension   . Hyperlipidemia   . Asthma   . Tibial plateau fracture, left, closed, with nonunion, subsequent encounter 08/03/2019    Everlean Alstrom. Graylon Good, PT, DPT 11/02/19, 5:33 PM  [facility] [address] [place], [address] Phone: [phone]   Fax:  [phone]  Name: Glenn Watson MRN: [id number] Date of Birth: [birth year]

## 2019-11-03 DIAGNOSIS — Z85820 Personal history of malignant melanoma of skin: Secondary | ICD-10-CM | POA: Diagnosis not present

## 2019-11-03 DIAGNOSIS — L820 Inflamed seborrheic keratosis: Secondary | ICD-10-CM | POA: Diagnosis not present

## 2019-11-03 DIAGNOSIS — L538 Other specified erythematous conditions: Secondary | ICD-10-CM | POA: Diagnosis not present

## 2019-11-03 DIAGNOSIS — Z85828 Personal history of other malignant neoplasm of skin: Secondary | ICD-10-CM | POA: Diagnosis not present

## 2019-11-03 DIAGNOSIS — D2271 Melanocytic nevi of right lower limb, including hip: Secondary | ICD-10-CM | POA: Diagnosis not present

## 2019-11-03 DIAGNOSIS — D2261 Melanocytic nevi of right upper limb, including shoulder: Secondary | ICD-10-CM | POA: Diagnosis not present

## 2019-11-03 DIAGNOSIS — D225 Melanocytic nevi of trunk: Secondary | ICD-10-CM | POA: Diagnosis not present

## 2019-11-03 DIAGNOSIS — L821 Other seborrheic keratosis: Secondary | ICD-10-CM | POA: Diagnosis not present

## 2019-11-07 ENCOUNTER — Ambulatory Visit: Payer: PPO | Admitting: Physical Therapy

## 2019-11-07 ENCOUNTER — Other Ambulatory Visit: Payer: Self-pay

## 2019-11-07 DIAGNOSIS — M25562 Pain in left knee: Secondary | ICD-10-CM | POA: Diagnosis not present

## 2019-11-07 DIAGNOSIS — R2681 Unsteadiness on feet: Secondary | ICD-10-CM

## 2019-11-07 DIAGNOSIS — M6281 Muscle weakness (generalized): Secondary | ICD-10-CM

## 2019-11-07 NOTE — Therapy (Signed)
[facility] [address], [place], [address] Phone: [phone]   Fax:  [phone]  Physical Therapy Treatment / Progress Note / Re-Certification Reporting Period: 10/03/2019 - 10/08/2019  Patient Details  Name: Glenn Watson MRN: [id number] Date of Birth: 07/15/1945 Referring Provider (PT): Altamese [place], MD   Encounter Date: 11/07/2019  PT End of Session - 11/08/19 1353    Visit Number  50    Number of Visits  102    Date for PT Re-Evaluation  01/30/20    Authorization Type  [employer] reporting period from 10/03/2019    Authorization Time Period  N/A    Authorization - Visit Number  10    Authorization - Number of Visits  10    Progress Note Due on Visit  60   Next FOTO due 5/25   PT Start Time  1120    PT Stop Time  1200    PT Time Calculation (min)  40 min    Equipment Utilized During Treatment  Gait belt    Activity Tolerance  Patient tolerated treatment well;No increased pain    Behavior During Therapy  WFL for tasks assessed/performed       Past Medical History:  Diagnosis Date  . Anemia   . Asthma    as a child, exercise asthma - none since high school"  . Cancer ([place])    skin cancer - basal, squamous- Mylenoma - stage 2- chest  . Complication of anesthesia    "very sensitive to medications" "Had colonscopy with just Draminine"  . Family history of adverse reaction to anesthesia    daughter- N/V  . Hyperlipidemia   . Hypertension   . PONV (postoperative nausea and vomiting)     Past Surgical History:  Procedure Laterality Date  . APPENDECTOMY  2011   Dr. Bary Castilla  . COLONOSCOPY WITH PROPOFOL N/A 02/10/2017   Procedure: COLONOSCOPY WITH PROPOFOL;  Surgeon: Christene Lye, MD;  Location: [facility] END[facility]COPY;  Service: Endoscopy;  Laterality: N/A;  . ORIF TIBIA PLATEAU Left 08/03/2019   Procedure: OPEN REDUCTION INTERNAL FIXATION (ORIF) TIBIAL PLATEAU;  Surgeon: Altamese An[place], MD;  Location: [facility];  Service: Orthopedics;  Laterality: Left;    There were no vitals filed for this visit.  Subjective Assessment - 11/08/19 1342    Subjective  Patient reports he is feeling well today. Continues to notice functional improvements and is ambulating outside the home with one axial crutch and planning to go to a [facility] in the next couple of days per MD reccomendation. Is already ambulating around the home without an AD most of the time. Has been to the gym twice and used elliptical for at least 15 min each with no problem. No excessive pain or soreness following last treatment session. Had a little pain after forgetting to come down the stairs leading with L LE but it went away quickly. Continues to need to modify activites due while gradually returning to FWB and increasing activity level and is not yet returned to PLOF.    Pertinent History  L ankle surgery; hypertension; history of falling; L knee pain.    Limitations  Lifting;Standing;Walking;House hold activities   bending/extending L knee, lifiting L LE, including ADLs, IADLs, household and community mobility, social activities, hobbies, running, stairs, hiking, etc   How long can you stand comfortably?  can do dishes    Diagnostic tests  CT scan L knee report 07/18/2019 "IMPRESSION: 1.  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 2. Partial healing of the impacted fracture of the proximal fibula. 3. Moderate knee joint effusion."    Currently in Pain?  No/denies    Effect of Pain on Daily Activities  .        [facility] PT Assessment - 11/08/19 0001      Assessment   Medical Diagnosis  Lt Tibial Plateau nonunion, s/p repair    ORIF completed 07/03/2019   Referring Provider (PT)  Altamese [place], MD    Onset Date/Surgical Date  03/27/19   fall 03/27/2019; sx 08/03/2019   Hand Dominance  Right      Precautions   Precautions  Other (comment)    Precaution Comments  avoid hip extension machine,  no longer need brace    Required Braces or Orthoses  --    Other Brace/Splint  --      Restrictions   Weight Bearing Restrictions  Yes    LLE Weight Bearing  Weight bearing as tolerated   see comment below   Other Position/Activity Restrictions  10/25/2019 MD visit:discharge brace. Wean fromAD progressing graduallyone week at a time each:2 axial curtches, 1 axial crutch, SPC (for balance). Okay to useelliptical and leg press. Avoidknee extension machine.       [place]  Private residence    Living Arrangements  Spouse/significant other    Available Help at Discharge  Family    Type of [place] to enter;Level entry    [place]  Two level    Alternate Level Stairs-Number of Steps  --   [place] - 2 wheels;Crutches      Prior Function   Level of [place]  Retired    Leisure  [employer], [place], reading      Cognition   Overall Cognitive Status  Within Functional Limits for tasks assessed      Observation/Other Assessments   Observations  see note from 11/08/2019 for latest objective data    Focus on Therapeutic Outcomes (FOTO)   FOTO = 63 (11/07/2019)       OBJECTIVE:  [place] =63(11/07/2019)   PROM Knee  Flexion: L [id number]  Extension:0 degrees *indicates pain  AROM Knee  Extension: Active straight leg raise, lacking 3 degrees *indicates pain   MUSCLE PERFORMANCE (MMT) R/L 5/4+Hip flexion 4/4-Hip abduction  4+/4 Hip adduction 5/4+Hip extension (knee extended), tightness noted 5/4+Knee extension (submax force applied by clinician on L) 5/4+Knee flexion (submax force applied by clinician on L).  5/4+Ankle Dorsiflexion 5/4+Ankle Plantarflexion *indicates pain Ankle eversion and great toe extension WFL bilaterally Able to heel walk and toe walk with  good ankle strength B  FUNCTIONAL TESTS:  Squat to parallel with BUE support while wearing unlocked hinge brace. Moderate to slight shift towards right that is improved with cuing.  Standing left step up to 4 inch step with touchdown support L UE. Able to step up to 6 inch step with L UE support with difficulty    TREATMENT: 10/25/2019 MD visit:discharge brace. Wean fromAD progressing graduallyone week at a time each:2 axial curtches, 1 axial crutch, SPC (for balance). Okay to useelliptical and leg press. Avoidknee extension machine.   Therapeutic exercise:to centralize symptoms and improve ROM, strength, muscular endurance, and activity tolerance required for successful completion of functional  activities.  - ambulated 20 feet x 6 using SPC focusing on improved weight shift and even steps.   total gym squat - bilateral legs level 26 (top of bar), x 20 - Left leg only 1x20 level 13, 2x20 level 16 (mild discomfort at anterior knee first set, not second set).  - Right leg only 1x20 at level 20, 1x18 at level 22, 1x16 at 24 - step up to 6 inch step with L LE (down with R) x 3 with unilateral UE support. Uncomfortable and dififculty requiring UE support. Lowered to 4 inch step x 20 with touchdown UE support with improved tolerance. X 3 step over front of step with R LE.  - MMT to assess progress (See above).   Manual therapy:to reduce pain and tissue tension, improve range of motion, neuromodulation, in order to promote improved ability to complete functional activities. -hooklyingPROM L kneeflexionwith OP to tolerancewith gentle oscillations at end range x 20   PT Education - 11/08/19 1353    Education Details  exercise purpose/form. self management techniques. POC. progress    Person(s) Educated  Patient    Methods  Explanation;Demonstration;Tactile cues;Verbal cues    Comprehension  Verbalized understanding;Returned demonstration;Verbal cues required;Tactile cues  required;Need further instruction       PT Short Term Goals - 08/14/19 1328      PT SHORT TERM GOAL #1   Title  Be independent with initial home exercise program for self-management of symptoms.    Baseline  initial HEP provided at IE (04/18/2019);    Time  2    Period  Weeks    Status  Achieved    Target Date  05/02/19        PT Long Term Goals - 11/08/19 1354      PT LONG TERM GOAL #1   Title  Be independent with a long-term home exercise program for self-management of symptoms.    Baseline  initial HEP provided at IE (04/18/2019); continues to participate in currently appropriate HEP (05/18/2019; 06/29/2019); updated HEP as appropriate for post-op rehab (08/14/2019); currently participating in appropriate HEP, has not yet received final long term HEP (08/24/2019; 09/26/2019; 10/24/2019; 11/07/2019);    Time  12    Period  Weeks    Status  Partially Met    Target Date  01/30/20      PT LONG TERM GOAL #2   Title  Demonstrate improved FOTO score by 10 units to demonstrate improvement in overall condition and self-reported functional ability.    Baseline  44 (04/18/2019); 52 (05/18/2019); 52 (06/29/2019); FOTO = 40 (08/14/2019); 51 (09/26/2019); 60 (10/24/2019); 63 (11/07/2019);    Time  12    Period  Weeks    Status  Achieved    Target Date  01/30/20      PT LONG TERM GOAL #3   Title  Complete community, work and/or recreational activities without limitation due to current condition.    Baseline  Difficulites with walking, standing, shopping, driving, cooking and bathing, currently NWB L LE (04/18/2019); continues with similar difficulties but is now PWB25% and is able to drive, cross legs to don shoes (05/18/2019); improving but continues to have limitations and cannot perform activities that require single leg stance full weight bearing (06/29/2019); currently limited similarly to intial eval due to recent surgery 08/03/2019 to repair non-union fracture (08/14/2019); continues to have  similar limitations (08/24/2019); is improving again but is not yet weight bearing that produces similar limitations, able to shower in 30 min  independently now (09/26/2019); continues to improve but is still using an assistive device and WBAT (10/24/2019); continues to improve and is currently weaning from AD, improved abilty to ambulate, cook, complete ADLs, etc (11/07/2019);    Time  12    Period  Weeks    Status  Partially Met    Target Date  01/30/20      PT LONG TERM GOAL #4   Title  Increase L knee flexion AROM to 140 degrees to be able to complete ADLs without limitation of knee flexion    Baseline  PROM 95 degrees of flexion with pain at end range and AROM 80 degrees(04/18/2019); AAROM 110 with end range pain (05/18/2019); PROM 0-126, AROM ext -25, AAROM flex 115 (06/29/2019); PROM lacking 10 degrees extension, up to 100 degrees flexion (with brace on) (08/14/2019); PROM extension lacking ~ 2 degrees, PROM flexion: 110 degrees (08/24/2019); PROM extension to 0 degrees, PROM flexion 122 degrees (09/26/2019); 0-116 AROM; PROM 0-125 (10/24/2019); 0-132 (11/07/2019);    Time  12    Period  Weeks    Status  Partially Met    Target Date  01/30/20      PT LONG TERM GOAL #5   Title  Pt will increase LE strength to equal or greater than 4+/5 MMT in order to demonstrate improvement in strength and function.    Baseline  See objectives on IE(04/18/2019); still 2/10 L knee extension (lacking last 22 degrees against gravity) (05/18/2019); continues to lack terminal knee extension in open change position, improved to at least 4/5 with discomfort within available range (06/29/2019); has decreased in since last session due to recent surgery and away from PT 2 weeks (08/14/2019); 25 degree extensor lag (08/24/2019); has 15 degree extensor lag with etim, MMT improving - see objective (09/26/2019); improving, extensor lag now 3 degrees in ASLR (10/24/2019); continues to improve - see objective (11/07/2019);    Time  12     Period  Weeks    Status  Partially Met    Target Date  01/30/20            Plan - 11/08/19 1401    Clinical Impression Statement  Patient has attended 50 physical therapy sessions this episode of care and continues to make steady progress towards goals since ORIF to L tibial plateau 08/03/2019. Patient has successfully transitioned to weight bearing as tolerated and is currently able to ambulate short distances without an assistive device, although he is following MD's instructions to wean from assistive devices over 3-4 weeks. Patient's left quad activation, and left knee AROM has improved dramatically and he now has only a slight (3 degree) extensor lag during active straight leg raise and consistently shows full L knee extension when coming into the clinic. He has started using elliptical machine for exercise up to at least 15 min at a time successfully. His strength against resistance and his standing tolerance and ambulation distance has improved. 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 He has improved L knee PROM to 0-132 and is able to complete an active SLR with 3 degree lag. Patient continues to demonstrate slight laxity in proximal L tibia to manual pressure Patient has not yet returned to PLOF and his recovery was initially delayed by non-union of his original injury prior to surgery. Patient continues to demonstrate significant impairments such as deficits in muscle performance (strength/power/endurance), ROM, joint stiffness, tissue integrity, mobility,  pain, weight bearing restrictions. These deficits limit the patient ability to perform things such as ADLs, IADLs, social participation, caring for others, engaging in hobbies (house working, cooking, and driving), and impairs his quality of life. Patient would benefit from continued management of limiting condition by skilled physical therapist to address  remaining impairments and functional limitations to work towards stated goals and return to PLOF or maximal functional independence    Personal Factors and Comorbidities  Comorbidity 2;Age    Comorbidities  L ankle surgery; hypertension; history of falling; L knee pain.    Examination-Activity Limitations  Bathing;Bed Mobility;Locomotion Level;Stairs;Stand;Hygiene/Grooming;Bend;Caring for Others;Sleep;Lift;Transfers;Toileting;Squat;Carry    Examination-Participation Restrictions  Yard Work;Driving;Meal Prep;Laundry;Shop;Cleaning    Stability/Clinical Decision Making  Evolving/Moderate complexity    Rehab Potential  Good    PT Frequency  2x / week    PT Duration  12 weeks    PT Treatment/Interventions  ADLs/Self Care Home Management;Cryotherapy;Moist Heat;Gait training;Stair training;Functional mobility training;Therapeutic activities;Therapeutic exercise;Balance training;Neuromuscular re-education;Manual techniques;Joint Manipulations;Passive range of motion;Dry needling;DME Instruction    PT Next Visit Plan  Strengthening, ROM and muscle endurance, gait training when appropriate    PT Home Exercise Plan  [employer]: [email or web address]    Consulted and Agree with Plan of Care  Patient       Patient will benefit from skilled therapeutic intervention in order to improve the following deficits and impairments:  Abnormal gait, Decreased endurance, Decreased activity tolerance, Decreased range of motion, Decreased strength, Pain, Impaired flexibility, Difficulty walking, Decreased balance, Impaired perceived functional ability, Decreased knowledge of use of DME, Decreased mobility  Visit Diagnosis: Acute pain of left knee  Unsteadiness on feet  Muscle weakness (generalized)     Problem List Patient Active Problem List   Diagnosis Date Noted  . Hypertension   . Hyperlipidemia   . Asthma   . Tibial plateau fracture, left, closed, with nonunion, subsequent encounter 08/03/2019    Everlean Alstrom. Graylon Good,  PT, DPT 11/08/19, 2:02 PM  [facility] [address], [place], [address] Phone: [phone]   Fax:  [phone]  Name: Glenn Watson MRN: [id number] Date of Birth: 09-13-1945

## 2019-11-08 ENCOUNTER — Encounter: Payer: Self-pay | Admitting: Physical Therapy

## 2019-11-09 ENCOUNTER — Encounter: Payer: Self-pay | Admitting: Physical Therapy

## 2019-11-09 ENCOUNTER — Ambulatory Visit: Payer: PPO | Admitting: Physical Therapy

## 2019-11-09 ENCOUNTER — Other Ambulatory Visit: Payer: Self-pay

## 2019-11-09 DIAGNOSIS — M25562 Pain in left knee: Secondary | ICD-10-CM

## 2019-11-09 DIAGNOSIS — R2681 Unsteadiness on feet: Secondary | ICD-10-CM

## 2019-11-09 DIAGNOSIS — M6281 Muscle weakness (generalized): Secondary | ICD-10-CM

## 2019-11-09 NOTE — Therapy (Signed)
[facility] [address] [place], [address] Phone: [phone]   Fax:  [phone]  Physical Therapy Treatment  Patient Details  Name: Glenn Watson MRN: [id number] Date of Birth: [birth year] Referring Provider (PT): Altamese Peak Place, MD   Encounter Date: 11/09/2019  PT End of Session - 11/09/19 1929    Visit Number  51    Number of Visits  77    Date for PT Re-Evaluation  01/30/20    Authorization Type  [employer] reporting period from 11/09/2019    Authorization Time Period  N/A    Authorization - Visit Number  1    Authorization - Number of Visits  10    Progress Note Due on Visit  60   Next FOTO due 5/25   PT Start Time  1120    PT Stop Time  1200    PT Time Calculation (min)  40 min    Equipment Utilized During Treatment  Gait belt    Activity Tolerance  Patient tolerated treatment well;No increased pain    Behavior During Therapy  WFL for tasks assessed/performed       Past Medical History:  Diagnosis Date  . Anemia   . Asthma    as a child, exercise asthma - none since high school"  . Cancer ([place])    skin cancer - basal, squamous- Mylenoma - stage 2- chest  . Complication of anesthesia    "very sensitive to medications" "Had colonscopy with just Draminine"  . Family history of adverse reaction to anesthesia    daughter- N/V  . Hyperlipidemia   . Hypertension   . PONV (postoperative nausea and vomiting)     Past Surgical History:  Procedure Laterality Date  . APPENDECTOMY  2011   Dr. Bary Castilla  . COLONOSCOPY WITH PROPOFOL N/A 02/10/2017   Procedure: COLONOSCOPY WITH PROPOFOL;  Surgeon: Christene Lye, MD;  Location: [facility];  Service: Endoscopy;  Laterality: N/A;  . ORIF TIBIA PLATEAU Left 08/03/2019   Procedure: OPEN REDUCTION INTERNAL FIXATION (ORIF) TIBIAL PLATEAU;  Surgeon: Altamese Onslow, MD;  Location: [facility];  Service: Orthopedics;  Laterality: Left;     There were no vitals filed for this visit.  Subjective Assessment - 11/09/19 1117    Subjective  Patient reports no pain upon arrival. went to the gym yesterday and did elliptical 20 min on level 6 with no problems.    Pertinent History  L ankle surgery; hypertension; history of falling; L knee pain.    Limitations  Lifting;Standing;Walking;House hold activities   bending/extending L knee, lifiting L LE, including ADLs, IADLs, household and community mobility, social activities, hobbies, running, stairs, hiking, etc   How long can you stand comfortably?  can do dishes    Diagnostic tests  CT scan L knee report 07/18/2019 "IMPRESSION: 1. 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 2. Partial healing of the impacted fracture of the proximal fibula. 3. Moderate knee joint effusion."    Currently in Pain?  No/denies         TREATMENT: 10/25/2019 MD visit:discharge brace. Wean fromAD progressing graduallyone week at a time each:2 axial curtches, 1 axial crutch, SPC (for balance). Okay to useelliptical and leg press. Avoidknee extension machine.   Neuromuscular Re-education: to improve, balance, postural strength, muscle activation patterns, and stabilization strength required for functional activities: 6 inch Hurdle exercises (CGA for safety):  - forward with reciprocal gait through 6 hurdles x  6.  - side stepping 6 hurdles x 3 each direction - backwards with step-to gait through 6 hurdles x 3 leading with each foot.   Therapeutic exercise:to centralize symptoms and improve ROM, strength, muscular endurance, and activity tolerance required for successful completion of functional activities. - Side stepping with black theraband wrapped tightly around distal thighs to activate core and hip stabilizers and abductors in functional weightbearing position. Stepping a few steps out and in as allowed by band 2x 2 min alternating sides avery 20-30 feet. CGA for  safety) - forward mini lunge with unilateral UE support, 2x10 each side.  - standing lateral toe slides with furniture slider with BUE support, 2x10 each side with slight knee flexion, focusing on balance and stability of support LE to improve SLS tolerance and balance.  - sit <> stand transfer from total gym (low seat) with BUE support and supervision.   total gym squat - bilateral legs level 26 (top of bar), x 20 - Left leg only 1x20 level 16, 1x20 at level 18, 1x20 at level 20 - Right leg only 1x16 at 24, 2x16 at 26   PT Education - 11/09/19 1929    Education Details  exercise purpose/form. self management techniques    Person(s) Educated  Patient    Methods  Explanation;Demonstration;Tactile cues;Verbal cues    Comprehension  Verbalized understanding;Returned demonstration;Verbal cues required;Tactile cues required;Need further instruction       PT Short Term Goals - 08/14/19 1328      PT SHORT TERM GOAL #1   Title  Be independent with initial home exercise program for self-management of symptoms.    Baseline  initial HEP provided at IE (04/18/2019);    Time  2    Period  Weeks    Status  Achieved    Target Date  05/02/19        PT Long Term Goals - 11/08/19 1354      PT LONG TERM GOAL #1   Title  Be independent with a long-term home exercise program for self-management of symptoms.    Baseline  initial HEP provided at IE (04/18/2019); continues to participate in currently appropriate HEP (05/18/2019; 06/29/2019); updated HEP as appropriate for post-op rehab (08/14/2019); currently participating in appropriate HEP, has not yet received final long term HEP (08/24/2019; 09/26/2019; 10/24/2019; 11/07/2019);    Time  12    Period  Weeks    Status  Partially Met    Target Date  01/30/20      PT LONG TERM GOAL #2   Title  Demonstrate improved FOTO score by 10 units to demonstrate improvement in overall condition and self-reported functional ability.    Baseline  44 (04/18/2019);  52 (05/18/2019); 52 (06/29/2019); FOTO = 40 (08/14/2019); 51 (09/26/2019); 60 (10/24/2019); 63 (11/07/2019);    Time  12    Period  Weeks    Status  Achieved    Target Date  01/30/20      PT LONG TERM GOAL #3   Title  Complete community, work and/or recreational activities without limitation due to current condition.    Baseline  Difficulites with walking, standing, shopping, driving, cooking and bathing, currently NWB L LE (04/18/2019); continues with similar difficulties but is now PWB25% and is able to drive, cross legs to don shoes (05/18/2019); improving but continues to have limitations and cannot perform activities that require single leg stance full weight bearing (06/29/2019); currently limited similarly to intial eval due to recent surgery 08/03/2019 to repair non-union  fracture (08/14/2019); continues to have similar limitations (08/24/2019); is improving again but is not yet weight bearing that produces similar limitations, able to shower in 30 min independently now (09/26/2019); continues to improve but is still using an assistive device and WBAT (10/24/2019); continues to improve and is currently weaning from AD, improved abilty to ambulate, cook, complete ADLs, etc (11/07/2019);    Time  12    Period  Weeks    Status  Partially Met    Target Date  01/30/20      PT LONG TERM GOAL #4   Title  Increase L knee flexion AROM to 140 degrees to be able to complete ADLs without limitation of knee flexion    Baseline  PROM 95 degrees of flexion with pain at end range and AROM 80 degrees(04/18/2019); AAROM 110 with end range pain (05/18/2019); PROM 0-126, AROM ext -25, AAROM flex 115 (06/29/2019); PROM lacking 10 degrees extension, up to 100 degrees flexion (with brace on) (08/14/2019); PROM extension lacking ~ 2 degrees, PROM flexion: 110 degrees (08/24/2019); PROM extension to 0 degrees, PROM flexion 122 degrees (09/26/2019); 0-116 AROM; PROM 0-125 (10/24/2019); 0-132 (11/07/2019);    Time  12    Period   Weeks    Status  Partially Met    Target Date  01/30/20      PT LONG TERM GOAL #5   Title  Pt will increase LE strength to equal or greater than 4+/5 MMT in order to demonstrate improvement in strength and function.    Baseline  See objectives on IE(04/18/2019); still 2/10 L knee extension (lacking last 22 degrees against gravity) (05/18/2019); continues to lack terminal knee extension in open change position, improved to at least 4/5 with discomfort within available range (06/29/2019); has decreased in since last session due to recent surgery and away from PT 2 weeks (08/14/2019); 25 degree extensor lag (08/24/2019); has 15 degree extensor lag with etim, MMT improving - see objective (09/26/2019); improving, extensor lag now 3 degrees in ASLR (10/24/2019); continues to improve - see objective (11/07/2019);    Time  12    Period  Weeks    Status  Partially Met    Target Date  01/30/20            Plan - 11/09/19 1936    Clinical Impression Statement  Patient tolerated treatment well and was able to complete all exercises without pain or discomfort. Progressed to more unilateral or asymmetrical loading today. Continues to demonstrates deficits in L LE strength and balance but gradually improving. Patient would benefit from continued management of limiting condition by skilled physical therapist to address remaining impairments and functional limitations to work towards stated goals and return to PLOF or maximal functional independence.    Personal Factors and Comorbidities  Comorbidity 2;Age    Comorbidities  L ankle surgery; hypertension; history of falling; L knee pain.    Examination-Activity Limitations  Bathing;Bed Mobility;Locomotion Level;Stairs;Stand;Hygiene/Grooming;Bend;Caring for Others;Sleep;Lift;Transfers;Toileting;Squat;Carry    Examination-Participation Restrictions  Yard Work;Driving;Meal Prep;Laundry;Shop;Cleaning    Stability/Clinical Decision Making  Evolving/Moderate complexity     Rehab Potential  Good    PT Frequency  2x / week    PT Duration  12 weeks    PT Treatment/Interventions  ADLs/Self Care Home Management;Cryotherapy;Moist Heat;Gait training;Stair training;Functional mobility training;Therapeutic activities;Therapeutic exercise;Balance training;Neuromuscular re-education;Manual techniques;Joint Manipulations;Passive range of motion;Dry needling;DME Instruction    PT Next Visit Plan  Strengthening, ROM and muscle endurance, gait training when appropriate    PT [place]:  [id number]    Consulted and Agree with Plan of Care  Patient       Patient will benefit from skilled therapeutic intervention in order to improve the following deficits and impairments:  Abnormal gait, Decreased endurance, Decreased activity tolerance, Decreased range of motion, Decreased strength, Pain, Impaired flexibility, Difficulty walking, Decreased balance, Impaired perceived functional ability, Decreased knowledge of use of DME, Decreased mobility  Visit Diagnosis: Acute pain of left knee  Unsteadiness on feet  Muscle weakness (generalized)     Problem List Patient Active Problem List   Diagnosis Date Noted  . Hypertension   . Hyperlipidemia   . Asthma   . Tibial plateau fracture, left, closed, with nonunion, subsequent encounter 08/03/2019    Everlean Alstrom. Graylon Good, PT, DPT 11/09/19, 7:37 PM  [facility] [address] [place], [address] Phone: [phone]   Fax:  [phone]  Name: Glenn Watson MRN: [id number] Date of Birth: 03/03/1946

## 2019-11-14 ENCOUNTER — Encounter: Payer: Self-pay | Admitting: Physical Therapy

## 2019-11-14 ENCOUNTER — Ambulatory Visit: Payer: PPO | Admitting: Physical Therapy

## 2019-11-14 ENCOUNTER — Other Ambulatory Visit: Payer: Self-pay

## 2019-11-14 DIAGNOSIS — R2681 Unsteadiness on feet: Secondary | ICD-10-CM

## 2019-11-14 DIAGNOSIS — M6281 Muscle weakness (generalized): Secondary | ICD-10-CM

## 2019-11-14 DIAGNOSIS — M25562 Pain in left knee: Secondary | ICD-10-CM

## 2019-11-14 NOTE — Therapy (Signed)
[facility] [address] [place], [address] Phone: [phone]   Fax:  [phone]  Physical Therapy Treatment  Patient Details  Name: Glenn Watson MRN: [id number] Date of Birth: 03/23/1946 Referring Provider (PT): Altamese Funk, MD   Encounter Date: 11/14/2019  PT End of Session - 11/14/19 1215    Visit Number  52    Number of Visits  53    Date for PT Re-Evaluation  01/30/20    Authorization Type  [employer] reporting period from 11/09/2019    Authorization Time Period  N/A    Authorization - Visit Number  2    Authorization - Number of Visits  10    Progress Note Due on Visit  60   Next FOTO due 5/25   PT Start Time  1120    PT Stop Time  1200    PT Time Calculation (min)  40 min    Equipment Utilized During Treatment  Gait belt    Activity Tolerance  Patient tolerated treatment well;No increased pain    Behavior During Therapy  WFL for tasks assessed/performed       Past Medical History:  Diagnosis Date  . Anemia   . Asthma    as a child, exercise asthma - none since high school"  . Cancer ([place])    skin cancer - basal, squamous- Mylenoma - stage 2- chest  . Complication of anesthesia    "very sensitive to medications" "Had colonscopy with just Draminine"  . Family history of adverse reaction to anesthesia    daughter- N/V  . Hyperlipidemia   . Hypertension   . PONV (postoperative nausea and vomiting)     Past Surgical History:  Procedure Laterality Date  . APPENDECTOMY  2011   Dr. Bary Castilla  . COLONOSCOPY WITH PROPOFOL N/A 02/10/2017   Procedure: COLONOSCOPY WITH PROPOFOL;  Surgeon: Christene Lye, MD;  Location: [facility];  Service: Endoscopy;  Laterality: N/A;  . ORIF TIBIA PLATEAU Left 08/03/2019   Procedure: OPEN REDUCTION INTERNAL FIXATION (ORIF) TIBIAL PLATEAU;  Surgeon: Altamese Champion, MD;  Location: [facility];  Service: Orthopedics;  Laterality: Left;     There were no vitals filed for this visit.  Subjective Assessment - 11/14/19 1122    Subjective  Patient reports he did the elliptical for 20 min yesterday, bike for 20 min for deep knee bends. was a bit sore yesterday. No pain upon arrival.    Pertinent History  L ankle surgery; hypertension; history of falling; L knee pain.    Limitations  Lifting;Standing;Walking;House hold activities   bending/extending L knee, lifiting L LE, including ADLs, IADLs, household and community mobility, social activities, hobbies, running, stairs, hiking, etc   How long can you stand comfortably?  can do dishes    Diagnostic tests  CT scan L knee report 07/18/2019 "IMPRESSION: 1. 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 2. Partial healing of the impacted fracture of the proximal fibula. 3. Moderate knee joint effusion."    Currently in Pain?  No/denies         TREATMENT: 10/25/2019 MD visit:discharge brace. Wean fromAD progressing graduallyone week at a time each:2 axial curtches, 1 axial crutch, SPC (for balance). Okay to useelliptical and leg press. Avoidknee extension machine.   Therapeutic exercise:to centralize symptoms and improve ROM, strength, muscular endurance, and activity tolerance required for successful completion of functional activities. - standing lateral toe slides with furniture slider with  BUE support, 3x10 each side with slight knee flexion, focusing on balance and stability of support LE to improve SLS tolerance and balance.  - Side stepping with black theraband wrapped tightly around distal thighs to activate core and hip stabilizers and abductors in functional weightbearing position. Stepping a few steps out and in as allowed by band 2x 2 min alternating sides avery 20-30 feet. CGA for safety) - single leg stance balance practice 2x30 seconds on L LE, one x 16 seconds on R LE. - forward mini lunge with unilateral touchdown UE support (VERY  intermittent), 2x10 each side.  - back lung at TRX x10 each side (struggles with form, CGA for safety) At bar x 10 each side (much better form) - step up to 4 inch step x10 each side no UE suppport (available if needed) - step up to 6 inch step 2x10 each side no UE support (available if needed) - ascending and descending four 6-inch stairs step over step with BUE support, slow at first progressing to normal pace. x6-7 times.   Pt required multimodal cuing for proper technique and to facilitate improved neuromuscular control, strength, range of motion, and functional ability resulting in improved performance and form. Supervision - CGA provided for safety.    PT Education - 11/14/19 1215    Education Details  exercise purpose/form. self management techniques    Person(s) Educated  Patient    Methods  Explanation;Demonstration;Tactile cues;Verbal cues    Comprehension  Verbalized understanding;Returned demonstration;Verbal cues required;Tactile cues required;Need further instruction       PT Short Term Goals - 08/14/19 1328      PT SHORT TERM GOAL #1   Title  Be independent with initial home exercise program for self-management of symptoms.    Baseline  initial HEP provided at IE (04/18/2019);    Time  2    Period  Weeks    Status  Achieved    Target Date  05/02/19        PT Long Term Goals - 11/08/19 1354      PT LONG TERM GOAL #1   Title  Be independent with a long-term home exercise program for self-management of symptoms.    Baseline  initial HEP provided at IE (04/18/2019); continues to participate in currently appropriate HEP (05/18/2019; 06/29/2019); updated HEP as appropriate for post-op rehab (08/14/2019); currently participating in appropriate HEP, has not yet received final long term HEP (08/24/2019; 09/26/2019; 10/24/2019; 11/07/2019);    Time  12    Period  Weeks    Status  Partially Met    Target Date  01/30/20      PT LONG TERM GOAL #2   Title  Demonstrate improved  FOTO score by 10 units to demonstrate improvement in overall condition and self-reported functional ability.    Baseline  44 (04/18/2019); 52 (05/18/2019); 52 (06/29/2019); FOTO = 40 (08/14/2019); 51 (09/26/2019); 60 (10/24/2019); 63 (11/07/2019);    Time  12    Period  Weeks    Status  Achieved    Target Date  01/30/20      PT LONG TERM GOAL #3   Title  Complete community, work and/or recreational activities without limitation due to current condition.    Baseline  Difficulites with walking, standing, shopping, driving, cooking and bathing, currently NWB L LE (04/18/2019); continues with similar difficulties but is now PWB25% and is able to drive, cross legs to don shoes (05/18/2019); improving but continues to have limitations and cannot perform activities that  require single leg stance full weight bearing (06/29/2019); currently limited similarly to intial eval due to recent surgery 08/03/2019 to repair non-union fracture (08/14/2019); continues to have similar limitations (08/24/2019); is improving again but is not yet weight bearing that produces similar limitations, able to shower in 30 min independently now (09/26/2019); continues to improve but is still using an assistive device and WBAT (10/24/2019); continues to improve and is currently weaning from AD, improved abilty to ambulate, cook, complete ADLs, etc (11/07/2019);    Time  12    Period  Weeks    Status  Partially Met    Target Date  01/30/20      PT LONG TERM GOAL #4   Title  Increase L knee flexion AROM to 140 degrees to be able to complete ADLs without limitation of knee flexion    Baseline  PROM 95 degrees of flexion with pain at end range and AROM 80 degrees(04/18/2019); AAROM 110 with end range pain (05/18/2019); PROM 0-126, AROM ext -25, AAROM flex 115 (06/29/2019); PROM lacking 10 degrees extension, up to 100 degrees flexion (with brace on) (08/14/2019); PROM extension lacking ~ 2 degrees, PROM flexion: 110 degrees (08/24/2019); PROM  extension to 0 degrees, PROM flexion 122 degrees (09/26/2019); 0-116 AROM; PROM 0-125 (10/24/2019); 0-132 (11/07/2019);    Time  12    Period  Weeks    Status  Partially Met    Target Date  01/30/20      PT LONG TERM GOAL #5   Title  Pt will increase LE strength to equal or greater than 4+/5 MMT in order to demonstrate improvement in strength and function.    Baseline  See objectives on IE(04/18/2019); still 2/10 L knee extension (lacking last 22 degrees against gravity) (05/18/2019); continues to lack terminal knee extension in open change position, improved to at least 4/5 with discomfort within available range (06/29/2019); has decreased in since last session due to recent surgery and away from PT 2 weeks (08/14/2019); 25 degree extensor lag (08/24/2019); has 15 degree extensor lag with etim, MMT improving - see objective (09/26/2019); improving, extensor lag now 3 degrees in ASLR (10/24/2019); continues to improve - see objective (11/07/2019);    Time  12    Period  Weeks    Status  Partially Met    Target Date  01/30/20            Plan - 11/14/19 1218    Clinical Impression Statement  Patient tolerated treatment well and continues to show steady improvement in confidence, balance, and strength. He progress to comfortably climbing and descending 6 inch stairs with BUE support using reciprocal gait. Patient continues to demonstrate more normalized gait pattern. Walked in with [facility] but did not use during duration of session.  Patient continues to have deficits in strength, balance, and ROM that is limiting his functional mobility. Patient would benefit from continued management of limiting condition by skilled physical therapist to address remaining impairments and functional limitations to work towards stated goals and return to PLOF or maximal functional independence.    Personal Factors and Comorbidities  Comorbidity 2;Age    Comorbidities  L ankle surgery; hypertension; history of falling; L  knee pain.    Examination-Activity Limitations  Bathing;Bed Mobility;Locomotion Level;Stairs;Stand;Hygiene/Grooming;Bend;Caring for Others;Sleep;Lift;Transfers;Toileting;Squat;Carry    Examination-Participation Restrictions  Yard Work;Driving;Meal Prep;Laundry;Shop;Cleaning    Stability/Clinical Decision Making  Evolving/Moderate complexity    Rehab Potential  Watson    PT Frequency  2x / week    PT Duration  12 weeks    PT Treatment/Interventions  ADLs/Self Care Home Management;Cryotherapy;Moist Heat;Gait training;Stair training;Functional mobility training;Therapeutic activities;Therapeutic exercise;Balance training;Neuromuscular re-education;Manual techniques;Joint Manipulations;Passive range of motion;Dry needling;DME Instruction    PT Next Visit Plan  Strengthening, ROM and muscle endurance, gait training when appropriate    PT Home Exercise Plan  [facility]: [id number]    Consulted and Agree with Plan of Care  Patient       Patient will benefit from skilled therapeutic intervention in order to improve the following deficits and impairments:  Abnormal gait, Decreased endurance, Decreased activity tolerance, Decreased range of motion, Decreased strength, Pain, Impaired flexibility, Difficulty walking, Decreased balance, Impaired perceived functional ability, Decreased knowledge of use of DME, Decreased mobility  Visit Diagnosis: Acute pain of left knee  Unsteadiness on feet  Muscle weakness (generalized)     Problem List Patient Active Problem List   Diagnosis Date Noted  . Hypertension   . Hyperlipidemia   . Asthma   . Tibial plateau fracture, left, closed, with nonunion, subsequent encounter 08/03/2019    Glenn Watson. Glenn Watson, PT, DPT 11/14/19, 12:19 PM  [facility] [address] [place], [address] Phone: [phone]   Fax:  [phone]  Name: Glenn Watson MRN: [id number] Date of Birth: 18-Jun-1946

## 2019-11-16 ENCOUNTER — Ambulatory Visit: Payer: PPO | Admitting: Physical Therapy

## 2019-11-16 ENCOUNTER — Other Ambulatory Visit: Payer: Self-pay

## 2019-11-16 ENCOUNTER — Encounter: Payer: Self-pay | Admitting: Physical Therapy

## 2019-11-16 DIAGNOSIS — M25562 Pain in left knee: Secondary | ICD-10-CM | POA: Diagnosis not present

## 2019-11-16 DIAGNOSIS — R2681 Unsteadiness on feet: Secondary | ICD-10-CM

## 2019-11-16 DIAGNOSIS — M6281 Muscle weakness (generalized): Secondary | ICD-10-CM

## 2019-11-16 NOTE — Therapy (Signed)
[facility] [address], [place], [address] Phone: [phone]   Fax:  [phone]  Physical Therapy Treatment  Patient Details  Name: Glenn Watson MRN: [id number] Date of Birth: [birth year] Referring Provider (PT): Altamese [place], MD   Encounter Date: 11/16/2019  PT End of Session - 11/16/19 1254    Visit Number  41    Number of Visits  9    Date for PT Re-Evaluation  01/30/20    Authorization Type  [employer] reporting period from 11/09/2019    Authorization Time Period  N/A    Authorization - Visit Number  3    Authorization - Number of Visits  10    Progress Note Due on Visit  60   Next FOTO due 5/25   PT Start Time  1120    PT Stop Time  1200    PT Time Calculation (min)  40 min    Equipment Utilized During Treatment  Gait belt    Activity Tolerance  Patient tolerated treatment well;No increased pain    Behavior During Therapy  WFL for tasks assessed/performed       Past Medical History:  Diagnosis Date  . Anemia   . Asthma    as a child, exercise asthma - none since high school"  . Cancer ([place])    skin cancer - basal, squamous- Mylenoma - stage 2- chest  . Complication of anesthesia    "very sensitive to medications" "Had colonscopy with just Draminine"  . Family history of adverse reaction to anesthesia    daughter- N/V  . Hyperlipidemia   . Hypertension   . PONV (postoperative nausea and vomiting)     Past Surgical History:  Procedure Laterality Date  . APPENDECTOMY  2011   Dr. Bary Castilla  . COLONOSCOPY WITH PROPOFOL N/A 02/10/2017   Procedure: COLONOSCOPY WITH PROPOFOL;  Surgeon: Christene Lye, MD;  Location: [facility];  Service: Endoscopy;  Laterality: N/A;  . ORIF TIBIA PLATEAU Left 08/03/2019   Procedure: OPEN REDUCTION INTERNAL FIXATION (ORIF) TIBIAL PLATEAU;  Surgeon: Altamese [place], MD;  Location: [facility];  Service: Orthopedics;  Laterality: Left;     There were no vitals filed for this visit.  Subjective Assessment - 11/16/19 1253    Subjective  Patient reports he is doing well. Went to the gym yesterday and felt he had a bit more trouble on the leg press than before. Could do 80lbs both legs but had trouble with 40lbs left leg only. has started going up stairs step over step and no longer using his cane today. did have a scare when he lost his balance on uneven ground but was able to catch himself with left foot step stratagy.    Pertinent History  L ankle surgery; hypertension; history of falling; L knee pain.    Limitations  Lifting;Standing;Walking;House hold activities   bending/extending L knee, lifiting L LE, including ADLs, IADLs, household and community mobility, social activities, hobbies, running, stairs, hiking, etc   How long can you stand comfortably?  can do dishes    Diagnostic tests  CT scan L knee report 07/18/2019 "IMPRESSION: 1. 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 2. Partial healing of the impacted fracture of the proximal fibula. 3. Moderate knee joint effusion."    Currently in Pain?  No/denies          TREATMENT: 10/25/2019 MD visit:discharge brace. Wean fromAD progressing graduallyone week at a  time each:2 axial curtches, 1 axial crutch, SPC (for balance). Okay to useelliptical and leg press. Avoidknee extension machine.  Therapeutic exercise:to centralize symptoms and improve ROM, strength, muscular endurance, and activity tolerance required for successful completion of functional activities.  -Side stepping withblacktheraband wrappedtightly arounddistal thighs toactivate core and hip stabilizers and abductors in functional                weightbearing position. Stepping a few steps out and in as allowed by band2x2 min alternating sides avery 20-30 feet. CGA for safety)        - alternating front mini lunge steps 3x20 feet with CGA for safety      - step  up to step L side up, R side down 1x10 at 6 inches, 1x10 at 8 inches. no UE support (available if needed)                     total gym squat - bilateral legs level26(top of bar), 2x15 - Left leg only 1x20 at level 20, 1x20 at level 22, 1x20 at level 24.  - Right leg only 1x12, 1x16, 1x15 at level 26 (discomfort at medial knee limiting reps at first, improved with better knee alignment).                                    Neuromuscular Re-education: to improve, balance, postural strength, muscle activation patterns, and stabilization strength required for functional activities:               - single leg stance balance practice 1x30 seconds on L LE, one x 16 seconds on R LE. - tandem stance balance practice 2x1 min each side (more difficult with R side forward).   Pt required multimodal cuing for proper technique and to facilitate improved neuromuscular control, strength, range of motion, and functional ability resulting in improved performance and form. Supervision - CGA provided for safety.    PT Education - 11/16/19 1254    Education Details  exercise purpose/form. self management techniques    Person(s) Educated  Patient    Methods  Explanation;Demonstration;Tactile cues;Verbal cues    Comprehension  Need further instruction;Verbalized understanding;Returned demonstration;Verbal cues required;Tactile cues required       PT Short Term Goals - 08/14/19 1328      PT SHORT TERM GOAL #1   Title  Be independent with initial home exercise program for self-management of symptoms.    Baseline  initial HEP provided at IE (04/18/2019);    Time  2    Period  Weeks    Status  Achieved    Target Date  05/02/19        PT Long Term Goals - 11/08/19 1354      PT LONG TERM GOAL #1   Title  Be independent with a long-term home exercise program for self-management of symptoms.    Baseline  initial HEP provided at IE (04/18/2019); continues to participate in currently appropriate HEP  (05/18/2019; 06/29/2019); updated HEP as appropriate for post-op rehab (08/14/2019); currently participating in appropriate HEP, has not yet received final long term HEP (08/24/2019; 09/26/2019; 10/24/2019; 11/07/2019);    Time  12    Period  Weeks    Status  Partially Met    Target Date  01/30/20      PT LONG TERM GOAL #2   Title  Demonstrate improved FOTO score by 10  units to demonstrate improvement in overall condition and self-reported functional ability.    Baseline  44 (04/18/2019); 52 (05/18/2019); 52 (06/29/2019); FOTO = 40 (08/14/2019); 51 (09/26/2019); 60 (10/24/2019); 63 (11/07/2019);    Time  12    Period  Weeks    Status  Achieved    Target Date  01/30/20      PT LONG TERM GOAL #3   Title  Complete community, work and/or recreational activities without limitation due to current condition.    Baseline  Difficulites with walking, standing, shopping, driving, cooking and bathing, currently NWB L LE (04/18/2019); continues with similar difficulties but is now PWB25% and is able to drive, cross legs to don shoes (05/18/2019); improving but continues to have limitations and cannot perform activities that require single leg stance full weight bearing (06/29/2019); currently limited similarly to intial eval due to recent surgery 08/03/2019 to repair non-union fracture (08/14/2019); continues to have similar limitations (08/24/2019); is improving again but is not yet weight bearing that produces similar limitations, able to shower in 30 min independently now (09/26/2019); continues to improve but is still using an assistive device and WBAT (10/24/2019); continues to improve and is currently weaning from AD, improved abilty to ambulate, cook, complete ADLs, etc (11/07/2019);    Time  12    Period  Weeks    Status  Partially Met    Target Date  01/30/20      PT LONG TERM GOAL #4   Title  Increase L knee flexion AROM to 140 degrees to be able to complete ADLs without limitation of knee flexion    Baseline   PROM 95 degrees of flexion with pain at end range and AROM 80 degrees(04/18/2019); AAROM 110 with end range pain (05/18/2019); PROM 0-126, AROM ext -25, AAROM flex 115 (06/29/2019); PROM lacking 10 degrees extension, up to 100 degrees flexion (with brace on) (08/14/2019); PROM extension lacking ~ 2 degrees, PROM flexion: 110 degrees (08/24/2019); PROM extension to 0 degrees, PROM flexion 122 degrees (09/26/2019); 0-116 AROM; PROM 0-125 (10/24/2019); 0-132 (11/07/2019);    Time  12    Period  Weeks    Status  Partially Met    Target Date  01/30/20      PT LONG TERM GOAL #5   Title  Pt will increase LE strength to equal or greater than 4+/5 MMT in order to demonstrate improvement in strength and function.    Baseline  See objectives on IE(04/18/2019); still 2/10 L knee extension (lacking last 22 degrees against gravity) (05/18/2019); continues to lack terminal knee extension in open change position, improved to at least 4/5 with discomfort within available range (06/29/2019); has decreased in since last session due to recent surgery and away from PT 2 weeks (08/14/2019); 25 degree extensor lag (08/24/2019); has 15 degree extensor lag with etim, MMT improving - see objective (09/26/2019); improving, extensor lag now 3 degrees in ASLR (10/24/2019); continues to improve - see objective (11/07/2019);    Time  12    Period  Weeks    Status  Partially Met    Target Date  01/30/20            Plan - 11/16/19 1259    Clinical Impression Statement  Patient tolerated treatment well overall and continues to make progress towards goals at each session. Able to step up to higher step and use more resistance on leg press machine. Continues to show decreased balance, coordination, and strength on L LE. Patient would benefit from continued  management of limiting condition by skilled physical therapist to address remaining impairments and functional limitations to work towards stated goals and return to PLOF or maximal  functional independence.    Personal Factors and Comorbidities  Comorbidity 2;Age    Comorbidities  L ankle surgery; hypertension; history of falling; L knee pain.    Examination-Activity Limitations  Bathing;Bed Mobility;Locomotion Level;Stairs;Stand;Hygiene/Grooming;Bend;Caring for Others;Sleep;Lift;Transfers;Toileting;Squat;Carry    Examination-Participation Restrictions  Yard Work;Driving;Meal Prep;Laundry;Shop;Cleaning    Stability/Clinical Decision Making  Evolving/Moderate complexity    Rehab Potential  Good    PT Frequency  2x / week    PT Duration  12 weeks    PT Treatment/Interventions  ADLs/Self Care Home Management;Cryotherapy;Moist Heat;Gait training;Stair training;Functional mobility training;Therapeutic activities;Therapeutic exercise;Balance training;Neuromuscular re-education;Manual techniques;Joint Manipulations;Passive range of motion;Dry needling;DME Instruction    PT Next Visit Plan  Strengthening, ROM and muscle endurance, gait training when appropriate    PT Home Exercise Plan  Medbridge: [email or web address]    Consulted and Agree with Plan of Care  Patient       Patient will benefit from skilled therapeutic intervention in order to improve the following deficits and impairments:  Abnormal gait, Decreased endurance, Decreased activity tolerance, Decreased range of motion, Decreased strength, Pain, Impaired flexibility, Difficulty walking, Decreased balance, Impaired perceived functional ability, Decreased knowledge of use of DME, Decreased mobility  Visit Diagnosis: Acute pain of left knee  Unsteadiness on feet  Muscle weakness (generalized)     Problem List Patient Active Problem List   Diagnosis Date Noted  . Hypertension   . Hyperlipidemia   . Asthma   . Tibial plateau fracture, left, closed, with nonunion, subsequent encounter 08/03/2019   Everlean Alstrom. Graylon Good, PT, DPT 11/16/19, 12:59 PM  [facility] [address], [place], [address] Phone: [phone]   Fax:  [phone]  Name: Glenn Watson MRN: [id number] Date of Birth: November 28, 1945

## 2019-11-21 ENCOUNTER — Encounter: Payer: Self-pay | Admitting: Physical Therapy

## 2019-11-21 ENCOUNTER — Ambulatory Visit: Payer: PPO | Admitting: Physical Therapy

## 2019-11-21 ENCOUNTER — Other Ambulatory Visit: Payer: Self-pay

## 2019-11-21 DIAGNOSIS — M6281 Muscle weakness (generalized): Secondary | ICD-10-CM

## 2019-11-21 DIAGNOSIS — M25562 Pain in left knee: Secondary | ICD-10-CM

## 2019-11-21 DIAGNOSIS — R2681 Unsteadiness on feet: Secondary | ICD-10-CM

## 2019-11-21 NOTE — Therapy (Signed)
[facility] [address], [place], [address] Phone: [phone]   Fax:  [phone]  Physical Therapy Treatment  Patient Details  Name: TZION WEDEL MRN: [id number] Date of Birth: 1945/09/15 Referring Provider (PT): Altamese [place], MD   Encounter Date: 11/21/2019  PT End of Session - 11/21/19 1752    Visit Number  56    Number of Visits  22    Date for PT Re-Evaluation  01/30/20    Authorization Type  [employer] reporting period from 11/09/2019    Authorization Time Period  N/A    Authorization - Visit Number  4    Authorization - Number of Visits  10    Progress Note Due on Visit  60   Next FOTO due 5/25   PT Start Time  1650    PT Stop Time  1740    PT Time Calculation (min)  50 min    Equipment Utilized During Treatment  Gait belt    Activity Tolerance  Patient tolerated treatment well;No increased pain    Behavior During Therapy  WFL for tasks assessed/performed       Past Medical History:  Diagnosis Date  . Anemia   . Asthma    as a child, exercise asthma - none since high school"  . Cancer ([place])    skin cancer - basal, squamous- Mylenoma - stage 2- chest  . Complication of anesthesia    "very sensitive to medications" "Had colonscopy with just Draminine"  . Family history of adverse reaction to anesthesia    daughter- N/V  . Hyperlipidemia   . Hypertension   . PONV (postoperative nausea and vomiting)     Past Surgical History:  Procedure Laterality Date  . APPENDECTOMY  2011   Dr. Bary Castilla  . COLONOSCOPY WITH PROPOFOL N/A 02/10/2017   Procedure: COLONOSCOPY WITH PROPOFOL;  Surgeon: Christene Lye, MD;  Location: [facility];  Service: Endoscopy;  Laterality: N/A;  . ORIF TIBIA PLATEAU Left 08/03/2019   Procedure: OPEN REDUCTION INTERNAL FIXATION (ORIF) TIBIAL PLATEAU;  Surgeon: Altamese [place], MD;  Location: [facility];  Service: Orthopedics;  Laterality: Left;     There were no vitals filed for this visit.  Subjective Assessment - 11/21/19 1652    Subjective  Patient reports he has been to the gym twice since last session. He did leg presses at 100 lbs, step ups, and up to 30 min elliptical with resistance with slight increase in soreness near plate following. No pain upon arrival or soreness following last treatment session. would like to work on getting up and down from the floor so he can do his back exercises there.    Pertinent History  L ankle surgery; hypertension; history of falling; L knee pain.    Limitations  Lifting;Standing;Walking;House hold activities   bending/extending L knee, lifiting L LE, including ADLs, IADLs, household and community mobility, social activities, hobbies, running, stairs, hiking, etc   How long can you stand comfortably?  can do dishes    Diagnostic tests  CT scan L knee report 07/18/2019 "IMPRESSION: 1. 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 2. Partial healing of the impacted fracture of the proximal fibula. 3. Moderate knee joint effusion."    Currently in Pain?  No/denies       TREATMENT: 10/25/2019 MD visit:discharge brace. Wean fromAD progressing graduallyone week at a time each:2 axial curtches, 1 axial crutch, SPC (for balance). Okay to  useelliptical and leg press. Avoidknee extension machine.  Therapeutic exercise:to centralize symptoms and improve ROM, strength, muscular endurance, and activity tolerance required for successful completion of functional activities.   - stand <> supine and stand <> quadruped floor transfers, initially mod I with chair, then without chair x 2.  - back lung at bar x 10 each side. (very difficult)  total gym squat - bilateral legs level26(top of bar), 1x20 - Left leg only 1x20 at level 24, 2x20 at level 26 - Right leg only3x20 level 26    Neuromuscular Re-education: to improve, balance, postural strength, muscle activation  patterns, and stabilization strength required for functional activities: - standing lateral toe slides with furniture slider with BUE support, 2x20 each side with slight knee flexion, focusing on balance and stability of support LE to improve SLS tolerance and balance.  - single leg stance: R x 30 seconds, L x working for 30 seconds (unable to maintain balance for more than 1-2 seconds at a time.  - tandem stance x 30 seconds each side - ball toss at rebounder using 2kg ball with self selected stance x 20 - ball toss at rebounder using 2kg ball with tandem stance 3x10 each side. Touchdown UE support as needed.   Pt required multimodal cuing for proper technique and to facilitate improved neuromuscular control, strength, range of motion, and functional ability resulting in improved performance and form.Supervision - CGA provided for safety.    PT Education - 11/21/19 1655    Education Details  exercise purpose/form. self management techniques    Person(s) Educated  Patient    Methods  Explanation;Demonstration;Tactile cues;Verbal cues    Comprehension  Verbalized understanding;Returned demonstration;Verbal cues required;Tactile cues required;Need further instruction       PT Short Term Goals - 08/14/19 1328      PT SHORT TERM GOAL #1   Title  Be independent with initial home exercise program for self-management of symptoms.    Baseline  initial HEP provided at IE (04/18/2019);    Time  2    Period  Weeks    Status  Achieved    Target Date  05/02/19        PT Long Term Goals - 11/08/19 1354      PT LONG TERM GOAL #1   Title  Be independent with a long-term home exercise program for self-management of symptoms.    Baseline  initial HEP provided at IE (04/18/2019); continues to participate in currently appropriate HEP (05/18/2019; 06/29/2019); updated HEP as appropriate for post-op rehab (08/14/2019); currently participating in appropriate HEP, has not yet received final long term  HEP (08/24/2019; 09/26/2019; 10/24/2019; 11/07/2019);    Time  12    Period  Weeks    Status  Partially Met    Target Date  01/30/20      PT LONG TERM GOAL #2   Title  Demonstrate improved FOTO score by 10 units to demonstrate improvement in overall condition and self-reported functional ability.    Baseline  44 (04/18/2019); 52 (05/18/2019); 52 (06/29/2019); FOTO = 40 (08/14/2019); 51 (09/26/2019); 60 (10/24/2019); 63 (11/07/2019);    Time  12    Period  Weeks    Status  Achieved    Target Date  01/30/20      PT LONG TERM GOAL #3   Title  Complete community, work and/or recreational activities without limitation due to current condition.    Baseline  Difficulites with walking, standing, shopping, driving, cooking and bathing, currently NWB L  LE (04/18/2019); continues with similar difficulties but is now PWB25% and is able to drive, cross legs to don shoes (05/18/2019); improving but continues to have limitations and cannot perform activities that require single leg stance full weight bearing (06/29/2019); currently limited similarly to intial eval due to recent surgery 08/03/2019 to repair non-union fracture (08/14/2019); continues to have similar limitations (08/24/2019); is improving again but is not yet weight bearing that produces similar limitations, able to shower in 30 min independently now (09/26/2019); continues to improve but is still using an assistive device and WBAT (10/24/2019); continues to improve and is currently weaning from AD, improved abilty to ambulate, cook, complete ADLs, etc (11/07/2019);    Time  12    Period  Weeks    Status  Partially Met    Target Date  01/30/20      PT LONG TERM GOAL #4   Title  Increase L knee flexion AROM to 140 degrees to be able to complete ADLs without limitation of knee flexion    Baseline  PROM 95 degrees of flexion with pain at end range and AROM 80 degrees(04/18/2019); AAROM 110 with end range pain (05/18/2019); PROM 0-126, AROM ext -25, AAROM flex  115 (06/29/2019); PROM lacking 10 degrees extension, up to 100 degrees flexion (with brace on) (08/14/2019); PROM extension lacking ~ 2 degrees, PROM flexion: 110 degrees (08/24/2019); PROM extension to 0 degrees, PROM flexion 122 degrees (09/26/2019); 0-116 AROM; PROM 0-125 (10/24/2019); 0-132 (11/07/2019);    Time  12    Period  Weeks    Status  Partially Met    Target Date  01/30/20      PT LONG TERM GOAL #5   Title  Pt will increase LE strength to equal or greater than 4+/5 MMT in order to demonstrate improvement in strength and function.    Baseline  See objectives on IE(04/18/2019); still 2/10 L knee extension (lacking last 22 degrees against gravity) (05/18/2019); continues to lack terminal knee extension in open change position, improved to at least 4/5 with discomfort within available range (06/29/2019); has decreased in since last session due to recent surgery and away from PT 2 weeks (08/14/2019); 25 degree extensor lag (08/24/2019); has 15 degree extensor lag with etim, MMT improving - see objective (09/26/2019); improving, extensor lag now 3 degrees in ASLR (10/24/2019); continues to improve - see objective (11/07/2019);    Time  12    Period  Weeks    Status  Partially Met    Target Date  01/30/20            Plan - 11/21/19 1758    Clinical Impression Statement  Patient tolerated treatment well and was able to progress to floor transfers without assistance but still needed additional time. Focused on balance and progressive quad and LE strength today. Continues to improve resistance on total gym, continues to have difficulty with lunges. Patient would benefit from continued management of limiting condition by skilled physical therapist to address remaining impairments and functional limitations to work towards stated goals and return to PLOF or maximal functional independence.    Personal Factors and Comorbidities  Comorbidity 2;Age    Comorbidities  L ankle surgery; hypertension; history  of falling; L knee pain.    Examination-Activity Limitations  Bathing;Bed Mobility;Locomotion Level;Stairs;Stand;Hygiene/Grooming;Bend;Caring for Others;Sleep;Lift;Transfers;Toileting;Squat;Carry    Examination-Participation Restrictions  Yard Work;Driving;Meal Prep;Laundry;Shop;Cleaning    Stability/Clinical Decision Making  Evolving/Moderate complexity    Rehab Potential  Good    PT Frequency  2x / week  PT Duration  12 weeks    PT Treatment/Interventions  ADLs/Self Care Home Management;Cryotherapy;Moist Heat;Gait training;Stair training;Functional mobility training;Therapeutic activities;Therapeutic exercise;Balance training;Neuromuscular re-education;Manual techniques;Joint Manipulations;Passive range of motion;Dry needling;DME Instruction    PT Next Visit Plan  Strengthening, ROM and muscle endurance, gait training when appropriate    PT Home Exercise Plan  Medbridge: [email or web address]    Consulted and Agree with Plan of Care  Patient       Patient will benefit from skilled therapeutic intervention in order to improve the following deficits and impairments:  Abnormal gait, Decreased endurance, Decreased activity tolerance, Decreased range of motion, Decreased strength, Pain, Impaired flexibility, Difficulty walking, Decreased balance, Impaired perceived functional ability, Decreased knowledge of use of DME, Decreased mobility  Visit Diagnosis: Acute pain of left knee  Unsteadiness on feet  Muscle weakness (generalized)     Problem List Patient Active Problem List   Diagnosis Date Noted  . Hypertension   . Hyperlipidemia   . Asthma   . Tibial plateau fracture, left, closed, with nonunion, subsequent encounter 08/03/2019   Everlean Alstrom. Graylon Good, PT, DPT 11/21/19, 6:00 PM  [facility] [address] [place], [address] Phone: [phone]   Fax:  [phone]  Name: JUNE VACHA MRN: [id number] Date of Birth:  1946-02-04

## 2019-11-23 ENCOUNTER — Other Ambulatory Visit: Payer: Self-pay

## 2019-11-23 ENCOUNTER — Ambulatory Visit: Payer: PPO | Admitting: Physical Therapy

## 2019-11-23 ENCOUNTER — Encounter: Payer: Self-pay | Admitting: Physical Therapy

## 2019-11-23 DIAGNOSIS — M6281 Muscle weakness (generalized): Secondary | ICD-10-CM

## 2019-11-23 DIAGNOSIS — M25562 Pain in left knee: Secondary | ICD-10-CM | POA: Diagnosis not present

## 2019-11-23 DIAGNOSIS — R2681 Unsteadiness on feet: Secondary | ICD-10-CM

## 2019-11-23 NOTE — Therapy (Signed)
[facility] [address] [place], [address] Phone: [phone]   Fax:  [phone]  Physical Therapy Treatment  Patient Details  Name: Glenn Watson MRN: [id number] Date of Birth: [birth year] Referring Provider (PT): Altamese [place], MD   Encounter Date: 11/23/2019  PT End of Session - 11/23/19 1352    Visit Number  55    Number of Visits  21    Date for PT Re-Evaluation  01/30/20    Authorization Type  [employer] reporting period from 11/09/2019    Authorization Time Period  N/A    Authorization - Visit Number  5    Authorization - Number of Visits  10    Progress Note Due on Visit  60   Next FOTO due 5/25   PT Start Time  1115    PT Stop Time  1200    PT Time Calculation (min)  45 min    Equipment Utilized During Treatment  Gait belt    Activity Tolerance  Patient tolerated treatment well;No increased pain    Behavior During Therapy  WFL for tasks assessed/performed       Past Medical History:  Diagnosis Date  . Anemia   . Asthma    as a child, exercise asthma - none since high school"  . Cancer ([place])    skin cancer - basal, squamous- Mylenoma - stage 2- chest  . Complication of anesthesia    "very sensitive to medications" "Had colonscopy with just Draminine"  . Family history of adverse reaction to anesthesia    daughter- N/V  . Hyperlipidemia   . Hypertension   . PONV (postoperative nausea and vomiting)     Past Surgical History:  Procedure Laterality Date  . APPENDECTOMY  2011   Dr. Bary Castilla  . COLONOSCOPY WITH PROPOFOL N/A 02/10/2017   Procedure: COLONOSCOPY WITH PROPOFOL;  Surgeon: Christene Lye, MD;  Location: [facility];  Service: Endoscopy;  Laterality: N/A;  . ORIF TIBIA PLATEAU Left 08/03/2019   Procedure: OPEN REDUCTION INTERNAL FIXATION (ORIF) TIBIAL PLATEAU;  Surgeon: Altamese [place], MD;  Location: [facility];  Service: Orthopedics;  Laterality: Left;     There were no vitals filed for this visit.  Subjective Assessment - 11/23/19 1113    Subjective  Patient reports he is feeling well today. He was in the car most of the day yesterday. He is feeling a lot better after the upper back and door frame stretch .    Pertinent History  L ankle surgery; hypertension; history of falling; L knee pain.    Limitations  Lifting;Standing;Walking;House hold activities   bending/extending L knee, lifiting L LE, including ADLs, IADLs, household and community mobility, social activities, hobbies, running, stairs, hiking, etc   How long can you stand comfortably?  can do dishes    Diagnostic tests  CT scan L knee report 07/18/2019 "IMPRESSION: 1. 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 2. Partial healing of the impacted fracture of the proximal fibula. 3. Moderate knee joint effusion."    Currently in Pain?  No/denies        TREATMENT: 10/25/2019 MD visit:discharge brace. Wean fromAD progressing graduallyone week at a time each:2 axial curtches, 1 axial crutch, SPC (for balance). Okay to useelliptical and leg press. Avoidknee extension machine.     Therapeutic exercise:to centralize symptoms and improve ROM, strength, muscular endurance, and activity tolerance required for successful completion of functional activities.   -Side  stepping withblacktheraband wrappedtightly arounddistal thighs toactivate core and hip stabilizers and abductors in functional weightbearing position. Stepping a few steps out and in as allowed by band1x2 min, with yellow band and ankles x 2 min, alternating sides avery 20-30 feet. (CGA for safety)    - bent over hip abduction/extension diagonal with yellow theraband around ankles. 2x20. Pt reports feeling of strong glute contraction.  - back lung L side to bar, R leg forward. Unable to hinge properly at hip. Discontinued after many attempts with various cuing efforts. - squat form  practice with various external cues. Progressed to buttocks tap from 26.5 inch surface while holding 7# DB with both hands as a counter balance weight. 3x10. Chair in front of knees as external cue to prevent excessive anterior translation of knees and lack of hip flexion. - Education on HEP including handout   Neuromuscular Re-education: to improve, balance, postural strength, muscle activation patterns, and stabilization strength required for functional activities: - standing lateral toe slides with furniture slider with BUE support,2x20 each side with slight knee flexion, focusing on balance and stability of support LE to improve SLS tolerance and balance.   Pt required multimodal cuing for proper technique and to facilitate improved neuromuscular control, strength, range of motion, and functional ability resulting in improved performance and form.Supervision - CGA provided for safety.   HOME EXERCISE PROGRAM Access Code: [email or web address] URL: [email or web address] Date: 09/19/2019 Prepared by: Rosita Kea  Exercises Doorway Pec Stretch at 90 Degrees Abduction - 1-2 x daily - 3 reps - 30 seconds hold Diagonal Hip Extension - 1 x daily - 3 sets - 10 reps - 4 seconds hold Squat with Chair Touch - 1 x daily - 3 sets - 10 reps   PT Education - 11/23/19 1114    Education Details  exercise purpose/form. self management techniques    Person(s) Educated  Patient    Methods  Explanation;Demonstration;Tactile cues;Verbal cues    Comprehension  Verbalized understanding;Returned demonstration;Verbal cues required;Tactile cues required;Need further instruction       PT Short Term Goals - 08/14/19 1328      PT SHORT TERM GOAL #1   Title  Be independent with initial home exercise program for self-management of symptoms.    Baseline  initial HEP provided at IE (04/18/2019);    Time  2    Period  Weeks    Status  Achieved    Target Date  05/02/19        PT Long Term Goals -  11/08/19 1354      PT LONG TERM GOAL #1   Title  Be independent with a long-term home exercise program for self-management of symptoms.    Baseline  initial HEP provided at IE (04/18/2019); continues to participate in currently appropriate HEP (05/18/2019; 06/29/2019); updated HEP as appropriate for post-op rehab (08/14/2019); currently participating in appropriate HEP, has not yet received final long term HEP (08/24/2019; 09/26/2019; 10/24/2019; 11/07/2019);    Time  12    Period  Weeks    Status  Partially Met    Target Date  01/30/20      PT LONG TERM GOAL #2   Title  Demonstrate improved FOTO score by 10 units to demonstrate improvement in overall condition and self-reported functional ability.    Baseline  44 (04/18/2019); 52 (05/18/2019); 52 (06/29/2019); FOTO = 40 (08/14/2019); 51 (09/26/2019); 60 (10/24/2019); 63 (11/07/2019);    Time  12    Period  Weeks    Status  Achieved  Target Date  01/30/20      PT LONG TERM GOAL #3   Title  Complete community, work and/or recreational activities without limitation due to current condition.    Baseline  Difficulites with walking, standing, shopping, driving, cooking and bathing, currently NWB L LE (04/18/2019); continues with similar difficulties but is now PWB25% and is able to drive, cross legs to don shoes (05/18/2019); improving but continues to have limitations and cannot perform activities that require single leg stance full weight bearing (06/29/2019); currently limited similarly to intial eval due to recent surgery 08/03/2019 to repair non-union fracture (08/14/2019); continues to have similar limitations (08/24/2019); is improving again but is not yet weight bearing that produces similar limitations, able to shower in 30 min independently now (09/26/2019); continues to improve but is still using an assistive device and WBAT (10/24/2019); continues to improve and is currently weaning from AD, improved abilty to ambulate, cook, complete ADLs, etc  (11/07/2019);    Time  12    Period  Weeks    Status  Partially Met    Target Date  01/30/20      PT LONG TERM GOAL #4   Title  Increase L knee flexion AROM to 140 degrees to be able to complete ADLs without limitation of knee flexion    Baseline  PROM 95 degrees of flexion with pain at end range and AROM 80 degrees(04/18/2019); AAROM 110 with end range pain (05/18/2019); PROM 0-126, AROM ext -25, AAROM flex 115 (06/29/2019); PROM lacking 10 degrees extension, up to 100 degrees flexion (with brace on) (08/14/2019); PROM extension lacking ~ 2 degrees, PROM flexion: 110 degrees (08/24/2019); PROM extension to 0 degrees, PROM flexion 122 degrees (09/26/2019); 0-116 AROM; PROM 0-125 (10/24/2019); 0-132 (11/07/2019);    Time  12    Period  Weeks    Status  Partially Met    Target Date  01/30/20      PT LONG TERM GOAL #5   Title  Pt will increase LE strength to equal or greater than 4+/5 MMT in order to demonstrate improvement in strength and function.    Baseline  See objectives on IE(04/18/2019); still 2/10 L knee extension (lacking last 22 degrees against gravity) (05/18/2019); continues to lack terminal knee extension in open change position, improved to at least 4/5 with discomfort within available range (06/29/2019); has decreased in since last session due to recent surgery and away from PT 2 weeks (08/14/2019); 25 degree extensor lag (08/24/2019); has 15 degree extensor lag with etim, MMT improving - see objective (09/26/2019); improving, extensor lag now 3 degrees in ASLR (10/24/2019); continues to improve - see objective (11/07/2019);    Time  12    Period  Weeks    Status  Partially Met    Target Date  01/30/20            Plan - 11/23/19 1454    Clinical Impression Statement  Patient tolerated treatment well overall. He found the squatting exercise very fatiguing and demonstrated significant improvement in squat technique with the use of multimodal cuing. Also demonstrated good glute  activation with bent over hip exercise. Patient continues to ambulate with a limp favoring L LE but is showing improved posture. Patient would benefit from continued management of limiting condition by skilled physical therapist to address remaining impairments and functional limitations to work towards stated goals and return to PLOF or maximal functional independence.    Personal Factors and Comorbidities  Comorbidity 2;Age    Comorbidities  L ankle surgery; hypertension; history of falling; L knee pain.    Examination-Activity Limitations  Bathing;Bed Mobility;Locomotion Level;Stairs;Stand;Hygiene/Grooming;Bend;Caring for Others;Sleep;Lift;Transfers;Toileting;Squat;Carry    Examination-Participation Restrictions  Yard Work;Driving;Meal Prep;Laundry;Shop;Cleaning    Stability/Clinical Decision Making  Evolving/Moderate complexity    Rehab Potential  Good    PT Frequency  2x / week    PT Duration  12 weeks    PT Treatment/Interventions  ADLs/Self Care Home Management;Cryotherapy;Moist Heat;Gait training;Stair training;Functional mobility training;Therapeutic activities;Therapeutic exercise;Balance training;Neuromuscular re-education;Manual techniques;Joint Manipulations;Passive range of motion;Dry needling;DME Instruction    PT Next Visit Plan  Strengthening, ROM and muscle endurance, gait training when appropriate    PT Home Exercise Plan  Medbridge: [email or web address]    Consulted and Agree with Plan of Care  Patient       Patient will benefit from skilled therapeutic intervention in order to improve the following deficits and impairments:  Abnormal gait, Decreased endurance, Decreased activity tolerance, Decreased range of motion, Decreased strength, Pain, Impaired flexibility, Difficulty walking, Decreased balance, Impaired perceived functional ability, Decreased knowledge of use of DME, Decreased mobility  Visit Diagnosis: Acute pain of left knee  Unsteadiness on feet  Muscle weakness  (generalized)     Problem List Patient Active Problem List   Diagnosis Date Noted  . Hypertension   . Hyperlipidemia   . Asthma   . Tibial plateau fracture, left, closed, with nonunion, subsequent encounter 08/03/2019    Everlean Alstrom. Graylon Good, PT, DPT 11/23/19, 2:56 PM  [facility] [address], [place], [address] Phone: [phone]   Fax:  [phone]  Name: MERCEDES VALERIANO MRN: [id number] Date of Birth: [birth year]

## 2019-11-28 ENCOUNTER — Encounter: Payer: Self-pay | Admitting: Physical Therapy

## 2019-11-28 ENCOUNTER — Other Ambulatory Visit: Payer: Self-pay

## 2019-11-28 ENCOUNTER — Ambulatory Visit: Payer: PPO | Attending: Orthopedic Surgery | Admitting: Physical Therapy

## 2019-11-28 DIAGNOSIS — R2681 Unsteadiness on feet: Secondary | ICD-10-CM

## 2019-11-28 DIAGNOSIS — M6281 Muscle weakness (generalized): Secondary | ICD-10-CM | POA: Diagnosis not present

## 2019-11-28 DIAGNOSIS — M25562 Pain in left knee: Secondary | ICD-10-CM

## 2019-11-28 NOTE — Therapy (Signed)
[facility] [address], [place], [address] Phone: [phone]   Fax:  [phone]  Physical Therapy Treatment  Patient Details  Name: Glenn Watson MRN: [id number] Date of Birth: 1946-06-23 Referring Provider (PT): Altamese [place], MD   Encounter Date: 11/28/2019  PT End of Session - 11/28/19 1314    Visit Number  75    Number of Visits  11    Date for PT Re-Evaluation  01/30/20    Authorization Type  [employer] reporting period from 11/09/2019    Authorization Time Period  N/A    Authorization - Visit Number  6    Authorization - Number of Visits  10    Progress Note Due on Visit  60    PT Start Time  1120    PT Stop Time  1200    PT Time Calculation (min)  40 min    Equipment Utilized During Treatment  Gait belt    Activity Tolerance  Patient tolerated treatment well;No increased pain;Patient limited by fatigue    Behavior During Therapy  [facility] for tasks assessed/performed       Past Medical History:  Diagnosis Date   Anemia    Asthma    as a child, exercise asthma - none since high school"   Cancer [facility])    skin cancer - basal, squamous- Mylenoma - stage 2- chest   Complication of anesthesia    "very sensitive to medications" "Had colonscopy with just Draminine"   Family history of adverse reaction to anesthesia    daughter- N/V   Hyperlipidemia    Hypertension    PONV (postoperative nausea and vomiting)     Past Surgical History:  Procedure Laterality Date   APPENDECTOMY  2011   Dr. Bary Castilla   COLONOSCOPY WITH PROPOFOL N/A 02/10/2017   Procedure: COLONOSCOPY WITH PROPOFOL;  Surgeon: Christene Lye, MD;  Location: [facility] ENDOSCOPY;  Service: Endoscopy;  Laterality: N/A;   ORIF TIBIA PLATEAU Left 08/03/2019   Procedure: OPEN REDUCTION INTERNAL FIXATION (ORIF) TIBIAL PLATEAU;  Surgeon: Altamese [place], MD;  Location: [place];  Service: Orthopedics;  Laterality: Left;     There were no vitals filed for this visit.  Subjective Assessment - 11/28/19 1312    Subjective  Patient reports he is feeling well today. Had some soreness in the glutes following last treatment session. Has noticed he has pain behind his right knee when doing his supine leg lift hamstring stretches. Exercises at home going well. Was alble to descend his stairs (flight of 14 steps) with reciprocal gait this morning. States he will see Dr. Marcelino Scot tomorrow.    Pertinent History  L ankle surgery; hypertension; history of falling; L knee pain.    Limitations  Lifting;Standing;Walking;House hold activities   bending/extending L knee, lifiting L LE, including ADLs, IADLs, household and community mobility, social activities, hobbies, running, stairs, hiking, etc   How long can you stand comfortably?  can do dishes    Diagnostic tests  CT scan L knee report 07/18/2019 "IMPRESSION: 1. 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 2. Partial healing of the impacted fracture of the proximal fibula. 3. Moderate knee joint effusion."    Currently in Pain?  No/denies      OBJECTIVE  FOTO = 63 (11/28/2019)  L Knee PROM: 0-125  R SLS = 28 seconds, increased sway L SLS = 7 seconds, increased sway  TREATMENT: 10/25/2019 MD visit:discharge brace. Wean  fromAD progressing graduallyone week at a time each:2 axial curtches, 1 axial crutch, SPC (for balance). Okay to useelliptical and leg press. Avoidknee extension machine.  Therapeutic exercise:to centralize symptoms and improve ROM, strength, muscular endurance, and activity tolerance required for successful completion of functional activities.  - bent over hip abduction/extension diagonal with yellow theraband around ankles. 3x20. Cuing for improved lateral excursion and 2-count hold.   total gym squat - bilateral legs level26(top of bar),1x20 - Left leg only 3x10 at level 26 - Right leg only1x20, 1x10, 1x6  level26  Seated leg press:  - R LE 1x10, 2x15 at 65# (no limitation, rated medium difficulty) - L LE 1x10, 2x15 at 65# (limited by discomfort at proximal tibia, rated medium difficulty).   Supine  - ROM measurement - assessment of sciatic neural tension with SLR (appears normal bilaterally)  - Education on HEP including handout   Neuromuscular Re-education:to improve, balance, postural strength, muscle activation patterns, and stabilization strength required for functional activities: - standing single leg stance practice 2x30 second each side  Pt required multimodal cuing for proper technique and to facilitate improved neuromuscular control, strength, range of motion, and functional ability resulting in improved performance and form.Supervision - CGA provided for safety.   HOME EXERCISE PROGRAM Access Code: [email or web address] URL: [email or web address] Date: 09/19/2019 Prepared by: Rosita Kea  Exercises Doorway Pec Stretch at 90 Degrees Abduction - 1-2 x daily - 3 reps - 30 seconds hold Diagonal Hip Extension - 1 x daily - 3 sets - 10 reps - 4 seconds hold Squat with Chair Touch - 1 x daily - 3 sets - 10 reps    PT Education - 11/28/19 1313    Education Details  exercise purpose/form. self management techniques. progress, POC    Person(s) Educated  Patient    Methods  Explanation;Demonstration;Tactile cues;Verbal cues    Comprehension  Verbalized understanding;Returned demonstration;Verbal cues required;Tactile cues required;Need further instruction       PT Short Term Goals - 08/14/19 1328      PT SHORT TERM GOAL #1   Title  Be independent with initial home exercise program for self-management of symptoms.    Baseline  initial HEP provided at IE (04/18/2019);    Time  2    Period  Weeks    Status  Achieved    Target Date  05/02/19        PT Long Term Goals - 11/08/19 1354      PT LONG TERM GOAL #1   Title  Be independent with a long-term home  exercise program for self-management of symptoms.    Baseline  initial HEP provided at IE (04/18/2019); continues to participate in currently appropriate HEP (05/18/2019; 06/29/2019); updated HEP as appropriate for post-op rehab (08/14/2019); currently participating in appropriate HEP, has not yet received final long term HEP (08/24/2019; 09/26/2019; 10/24/2019; 11/07/2019);    Time  12    Period  Weeks    Status  Partially Met    Target Date  01/30/20      PT LONG TERM GOAL #2   Title  Demonstrate improved FOTO score by 10 units to demonstrate improvement in overall condition and self-reported functional ability.    Baseline  44 (04/18/2019); 52 (05/18/2019); 52 (06/29/2019); FOTO = 40 (08/14/2019); 51 (09/26/2019); 60 (10/24/2019); 63 (11/07/2019);    Time  12    Period  Weeks    Status  Achieved    Target Date  01/30/20      PT LONG TERM  GOAL #3   Title  Complete community, work and/or recreational activities without limitation due to current condition.    Baseline  Difficulites with walking, standing, shopping, driving, cooking and bathing, currently NWB L LE (04/18/2019); continues with similar difficulties but is now PWB25% and is able to drive, cross legs to don shoes (05/18/2019); improving but continues to have limitations and cannot perform activities that require single leg stance full weight bearing (06/29/2019); currently limited similarly to intial eval due to recent surgery 08/03/2019 to repair non-union fracture (08/14/2019); continues to have similar limitations (08/24/2019); is improving again but is not yet weight bearing that produces similar limitations, able to shower in 30 min independently now (09/26/2019); continues to improve but is still using an assistive device and WBAT (10/24/2019); continues to improve and is currently weaning from AD, improved abilty to ambulate, cook, complete ADLs, etc (11/07/2019);    Time  12    Period  Weeks    Status  Partially Met    Target Date  01/30/20       PT LONG TERM GOAL #4   Title  Increase L knee flexion AROM to 140 degrees to be able to complete ADLs without limitation of knee flexion    Baseline  PROM 95 degrees of flexion with pain at end range and AROM 80 degrees(04/18/2019); AAROM 110 with end range pain (05/18/2019); PROM 0-126, AROM ext -25, AAROM flex 115 (06/29/2019); PROM lacking 10 degrees extension, up to 100 degrees flexion (with brace on) (08/14/2019); PROM extension lacking ~ 2 degrees, PROM flexion: 110 degrees (08/24/2019); PROM extension to 0 degrees, PROM flexion 122 degrees (09/26/2019); 0-116 AROM; PROM 0-125 (10/24/2019); 0-132 (11/07/2019);    Time  12    Period  Weeks    Status  Partially Met    Target Date  01/30/20      PT LONG TERM GOAL #5   Title  Pt will increase LE strength to equal or greater than 4+/5 MMT in order to demonstrate improvement in strength and function.    Baseline  See objectives on IE(04/18/2019); still 2/10 L knee extension (lacking last 22 degrees against gravity) (05/18/2019); continues to lack terminal knee extension in open change position, improved to at least 4/5 with discomfort within available range (06/29/2019); has decreased in since last session due to recent surgery and away from PT 2 weeks (08/14/2019); 25 degree extensor lag (08/24/2019); has 15 degree extensor lag with etim, MMT improving - see objective (09/26/2019); improving, extensor lag now 3 degrees in ASLR (10/24/2019); continues to improve - see objective (11/07/2019);    Time  12    Period  Weeks    Status  Partially Met    Target Date  01/30/20            Plan - 11/28/19 1320    Clinical Impression Statement  Patient has attended 56 physical therapy treatment sessions this episode of care and continues to make steady progress at this point. Focus of recent PT sessions is progressive LE strengthening focusing on quads and hips. Use of close chain and leg press type exercises prioritized to reduce sheer across tibia and leg  extension machine avoided. Patient is showing good improvements in functional mobility but continues to demonstrate impairments such as inadequate muscle performance (strength/power/endurance), reduced ROM and joint stiffness, impaired neuromuscular control and balance which leads to functional deficits such as antalgic gait favoring L LE when not consciously working to walk with normal gait, is unable to stand from  sitting without use of hands, requires UE support for stair navigation, cannot do lunge with proper mechanics, has difficulty with step ups to higher surface, etc. Patient would benefit from continued management of limiting condition by skilled physical therapist to address remaining impairments and functional limitations to work towards stated goals and return to PLOF or maximal functional independence.    Personal Factors and Comorbidities  Comorbidity 2;Age    Comorbidities  L ankle surgery; hypertension; history of falling; L knee pain.    Examination-Activity Limitations  Bathing;Bed Mobility;Locomotion Level;Stairs;Stand;Hygiene/Grooming;Bend;Caring for Others;Sleep;Lift;Transfers;Toileting;Squat;Carry    Examination-Participation Restrictions  Yard Work;Driving;Meal Prep;Laundry;Shop;Cleaning    Stability/Clinical Decision Making  Evolving/Moderate complexity    Rehab Potential  Good    PT Frequency  2x / week    PT Duration  12 weeks    PT Treatment/Interventions  ADLs/Self Care Home Management;Cryotherapy;Moist Heat;Gait training;Stair training;Functional mobility training;Therapeutic activities;Therapeutic exercise;Balance training;Neuromuscular re-education;Manual techniques;Joint Manipulations;Passive range of motion;Dry needling;DME Instruction    PT Next Visit Plan  Strengthening, ROM and muscle endurance, gait training when appropriate    PT Home Exercise Plan  Medbridge: [email or web address]    Consulted and Agree with Plan of Care  Patient       Patient will benefit from skilled  therapeutic intervention in order to improve the following deficits and impairments:  Abnormal gait, Decreased endurance, Decreased activity tolerance, Decreased range of motion, Decreased strength, Pain, Impaired flexibility, Difficulty walking, Decreased balance, Impaired perceived functional ability, Decreased knowledge of use of DME, Decreased mobility  Visit Diagnosis: Acute pain of left knee  Unsteadiness on feet  Muscle weakness (generalized)     Problem List Patient Active Problem List   Diagnosis Date Noted   Hypertension    Hyperlipidemia    Asthma    Tibial plateau fracture, left, closed, with nonunion, subsequent encounter 08/03/2019    Everlean Alstrom. Graylon Good, PT, DPT 11/28/19, 1:22 PM  [facility] [address], [place], [address] Phone: [phone]   Fax:  [phone]  Name: OSMEL DYKSTRA MRN: [id number] Date of Birth: 08-21-1945

## 2019-11-29 DIAGNOSIS — S82102K Unspecified fracture of upper end of left tibia, subsequent encounter for closed fracture with nonunion: Secondary | ICD-10-CM | POA: Diagnosis not present

## 2019-11-29 DIAGNOSIS — S82142D Displaced bicondylar fracture of left tibia, subsequent encounter for closed fracture with routine healing: Secondary | ICD-10-CM | POA: Diagnosis not present

## 2019-11-30 ENCOUNTER — Encounter: Payer: Self-pay | Admitting: Physical Therapy

## 2019-11-30 ENCOUNTER — Other Ambulatory Visit: Payer: Self-pay

## 2019-11-30 ENCOUNTER — Ambulatory Visit: Payer: PPO | Admitting: Physical Therapy

## 2019-11-30 DIAGNOSIS — M6281 Muscle weakness (generalized): Secondary | ICD-10-CM

## 2019-11-30 DIAGNOSIS — R2681 Unsteadiness on feet: Secondary | ICD-10-CM

## 2019-11-30 DIAGNOSIS — M25562 Pain in left knee: Secondary | ICD-10-CM | POA: Diagnosis not present

## 2019-11-30 NOTE — Therapy (Signed)
[facility] [address], [place], [address] Phone: [phone]   Fax:  [phone]  Physical Therapy Treatment  Patient Details  Name: Glenn Watson MRN: [id number] Date of Birth: Aug 20, 1945 Referring Provider (PT): Altamese Hawley, MD   Encounter Date: 11/30/2019  PT End of Session - 11/30/19 1137    Visit Number  22    Number of Visits  17    Date for PT Re-Evaluation  01/30/20    Authorization Type  [employer] reporting period from 11/09/2019    Authorization Time Period  N/A    Authorization - Visit Number  7    Authorization - Number of Visits  10    Progress Note Due on Visit  60    PT Start Time  1120    PT Stop Time  1200    PT Time Calculation (min)  40 min    Equipment Utilized During Treatment  Gait belt    Activity Tolerance  Patient tolerated treatment well;No increased pain;Patient limited by fatigue    Behavior During Therapy  [facility] for tasks assessed/performed       Past Medical History:  Diagnosis Date  . Anemia   . Asthma    as a child, exercise asthma - none since high school"  . Cancer ([place])    skin cancer - basal, squamous- Mylenoma - stage 2- chest  . Complication of anesthesia    "very sensitive to medications" "Had colonscopy with just Draminine"  . Family history of adverse reaction to anesthesia    daughter- N/V  . Hyperlipidemia   . Hypertension   . PONV (postoperative nausea and vomiting)     Past Surgical History:  Procedure Laterality Date  . APPENDECTOMY  2011   Dr. Bary Castilla  . COLONOSCOPY WITH PROPOFOL N/A 02/10/2017   Procedure: COLONOSCOPY WITH PROPOFOL;  Surgeon: Christene Lye, MD;  Location: [facility] ENDOSCOPY;  Service: Endoscopy;  Laterality: N/A;  . ORIF TIBIA PLATEAU Left 08/03/2019   Procedure: OPEN REDUCTION INTERNAL FIXATION (ORIF) TIBIAL PLATEAU;  Surgeon: Altamese [place], MD;  Location: [place];  Service: Orthopedics;  Laterality: Left;     There were no vitals filed for this visit.  Subjective Assessment - 11/30/19 1125    Subjective  Patient reports his hips were a bit sore following last session but the discomfort has gone away, possibly a bit more R compared to left. Still has the stretch feeling the posterior R knee. He has no pain today. He saw Dr. Marcelino Scot yesterday who "turned him loose" from his care but said he could continue PT. He has been asked to work a few afternoons in the future and may need to change some of his schedule.    Pertinent History  L ankle surgery; hypertension; history of falling; L knee pain.    Limitations  Lifting;Standing;Walking;House hold activities   bending/extending L knee, lifiting L LE, including ADLs, IADLs, household and community mobility, social activities, hobbies, running, stairs, hiking, etc   How long can you stand comfortably?  can do dishes    Diagnostic tests  CT scan L knee report 07/18/2019 "IMPRESSION: 1. 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 2. Partial healing of the impacted fracture of the proximal fibula. 3. Moderate knee joint effusion."    Currently in Pain?  No/denies       OBJECTIVE   TREATMENT: 11/29/2019 MD visit:released from MD care. Continue PT as desired/needed.  Therapeutic exercise:to centralize symptoms and improve ROM, strength, muscular endurance, and activity tolerance required for successful completion of functional activities.  - doorway pec stretch x 3x30 seconds to improve posture.  - bent over hip abduction/extension diagonal with 7# ankle weights 3x20 each side. Cuing for improved lateral excursion and 2-count hold.  - repeated lumbar extension in various positions for improved posture and decreased low back pain. x10 each position: wall sags, repeated extension in standing, repeated extension in standing with counter behind.   Neuromuscular Re-education:to improve, balance, postural strength, muscle  activation patterns, and stabilization strength required for functional activities: - runners step up to 8 inch step with 3 second hold for balance at top 3x10 each side with CGA for safety and positioned in hallway to have touchdown UE assist at both hands as needed. Pateint required 30-60 second breaks between sets and pauses to discuss an improved form. Needed cuing for upright posture, gentle step down, sequencing. Patient improved in form and balance significantly each set. Demonstrated decreased strength, control, balance with L supporting LE compared to right.  Pt required multimodal cuing for proper technique and to facilitate improved neuromuscular control, strength, range of motion, and functional ability resulting in improved performance and form.Supervision - CGA provided for safety.  HOME EXERCISE PROGRAM Access Code: [email or web address] URL: [email or web address] Date: 09/19/2019 Prepared by: Rosita Kea  Exercises Doorway Pec Stretch at 90 Degrees Abduction - 1-2 x daily - 3 reps - 30 seconds hold Diagonal Hip Extension - 1 x daily - 3 sets - 10 reps - 4 seconds hold Squat with Chair Touch - 1 x daily - 3 sets - 10 reps  PT Education - 11/30/19 1137    Education Details  exercise purpose/form. self management techniques.    Person(s) Educated  Patient    Methods  Explanation;Demonstration;Tactile cues;Verbal cues    Comprehension  Verbalized understanding;Returned demonstration;Verbal cues required;Tactile cues required;Need further instruction       PT Short Term Goals - 08/14/19 1328      PT SHORT TERM GOAL #1   Title  Be independent with initial home exercise program for self-management of symptoms.    Baseline  initial HEP provided at IE (04/18/2019);    Time  2    Period  Weeks    Status  Achieved    Target Date  05/02/19        PT Long Term Goals - 11/08/19 1354      PT LONG TERM GOAL #1   Title  Be independent with a long-term home exercise  program for self-management of symptoms.    Baseline  initial HEP provided at IE (04/18/2019); continues to participate in currently appropriate HEP (05/18/2019; 06/29/2019); updated HEP as appropriate for post-op rehab (08/14/2019); currently participating in appropriate HEP, has not yet received final long term HEP (08/24/2019; 09/26/2019; 10/24/2019; 11/07/2019);    Time  12    Period  Weeks    Status  Partially Met    Target Date  01/30/20      PT LONG TERM GOAL #2   Title  Demonstrate improved FOTO score by 10 units to demonstrate improvement in overall condition and self-reported functional ability.    Baseline  44 (04/18/2019); 52 (05/18/2019); 52 (06/29/2019); FOTO = 40 (08/14/2019); 51 (09/26/2019); 60 (10/24/2019); 63 (11/07/2019);    Time  12    Period  Weeks    Status  Achieved    Target Date  01/30/20      PT LONG TERM  GOAL #3   Title  Complete community, work and/or recreational activities without limitation due to current condition.    Baseline  Difficulites with walking, standing, shopping, driving, cooking and bathing, currently NWB L LE (04/18/2019); continues with similar difficulties but is now PWB25% and is able to drive, cross legs to don shoes (05/18/2019); improving but continues to have limitations and cannot perform activities that require single leg stance full weight bearing (06/29/2019); currently limited similarly to intial eval due to recent surgery 08/03/2019 to repair non-union fracture (08/14/2019); continues to have similar limitations (08/24/2019); is improving again but is not yet weight bearing that produces similar limitations, able to shower in 30 min independently now (09/26/2019); continues to improve but is still using an assistive device and WBAT (10/24/2019); continues to improve and is currently weaning from AD, improved abilty to ambulate, cook, complete ADLs, etc (11/07/2019);    Time  12    Period  Weeks    Status  Partially Met    Target Date  01/30/20      PT  LONG TERM GOAL #4   Title  Increase L knee flexion AROM to 140 degrees to be able to complete ADLs without limitation of knee flexion    Baseline  PROM 95 degrees of flexion with pain at end range and AROM 80 degrees(04/18/2019); AAROM 110 with end range pain (05/18/2019); PROM 0-126, AROM ext -25, AAROM flex 115 (06/29/2019); PROM lacking 10 degrees extension, up to 100 degrees flexion (with brace on) (08/14/2019); PROM extension lacking ~ 2 degrees, PROM flexion: 110 degrees (08/24/2019); PROM extension to 0 degrees, PROM flexion 122 degrees (09/26/2019); 0-116 AROM; PROM 0-125 (10/24/2019); 0-132 (11/07/2019);    Time  12    Period  Weeks    Status  Partially Met    Target Date  01/30/20      PT LONG TERM GOAL #5   Title  Pt will increase LE strength to equal or greater than 4+/5 MMT in order to demonstrate improvement in strength and function.    Baseline  See objectives on IE(04/18/2019); still 2/10 L knee extension (lacking last 22 degrees against gravity) (05/18/2019); continues to lack terminal knee extension in open change position, improved to at least 4/5 with discomfort within available range (06/29/2019); has decreased in since last session due to recent surgery and away from PT 2 weeks (08/14/2019); 25 degree extensor lag (08/24/2019); has 15 degree extensor lag with etim, MMT improving - see objective (09/26/2019); improving, extensor lag now 3 degrees in ASLR (10/24/2019); continues to improve - see objective (11/07/2019);    Time  12    Period  Weeks    Status  Partially Met    Target Date  01/30/20            Plan - 11/30/19 1809    Clinical Impression Statement  Patient tolerated treatment well and continues to make progress towards goals. Focused on hip and LE strengthening and balance today as well as improved posture and to address possibility of back pain referring to right lower leg. Pt did complain of an 'odd' feeling at his right lateral calf during step up exercise (went away  with rest). Patient would benefit from continued management of limiting condition by skilled physical therapist to address remaining impairments and functional limitations to work towards stated goals and return to PLOF or maximal functional independence.    Personal Factors and Comorbidities  Comorbidity 2;Age    Comorbidities  L ankle surgery; hypertension; history  of falling; L knee pain.    Examination-Activity Limitations  Bathing;Bed Mobility;Locomotion Level;Stairs;Stand;Hygiene/Grooming;Bend;Caring for Others;Sleep;Lift;Transfers;Toileting;Squat;Carry    Examination-Participation Restrictions  Yard Work;Driving;Meal Prep;Laundry;Shop;Cleaning    Stability/Clinical Decision Making  Evolving/Moderate complexity    Rehab Potential  Good    PT Frequency  2x / week    PT Duration  12 weeks    PT Treatment/Interventions  ADLs/Self Care Home Management;Cryotherapy;Moist Heat;Gait training;Stair training;Functional mobility training;Therapeutic activities;Therapeutic exercise;Balance training;Neuromuscular re-education;Manual techniques;Joint Manipulations;Passive range of motion;Dry needling;DME Instruction    PT Next Visit Plan  Strengthening, ROM and muscle endurance, gait training when appropriate    PT Home Exercise Plan  Medbridge: [email or web address]    Consulted and Agree with Plan of Care  Patient       Patient will benefit from skilled therapeutic intervention in order to improve the following deficits and impairments:  Abnormal gait, Decreased endurance, Decreased activity tolerance, Decreased range of motion, Decreased strength, Pain, Impaired flexibility, Difficulty walking, Decreased balance, Impaired perceived functional ability, Decreased knowledge of use of DME, Decreased mobility  Visit Diagnosis: Acute pain of left knee  Unsteadiness on feet  Muscle weakness (generalized)     Problem List Patient Active Problem List   Diagnosis Date Noted  . Hypertension   . Hyperlipidemia    . Asthma   . Tibial plateau fracture, left, closed, with nonunion, subsequent encounter 08/03/2019    Everlean Alstrom. Graylon Good, PT, DPT 11/30/19, 6:11 PM  [facility] [address] [place], [address] Phone: [phone]   Fax:  [phone]  Name: ZACK CRAGER MRN: [id number] Date of Birth: 1945/07/07

## 2019-12-05 ENCOUNTER — Other Ambulatory Visit: Payer: Self-pay

## 2019-12-05 ENCOUNTER — Ambulatory Visit: Payer: PPO | Admitting: Physical Therapy

## 2019-12-05 DIAGNOSIS — M6281 Muscle weakness (generalized): Secondary | ICD-10-CM

## 2019-12-05 DIAGNOSIS — M25562 Pain in left knee: Secondary | ICD-10-CM

## 2019-12-05 DIAGNOSIS — R2681 Unsteadiness on feet: Secondary | ICD-10-CM

## 2019-12-05 NOTE — Therapy (Signed)
[facility] [address] [place], [address] Phone: [phone]   Fax:  [phone]  Physical Therapy Treatment  Patient Details  Name: Glenn Watson MRN: [id number] Date of Birth: 10/17/1945 Referring Provider (PT): Altamese [place], MD   Encounter Date: 12/05/2019  PT End of Session - 12/06/19 0959    Visit Number  81    Number of Visits  54    Date for PT Re-Evaluation  01/30/20    Authorization Type  [employer] reporting period from 11/09/2019    Authorization Time Period  N/A    Authorization - Visit Number  8    Authorization - Number of Visits  10    Progress Note Due on Visit  80    PT Start Time  1300    PT Stop Time  1340    PT Time Calculation (min)  40 min    Equipment Utilized During Treatment  Gait belt    Activity Tolerance  Patient tolerated treatment well;No increased pain;Patient limited by fatigue    Behavior During Therapy  [facility] for tasks assessed/performed       Past Medical History:  Diagnosis Date  . Anemia   . Asthma    as a child, exercise asthma - none since high school"  . Cancer ([place])    skin cancer - basal, squamous- Mylenoma - stage 2- chest  . Complication of anesthesia    "very sensitive to medications" "Had colonscopy with just Draminine"  . Family history of adverse reaction to anesthesia    daughter- N/V  . Hyperlipidemia   . Hypertension   . PONV (postoperative nausea and vomiting)     Past Surgical History:  Procedure Laterality Date  . APPENDECTOMY  2011   Dr. Bary Castilla  . COLONOSCOPY WITH PROPOFOL N/A 02/10/2017   Procedure: COLONOSCOPY WITH PROPOFOL;  Surgeon: Christene Lye, MD;  Location: [facility];  Service: Endoscopy;  Laterality: N/A;  . ORIF TIBIA PLATEAU Left 08/03/2019   Procedure: OPEN REDUCTION INTERNAL FIXATION (ORIF) TIBIAL PLATEAU;  Surgeon: Altamese Odin, MD;  Location: [place];  Service: Orthopedics;  Laterality: Left;     There were no vitals filed for this visit.  Subjective Assessment - 12/05/19 1259    Subjective  Patient reports he was able to do single leg, leg press 60# 2x20 each side, and 120# 2-3x30 reps. He had no problems or pain. Dr. Marcelino Scot did take radiographs last wednesday and said they look great!    Pertinent History  L ankle surgery; hypertension; history of falling; L knee pain.    Limitations  Lifting;Standing;Walking;House hold activities   bending/extending L knee, lifiting L LE, including ADLs, IADLs, household and community mobility, social activities, hobbies, running, stairs, hiking, etc   How long can you stand comfortably?  can do dishes    Diagnostic tests  CT scan L knee report 07/18/2019 "IMPRESSION: 1. 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 2. Partial healing of the impacted fracture of the proximal fibula. 3. Moderate knee joint effusion."    Currently in Pain?  No/denies      OBJECTIVE  TREATMENT: 11/29/2019 MD visit:released from MD care. Continue PT as desired/needed.  Therapeutic exercise:to centralize symptoms and improve ROM, strength, muscular endurance, and activity tolerance required for successful completion of functional activities.  - doorway pec stretch x 3x30 seconds to improve posture.  - sit <> stand from plinth: 3x10 (24.5/23.5/22.5 inch height using  10# DB as counter weight), 1x10, 1x12 from 22.5 inch surface with no weight.  - runners step up to step with 1-3 second hold for balance at top 1x10 each side to 8 inch step, 2x10 to 12 inch step with CGA for safety and positioned in hallway to have touchdown UE assist at both hands as needed. Pateint required 30-60 second breaks between sets and pauses to discuss an improved form. Needed cuing for upright posture, gentle step down. Patient improved in form and balance significantly each set. Demonstrated decreased strength, control, balance with L supporting LE compared to  right.  Pt required multimodal cuing for proper technique and to facilitate improved neuromuscular control, strength, range of motion, and functional ability resulting in improved performance and form.Supervision - CGA provided for safety.  HOME EXERCISE PROGRAM Access Code: [email or web address] URL: [email or web address] Date: 09/19/2019 Prepared by: Rosita Kea  Exercises Doorway Pec Stretch at 90 Degrees Abduction - 1-2 x daily - 3 reps - 30 seconds hold Diagonal Hip Extension - 1 x daily - 3 sets - 10 reps - 4 seconds hold Squat with Chair Touch - 1 x daily - 3 sets - 10 reps   PT Education - 12/06/19 0959    Education Details  exercise purpose/form. self management techniques.    Person(s) Educated  Patient    Methods  Explanation;Demonstration;Tactile cues;Verbal cues    Comprehension  Verbalized understanding;Returned demonstration;Verbal cues required;Tactile cues required;Need further instruction       PT Short Term Goals - 08/14/19 1328      PT SHORT TERM GOAL #1   Title  Be independent with initial home exercise program for self-management of symptoms.    Baseline  initial HEP provided at IE (04/18/2019);    Time  2    Period  Weeks    Status  Achieved    Target Date  05/02/19        PT Long Term Goals - 11/08/19 1354      PT LONG TERM GOAL #1   Title  Be independent with a long-term home exercise program for self-management of symptoms.    Baseline  initial HEP provided at IE (04/18/2019); continues to participate in currently appropriate HEP (05/18/2019; 06/29/2019); updated HEP as appropriate for post-op rehab (08/14/2019); currently participating in appropriate HEP, has not yet received final long term HEP (08/24/2019; 09/26/2019; 10/24/2019; 11/07/2019);    Time  12    Period  Weeks    Status  Partially Met    Target Date  01/30/20      PT LONG TERM GOAL #2   Title  Demonstrate improved FOTO score by 10 units to demonstrate improvement in overall  condition and self-reported functional ability.    Baseline  44 (04/18/2019); 52 (05/18/2019); 52 (06/29/2019); FOTO = 40 (08/14/2019); 51 (09/26/2019); 60 (10/24/2019); 63 (11/07/2019);    Time  12    Period  Weeks    Status  Achieved    Target Date  01/30/20      PT LONG TERM GOAL #3   Title  Complete community, work and/or recreational activities without limitation due to current condition.    Baseline  Difficulites with walking, standing, shopping, driving, cooking and bathing, currently NWB L LE (04/18/2019); continues with similar difficulties but is now PWB25% and is able to drive, cross legs to don shoes (05/18/2019); improving but continues to have limitations and cannot perform activities that require single leg stance full weight bearing (06/29/2019); currently limited similarly to intial eval  due to recent surgery 08/03/2019 to repair non-union fracture (08/14/2019); continues to have similar limitations (08/24/2019); is improving again but is not yet weight bearing that produces similar limitations, able to shower in 30 min independently now (09/26/2019); continues to improve but is still using an assistive device and WBAT (10/24/2019); continues to improve and is currently weaning from AD, improved abilty to ambulate, cook, complete ADLs, etc (11/07/2019);    Time  12    Period  Weeks    Status  Partially Met    Target Date  01/30/20      PT LONG TERM GOAL #4   Title  Increase L knee flexion AROM to 140 degrees to be able to complete ADLs without limitation of knee flexion    Baseline  PROM 95 degrees of flexion with pain at end range and AROM 80 degrees(04/18/2019); AAROM 110 with end range pain (05/18/2019); PROM 0-126, AROM ext -25, AAROM flex 115 (06/29/2019); PROM lacking 10 degrees extension, up to 100 degrees flexion (with brace on) (08/14/2019); PROM extension lacking ~ 2 degrees, PROM flexion: 110 degrees (08/24/2019); PROM extension to 0 degrees, PROM flexion 122 degrees (09/26/2019);  0-116 AROM; PROM 0-125 (10/24/2019); 0-132 (11/07/2019);    Time  12    Period  Weeks    Status  Partially Met    Target Date  01/30/20      PT LONG TERM GOAL #5   Title  Pt will increase LE strength to equal or greater than 4+/5 MMT in order to demonstrate improvement in strength and function.    Baseline  See objectives on IE(04/18/2019); still 2/10 L knee extension (lacking last 22 degrees against gravity) (05/18/2019); continues to lack terminal knee extension in open change position, improved to at least 4/5 with discomfort within available range (06/29/2019); has decreased in since last session due to recent surgery and away from PT 2 weeks (08/14/2019); 25 degree extensor lag (08/24/2019); has 15 degree extensor lag with etim, MMT improving - see objective (09/26/2019); improving, extensor lag now 3 degrees in ASLR (10/24/2019); continues to improve - see objective (11/07/2019);    Time  12    Period  Weeks    Status  Partially Met    Target Date  01/30/20            Plan - 12/06/19 1002    Clinical Impression Statement  Patient tolerated treatment well overall and continues to show progress in LE strength, coordination, and balance. Still cannot get up from a chair without UE support but now able to stand from a 22.5 inch surface whereas previously a 26 inch surface was very difficult. Patient would benefit from continued management of limiting condition by skilled physical therapist to address remaining impairments and functional limitations to work towards stated goals and return to PLOF or maximal functional independence.    Personal Factors and Comorbidities  Comorbidity 2;Age    Comorbidities  L ankle surgery; hypertension; history of falling; L knee pain.    Examination-Activity Limitations  Bathing;Bed Mobility;Locomotion Level;Stairs;Stand;Hygiene/Grooming;Bend;Caring for Others;Sleep;Lift;Transfers;Toileting;Squat;Carry    Examination-Participation Restrictions  Yard  Work;Driving;Meal Prep;Laundry;Shop;Cleaning    Stability/Clinical Decision Making  Evolving/Moderate complexity    Rehab Potential  Good    PT Frequency  2x / week    PT Duration  12 weeks    PT Treatment/Interventions  ADLs/Self Care Home Management;Cryotherapy;Moist Heat;Gait training;Stair training;Functional mobility training;Therapeutic activities;Therapeutic exercise;Balance training;Neuromuscular re-education;Manual techniques;Joint Manipulations;Passive range of motion;Dry needling;DME Instruction    PT Next Visit Plan  Strengthening,  ROM and muscle endurance, gait training when appropriate    PT Home Exercise Plan  [employer]: [email or web address]    Consulted and Agree with Plan of Care  Patient       Patient will benefit from skilled therapeutic intervention in order to improve the following deficits and impairments:  Abnormal gait, Decreased endurance, Decreased activity tolerance, Decreased range of motion, Decreased strength, Pain, Impaired flexibility, Difficulty walking, Decreased balance, Impaired perceived functional ability, Decreased knowledge of use of DME, Decreased mobility  Visit Diagnosis: Acute pain of left knee  Unsteadiness on feet  Muscle weakness (generalized)     Problem List Patient Active Problem List   Diagnosis Date Noted  . Hypertension   . Hyperlipidemia   . Asthma   . Tibial plateau fracture, left, closed, with nonunion, subsequent encounter 08/03/2019    Everlean Alstrom. Graylon Good, PT, DPT 12/06/19, 10:03 AM  [facility] [address] [place], [address] Phone: [phone]   Fax:  [phone]  Name: Glenn Watson MRN: [id number] Date of Birth: [birth year]

## 2019-12-06 ENCOUNTER — Encounter: Payer: Self-pay | Admitting: Physical Therapy

## 2019-12-06 ENCOUNTER — Encounter: Payer: PPO | Admitting: Physical Therapy

## 2019-12-07 ENCOUNTER — Other Ambulatory Visit: Payer: Self-pay

## 2019-12-07 ENCOUNTER — Ambulatory Visit: Payer: PPO | Admitting: Physical Therapy

## 2019-12-07 ENCOUNTER — Encounter: Payer: Self-pay | Admitting: Physical Therapy

## 2019-12-07 DIAGNOSIS — R2681 Unsteadiness on feet: Secondary | ICD-10-CM

## 2019-12-07 DIAGNOSIS — H903 Sensorineural hearing loss, bilateral: Secondary | ICD-10-CM | POA: Diagnosis not present

## 2019-12-07 DIAGNOSIS — M25562 Pain in left knee: Secondary | ICD-10-CM | POA: Diagnosis not present

## 2019-12-07 DIAGNOSIS — M6281 Muscle weakness (generalized): Secondary | ICD-10-CM

## 2019-12-07 NOTE — Therapy (Signed)
[facility] [address], [place], [address] Phone: [phone]   Fax:  [phone]  Physical Therapy Treatment  Patient Details  Name: Glenn Watson MRN: [id number] Date of Birth: 02-18-1946 Referring Provider (PT): Altamese [place], MD   Encounter Date: 12/07/2019   PT End of Session - 12/07/19 1350    Visit Number 19    Number of Visits 1    Date for PT Re-Evaluation 01/30/20    Authorization Type [employer] reporting period from 11/09/2019    Authorization Time Period N/A    Authorization - Visit Number 9    Authorization - Number of Visits 10    Progress Note Due on Visit 61    PT Start Time 1330    PT Stop Time 1410    PT Time Calculation (min) 40 min    Equipment Utilized During Treatment Gait belt    Activity Tolerance Patient tolerated treatment well;No increased pain;Patient limited by fatigue    Behavior During Therapy [facility] for tasks assessed/performed           Past Medical History:  Diagnosis Date  . Anemia   . Asthma    as a child, exercise asthma - none since high school"  . Cancer ([place])    skin cancer - basal, squamous- Mylenoma - stage 2- chest  . Complication of anesthesia    "very sensitive to medications" "Had colonscopy with just Draminine"  . Family history of adverse reaction to anesthesia    daughter- N/V  . Hyperlipidemia   . Hypertension   . PONV (postoperative nausea and vomiting)     Past Surgical History:  Procedure Laterality Date  . APPENDECTOMY  2011   Dr. Bary Castilla  . COLONOSCOPY WITH PROPOFOL N/A 02/10/2017   Procedure: COLONOSCOPY WITH PROPOFOL;  Surgeon: Christene Lye, MD;  Location: [facility];  Service: Endoscopy;  Laterality: N/A;  . ORIF TIBIA PLATEAU Left 08/03/2019   Procedure: OPEN REDUCTION INTERNAL FIXATION (ORIF) TIBIAL PLATEAU;  Surgeon: Altamese Washburn, MD;  Location: [facility];  Service: Orthopedics;  Laterality: Left;     There were no vitals filed for this visit.   Subjective Assessment - 12/07/19 1336    Subjective Patient reports that he is "feeling good" at the beginniing of todays session.  He reports some mild R knee pain and believes it could be from the step-ups from last session.    Pertinent History L ankle surgery; hypertension; history of falling; L knee pain.    Limitations Lifting;Standing;Walking;House hold activities   bending/extending L knee, lifiting L LE, including ADLs, IADLs, household and community mobility, social activities, hobbies, running, stairs, hiking, etc   How long can you stand comfortably? can do dishes    Diagnostic tests CT scan L knee report 07/18/2019 "IMPRESSION: 1. 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 2. Partial healing of the impacted fracture of the proximal fibula. 3. Moderate knee joint effusion."    Currently in Pain? No/denies             OBJECTIVE  TREATMENT: 11/29/2019 MD visit:released from MD care. Continue PT as desired/needed.  Therapeutic exercise:to centralize symptoms and improve ROM, strength, muscular endurance, and activity tolerance required for successful completion of functional activities.  - doorway pec stretch x 3x30 seconds to improve posture. - wall sag with elbows on wall x 5 with 5-10 second hold.   total gym squat - bilateral legs level26(top of bar),1x20 -  Left leg only 3x10 at level 26 - Right leg only3x10 atlevel26 - bilateral legs max depth 1x 20 at level 15 - Single leg max depth, 3x10-12 each side at level 15  - reverse lunge x 10 each side with airex under back knee as target.   Pt required multimodal cuing for proper technique and to facilitate improved neuromuscular control, strength, range of motion, and functional ability resulting in improved performance and form.Supervision - CGA provided for safety.  HOME EXERCISE PROGRAM Access Code: [email or web address] URL:  [email or web address] Date: 09/19/2019 Prepared by: Rosita Kea  Exercises Doorway Pec Stretch at 90 Degrees Abduction - 1-2 x daily - 3 reps - 30 seconds hold Diagonal Hip Extension - 1 x daily - 3 sets - 10 reps - 4 seconds hold Squat with Chair Touch - 1 x daily - 3 sets - 10 reps    PT Education - 12/07/19 1350    Education Details exercise purpose/form. self management techniques    Person(s) Educated Patient    Methods Explanation;Demonstration;Tactile cues;Verbal cues    Comprehension Verbalized understanding;Returned demonstration;Verbal cues required;Tactile cues required;Need further instruction            PT Short Term Goals - 08/14/19 1328      PT SHORT TERM GOAL #1   Title Be independent with initial home exercise program for self-management of symptoms.    Baseline initial HEP provided at IE (04/18/2019);    Time 2    Period Weeks    Status Achieved    Target Date 05/02/19             PT Long Term Goals - 11/08/19 1354      PT LONG TERM GOAL #1   Title Be independent with a long-term home exercise program for self-management of symptoms.    Baseline initial HEP provided at IE (04/18/2019); continues to participate in currently appropriate HEP (05/18/2019; 06/29/2019); updated HEP as appropriate for post-op rehab (08/14/2019); currently participating in appropriate HEP, has not yet received final long term HEP (08/24/2019; 09/26/2019; 10/24/2019; 11/07/2019);    Time 12    Period Weeks    Status Partially Met    Target Date 01/30/20      PT LONG TERM GOAL #2   Title Demonstrate improved FOTO score by 10 units to demonstrate improvement in overall condition and self-reported functional ability.    Baseline 44 (04/18/2019); 52 (05/18/2019); 52 (06/29/2019); FOTO = 40 (08/14/2019); 51 (09/26/2019); 60 (10/24/2019); 63 (11/07/2019);    Time 12    Period Weeks    Status Achieved    Target Date 01/30/20      PT LONG TERM GOAL #3   Title Complete  community, work and/or recreational activities without limitation due to current condition.    Baseline Difficulites with walking, standing, shopping, driving, cooking and bathing, currently NWB L LE (04/18/2019); continues with similar difficulties but is now PWB25% and is able to drive, cross legs to don shoes (05/18/2019); improving but continues to have limitations and cannot perform activities that require single leg stance full weight bearing (06/29/2019); currently limited similarly to intial eval due to recent surgery 08/03/2019 to repair non-union fracture (08/14/2019); continues to have similar limitations (08/24/2019); is improving again but is not yet weight bearing that produces similar limitations, able to shower in 30 min independently now (09/26/2019); continues to improve but is still using an assistive device and WBAT (10/24/2019); continues to improve and is currently weaning from AD, improved abilty to ambulate, cook, complete  ADLs, etc (11/07/2019);    Time 12    Period Weeks    Status Partially Met    Target Date 01/30/20      PT LONG TERM GOAL #4   Title Increase L knee flexion AROM to 140 degrees to be able to complete ADLs without limitation of knee flexion    Baseline PROM 95 degrees of flexion with pain at end range and AROM 80 degrees(04/18/2019); AAROM 110 with end range pain (05/18/2019); PROM 0-126, AROM ext -25, AAROM flex 115 (06/29/2019); PROM lacking 10 degrees extension, up to 100 degrees flexion (with brace on) (08/14/2019); PROM extension lacking ~ 2 degrees, PROM flexion: 110 degrees (08/24/2019); PROM extension to 0 degrees, PROM flexion 122 degrees (09/26/2019); 0-116 AROM; PROM 0-125 (10/24/2019); 0-132 (11/07/2019);    Time 12    Period Weeks    Status Partially Met    Target Date 01/30/20      PT LONG TERM GOAL #5   Title Pt will increase LE strength to equal or greater than 4+/5 MMT in order to demonstrate improvement in strength and function.    Baseline See  objectives on IE(04/18/2019); still 2/10 L knee extension (lacking last 22 degrees against gravity) (05/18/2019); continues to lack terminal knee extension in open change position, improved to at least 4/5 with discomfort within available range (06/29/2019); has decreased in since last session due to recent surgery and away from PT 2 weeks (08/14/2019); 25 degree extensor lag (08/24/2019); has 15 degree extensor lag with etim, MMT improving - see objective (09/26/2019); improving, extensor lag now 3 degrees in ASLR (10/24/2019); continues to improve - see objective (11/07/2019);    Time 12    Period Weeks    Status Partially Met    Target Date 01/30/20                 Plan - 12/08/19 1226    Clinical Impression Statement Patient tolerated treatment well overall and continues to make excellent progress towards goals. Was able to return to reverse lunge with improved form, strength, and confidence. Finds left LE back more difficult than R LE back and tends to have excessive forward lean. Able to progress to touching airex with back leg. Briefly complained of discomfort at proximal lateral R tibia during total gym SLS squat but improved with cuing to reduce knee valgus and direct force through the heel. Patient with improved understanding of pushing through heel this session and reported it made a big difference in how the exercise felt. Patient would benefit from continued management of limiting condition by skilled physical therapist to address remaining impairments and functional limitations to work towards stated goals and return to PLOF or maximal functional independence.    Personal Factors and Comorbidities Comorbidity 2;Age    Comorbidities L ankle surgery; hypertension; history of falling; L knee pain.    Examination-Activity Limitations Bathing;Bed Mobility;Locomotion Level;Stairs;Stand;Hygiene/Grooming;Bend;Caring for Others;Sleep;Lift;Transfers;Toileting;Squat;Carry     Examination-Participation Restrictions Yard Work;Driving;Meal Prep;Laundry;Shop;Cleaning    Stability/Clinical Decision Making Evolving/Moderate complexity    Rehab Potential Good    PT Frequency 2x / week    PT Duration 12 weeks    PT Treatment/Interventions ADLs/Self Care Home Management;Cryotherapy;Moist Heat;Gait training;Stair training;Functional mobility training;Therapeutic activities;Therapeutic exercise;Balance training;Neuromuscular re-education;Manual techniques;Joint Manipulations;Passive range of motion;Dry needling;DME Instruction    PT Next Visit Plan Strengthening, ROM and muscle endurance, gait training when appropriate    PT [place]: [email or web address]    Consulted and Agree with Plan of Care Patient  Patient will benefit from skilled therapeutic intervention in order to improve the following deficits and impairments:  Abnormal gait, Decreased endurance, Decreased activity tolerance, Decreased range of motion, Decreased strength, Pain, Impaired flexibility, Difficulty walking, Decreased balance, Impaired perceived functional ability, Decreased knowledge of use of DME, Decreased mobility  Visit Diagnosis: Acute pain of left knee  Unsteadiness on feet  Muscle weakness (generalized)     Problem List Patient Active Problem List   Diagnosis Date Noted  . Hypertension   . Hyperlipidemia   . Asthma   . Tibial plateau fracture, left, closed, with nonunion, subsequent encounter 08/03/2019   Enrigue Catena, SPT, student physical therapist assisted under direct supervision of licensed physical therapists during the entirety of the session.   Everlean Alstrom. Graylon Good, PT, DPT 12/08/19, 12:27 PM  [facility] [address], [place], [address] Phone: [phone]   Fax:  [phone]  Name: SEDRICK TOBER MRN: [id number] Date of Birth: 1946-02-14

## 2019-12-11 ENCOUNTER — Encounter: Payer: PPO | Admitting: Physical Therapy

## 2019-12-12 ENCOUNTER — Ambulatory Visit: Payer: PPO | Admitting: Physical Therapy

## 2019-12-12 ENCOUNTER — Encounter: Payer: Self-pay | Admitting: Physical Therapy

## 2019-12-12 ENCOUNTER — Other Ambulatory Visit: Payer: Self-pay

## 2019-12-12 DIAGNOSIS — M25562 Pain in left knee: Secondary | ICD-10-CM | POA: Diagnosis not present

## 2019-12-12 DIAGNOSIS — R2681 Unsteadiness on feet: Secondary | ICD-10-CM

## 2019-12-12 DIAGNOSIS — M6281 Muscle weakness (generalized): Secondary | ICD-10-CM

## 2019-12-12 NOTE — Therapy (Signed)
[facility] [address] [place], [address] Phone: [phone]   Fax:  [phone]  Physical Therapy Treatment / Progress Note Reporting period: 11/09/2019 - 12/12/2019  Patient Details  Name: Glenn Watson MRN: [id number] Date of Birth: 1946-02-07 Referring Provider (PT): Altamese [place], MD   Encounter Date: 12/12/2019   PT End of Session - 12/12/19 1443    Visit Number 60    Number of Visits 22    Date for PT Re-Evaluation 01/30/20    Authorization Type [employer] reporting period from 11/09/2019    Authorization Time Period N/A    Authorization - Visit Number 10    Authorization - Number of Visits 10    Progress Note Due on Visit 60   Most recent FOTO = 34 (12/12/2019)   PT Start Time 1300    PT Stop Time 1340    PT Time Calculation (min) 40 min    Equipment Utilized During Treatment Gait belt    Activity Tolerance Patient tolerated treatment well;No increased pain;Patient limited by fatigue    Behavior During Therapy [facility] for tasks assessed/performed           Past Medical History:  Diagnosis Date  . Anemia   . Asthma    as a child, exercise asthma - none since high school"  . Cancer ([place])    skin cancer - basal, squamous- Mylenoma - stage 2- chest  . Complication of anesthesia    "very sensitive to medications" "Had colonscopy with just Draminine"  . Family history of adverse reaction to anesthesia    daughter- N/V  . Hyperlipidemia   . Hypertension   . PONV (postoperative nausea and vomiting)     Past Surgical History:  Procedure Laterality Date  . APPENDECTOMY  2011   Dr. Bary Castilla  . COLONOSCOPY WITH PROPOFOL N/A 02/10/2017   Procedure: COLONOSCOPY WITH PROPOFOL;  Surgeon: Christene Lye, MD;  Location: [facility];  Service: Endoscopy;  Laterality: N/A;  . ORIF TIBIA PLATEAU Left 08/03/2019   Procedure: OPEN REDUCTION INTERNAL FIXATION (ORIF) TIBIAL PLATEAU;   Surgeon: Altamese Kismet, MD;  Location: [facility];  Service: Orthopedics;  Laterality: Left;    There were no vitals filed for this visit.   Subjective Assessment - 12/12/19 1259    Subjective Patient reports he is feeling well. Was very tired following last treatment session after the lunges. No pain following last or today. He walked at least a half mile and probably closer to 1/2 mile and had no problems. He had to sit down once about half way through. He also did 3x20 reps of double leg press at 160 lbs but then could only do one set of 15 for single leg. The day after he walked he had to use a crutch and cane for 5 min due to pain at the anterior L knee but it cleared up and he was able to walk again without an AD.    Pertinent History L ankle surgery; hypertension; history of falling; L knee pain.    Limitations Lifting;Standing;Walking;House hold activities   must use hands to get up from standard chair, limits walking at times, some of his gym exercises, is still avoiding walking on unstable surfaces, curbs, hiking. Is planning to go back to work for a few hours at a time soon.   How long can you stand comfortably? standing and walking without sitting for over 1 hour    How long  can you walk comfortably? 0.5 - 0.75 miles    Diagnostic tests CT scan L knee report 07/18/2019 "IMPRESSION: 1. 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 2. Partial healing of the impacted fracture of the proximal fibula. 3. Moderate knee joint effusion."    Patient Stated Goals Able to walk without AD    Currently in Pain? No/denies          OBJECTIVE:  FUNCTIONAL OUTCOME MEASURE FOTO =67(12/12/2019)   PROM Knee  Flexion: L [id number]*  Extension:0 degrees *indicates pain   MUSCLE PERFORMANCE (MMT) R/L 5/5Hip flexion 5/4Hip abduction 4+/4+ Hip adduction 5/5Hip extension(knee extended), tightness noted  5/5Knee extension 5/5Knee  flexion 5/5Ankle Dorsiflexion 5/5Ankle Plantarflexion *indicates pain Ankle eversion and great toe extension WFL bilaterally Able to heel walk and toe walk with good ankle strength B  FUNCTIONAL TESTS:  Able to sit <> stand from 20 inch surface with hands crossed over chest. 5TSTS in 18.5 seconds (controlling lower). Mild deviation of stance towards R LE.   Standing left step up to 12 inch step 2x10 with touchdown support L UE. Very difficult but able.   Stairs: able to ascend and descend four 6-inch steps with reciprocal gait and BUE support. Has some difficulty controlling descent off of 8 inch step leading with R LE down.   Able to complete back lunge with BUE support in front with great effort tapping knee to airex by end of set of 10. Excessive forward lean with L LE back. Fair form otherwise with extensive cuing.    TREATMENT: 11/29/2019 MD visit:released from MD care. Continue PT as desired/needed.  Therapeutic exercise:to centralize symptoms and improve ROM, strength, muscular endurance, and activity tolerance required for successful completion of functional activities.  - doorway pec stretch x 3x30 seconds to improve posture. - wall sag with elbows on wall x 5 with 5-10 second hold.   - Measurements to assess progress (includes MMT, PROM, 5TSTS; see above).  - sit <> stand from plinth with hands over chest controlling movement: x5-6 each from 22 inches, 21 inches, and 20 inches (very difficult). Then 5TSTS test from 20 inches. Mild deviation of stance towards R side. - hamstring curl at omega machine, B LE at 45# 1x20. Effort but not painful.  - standing R heel taps at stairs, forward 2x10, sideways with improved hip hinge 2x10. To varying surfaces but able to complete at 4 inches reliably with heel tap.   Pt required multimodal cuing for proper technique and to facilitate improved neuromuscular control, strength, range of motion, and functional ability resulting in  improved performance and form.Supervision - CGA provided for safety.  HOME EXERCISE PROGRAM Access Code: [email or web address] URL: [email or web address] Date: 09/19/2019 Prepared by: Rosita Kea  Exercises Doorway Pec Stretch at 90 Degrees Abduction - 1-2 x daily - 3 reps - 30 seconds hold Diagonal Hip Extension - 1 x daily - 3 sets - 10 reps - 4 seconds hold Squat with Chair Touch - 1 x daily - 3 sets - 10 reps     PT Education - 12/12/19 1443    Education Details exercise purpose/form. self management techniques    Person(s) Educated Patient    Methods Explanation;Demonstration;Tactile cues;Verbal cues    Comprehension Verbalized understanding;Returned demonstration;Tactile cues required;Verbal cues required;Need further instruction            PT Short Term Goals - 08/14/19 1328      PT SHORT TERM GOAL #1   Title Be independent with initial  home exercise program for self-management of symptoms.    Baseline initial HEP provided at IE (04/18/2019);    Time 2    Period Weeks    Status Achieved    Target Date 05/02/19             PT Long Term Goals - 12/12/19 1453      PT LONG TERM GOAL #1   Title Be independent with a long-term home exercise program for self-management of symptoms.    Baseline initial HEP provided at IE (04/18/2019); continues to participate in currently appropriate HEP (05/18/2019; 06/29/2019); updated HEP as appropriate for post-op rehab (08/14/2019); currently participating in appropriate HEP, has not yet received final long term HEP (08/24/2019; 09/26/2019; 10/24/2019; 11/07/2019; 12/12/2019);    Time 12    Period Weeks    Status Partially Met    Target Date 01/30/20      PT LONG TERM GOAL #2   Title Demonstrate improved FOTO score by 10 units to demonstrate improvement in overall condition and self-reported functional ability.    Baseline 44 (04/18/2019); 52 (05/18/2019); 52 (06/29/2019); FOTO = 40 (08/14/2019); 51 (09/26/2019); 60  (10/24/2019); 63 (11/07/2019); 67 (12/12/2019);    Time 12    Period Weeks    Status Achieved    Target Date 01/30/20      PT LONG TERM GOAL #3   Title Complete community, work and/or recreational activities without limitation due to current condition.    Baseline Difficulites with walking, standing, shopping, driving, cooking and bathing, currently NWB L LE (04/18/2019); continues with similar difficulties but is now PWB25% and is able to drive, cross legs to don shoes (05/18/2019); improving but continues to have limitations and cannot perform activities that require single leg stance full weight bearing (06/29/2019); currently limited similarly to intial eval due to recent surgery 08/03/2019 to repair non-union fracture (08/14/2019); continues to have similar limitations (08/24/2019); is improving again but is not yet weight bearing that produces similar limitations, able to shower in 30 min independently now (09/26/2019); continues to improve but is still using an assistive device and WBAT (10/24/2019); continues to improve and is currently weaning from AD, improved abilty to ambulate, cook, complete ADLs, etc (11/07/2019); making excellent progress - has not yet returned to hiking, not yet able to get up from chair without, long walks over uneven ground (12/12/2019);    Time 12    Period Weeks    Status Partially Met    Target Date 01/30/20      PT LONG TERM GOAL #4   Title Increase L knee flexion AROM to 140 degrees to be able to complete ADLs without limitation of knee flexion    Baseline PROM 95 degrees of flexion with pain at end range and AROM 80 degrees(04/18/2019); AAROM 110 with end range pain (05/18/2019); PROM 0-126, AROM ext -25, AAROM flex 115 (06/29/2019); PROM lacking 10 degrees extension, up to 100 degrees flexion (with brace on) (08/14/2019); PROM extension lacking ~ 2 degrees, PROM flexion: 110 degrees (08/24/2019); PROM extension to 0 degrees, PROM flexion 122 degrees (09/26/2019); 0-116  AROM; PROM 0-125 (10/24/2019); 0-132 (11/07/2019); PROM 0-125 (12/12/2019);    Time 12    Period Weeks    Status Partially Met    Target Date 01/30/20      PT LONG TERM GOAL #5   Title Pt will increase LE strength to equal or greater than 4+/5 MMT in order to demonstrate improvement in strength and function.    Baseline See objectives  on IE(04/18/2019); still 2/10 L knee extension (lacking last 22 degrees against gravity) (05/18/2019); continues to lack terminal knee extension in open change position, improved to at least 4/5 with discomfort within available range (06/29/2019); has decreased in since last session due to recent surgery and away from PT 2 weeks (08/14/2019); 25 degree extensor lag (08/24/2019); has 15 degree extensor lag with etim, MMT improving - see objective (09/26/2019); improving, extensor lag now 3 degrees in ASLR (10/24/2019); continues to improve - see objective (11/07/2019); met except L hip abduction which is 4/5 (12/12/2019);    Time 12    Period Weeks    Status Partially Met    Target Date 01/30/20                 Plan - 12/12/19 1453    Clinical Impression Statement Patient has attended 60 physical therapy sessions this episode of care and has made excellent progress over the last 10 visits. He demonstrates improvements in strength, function, balance and FOTO score. He continues to lack some strength in the left hip abductor, is not able to stand from a standard chair without use of BUEs, and has not yet returned to long walks or walking on uneven ground and hiking like he was used to doing prior to his injury. Patient is nearing readiness for discharge but would benefit from continued skilled physical therapy to restore functional abilities to level of PLOF. Patient would benefit from continued management of limiting condition by skilled physical therapist to address remaining impairments and functional limitations to work towards stated goals and return to PLOF or maximal  functional independence.    Personal Factors and Comorbidities Comorbidity 2;Age    Comorbidities L ankle surgery; hypertension; history of falling; L knee pain.    Examination-Activity Limitations Bathing;Bed Mobility;Locomotion Level;Stairs;Stand;Hygiene/Grooming;Bend;Caring for Others;Sleep;Lift;Transfers;Toileting;Squat;Carry    Examination-Participation Restrictions Yard Work;Driving;Meal Prep;Laundry;Shop;Cleaning    Stability/Clinical Decision Making Evolving/Moderate complexity    Rehab Potential Good    PT Frequency 2x / week    PT Duration 12 weeks    PT Treatment/Interventions ADLs/Self Care Home Management;Cryotherapy;Moist Heat;Gait training;Stair training;Functional mobility training;Therapeutic activities;Therapeutic exercise;Balance training;Neuromuscular re-education;Manual techniques;Joint Manipulations;Passive range of motion;Dry needling;DME Instruction    PT Next Visit Plan Strengthening, ROM and muscle endurance, gait training when appropriate    PT Home Exercise Plan Medbridge: [email or web address]    Consulted and Agree with Plan of Care Patient           Patient will benefit from skilled therapeutic intervention in order to improve the following deficits and impairments:  Abnormal gait, Decreased endurance, Decreased activity tolerance, Decreased range of motion, Decreased strength, Pain, Impaired flexibility, Difficulty walking, Decreased balance, Impaired perceived functional ability, Decreased knowledge of use of DME, Decreased mobility  Visit Diagnosis: Acute pain of left knee  Unsteadiness on feet  Muscle weakness (generalized)     Problem List Patient Active Problem List   Diagnosis Date Noted  . Hypertension   . Hyperlipidemia   . Asthma   . Tibial plateau fracture, left, closed, with nonunion, subsequent encounter 08/03/2019    Everlean Alstrom. Graylon Good, PT, DPT 12/12/19, 2:56 PM  [facility] [address], [place], [address] Phone: [phone]   Fax:  [phone]  Name: SHAMIR TUZZOLINO MRN: [id number] Date of Birth: 1946-05-09

## 2019-12-18 ENCOUNTER — Encounter: Payer: PPO | Admitting: Physical Therapy

## 2019-12-20 ENCOUNTER — Encounter: Payer: PPO | Admitting: Physical Therapy

## 2019-12-25 ENCOUNTER — Ambulatory Visit: Payer: PPO | Admitting: Physical Therapy

## 2019-12-25 ENCOUNTER — Other Ambulatory Visit: Payer: Self-pay

## 2019-12-25 DIAGNOSIS — M25562 Pain in left knee: Secondary | ICD-10-CM | POA: Diagnosis not present

## 2019-12-25 DIAGNOSIS — M6281 Muscle weakness (generalized): Secondary | ICD-10-CM

## 2019-12-25 DIAGNOSIS — R2681 Unsteadiness on feet: Secondary | ICD-10-CM

## 2019-12-25 NOTE — Therapy (Signed)
[facility] [address] [place], [address] Phone: [phone]   Fax:  [phone]  Physical Therapy Treatment  Patient Details  Name: Glenn Watson MRN: [id number] Date of Birth: [birth year] Referring Provider (PT): Altamese Old Tappan, MD   Encounter Date: 12/25/2019   PT End of Session - 12/25/19 1457    Visit Number 61    Number of Visits 5    Date for PT Re-Evaluation 01/30/20    Authorization Type [employer] reporting period from 12/25/2019    Authorization Time Period N/A    Authorization - Visit Number 1    Authorization - Number of Visits 10    Progress Note Due on Visit 66   Most recent FOTO = 91 (12/12/2019)   PT Start Time 1350    PT Stop Time 1435    PT Time Calculation (min) 45 min    Equipment Utilized During Treatment Gait belt    Activity Tolerance Patient tolerated treatment well;No increased pain;Patient limited by fatigue    Behavior During Therapy [facility] for tasks assessed/performed           Past Medical History:  Diagnosis Date  . Anemia   . Asthma    as a child, exercise asthma - none since high school"  . Cancer ([place])    skin cancer - basal, squamous- Mylenoma - stage 2- chest  . Complication of anesthesia    "very sensitive to medications" "Had colonscopy with just Draminine"  . Family history of adverse reaction to anesthesia    daughter- N/V  . Hyperlipidemia   . Hypertension   . PONV (postoperative nausea and vomiting)     Past Surgical History:  Procedure Laterality Date  . APPENDECTOMY  2011   Dr. Bary Castilla  . COLONOSCOPY WITH PROPOFOL N/A 02/10/2017   Procedure: COLONOSCOPY WITH PROPOFOL;  Surgeon: Christene Lye, MD;  Location: [facility] ENDOSCOPY;  Service: Endoscopy;  Laterality: N/A;  . ORIF TIBIA PLATEAU Left 08/03/2019   Procedure: OPEN REDUCTION INTERNAL FIXATION (ORIF) TIBIAL PLATEAU;  Surgeon: Altamese , MD;  Location: [facility];  Service:  Orthopedics;  Laterality: Left;    There were no vitals filed for this visit.   Subjective Assessment - 12/25/19 1350    Subjective Patient reports he is feeling well. He was able to work successfully twice last week. He was able to stand 1-3 hours at work before needing to sit down for 5-10 minutes. Went to the gym twice. Got pretty significant DOMS in hamstrings after the hamstring machine, but no problems with his injury site. No pain upon arrival today.    Pertinent History L ankle surgery; hypertension; history of falling; L knee pain.    Limitations Lifting;Standing;Walking;House hold activities   must use hands to get up from standard chair, limits walking at times, some of his gym exercises, is still avoiding walking on unstable surfaces, curbs, hiking. Is planning to go back to work for a few hours at a time soon.   How long can you stand comfortably? standing and walking without sitting for over 1 hour    How long can you walk comfortably? 0.5 - 0.75 miles    Diagnostic tests CT scan L knee report 07/18/2019 "IMPRESSION: 1. 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 2. Partial healing of the impacted fracture of the proximal fibula. 3. Moderate knee joint effusion."    Patient Stated Goals Able to walk without AD  Currently in Pain? No/denies            OBJECTIVE  TREATMENT: 11/29/2019 MD visit:released from MD care. Continue PT as desired/needed.  Therapeutic exercise:to centralize symptoms and improve ROM, strength, muscular endurance, and activity tolerance required for successful completion of functional activities.  - doorway pec stretch x 3x30 seconds to improve posture. - wall sag with elbows on wall x 3x30 second hold.  - sit <> stand from 18 inch chair progressing to with 15# DB held in both hands as a counter weight up to x7 reps. Then added airex pad on chair and progressed to sit <> stand with no counter weight (able to stand  pretty easily from this height). Completed x 10 with no counterweight.  - reverse lunge 2x 10 each side with airex under back knee as target.  - closed chain knee flexion stretch and strengthening with one leg on 2nd stair and BUE support as needed. 2x 10 each side attempting to use leg to support body into deeper and deeper lunge on the stair.   Neuromuscular Re-education: to improve, balance, postural strength, muscle activation patterns, and stabilization strength required for functional activities: - SLS ball toss with small green ball 10-15 tosses each leg with contralateral toe touch down as needed. (able to toss up to 4x while standing on R LE, up to 3 times less reliably while standing on L LE).  - soccer kick (stop ball and kick back), x 20 alternating feet periodically with ball kicked further and further laterally away form base of support to improve dynamic balance.   Pt required multimodal cuing for proper technique and to facilitate improved neuromuscular control, strength, range of motion, and functional ability resulting in improved performance and form.Supervision - CGA provided for safety.  HOME EXERCISE PROGRAM Access Code: [email or web address] URL: [email or web address] Date: 09/19/2019 Prepared by: Rosita Kea  Exercises Doorway Pec Stretch at 90 Degrees Abduction - 1-2 x daily - 3 reps - 30 seconds hold Diagonal Hip Extension - 1 x daily - 3 sets - 10 reps - 4 seconds hold Squat with Chair Touch - 1 x daily - 3 sets - 10 reps     PT Education - 12/25/19 1356    Education Details exercise purpose/form. self management techniques    Person(s) Educated Patient    Methods Explanation;Demonstration;Tactile cues;Verbal cues    Comprehension Verbalized understanding;Returned demonstration;Verbal cues required;Tactile cues required;Need further instruction            PT Short Term Goals - 08/14/19 1328      PT SHORT TERM GOAL #1   Title Be independent with  initial home exercise program for self-management of symptoms.    Baseline initial HEP provided at IE (04/18/2019);    Time 2    Period Weeks    Status Achieved    Target Date 05/02/19             PT Long Term Goals - 12/12/19 1453      PT LONG TERM GOAL #1   Title Be independent with a long-term home exercise program for self-management of symptoms.    Baseline initial HEP provided at IE (04/18/2019); continues to participate in currently appropriate HEP (05/18/2019; 06/29/2019); updated HEP as appropriate for post-op rehab (08/14/2019); currently participating in appropriate HEP, has not yet received final long term HEP (08/24/2019; 09/26/2019; 10/24/2019; 11/07/2019; 12/12/2019);    Time 12    Period Weeks    Status Partially Met    Target Date  01/30/20      PT LONG TERM GOAL #2   Title Demonstrate improved FOTO score by 10 units to demonstrate improvement in overall condition and self-reported functional ability.    Baseline 44 (04/18/2019); 52 (05/18/2019); 52 (06/29/2019); FOTO = 40 (08/14/2019); 51 (09/26/2019); 60 (10/24/2019); 63 (11/07/2019); 67 (12/12/2019);    Time 12    Period Weeks    Status Achieved    Target Date 01/30/20      PT LONG TERM GOAL #3   Title Complete community, work and/or recreational activities without limitation due to current condition.    Baseline Difficulites with walking, standing, shopping, driving, cooking and bathing, currently NWB L LE (04/18/2019); continues with similar difficulties but is now PWB25% and is able to drive, cross legs to don shoes (05/18/2019); improving but continues to have limitations and cannot perform activities that require single leg stance full weight bearing (06/29/2019); currently limited similarly to intial eval due to recent surgery 08/03/2019 to repair non-union fracture (08/14/2019); continues to have similar limitations (08/24/2019); is improving again but is not yet weight bearing that produces similar limitations, able to  shower in 30 min independently now (09/26/2019); continues to improve but is still using an assistive device and WBAT (10/24/2019); continues to improve and is currently weaning from AD, improved abilty to ambulate, cook, complete ADLs, etc (11/07/2019); making excellent progress - has not yet returned to hiking, not yet able to get up from chair without, long walks over uneven ground (12/12/2019);    Time 12    Period Weeks    Status Partially Met    Target Date 01/30/20      PT LONG TERM GOAL #4   Title Increase L knee flexion AROM to 140 degrees to be able to complete ADLs without limitation of knee flexion    Baseline PROM 95 degrees of flexion with pain at end range and AROM 80 degrees(04/18/2019); AAROM 110 with end range pain (05/18/2019); PROM 0-126, AROM ext -25, AAROM flex 115 (06/29/2019); PROM lacking 10 degrees extension, up to 100 degrees flexion (with brace on) (08/14/2019); PROM extension lacking ~ 2 degrees, PROM flexion: 110 degrees (08/24/2019); PROM extension to 0 degrees, PROM flexion 122 degrees (09/26/2019); 0-116 AROM; PROM 0-125 (10/24/2019); 0-132 (11/07/2019); PROM 0-125 (12/12/2019);    Time 12    Period Weeks    Status Partially Met    Target Date 01/30/20      PT LONG TERM GOAL #5   Title Pt will increase LE strength to equal or greater than 4+/5 MMT in order to demonstrate improvement in strength and function.    Baseline See objectives on IE(04/18/2019); still 2/10 L knee extension (lacking last 22 degrees against gravity) (05/18/2019); continues to lack terminal knee extension in open change position, improved to at least 4/5 with discomfort within available range (06/29/2019); has decreased in since last session due to recent surgery and away from PT 2 weeks (08/14/2019); 25 degree extensor lag (08/24/2019); has 15 degree extensor lag with etim, MMT improving - see objective (09/26/2019); improving, extensor lag now 3 degrees in ASLR (10/24/2019); continues to improve - see  objective (11/07/2019); met except L hip abduction which is 4/5 (12/12/2019);    Time 12    Period Weeks    Status Partially Met    Target Date 01/30/20                 Plan - 12/25/19 1510    Clinical Impression Statement Patient tolerated treatment well overall  with no increase in pain. Was able to progress to more reverse lunges with improved confidence and form. Continued to address balance and LE functional strength to work towards improved sit <> stand without using hands from chair. Patient fatigued with exercise and required multiple seated breaks. Patient would benefit from continued management of limiting condition by skilled physical therapist to address remaining impairments and functional limitations to work towards stated goals and return to PLOF or maximal functional independence    Personal Factors and Comorbidities Comorbidity 2;Age    Comorbidities L ankle surgery; hypertension; history of falling; L knee pain.    Examination-Activity Limitations Bathing;Bed Mobility;Locomotion Level;Stairs;Stand;Hygiene/Grooming;Bend;Caring for Others;Sleep;Lift;Transfers;Toileting;Squat;Carry    Examination-Participation Restrictions Yard Work;Driving;Meal Prep;Laundry;Shop;Cleaning    Stability/Clinical Decision Making Evolving/Moderate complexity    Rehab Potential Good    PT Frequency 2x / week    PT Duration 12 weeks    PT Treatment/Interventions ADLs/Self Care Home Management;Cryotherapy;Moist Heat;Gait training;Stair training;Functional mobility training;Therapeutic activities;Therapeutic exercise;Balance training;Neuromuscular re-education;Manual techniques;Joint Manipulations;Passive range of motion;Dry needling;DME Instruction    PT Next Visit Plan Strengthening, ROM and muscle endurance, gait training when appropriate    PT Home Exercise Plan Medbridge: [email or web address]    Consulted and Agree with Plan of Care Patient           Patient will benefit from skilled therapeutic  intervention in order to improve the following deficits and impairments:  Abnormal gait, Decreased endurance, Decreased activity tolerance, Decreased range of motion, Decreased strength, Pain, Impaired flexibility, Difficulty walking, Decreased balance, Impaired perceived functional ability, Decreased knowledge of use of DME, Decreased mobility  Visit Diagnosis: Acute pain of left knee  Unsteadiness on feet  Muscle weakness (generalized)     Problem List Patient Active Problem List   Diagnosis Date Noted  . Hypertension   . Hyperlipidemia   . Asthma   . Tibial plateau fracture, left, closed, with nonunion, subsequent encounter 08/03/2019   Everlean Alstrom. Graylon Good, PT, DPT 12/25/19, 3:11 PM  [facility] [address] [place], [address] Phone: [phone]   Fax:  [phone]  Name: HARDY HARCUM MRN: [id number] Date of Birth: May 20, 1946

## 2019-12-27 ENCOUNTER — Encounter: Payer: Self-pay | Admitting: Physical Therapy

## 2019-12-27 ENCOUNTER — Ambulatory Visit: Payer: PPO | Admitting: Physical Therapy

## 2019-12-27 ENCOUNTER — Other Ambulatory Visit: Payer: Self-pay

## 2019-12-27 DIAGNOSIS — M25562 Pain in left knee: Secondary | ICD-10-CM

## 2019-12-27 DIAGNOSIS — M6281 Muscle weakness (generalized): Secondary | ICD-10-CM

## 2019-12-27 DIAGNOSIS — R2681 Unsteadiness on feet: Secondary | ICD-10-CM

## 2019-12-27 NOTE — Therapy (Signed)
[facility] [address] [place], [address] Phone: [phone]   Fax:  [phone]  Physical Therapy Treatment / Discharge Summary Reporting period: 04/18/2019 - 12/27/2019  Patient Details  Name: Glenn Watson MRN: [id number] Date of Birth: [birth year] Referring Provider (PT): Altamese [place], MD   Encounter Date: 12/27/2019   PT End of Session - 12/27/19 1648    Visit Number 62    Number of Visits 77    Date for PT Re-Evaluation 01/30/20    Authorization Type [employer] reporting period from 12/25/2019    Authorization Time Period N/A    Authorization - Visit Number 2    Authorization - Number of Visits 10    Progress Note Due on Visit 72   Most recent FOTO = 70 (12/27/2019)   PT Start Time 1120    PT Stop Time 1200    PT Time Calculation (min) 40 min    Equipment Utilized During Treatment Gait belt    Activity Tolerance Patient tolerated treatment well;No increased pain    Behavior During Therapy WFL for tasks assessed/performed           Past Medical History:  Diagnosis Date  . Anemia   . Asthma    as a child, exercise asthma - none since high school"  . Cancer ([place])    skin cancer - basal, squamous- Mylenoma - stage 2- chest  . Complication of anesthesia    "very sensitive to medications" "Had colonscopy with just Draminine"  . Family history of adverse reaction to anesthesia    daughter- N/V  . Hyperlipidemia   . Hypertension   . PONV (postoperative nausea and vomiting)     Past Surgical History:  Procedure Laterality Date  . APPENDECTOMY  2011   Dr. Bary Castilla  . COLONOSCOPY WITH PROPOFOL N/A 02/10/2017   Procedure: COLONOSCOPY WITH PROPOFOL;  Surgeon: Christene Lye, MD;  Location: [facility];  Service: Endoscopy;  Laterality: N/A;  . ORIF TIBIA PLATEAU Left 08/03/2019   Procedure: OPEN REDUCTION INTERNAL FIXATION (ORIF) TIBIAL PLATEAU;  Surgeon: Altamese [place], MD;   Location: [facility];  Service: Orthopedics;  Laterality: Left;    There were no vitals filed for this visit.   Subjective Assessment - 12/27/19 1640    Subjective Patient reports no pain upon arrival. Notes he had some glute soreness following last session's lunges and has a bit of that soreness remaining today but he is not concerned about that. He reports he feels ready to discharge today and is confident in his abilty to continue his progress with long term HEP and fitness program that he is already participating in. He went to the pool since last session and did some back kicking for several hundred yards and did a lot of water walking. He feels like he is getting closer to being able to stand up out of a chair without hands and thinks he understands how to continue working on it until he gets there.    Pertinent History L ankle surgery; hypertension; history of falling; L knee pain.    Limitations Lifting;Standing;Walking;House hold activities   must use hands to get up from standard chair, limits walking at times, some of his gym exercises, is still avoiding walking on unstable surfaces, curbs, hiking. Is planning to go back to work for a few hours at a time soon.   How long can you stand comfortably? standing and walking without sitting for over 1 -  3 hours at work.    How long can you walk comfortably? able to take neighborhood walks.    Diagnostic tests CT scan L knee report 07/18/2019 "IMPRESSION: 1. 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 2. Partial healing of the impacted fracture of the proximal fibula. 3. Moderate knee joint effusion."    Patient Stated Goals Able to walk without AD    Currently in Pain? No/denies                [facility] PT Assessment - 12/27/19 0001      Assessment   Medical Diagnosis Lt Tibial Plateau nonunion, s/p repair    ORIF completed 07/03/2019   Referring Provider (PT) Altamese [place], MD    Onset Date/Surgical Date 03/27/19    fall 03/27/2019; sx 08/03/2019   Hand Dominance Right      Precautions   Precautions Other (comment)    Precaution Comments avoid knee extension machine, no longer need brace      Restrictions   Weight Bearing Restrictions Yes    LLE Weight Bearing Weight bearing as tolerated   see comment below   Other Position/Activity Restrictions 11/29/2019 MD visit:released from MD care. Continue PT as desired/needed.      Home Environment   Living Environment Private residence    Living Arrangements Spouse/significant other    Available Help at Discharge Family    Type of [place] to enter;Level entry    [place] Two level    Alternate Level Stairs-Number of Steps --   [place] - 2 wheels;Crutches      Prior Function   Level of Independence Independent    Vocation Retired    Psychologist, clinical, cooking, reading      Cognition   Overall Cognitive Status Within Functional Limits for tasks assessed      Observation/Other Assessments   Observations --    Focus on Therapeutic Outcomes (FOTO)  70      Functional Gait  Assessment   Gait Level Surface Walks 20 ft in less than 5.5 sec, no assistive devices, good speed, no evidence for imbalance, normal gait pattern, deviates no more than 6 in outside of the 12 in walkway width.    Change in Gait Speed Able to smoothly change walking speed without loss of balance or gait deviation. Deviate no more than 6 in outside of the 12 in walkway width.    Gait with Horizontal Head Turns Performs head turns smoothly with no change in gait. Deviates no more than 6 in outside 12 in walkway width    Gait with Vertical Head Turns Performs head turns with no change in gait. Deviates no more than 6 in outside 12 in walkway width.    Gait and Pivot Turn Pivot turns safely within 3 sec and stops quickly with no loss of balance.    Step Over Obstacle Is able to step over 2 stacked shoe  boxes taped together (9 in total height) without changing gait speed. No evidence of imbalance.    Gait with Narrow Base of Support Is able to ambulate for 10 steps heel to toe with no staggering.    Gait with Eyes Closed Walks 20 ft, uses assistive device, slower speed, mild gait deviations, deviates 6-10 in outside 12 in walkway width. Ambulates 20 ft in less than 9 sec but greater than 7 sec.  Ambulating Backwards Walks 20 ft, uses assistive device, slower speed, mild gait deviations, deviates 6-10 in outside 12 in walkway width.    Steps Alternating feet, no rail.    Total Score 28           OBJECTIVE:  FUNCTIONAL OUTCOME MEASURE FOTO =70(12/27/2019)   PROM Knee  Flexion: L [id number]  Extension:0 degrees  MUSCLE PERFORMANCE (MMT) R/L 4+/4Hip abduction 4+/4+ Hip adduction  FUNCTIONAL TESTS:  Able to sit <> stand from 18 inch surface with hands crossed over chest. 5TSTS in 19 seconds (controlling lower). Mild deviation of stance towards R LE. (today is the first time he was able to stand up without hands from this level)  Stairs: able to ascend and descend four 6-inch steps with reciprocal gait and BUE support. Has some difficulty controlling descent off of 8 inch step leading with R LE down.   Able to complete back lunge with BUE support in front with great effort tapping knee to airex 2x10 each side. Excessive but improving forward lean with L LE back.    TREATMENT: 11/29/2019 MD visit:released from MD care. Continue PT as desired/needed.  Therapeutic exercise:to centralize symptoms and improve ROM, strength, muscular endurance, and activity tolerance required for successful completion of functional activities.  - Measurements to assess progress (includes FGA,  MMT, PROM, 5TSTS; see above).  - sit <> stand from 18.5 plinth with hands in front of body controlling movement: x2, x6; from 18 inch chair x1 x6. - standing heel taps at stairs, sideways  with improved hip hinge 1x10 each side. Very difficult but understands concept.  - closed chain knee flexion stretch and strengthening with one leg on 2nd stair and BUE support as needed. 1x 10 each side attempting to use leg to support body into deeper and deeper lunge on the stair.   Pt required multimodal cuing for proper technique and to facilitate improved neuromuscular control, strength, range of motion, and functional ability resulting in improved performance and form.Supervision for safety.     HOME EXERCISE PROGRAM Access Code: [email or web address] URL: [email or web address] Date: 12/27/2019 Prepared by: Rosita Kea  Exercises Doorway Pec Stretch at 90 Degrees Abduction - 1-2 x daily - 3 reps - 30 seconds hold Diagonal Hip Extension - 1 x daily - 3 sets - 10 reps - 4 seconds hold Squat with Chair Touch - 1 x daily - 3 sets - 10 reps Lateral Step Down - 3 sets - 10-20 reps Standing Knee Flexion Stretch on Step - 2-3 sets - 10 reps - 5 seconds hold    PT Short Term Goals - 08/14/19 1328      PT SHORT TERM GOAL #1   Title Be independent with initial home exercise program for self-management of symptoms.    Baseline initial HEP provided at IE (04/18/2019);    Time 2    Period Weeks    Status Achieved    Target Date 05/02/19             PT Long Term Goals - 12/27/19 1652      PT LONG TERM GOAL #1   Title Be independent with a long-term home exercise program for self-management of symptoms.    Baseline initial HEP provided at IE (04/18/2019); continues to participate in currently appropriate HEP (05/18/2019; 06/29/2019); updated HEP as appropriate for post-op rehab (08/14/2019); currently participating in appropriate HEP, has not yet received final long term HEP (08/24/2019; 09/26/2019; 10/24/2019; 11/07/2019; 12/12/2019); discharged with long term HEP (12/27/2019);  Time 12    Period Weeks    Status Achieved    Target Date 01/30/20      PT LONG TERM GOAL #2   Title  Demonstrate improved FOTO score by 10 units to demonstrate improvement in overall condition and self-reported functional ability.    Baseline 44 (04/18/2019); 52 (05/18/2019); 52 (06/29/2019); FOTO = 40 (08/14/2019); 51 (09/26/2019); 60 (10/24/2019); 63 (11/07/2019); 67 (12/12/2019); 70 (12/27/2019);    Time 12    Period Weeks    Status Achieved    Target Date 01/30/20      PT LONG TERM GOAL #3   Title Complete community, work and/or recreational activities without limitation due to current condition.    Baseline Difficulites with walking, standing, shopping, driving, cooking and bathing, currently NWB L LE (04/18/2019); continues with similar difficulties but is now PWB25% and is able to drive, cross legs to don shoes (05/18/2019); improving but continues to have limitations and cannot perform activities that require single leg stance full weight bearing (06/29/2019); currently limited similarly to intial eval due to recent surgery 08/03/2019 to repair non-union fracture (08/14/2019); continues to have similar limitations (08/24/2019); is improving again but is not yet weight bearing that produces similar limitations, able to shower in 30 min independently now (09/26/2019); continues to improve but is still using an assistive device and WBAT (10/24/2019); continues to improve and is currently weaning from AD, improved abilty to ambulate, cook, complete ADLs, etc (11/07/2019); making excellent progress - has not yet returned to hiking, not yet able to get up from chair without, long walks over uneven ground (12/12/2019); patient is satisfied with his current return to PLOF and feels comfortable continuing to work on it - has not hiked but could for short distances with hiking poles, able to stand from chair with no UE as of today, is taking longer walks over semi-even ground (12/27/2019);    Time 12    Period Weeks    Status Achieved    Target Date 01/30/20      PT LONG TERM GOAL #4   Title Increase L knee flexion  AROM to 140 degrees to be able to complete ADLs without limitation of knee flexion    Baseline PROM 95 degrees of flexion with pain at end range and AROM 80 degrees(04/18/2019); AAROM 110 with end range pain (05/18/2019); PROM 0-126, AROM ext -25, AAROM flex 115 (06/29/2019); PROM lacking 10 degrees extension, up to 100 degrees flexion (with brace on) (08/14/2019); PROM extension lacking ~ 2 degrees, PROM flexion: 110 degrees (08/24/2019); PROM extension to 0 degrees, PROM flexion 122 degrees (09/26/2019); 0-116 AROM; PROM 0-125 (10/24/2019); 0-132 (11/07/2019); PROM 0-125 (12/12/2019); PROM 0-125 functional (12/27/2019);    Time 12    Period Weeks    Status Partially Met    Target Date 01/30/20      PT LONG TERM GOAL #5   Title Pt will increase LE strength to equal or greater than 4+/5 MMT in order to demonstrate improvement in strength and function.    Baseline See objectives on IE(04/18/2019); still 2/10 L knee extension (lacking last 22 degrees against gravity) (05/18/2019); continues to lack terminal knee extension in open change position, improved to at least 4/5 with discomfort within available range (06/29/2019); has decreased in since last session due to recent surgery and away from PT 2 weeks (08/14/2019); 25 degree extensor lag (08/24/2019); has 15 degree extensor lag with etim, MMT improving - see objective (09/26/2019); improving, extensor lag now 3 degrees in ASLR (  10/24/2019); continues to improve - see objective (11/07/2019); met except L hip abduction which is 4/5 (12/12/2019; 12/27/2019);    Time 12    Period Weeks    Status Partially Met    Target Date 01/30/20                 Plan - 12/27/19 1656    Clinical Impression Statement Patient has attended 33 physical therapy sessions over the course of his care. He initially made good progress, then experienced delay and setback when it was found his leg did not heal and had non-union. After undergoing ORIF he started making steady progress  again and has now satisfactorily met all of his goals and is discharged to a long term HEP to continue his progress independently.    Personal Factors and Comorbidities Comorbidity 2;Age    Comorbidities L ankle surgery; hypertension; history of falling; L knee pain.    Examination-Activity Limitations Bathing;Bed Mobility;Locomotion Level;Stairs;Stand;Hygiene/Grooming;Bend;Caring for Others;Sleep;Lift;Transfers;Toileting;Squat;Carry    Examination-Participation Restrictions Yard Work;Driving;Meal Prep;Laundry;Shop;Cleaning    Stability/Clinical Decision Making Evolving/Moderate complexity    Rehab Potential Good    PT Frequency 2x / week    PT Duration 12 weeks    PT Treatment/Interventions ADLs/Self Care Home Management;Cryotherapy;Moist Heat;Gait training;Stair training;Functional mobility training;Therapeutic activities;Therapeutic exercise;Balance training;Neuromuscular re-education;Manual techniques;Joint Manipulations;Passive range of motion;Dry needling;DME Instruction    PT Next Visit Plan Patient is now discharged from PT due to improvement in his condition    PT Home Exercise Plan Medbridge: [email or web address]    Consulted and Agree with Plan of Care Patient           Patient will benefit from skilled therapeutic intervention in order to improve the following deficits and impairments:  Abnormal gait, Decreased endurance, Decreased activity tolerance, Decreased range of motion, Decreased strength, Pain, Impaired flexibility, Difficulty walking, Decreased balance, Impaired perceived functional ability, Decreased knowledge of use of DME, Decreased mobility  Visit Diagnosis: Acute pain of left knee  Unsteadiness on feet  Muscle weakness (generalized)     Problem List Patient Active Problem List   Diagnosis Date Noted  . Hypertension   . Hyperlipidemia   . Asthma   . Tibial plateau fracture, left, closed, with nonunion, subsequent encounter 08/03/2019    Everlean Alstrom. Graylon Good, PT,  DPT 12/27/19, 4:57 PM  [facility] [address] [place], [address] Phone: [phone]   Fax:  [phone]  Name: Glenn Watson MRN: [id number] Date of Birth: 11-30-1945

## 2020-01-03 ENCOUNTER — Ambulatory Visit: Payer: PPO | Admitting: Physical Therapy

## 2020-01-08 ENCOUNTER — Encounter: Payer: PPO | Admitting: Physical Therapy

## 2020-01-10 ENCOUNTER — Encounter: Payer: PPO | Admitting: Physical Therapy

## 2020-01-15 ENCOUNTER — Encounter: Payer: PPO | Admitting: Physical Therapy

## 2020-01-17 ENCOUNTER — Encounter: Payer: PPO | Admitting: Physical Therapy

## 2020-01-22 ENCOUNTER — Encounter: Payer: PPO | Admitting: Physical Therapy

## 2020-01-24 ENCOUNTER — Encounter: Payer: PPO | Admitting: Physical Therapy

## 2020-02-15 DIAGNOSIS — Z125 Encounter for screening for malignant neoplasm of prostate: Secondary | ICD-10-CM | POA: Diagnosis not present

## 2020-02-15 DIAGNOSIS — Z79899 Other long term (current) drug therapy: Secondary | ICD-10-CM | POA: Diagnosis not present

## 2020-02-15 DIAGNOSIS — R7309 Other abnormal glucose: Secondary | ICD-10-CM | POA: Diagnosis not present

## 2020-02-15 DIAGNOSIS — E7800 Pure hypercholesterolemia, unspecified: Secondary | ICD-10-CM | POA: Diagnosis not present

## 2020-02-15 DIAGNOSIS — I10 Essential (primary) hypertension: Secondary | ICD-10-CM | POA: Diagnosis not present

## 2020-02-22 DIAGNOSIS — I10 Essential (primary) hypertension: Secondary | ICD-10-CM | POA: Diagnosis not present

## 2020-02-22 DIAGNOSIS — E7800 Pure hypercholesterolemia, unspecified: Secondary | ICD-10-CM | POA: Diagnosis not present

## 2020-02-22 DIAGNOSIS — Z0000 Encounter for general adult medical examination without abnormal findings: Secondary | ICD-10-CM | POA: Diagnosis not present

## 2020-02-22 DIAGNOSIS — D649 Anemia, unspecified: Secondary | ICD-10-CM | POA: Diagnosis not present

## 2020-03-11 DIAGNOSIS — H40003 Preglaucoma, unspecified, bilateral: Secondary | ICD-10-CM | POA: Diagnosis not present

## 2020-04-22 ENCOUNTER — Ambulatory Visit: Payer: PPO | Attending: Internal Medicine

## 2020-04-22 DIAGNOSIS — Z23 Encounter for immunization: Secondary | ICD-10-CM

## 2020-04-22 NOTE — Progress Notes (Signed)
° °  Covid-19 Vaccination Clinic  Name:  Glenn Watson    MRN: [id number] DOB: [birth year]  04/22/2020  Mr. Glenn Watson was observed post Covid-19 immunization for 15 minutes without incident. He was provided with Vaccine Information Sheet and instruction to access the V-Safe system.   Mr. Glenn Watson was instructed to call 911 with any severe reactions post vaccine:  Difficulty breathing   Swelling of face and throat   A fast heartbeat   A bad rash all over body   Dizziness and weakness

## 2020-05-15 DIAGNOSIS — D485 Neoplasm of uncertain behavior of skin: Secondary | ICD-10-CM | POA: Diagnosis not present

## 2020-05-15 DIAGNOSIS — Z85828 Personal history of other malignant neoplasm of skin: Secondary | ICD-10-CM | POA: Diagnosis not present

## 2020-05-15 DIAGNOSIS — C44729 Squamous cell carcinoma of skin of left lower limb, including hip: Secondary | ICD-10-CM | POA: Diagnosis not present

## 2020-05-15 DIAGNOSIS — Z85820 Personal history of malignant melanoma of skin: Secondary | ICD-10-CM | POA: Diagnosis not present

## 2020-05-15 DIAGNOSIS — L821 Other seborrheic keratosis: Secondary | ICD-10-CM | POA: Diagnosis not present

## 2020-05-15 DIAGNOSIS — L570 Actinic keratosis: Secondary | ICD-10-CM | POA: Diagnosis not present

## 2020-05-15 DIAGNOSIS — Z08 Encounter for follow-up examination after completed treatment for malignant neoplasm: Secondary | ICD-10-CM | POA: Diagnosis not present

## 2020-05-15 DIAGNOSIS — D225 Melanocytic nevi of trunk: Secondary | ICD-10-CM | POA: Diagnosis not present

## 2020-05-22 DIAGNOSIS — C44719 Basal cell carcinoma of skin of left lower limb, including hip: Secondary | ICD-10-CM | POA: Diagnosis not present

## 2020-08-22 DIAGNOSIS — D649 Anemia, unspecified: Secondary | ICD-10-CM | POA: Diagnosis not present

## 2020-08-22 DIAGNOSIS — Z125 Encounter for screening for malignant neoplasm of prostate: Secondary | ICD-10-CM | POA: Diagnosis not present

## 2020-09-05 ENCOUNTER — Other Ambulatory Visit: Payer: Self-pay

## 2020-09-05 ENCOUNTER — Encounter: Payer: Self-pay | Admitting: Urology

## 2020-09-05 ENCOUNTER — Ambulatory Visit: Payer: PPO | Admitting: Urology

## 2020-09-05 VITALS — BP 130/80 | HR 59 | Ht 75.0 in | Wt 222.2 lb

## 2020-09-05 DIAGNOSIS — R972 Elevated prostate specific antigen [PSA]: Secondary | ICD-10-CM | POA: Diagnosis not present

## 2020-09-05 NOTE — Progress Notes (Signed)
09/05/20 9:41 AM   Glenn Watson January 04, 1946 [id number]  CC: Elevated PSA  HPI: I saw Glenn Watson in urology clinic today for elevated PSA.  He is a healthy 75 year old male with no family history of prostate or breast cancer.  He has a long history of very mildly elevated PSA starting at 4.4 in November 2018, 6.1 in August 2019, 5.9 in September 2019, 6.2 in August 2020, and 5.2 in August 2021.  His most recent PSA had increased to 8.07 on 08/22/2020.  He has never undergone prostate biopsy before.  He started Flomax in 2019 for unclear reasons, but it sounds like this was started for a mildly elevated PSA.  He really denies any urinary symptoms.  He denies any weight loss or bone pain.  He denies any trouble with erections.  Prior abdominal surgery notable for appendectomy.   PMH: Past Medical History:  Diagnosis Date  . Anemia   . Asthma    as a child, exercise asthma - none since high school"  . Cancer ([place])    skin cancer - basal, squamous- Mylenoma - stage 2- chest  . Complication of anesthesia    "very sensitive to medications" "Had colonscopy with just Draminine"  . Family history of adverse reaction to anesthesia    daughter- N/V  . Hyperlipidemia   . Hypertension   . PONV (postoperative nausea and vomiting)     Surgical History: Past Surgical History:  Procedure Laterality Date  . APPENDECTOMY  2011   Dr. Bary Castilla  . COLONOSCOPY WITH PROPOFOL N/A 02/10/2017   Procedure: COLONOSCOPY WITH PROPOFOL;  Surgeon: Christene Lye, MD;  Location: [facility];  Service: Endoscopy;  Laterality: N/A;  . ORIF TIBIA PLATEAU Left 08/03/2019   Procedure: OPEN REDUCTION INTERNAL FIXATION (ORIF) TIBIAL PLATEAU;  Surgeon: Altamese Harper, MD;  Location: [facility];  Service: Orthopedics;  Laterality: Left;    Family History: No family history on file.  Social History:  reports that he has never smoked. He has never used smokeless tobacco. He reports current alcohol use of about  35.0 standard drinks of alcohol per week. He reports that he does not use drugs.  Physical Exam: There were no vitals taken for this visit.   Constitutional:  Alert and oriented, No acute distress. Cardiovascular: No clubbing, cyanosis, or edema. Respiratory: Normal respiratory effort, no increased work of breathing. GI: Abdomen is soft, nontender, nondistended, no abdominal masses DRE: 60 g, right lobe larger than left lobe, no nodules or masses  Laboratory Data: Reviewed, see HPI  Pertinent Imaging: None to review  Assessment & Plan:   75 year old male with a mildly elevated PSA over the last 3 years of ~6, with slight increase to 8.1 most recently in February 2022.  We reviewed the implications of an elevated PSA and the uncertainty surrounding it. In general, a man's PSA increases with age and is produced by both normal and cancerous prostate tissue. The differential diagnosis for elevated PSA includes BPH, prostate cancer, infection, recent intercourse/ejaculation, recent urethroscopic manipulation (foley placement/cystoscopy) or trauma, and prostatitis.  We also reviewed the AUA guidelines that do not recommend routine screening in men over age 72.  Management of an elevated PSA can include observation or prostate biopsy and we discussed this in detail. Our goal is to detect clinically significant prostate cancers, and manage with either active surveillance, surgery, or radiation for localized disease. Risks of prostate biopsy include bleeding, infection (including life threatening sepsis), pain, and lower urinary symptoms. Hematuria,  hematospermia, and blood in the stool are all common after biopsy and can persist up to 4 weeks.   I recommended repeat PSA with reflex to free in 2 to 4 weeks, and will call with those results.  Consider prostate MRI or prostate biopsy if continues to rise  Nickolas Madrid, MD 09/05/2020  [facility] [address] [place], [place] [address] [phone]

## 2020-09-05 NOTE — Patient Instructions (Signed)
Prostate Cancer Screening  Prostate cancer screening is a test that is done to check for the presence of prostate cancer in men. The prostate gland is a walnut-sized gland that is located below the bladder and in front of the rectum in males. The function of the prostate is to add fluid to semen during ejaculation. Prostate cancer is the second most common type of cancer in men. Who should have prostate cancer screening?  Screening recommendations vary based on age and other risk factors. Screening is recommended if:  You are older than age 55. If you are age 55-69, talk with your health care provider about your need for screening and how often screening should be done. Because most prostate cancers are slow growing and will not cause death, screening is generally reserved in this age group for men who have a 10-15-year life expectancy.  You are younger than age 55, and you have these risk factors: ? Being a black male or a male of African descent. ? Having a father, brother, or uncle who has been diagnosed with prostate cancer. The risk is higher if your family member's cancer occurred at an early age. Screening is not recommended if:  You are younger than age 40.  You are between the ages of 40 and 54 and you have no risk factors.  You are 70 years of age or older. At this age, the risks that screening can cause are greater than the benefits that it may provide. If you are at high risk for prostate cancer, your health care provider may recommend that you have screenings more often or that you start screening at a younger age. How is screening for prostate cancer done? The recommended prostate cancer screening test is a blood test called the prostate-specific antigen (PSA) test. PSA is a protein that is made in the prostate. As you age, your prostate naturally produces more PSA. Abnormally high PSA levels may be caused by:  Prostate cancer.  An enlarged prostate that is not caused by cancer  (benign prostatic hyperplasia, BPH). This condition is very common in older men.  A prostate gland infection (prostatitis). Depending on the PSA results, you may need more tests, such as:  A physical exam to check the size of your prostate gland.  Blood and imaging tests.  A procedure to remove tissue samples from your prostate gland for testing (biopsy). What are the benefits of prostate cancer screening?  Screening can help to identify cancer at an early stage, before symptoms start and when the cancer can be treated more easily.  There is a small chance that screening may lower your risk of dying from prostate cancer. The chance is small because prostate cancer is a slow-growing cancer, and most men with prostate cancer die from a different cause. What are the risks of prostate cancer screening? The main risk of prostate cancer screening is diagnosing and treating prostate cancer that would never have caused any symptoms or problems. This is called overdiagnosisand overtreatment. PSA screening cannot tell you if your PSA is high due to cancer or a different cause. A prostate biopsy is the only procedure to diagnose prostate cancer. Even the results of a biopsy may not tell you if your cancer needs to be treated. Slow-growing prostate cancer may not need any treatment other than monitoring, so diagnosing and treating it may cause unnecessary stress or other side effects. A prostate biopsy may also cause:  Infection or fever.  A false negative. This is   a result that shows that you do not have prostate cancer when you actually do have prostate cancer. Questions to ask your health care provider  When should I start prostate cancer screening?  What is my risk for prostate cancer?  How often do I need screening?  What type of screening tests do I need?  How do I get my test results?  What do my results mean?  Do I need treatment? Where to find more information  The American Cancer  Society: www.cancer.org  American Urological Association: www.auanet.org Contact a health care provider if:  You have difficulty urinating.  You have pain when you urinate or ejaculate.  You have blood in your urine or semen.  You have pain in your back or in the area of your prostate. Summary  Prostate cancer is a common type of cancer in men. The prostate gland is located below the bladder and in front of the rectum. This gland adds fluid to semen during ejaculation.  Prostate cancer screening may identify cancer at an early stage, when the cancer can be treated more easily.  The prostate-specific antigen (PSA) test is the recommended screening test for prostate cancer.  Discuss the risks and benefits of prostate cancer screening with your health care provider. If you are age 74 or older, the risks that screening can cause are greater than the benefits that it may provide. This information is not intended to replace advice given to you by your health care provider. Make sure you discuss any questions you have with your health care provider. Document Revised: 10/06/2019 Document Reviewed: 01/26/2019 Elsevier Patient Education  [place].     Transrectal Prostate Biopsy Patient Education and Post Procedure Instructions    -Definition A prostate biopsy is the removal of a small amount of tissue from the prostate gland. The tissue is examined to determine whether there is cancer.  -Reasons for Procedure A prostate biopsy is usually done after an abnormal finding by: Digital rectal exam Prostate specific antigen (PSA) blood test A prostate biopsy is the only way to find out if cancer cells are present.  -Possible Complications Problems from the procedure are rare, but all procedures have some risk including: Infection Bruising or lengthy bleeding from the rectum, or in urine or semen Difficulty urinating Reactions to anesthesia Factors that may increase the risk  of complications include: Smoking History of bleeding disorders or easy bruising Use of any medications, over-the-counter medications, or herbal supplements Sensitivity or allergy to latex, medications, or anesthesia.  -Prior to Procedure Talk to your doctor about your medications. Blood thinning medications including aspirin should be stopped 1 week prior to procedure. If prescribed by your cardiologist we may need approval before stopping medications. Use a Fleets enema 2 hours before the procedure. Can be purchased at your pharmacy. Antibiotics will be administered in the clinic prior to procedure.  Please make sure you eat a light meal prior to coming in for your appointment. This can help prevent lightheadedness during the procedure and upset stomach from antibiotics. Please bring someone with you to the procedure to drive you home.  -Anesthesia Transrectal biopsy: Local anesthesia--Just the area that is being operated on is numbed using an injectable anesthetic.  -Description of the Procedure Transrectal biopsy--Your doctor will insert a small ultrasound device into the rectum. This device will produce sound waves to create an image of the prostate. These images will help guide placement of the needle. Your doctor will then insert the needle  through the wall of the rectum and into the prostate gland. The procedure should take approximately 15-30 minutes.  -Will It Hurt? You may have discomfort and soreness at the biopsy site. Pain and discomfort after the procedure can be managed with medications.  -Postoperative Care When you return home after the procedure, do the following to help ensure a smooth recovery: Stay hydrated. Drink plenty of fluids for the next few days. Avoid difficult physical activity the day and evening of the procedure. Keep in mind that you may see blood in your urine, stool, or semen for several days. Resume any medications that were stopped when you are  advised to do so.  After the sample is taken, it will be sent to a pathologist for examination under a microscope. This doctor will analyze the sample for cancer. You will be scheduled for an appointment to discuss results. If cancer is present, your doctor will work with you to develop a treatment plan.   -Call Your Doctor or Seek Immediate Medical Attention It is important to monitor your recovery. Alert your doctor to any problems. If any of the following occur, call your doctor or go to the emergency room: Fever 100.5 or greater within 1 week post procedure go directly to ER Call the office for: Blood in the urine more than 1 week or in semen for more than 6 weeks post-biopsy Pain that you cannot control with the medications you have been given Pain, burning, urgency, or frequency of urination Cough, shortness of breath, or chest pain- if severe go to ER Heavy rectal bleeding or bleeding that lasts more than 1 week after the biopsy If you have any questions or concerns please contact our office at ([facility] [address], [place], [place] [address] [phone]

## 2020-09-18 ENCOUNTER — Other Ambulatory Visit: Payer: Self-pay

## 2020-09-18 ENCOUNTER — Other Ambulatory Visit: Payer: Self-pay | Admitting: *Deleted

## 2020-09-18 DIAGNOSIS — R972 Elevated prostate specific antigen [PSA]: Secondary | ICD-10-CM

## 2020-09-19 ENCOUNTER — Other Ambulatory Visit: Payer: PPO

## 2020-09-19 ENCOUNTER — Other Ambulatory Visit: Payer: Self-pay

## 2020-09-19 DIAGNOSIS — R972 Elevated prostate specific antigen [PSA]: Secondary | ICD-10-CM

## 2020-09-20 LAB — PSA TOTAL (REFLEX TO FREE): Prostate Specific Ag, Serum: 9.9 ng/mL — ABNORMAL HIGH (ref 0.0–4.0)

## 2020-09-20 LAB — FPSA% REFLEX
% FREE PSA: 15.2 %
PSA, FREE: 1.5 ng/mL

## 2020-10-24 ENCOUNTER — Encounter: Payer: Self-pay | Admitting: Urology

## 2020-10-24 ENCOUNTER — Other Ambulatory Visit: Payer: Self-pay

## 2020-10-24 ENCOUNTER — Ambulatory Visit: Payer: PPO | Admitting: Urology

## 2020-10-24 VITALS — BP 151/63 | HR 73 | Ht 75.0 in | Wt 223.0 lb

## 2020-10-24 DIAGNOSIS — R972 Elevated prostate specific antigen [PSA]: Secondary | ICD-10-CM

## 2020-10-24 DIAGNOSIS — Z7689 Persons encountering health services in other specified circumstances: Secondary | ICD-10-CM | POA: Diagnosis not present

## 2020-10-24 MED ORDER — LEVOFLOXACIN 500 MG PO TABS
500.0000 mg | ORAL_TABLET | Freq: Once | ORAL | Status: AC
  Administered 2020-10-24: 500 mg via ORAL

## 2020-10-24 MED ORDER — CEFTRIAXONE SODIUM 1 G IJ SOLR
1.0000 g | Freq: Once | INTRAMUSCULAR | Status: AC
  Administered 2020-10-24: 1 g via INTRAMUSCULAR

## 2020-10-24 MED ORDER — CEFTRIAXONE SODIUM 1 G IJ SOLR: 1.0000 g | INTRAMUSCULAR | Status: DC

## 2020-10-24 NOTE — Progress Notes (Signed)
   10/24/20  Indication: Elevated PSA, 9.9  Prostate Biopsy Procedure   Informed consent was obtained, and we discussed the risks of bleeding and infection/sepsis. A time out was performed to ensure correct patient identity.  Pre-Procedure: - Last PSA Level: 9.9 - Gentamicin and levaquin given for antibiotic prophylaxis - Transrectal Ultrasound performed revealing a 48 gm prostate, PSA density 0.21 - No median lobe -Hypoechoic region at left lateral prostate from the base to apex  Procedure: - Prostate block performed using 10 cc 1% lidocaine and biopsies taken from sextant areas, a total of 12 under ultrasound guidance.  Post-Procedure: - Patient tolerated the procedure well - He was counseled to seek immediate medical attention if experiences significant bleeding, fevers, or severe pain - Return in one week to discuss biopsy results  Assessment/ Plan: Will follow up in 1-2 weeks to discuss pathology  Nickolas Madrid, MD 10/24/2020

## 2020-10-24 NOTE — Patient Instructions (Signed)

## 2020-10-28 LAB — SURGICAL PATHOLOGY

## 2020-10-31 ENCOUNTER — Ambulatory Visit (INDEPENDENT_AMBULATORY_CARE_PROVIDER_SITE_OTHER): Payer: PPO | Admitting: Urology

## 2020-10-31 ENCOUNTER — Encounter: Payer: Self-pay | Admitting: Urology

## 2020-10-31 ENCOUNTER — Other Ambulatory Visit: Payer: Self-pay

## 2020-10-31 VITALS — BP 130/80 | Ht 75.0 in | Wt 222.0 lb

## 2020-10-31 DIAGNOSIS — C61 Malignant neoplasm of prostate: Secondary | ICD-10-CM

## 2020-10-31 NOTE — Patient Instructions (Signed)
Prostate Cancer  The prostate is a small gland (1.5 inches [3.8 cm] wide and 1 inch [2.5 cm] high) that is involved in the production of semen. It is located below a man's bladder, in front of the rectum. Prostate cancer is the abnormal growth of cells in the prostate gland. What are the causes? The exact cause of this condition is not known. What increases the risk? You are more likely to develop this condition if:  You are 74 years of age or older.  You are African American.  You have a family history of prostate cancer.  You have a family history of breast cancer. What are the signs or symptoms? Symptoms of this condition include:  A need to urinate often.  Weak or interrupted flow of urine.  Trouble starting or stopping urination.  Inability to urinate.  Blood in urine or semen.  Persistent pain or discomfort in the lower back, lower abdomen, hips, or upper thighs.  Trouble getting an erection.  Trouble emptying the bladder all the way. How is this diagnosed? This condition can be diagnosed with:  A digital rectal exam. For this exam, a health care provider inserts a gloved finger into the rectum to feel the prostate gland.  A blood test called a prostate-specific antigen (PSA) test.  A procedure in which a sample of tissue is taken from the prostate and checked under a microscope (prostate biopsy).  An imaging test called transrectal ultrasonography. Once the condition is diagnosed, tests will be done to determine how far the cancer has spread. This is called staging the cancer. Staging may involve imaging tests, such as:  A bone scan.  A CT scan.  A PET scan.  An MRI. The stages of prostate cancer are as follows:  Stage I. At this stage, the cancer is found in the prostate only. The cancer is not visible on imaging tests, and it is usually found by accident, such as during prostate surgery.  Stage II. At this stage, the cancer is more advanced than it is  in stage I, but the cancer has not spread outside the prostate.  Stage III. At this stage, the cancer has spread beyond the outer layer of the prostate to nearby tissues. The cancer may be found in the seminal vesicles, which are near the bladder and the prostate.  Stage IV. At this stage, the cancer has spread to other parts of the body, such as the lymph nodes, bones, bladder, rectum, liver, or lungs. How is this treated? Treatment for this condition depends on several factors, including the stage of the cancer, your age, personal preferences, and your overall health. Talk with your health care provider about treatment options that are recommended for you. Common treatments include:  Observation for early stage prostate cancer (active surveillance). This involves having exams, blood tests, and in some cases, more biopsies. For some men, this is the only treatment needed.  Surgery. Types of surgeries include: ? Open surgery (prostatectomy). In this surgery, a larger incision is made to remove the prostate. ? A laparoscopic prostatectomy. This is a surgery to remove the prostate and lymph nodes through several, small incisions. It is often referred to as a minimally invasive surgery. ? A robotic prostatectomy. This is laparoscopic surgery to remove the prostate and lymph nodes with the help of robotic arms that are controlled by the surgeon. ? Orchiectomy. This is surgery to remove the testicles. ? Cryosurgery. This is surgery to freeze and destroy cancer cells.  Radiation treatment. Types of radiation treatment include: ? External beam radiation. This type aims beams of radiation from outside the body at the prostate to destroy cancerous cells. ? Brachytherapy. This type uses radioactive needles, seeds, wires, or tubes that are implanted into the prostate gland. Like external beam radiation, brachytherapy destroys cancerous cells. An advantage is that this type of radiation limits the damage to  surrounding tissue and has fewer side effects.  High-intensity, focused ultrasonography. This treatment destroys cancer cells by delivering high-energy ultrasound waves to the cancerous cells.  Chemotherapy medicines. This treatment kills cancer cells or stops them from multiplying. It kills both cancer cells and normal cells.  Targeted therapy. This treatment uses medicines to kill cancer cells without damaging normal cells.  Hormone treatment. This treatment involves taking medicines that act on one of the male hormones (testosterone): ? By stopping your body from producing testosterone. ? By blocking testosterone from reaching cancer cells. Follow these instructions at home:  Take over-the-counter and prescription medicines only as told by your health care provider.  Maintain a healthy diet.  Get plenty of sleep.  Consider joining a support group for men who have prostate cancer. Meeting with a support group may help you learn to manage the stress of having cancer.  If you have to go to the hospital, notify your cancer specialist (oncologist).  Treatment for prostate cancer may affect sexual function. Continue to have intimate moments with your partner. This may include touching, holding, hugging, and caressing.  Keep all follow-up visits as told by your health care provider. This is important. Contact a health care provider if:  You have new or increasing trouble urinating.  You have new or increasing blood in your urine.  You have new or increasing pain in your hips, back, or chest. Get help right away if:  You have weakness or numbness in your legs.  You cannot control urination or your bowel movements (incontinence).  You have chills or a fever. Summary  The prostate is a small gland that is involved in the production of semen. It is located below a man's bladder, in front of the rectum.  Prostate cancer is the abnormal growth of cells in the prostate  gland.  Treatment for this condition depends on the stage of the cancer, your age, personal preferences, and your overall health. Talk with your health care provider about treatment options that are recommended for you.  Consider joining a support group for men who have prostate cancer. Meeting with a support group may help you learn to cope with the stress of having cancer. This information is not intended to replace advice given to you by your health care provider. Make sure you discuss any questions you have with your health care provider. Document Revised: 05/30/2019 Document Reviewed: 05/30/2019 Elsevier Patient Education  2021 [place] Laparoscopic Prostatectomy  Robot-assisted laparoscopic prostatectomy is a minimally invasive procedure to remove the entire prostate, or prostate gland, and the seminal vesicles. The seminal vesicles are near the bladder and the prostate. This procedure is done to treat prostate cancer that has not spread to other parts of the body (has not metastasized). The goals of the procedure are to remove all cancer cells and to prevent prostate cancer from metastasizing. This procedure involves use of a thin, lighted tube with a tiny camera on the end (laparoscope). The laparoscope will allow your surgeon to do the surgery with several small incisions in your abdomen instead of a large  incision. Your surgeon will also use robotic arms during the procedure that will be controlled through a computer. During your procedure, the lymph nodes in your pelvis may be removed. Lymph nodes, also called lymph glands, are part of your body's disease-fighting system (immune system). The lymph nodes in the pelvis may be the first place that is affected by the spreading of prostate cancer. If your pelvic lymph nodes are removed, tissue from the nodes will be tested for cancer cells. Tell a health care provider about:  Any allergies you have.  All medicines you  are taking, including vitamins, herbs, eye drops, creams, and over-the-counter medicines.  Any problems you or family members have had with anesthetic medicines.  Any blood disorders you have.  Any surgeries you have had.  Any medical conditions you have.  Any prostate infections you have had. What are the risks? Generally, this is a safe procedure. However, problems may occur, including:  Infection.  Bleeding.  Allergic reactions to medicines.  Problems that affect urination or sexual function. These may include: ? Inability to control when you urinate (incontinence). This is usually temporary. ? Narrowing or scarring (stricture) of the urethra, which is the part that drains urine from your bladder. Stricture may block the flow of urine. ? Inability to get or keep an erection (erectile dysfunction). ? Dry ejaculation. This is when no semen is released during orgasm.  Blockage (obstruction) of the large intestine or small intestine.  Blood clots in the legs.  The formation of a lymphocele. This is a sac (cyst) in the pelvis that is filled with fluid from the lymph nodes.  Damage to nearby structures or organs. What happens before the procedure? Staying hydrated Follow instructions from your health care provider about hydration, which may include:  Up to 2 hours before the procedure - you may continue to drink clear liquids, such as water, clear fruit juice, black coffee, and plain tea.   Eating and drinking restrictions Follow instructions from your health care provider about eating and drinking, which may include:  8 hours before the procedure - stop eating heavy meals or foods, such as meat, fried foods, or fatty foods.  6 hours before the procedure - stop eating light meals or foods, such as toast or cereal.  6 hours before the procedure - stop drinking milk or drinks that contain milk.  2 hours before the procedure - stop drinking clear liquids. Medicines Ask your  health care provider about:  Changing or stopping your regular medicines. This is especially important if you are taking diabetes medicines or blood thinners.  Taking medicines such as aspirin and ibuprofen. These medicines can thin your blood. Do not take these medicines unless your health care provider tells you to take them.  Taking over-the-counter medicines, vitamins, herbs, and supplements. General instructions  Do not use any products that contain nicotine or tobacco for at least 4 weeks before the procedure. These products include cigarettes, e-cigarettes, and chewing tobacco. If you need help quitting, ask your health care provider.  Plan to have someone take you home from the hospital or clinic.  If you will be going home right after the procedure, plan to have someone with you for 24 hours.  You may have an exam or testing. This may include a CT scan or an MRI.  You may have a blood or urine sample taken.  Ask your health care provider: ? How your surgery site will be marked. ? What steps will be  taken to help prevent infection. These may include:  Removing hair at the surgery site.  Washing skin with a germ-killing soap.  Taking antibiotic medicine. What happens during the procedure?  An IV will be inserted into one of your veins.  You will be given one or more of the following: ? A medicine to help you relax (sedative). ? A medicine to make you fall asleep (general anesthetic).  A thin, flexible tube (catheter) will be inserted through your urethra and into your bladder. The catheter will drain urine from your bladder during the procedure and while you heal.  Four or five small incisions will be made in your abdomen.  The laparoscope and other surgical instruments will be put through the incisions. Your surgeon will use the laparoscope and a robotic arm to help control the surgical instruments.  Your urethra will be cut and separated from your bladder, and your  prostate and seminal vesicles will be removed.  Your pelvic lymph nodes may also be removed for testing.  Your urethra will be reconnected to your bladder. This will be done so your catheter is still in place inside of your urethra.  A small tube (drain) may be placed in one or more of your incisions to help drain extra fluid from your surgical site.  Your incisions will be closed with stitches (sutures), skin glue, or adhesive strips.  Medicine may be applied to your incisions.  Bandages (dressings) will be placed over your incisions. The procedure may vary among health care providers and hospitals. What happens after the procedure?  Your blood pressure, heart rate, breathing rate, and blood oxygen level will be monitored until you leave the hospital or clinic.  You may continue to receive fluids and medicines through an IV.  You will have some pain. You may receive medicines for pain.  Your catheter will remain in place to drain urine from your bladder.  You may have fluid coming from a drain in any incision.  You may have to wear compression stockings. These stockings help to prevent blood clots and reduce swelling in your legs.  You will be encouraged to move around as much as you can.  If you were given a sedative during the procedure, it can affect you for several hours. Do not drive or operate machinery until your health care provider says that it is safe. Summary  Robot-assisted laparoscopic prostatectomy is a procedure to remove the prostate and other tissue.  Follow instructions from your health care provider about eating and drinking before your surgery.  You will have a catheter in your bladder after your surgery to drain urine. This information is not intended to replace advice given to you by your health care provider. Make sure you discuss any questions you have with your health care provider. Document Revised: 04/20/2019 Document Reviewed: 04/20/2019 Elsevier  Patient Education  2021 [employer].

## 2020-10-31 NOTE — Progress Notes (Signed)
   10/31/2020 4:22 PM   Glenn Watson 03/12/1946 [id number]  Reason for visit: Discuss prostate biopsy results  HPI: I saw Glenn Watson and his wife today in clinic to discuss his biopsy results.  He is a healthy 74 year old male who was found of an elevated PSA of 9.9.  He underwent a prostate biopsy on 10/24/2020 that showed a 48 g prostate, with 4/12 cores positive for Gleason score 4+3=7 prostate acinar adenocarcinoma.  Max core involvement was 92%, including 90% pattern 4 with cribriform features.    He does not have any significant urinary symptoms or erectile dysfunction at baseline.  We had a lengthy conversation today about the patient's new diagnosis of prostate cancer.  We reviewed the risk classifications per the AUA guidelines including very low risk, low risk, intermediate risk, and high risk disease, and the need for additional staging imaging with CT and bone scan in patients with unfavorable intermediate risk and high risk disease.  I explained that his life expectancy, clinical stage, Gleason score, PSA, and other co-morbidities influence treatment strategies.  We discussed the roles of active surveillance, radiation therapy, surgical therapy with robotic prostatectomy, and hormone therapy with androgen deprivation.  We discussed that patients urinary symptoms also impact treatment strategy, as patients with severe lower urinary tract symptoms may have significant worsening or even develop urinary retention after undergoing radiation.  In regards to surgery, we discussed robotic prostatectomy +/- lymphadenectomy at length.  The procedure takes 3 to 4 hours, and patient's typically discharge home on post-op day #1.  A Foley catheter is left in place for 7 to 10 days to allow for healing of the vesicourethral anastomosis.  There is a small risk of bleeding, infection, damage to surrounding structures or bowel, hernia, DVT/PE, or serious cardiac or pulmonary complications.  We discussed  at length post-op side effects including erectile dysfunction, and the importance of pre-operative erectile function on long-term outcomes.  Even with a nerve sparing approach, there is an approximately 25% rate of permanent erectile dysfunction.  We also discussed postop urinary incontinence at length.  We expect patients to have stress incontinence post-operatively that will improve over period of weeks to months.  Less than 10% of men will require a pad at 1 year after surgery.  Patients will need to avoid heavy lifting and strenuous activity for 3 to 4 weeks, but most men return to their baseline activity status by 6 weeks.  -CT and bone scan ordered for staging-> we will call with results -If no evidence of metastatic disease, will place referral to radiation oncology to consider all options prior to deciding on a treatment strategy.  With his good health, I think surgery and radiation are both very viable options if he has localized disease   I spent 45 total minutes on the day of the encounter including pre-visit review of the medical record, face-to-face time with the patient, and post visit ordering of labs/imaging/tests.  Billey Co, [facility] [address] [place], [place] [address] [phone]

## 2020-11-01 ENCOUNTER — Ambulatory Visit
Admission: RE | Admit: 2020-11-01 | Discharge: 2020-11-01 | Disposition: A | Discharge status: Home / Self Care | Payer: PPO | Source: Ambulatory Visit | Attending: Urology | Admitting: Urology

## 2020-11-01 DIAGNOSIS — K7689 Other specified diseases of liver: Secondary | ICD-10-CM | POA: Diagnosis not present

## 2020-11-01 DIAGNOSIS — C61 Malignant neoplasm of prostate: Secondary | ICD-10-CM | POA: Diagnosis not present

## 2020-11-01 DIAGNOSIS — K5750 Diverticulosis of both small and large intestine without perforation or abscess without bleeding: Secondary | ICD-10-CM | POA: Diagnosis not present

## 2020-11-01 DIAGNOSIS — N200 Calculus of kidney: Secondary | ICD-10-CM | POA: Diagnosis not present

## 2020-11-01 LAB — POCT I-STAT CREATININE: Creatinine, Ser: 0.9 mg/dL (ref 0.61–1.24)

## 2020-11-01 MED ORDER — IOHEXOL 300 MG/ML  SOLN
100.0000 mL | Freq: Once | INTRAMUSCULAR | Status: AC | PRN
  Administered 2020-11-01: 100 mL via INTRAVENOUS

## 2020-11-07 ENCOUNTER — Encounter
Admission: RE | Admit: 2020-11-07 | Discharge: 2020-11-07 | Disposition: A | Discharge status: Home / Self Care | Payer: PPO | Source: Ambulatory Visit | Attending: Urology | Admitting: Urology

## 2020-11-07 ENCOUNTER — Other Ambulatory Visit: Payer: Self-pay

## 2020-11-07 ENCOUNTER — Other Ambulatory Visit: Payer: Self-pay | Admitting: Urology

## 2020-11-07 DIAGNOSIS — C61 Malignant neoplasm of prostate: Secondary | ICD-10-CM | POA: Insufficient documentation

## 2020-11-07 DIAGNOSIS — S2241XD Multiple fractures of ribs, right side, subsequent encounter for fracture with routine healing: Secondary | ICD-10-CM | POA: Diagnosis not present

## 2020-11-07 DIAGNOSIS — M1711 Unilateral primary osteoarthritis, right knee: Secondary | ICD-10-CM | POA: Diagnosis not present

## 2020-11-07 DIAGNOSIS — S2231XA Fracture of one rib, right side, initial encounter for closed fracture: Secondary | ICD-10-CM | POA: Diagnosis not present

## 2020-11-07 MED ORDER — TECHNETIUM TC 99M MEDRONATE IV KIT
20.0000 | PACK | Freq: Once | INTRAVENOUS | Status: AC | PRN
  Administered 2020-11-07: 19.7 via INTRAVENOUS

## 2020-11-08 ENCOUNTER — Telehealth: Payer: Self-pay

## 2020-11-08 NOTE — Telephone Encounter (Signed)
-----   Message from Billey Co, MD sent at 11/07/2020  4:45 PM EDT ----- Regarding: results Oki- great news, no evidence of prostate cancer outside the prostate on CT and bone scan. RadOnc referral placed, please schedule follow up with me in 2-3 weeks to finalize treatment plan after seeing radonc.  Sharyn Lull- I placed referral to Rad Onc to discuss radiation treatment options  Nickolas Madrid, MD 11/07/2020

## 2020-11-08 NOTE — Telephone Encounter (Signed)
Called pt informed him of the information below. Pt gave verbal understanding, notes that he has still not decided on course of action. Advised pt that RadOnc appt would provide him with all options and can proceed with decision from there. Pt voiced understanding.

## 2020-11-18 ENCOUNTER — Ambulatory Visit
Admission: RE | Admit: 2020-11-18 | Discharge: 2020-11-18 | Disposition: A | Discharge status: Home / Self Care | Payer: PPO | Source: Ambulatory Visit | Attending: Radiation Oncology | Admitting: Radiation Oncology

## 2020-11-18 VITALS — BP 147/73 | HR 58 | Temp 97.2°F | Wt 224.0 lb

## 2020-11-18 DIAGNOSIS — Z79899 Other long term (current) drug therapy: Secondary | ICD-10-CM | POA: Diagnosis not present

## 2020-11-18 DIAGNOSIS — Z7982 Long term (current) use of aspirin: Secondary | ICD-10-CM | POA: Diagnosis not present

## 2020-11-18 DIAGNOSIS — E785 Hyperlipidemia, unspecified: Secondary | ICD-10-CM | POA: Insufficient documentation

## 2020-11-18 DIAGNOSIS — C61 Malignant neoplasm of prostate: Secondary | ICD-10-CM | POA: Insufficient documentation

## 2020-11-18 DIAGNOSIS — I10 Essential (primary) hypertension: Secondary | ICD-10-CM | POA: Insufficient documentation

## 2020-11-18 NOTE — Consult Note (Signed)
NEW PATIENT EVALUATION  Name: Glenn Watson  MRN: [id number]  Date:   11/18/2020     DOB: May 31, 1946   This 75 y.o. male patient presents to the clinic for initial evaluation of stage IIa (T1 cN0 M0) Gleason 7 (4+3) adenocarcinoma the prostate presenting with a PSA in the 10 range.  REFERRING PHYSICIAN: Idelle Crouch, MD  CHIEF COMPLAINT:  Chief Complaint  Patient presents with  . Consult    DIAGNOSIS: The encounter diagnosis was Prostate cancer ([place]).   PREVIOUS INVESTIGATIONS:  Bone scan and CT scans reviewed Pathology report reviewed Clinical notes reviewed  HPI: Patient is a 75 year old male who is been followed by urology since presenting with an elevated PSA of 4.4 back in November 2018.  He has very little urologic symptoms his PSA did climb to close to 10 and he underwent transrectal ultrasound-guided biopsy showing 4 of 12 cores positive for Gleason 7 (4+3) adenocarcinoma.  Bone scan was negative as well as CT scan of abdomen pelvis showed no evidence to suggest extracapsular extension or pelvic lymphadenopathy.  He is fairly asymptomatic specifically denies any specific lower urinary tract symptoms GI pattern is normal he is having no bone pain or fatigue.  He is otherwise in excellent general condition.  Treatment options have been reviewed with the patient he is now seen for radiation oncology opinion.  PLANNED TREATMENT REGIMEN: Image guided IMRT treatment  PAST MEDICAL HISTORY:  has a past medical history of Anemia, Asthma, Cancer ([place]), Complication of anesthesia, Family history of adverse reaction to anesthesia, Hyperlipidemia, Hypertension, and PONV (postoperative nausea and vomiting).    PAST SURGICAL HISTORY:  Past Surgical History:  Procedure Laterality Date  . APPENDECTOMY  2011   Dr. Bary Castilla  . COLONOSCOPY WITH PROPOFOL N/A 02/10/2017   Procedure: COLONOSCOPY WITH PROPOFOL;  Surgeon: Christene Lye, MD;  Location: [facility] ENDOSCOPY;  Service:  Endoscopy;  Laterality: N/A;  . ORIF TIBIA PLATEAU Left 08/03/2019   Procedure: OPEN REDUCTION INTERNAL FIXATION (ORIF) TIBIAL PLATEAU;  Surgeon: [place] Quivira, MD;  Location: [facility];  Service: Orthopedics;  Laterality: Left;    FAMILY HISTORY: family history is not on file.  SOCIAL HISTORY:  reports that he has never smoked. He has never used smokeless tobacco. He reports current alcohol use of about 35.0 standard drinks of alcohol per week. He reports that he does not use drugs.  ALLERGIES: Patient has no known allergies.  MEDICATIONS:  Current Outpatient Medications  Medication Sig Dispense Refill  . amLODipine-benazepril (LOTREL) 5-10 MG per capsule Take 1 capsule by mouth daily before breakfast.     . aspirin 81 MG tablet Take 81 mg by mouth daily after lunch.    Marland Kitchen CALCIUM PO Take 1,000 mg by mouth daily after lunch.    . Cholecalciferol (VITAMIN D) 2000 UNITS CAPS Take 4,000 Units by mouth daily before breakfast.     . Ferrous Sulfate (IRON) 325 (65 FE) MG TABS Take 325 mg by mouth daily after lunch.     . Milk Thistle 1000 MG CAPS Take 1,000 mg by mouth daily after lunch.    . Multiple Vitamin (MULTIVITAMIN) tablet Take 1 tablet by mouth daily after lunch.     . Omega-3 Fatty Acids (FISH OIL) 1200 MG CAPS Take 1,200 mg by mouth daily before breakfast.    . Saw Palmetto, Serenoa repens, (SAW PALMETTO PO) Take 1,200 mg by mouth daily after lunch.    . tamsulosin (FLOMAX) 0.4 MG CAPS capsule Take 0.4 mg  by mouth daily after lunch.     No current facility-administered medications for this encounter.    ECOG PERFORMANCE STATUS:  0 - Asymptomatic  REVIEW OF SYSTEMS: Patient denies any weight loss, fatigue, weakness, fever, chills or night sweats. Patient denies any loss of vision, blurred vision. Patient denies any ringing  of the ears or hearing loss. No irregular heartbeat. Patient denies heart murmur or history of fainting. Patient denies any chest pain or pain radiating to her upper  extremities. Patient denies any shortness of breath, difficulty breathing at night, cough or hemoptysis. Patient denies any swelling in the lower legs. Patient denies any nausea vomiting, vomiting of blood, or coffee ground material in the vomitus. Patient denies any stomach pain. Patient states has had normal bowel movements no significant constipation or diarrhea. Patient denies any dysuria, hematuria or significant nocturia. Patient denies any problems walking, swelling in the joints or loss of balance. Patient denies any skin changes, loss of hair or loss of weight. Patient denies any excessive worrying or anxiety or significant depression. Patient denies any problems with insomnia. Patient denies excessive thirst, polyuria, polydipsia. Patient denies any swollen glands, patient denies easy bruising or easy bleeding. Patient denies any recent infections, allergies or URI. Patient "s visual fields have not changed significantly in recent time.   PHYSICAL EXAM: BP (!) 147/73   Pulse (!) 58   Temp (!) 97.2 F (36.2 C) (Tympanic)   Wt 224 lb (101.6 kg)   BMI 28.00 kg/m  Well-developed well-nourished patient in NAD. HEENT reveals PERLA, EOMI, discs not visualized.  Oral cavity is clear. No oral mucosal lesions are identified. Neck is clear without evidence of cervical or supraclavicular adenopathy. Lungs are clear to A&P. Cardiac examination is essentially unremarkable with regular rate and rhythm without murmur rub or thrill. Abdomen is benign with no organomegaly or masses noted. Motor sensory and DTR levels are equal and symmetric in the upper and lower extremities. Cranial nerves II through XII are grossly intact. Proprioception is intact. No peripheral adenopathy or edema is identified. No motor or sensory levels are noted. Crude visual fields are within normal range.  LABORATORY DATA: Pathology report reviewed    RADIOLOGY RESULTS: CT scan as well as bone scan reviewed compatible with  above-stated findings   IMPRESSION: Stage IIa adenocarcinoma Gleason 7 (4+3) presenting with a PSA in the 10 range in 75 year old male  PLAN: This time of gone over treatment recommendations including both external beam as well as I-125 interstitial implant.  Patient is interested in external beam treatment.  I would plan on delivering 26 Pearline Cables to his prostate.  I have asked Dr.'s Marina Goodell to place fiducial markers in his prostate for daily image guided treatment.  I have also asked and requested a 20-month Eligard injection for concurrent treatment.  Risks and benefits of treatment including increased lower urinary tract symptoms diarrhea fatigue alteration of blood count skin reaction all were reviewed in detail with the patient and his wife.  I will set up CT simulation after markers are placed.  Patient comprehends my recommendations well.  I would like to take this opportunity to thank you for allowing me to participate in the care of your patient.Noreene Filbert, MD

## 2020-11-20 DIAGNOSIS — L570 Actinic keratosis: Secondary | ICD-10-CM | POA: Diagnosis not present

## 2020-11-20 DIAGNOSIS — C44712 Basal cell carcinoma of skin of right lower limb, including hip: Secondary | ICD-10-CM | POA: Diagnosis not present

## 2020-11-20 DIAGNOSIS — C44519 Basal cell carcinoma of skin of other part of trunk: Secondary | ICD-10-CM | POA: Diagnosis not present

## 2020-11-20 DIAGNOSIS — Z08 Encounter for follow-up examination after completed treatment for malignant neoplasm: Secondary | ICD-10-CM | POA: Diagnosis not present

## 2020-11-20 DIAGNOSIS — D485 Neoplasm of uncertain behavior of skin: Secondary | ICD-10-CM | POA: Diagnosis not present

## 2020-11-20 DIAGNOSIS — X32XXXA Exposure to sunlight, initial encounter: Secondary | ICD-10-CM | POA: Diagnosis not present

## 2020-11-20 DIAGNOSIS — Z86006 Personal history of melanoma in-situ: Secondary | ICD-10-CM | POA: Diagnosis not present

## 2020-11-27 ENCOUNTER — Encounter: Payer: Self-pay | Admitting: Urology

## 2020-11-27 ENCOUNTER — Ambulatory Visit: Payer: PPO | Admitting: Urology

## 2020-11-27 ENCOUNTER — Other Ambulatory Visit: Payer: Self-pay

## 2020-11-27 VITALS — BP 160/79 | HR 67 | Ht 75.0 in | Wt 223.0 lb

## 2020-11-27 DIAGNOSIS — C61 Malignant neoplasm of prostate: Secondary | ICD-10-CM

## 2020-11-27 MED ORDER — CEFTRIAXONE SODIUM 1 G IJ SOLR
1.0000 g | Freq: Once | INTRAMUSCULAR | Status: AC
  Administered 2020-11-27: 1 g via INTRAMUSCULAR

## 2020-11-27 MED ORDER — LEUPROLIDE ACETATE (6 MONTH) 45 MG ~~LOC~~ KIT
45.0000 mg | PACK | Freq: Once | SUBCUTANEOUS | Status: AC
  Administered 2020-11-27: 45 mg via SUBCUTANEOUS

## 2020-11-27 MED ORDER — LEVOFLOXACIN 500 MG PO TABS
500.0000 mg | ORAL_TABLET | Freq: Once | ORAL | Status: AC
  Administered 2020-11-27: 500 mg via ORAL

## 2020-11-27 NOTE — Progress Notes (Signed)
   11/27/20  Indication: Unfavorable intermediate risk prostate cancer  Prostate gold seed marker procedure   Informed consent was obtained, and we discussed the risks of bleeding and infection/sepsis. A time out was performed to ensure correct patient identity.  Pre-Procedure: -Ceftriaxone given for antibiotic prophylaxis  Procedure: -Uncomplicated placement of 3 total gold seed markers, 2 at the bilateral bases, 1 at the midline apex  Post-Procedure: - Patient tolerated the procedure well - He was counseled to seek immediate medical attention if experiences significant bleeding, fevers, or severe pain  Assessment/ Plan: Keep scheduled follow-up for IMRT with Dr. Baruch Gouty 74-month Eligard injection given today(6 mo total) RTC 6 months with me with PSA prior  Nickolas Madrid, MD 11/27/2020

## 2020-11-27 NOTE — Progress Notes (Signed)
Eligard SubQ Injection   Due to Prostate Cancer patient is present today for a Eligard Injection.  Medication: Eligard 6 month Dose: 45 mg  Location: right arm Lot: [id number] Exp: 11/23  Patient tolerated well, no complications were noted  Performed by: Alva Garnet  Per Dr. Diamantina Providence patient is to continue therapy for one time dose . Patient was instructed to start Vitamin D 800-1000iu and Calium 1000-1200mg  daily while on Androgen Deprivation Therapy.

## 2020-11-27 NOTE — Patient Instructions (Signed)
Leuprolide injection What is this medicine? LEUPROLIDE (loo PROE lide) is a man-made hormone. It is used to treat the symptoms of prostate cancer. This medicine may also be used to treat children with early onset of puberty. It may be used for other hormonal conditions. This medicine may be used for other purposes; ask your health care provider or pharmacist if you have questions. COMMON BRAND NAME(S): Lupron What should I tell my health care provider before I take this medicine? They need to know if you have any of these conditions:  diabetes  heart disease or previous heart attack  high blood pressure  high cholesterol  pain or difficulty passing urine  spinal cord metastasis  stroke  tobacco smoker  an unusual or allergic reaction to leuprolide, benzyl alcohol, other medicines, foods, dyes, or preservatives  pregnant or trying to get pregnant  breast-feeding How should I use this medicine? This medicine is for injection under the skin or into a muscle. You will be taught how to prepare and give this medicine. Use exactly as directed. Take your medicine at regular intervals. Do not take your medicine more often than directed. It is important that you put your used needles and syringes in a special sharps container. Do not put them in a trash can. If you do not have a sharps container, call your pharmacist or healthcare provider to get one. A special MedGuide will be given to you by the pharmacist with each prescription and refill. Be sure to read this information carefully each time. Talk to your pediatrician regarding the use of this medicine in children. While this medicine may be prescribed for children as young as 8 years for selected conditions, precautions do apply. Overdosage: If you think you have taken too much of this medicine contact a poison control center or emergency room at once. NOTE: This medicine is only for you. Do not share this medicine with others. What if  I miss a dose? If you miss a dose, take it as soon as you can. If it is almost time for your next dose, take only that dose. Do not take double or extra doses. What may interact with this medicine? Do not take this medicine with any of the following medications:  chasteberry  cisapride  dronedarone  pimozide  thioridazine This medicine may also interact with the following medications:  herbal or dietary supplements, like black cohosh or DHEA  male hormones, like estrogens or progestins and birth control pills, patches, rings, or injections  male hormones, like testosterone  other medicines that prolong the QT interval (abnormal heart rhythm) This list may not describe all possible interactions. Give your health care provider a list of all the medicines, herbs, non-prescription drugs, or dietary supplements you use. Also tell them if you smoke, drink alcohol, or use illegal drugs. Some items may interact with your medicine. What should I watch for while using this medicine? Visit your doctor or health care professional for regular checks on your progress. During the first week, your symptoms may get worse, but then will improve as you continue your treatment. You may get hot flashes, increased bone pain, increased difficulty passing urine, or an aggravation of nerve symptoms. Discuss these effects with your doctor or health care professional, some of them may improve with continued use of this medicine. Male patients may experience a menstrual cycle or spotting during the first 2 months of therapy with this medicine. If this continues, contact your doctor or health care professional.   This medicine may increase blood sugar. Ask your healthcare provider if changes in diet or medicines are needed if you have diabetes. What side effects may I notice from receiving this medicine? Side effects that you should report to your doctor or health care professional as soon as possible:  allergic  reactions like skin rash, itching or hives, swelling of the face, lips, or tongue  breathing problems  chest pain  depression or memory disorders  pain in your legs or groin  pain at site where injected  severe headache  signs and symptoms of high blood sugar such as being more thirsty or hungry or having to urinate more than normal. You may also feel very tired or have blurry vision  swelling of the feet and legs  visual changes  vomiting Side effects that usually do not require medical attention (report to your doctor or health care professional if they continue or are bothersome):  breast swelling or tenderness  decrease in sex drive or performance  diarrhea  hot flashes  loss of appetite  muscle, joint, or bone pains  nausea  redness or irritation at site where injected  skin problems or acne This list may not describe all possible side effects. Call your doctor for medical advice about side effects. You may report side effects to FDA at 1-800-FDA-1088. Where should I keep my medicine? Keep out of the reach of children. Store below 25 degrees C (77 degrees F). Do not freeze. Protect from light. Do not use if it is not clear or if there are particles present. Throw away any unused medicine after the expiration date. NOTE: This sheet is a summary. It may not cover all possible information. If you have questions about this medicine, talk to your doctor, pharmacist, or health care provider.  2021 Elsevier/Gold Standard (2019-05-17 10:57:41)  

## 2020-11-28 ENCOUNTER — Telehealth: Payer: Self-pay

## 2020-11-28 NOTE — Telephone Encounter (Signed)
Benefits investigation faxed in. Response indicates no PA needed.

## 2020-12-03 ENCOUNTER — Ambulatory Visit
Admission: RE | Admit: 2020-12-03 | Discharge: 2020-12-03 | Disposition: A | Discharge status: Home / Self Care | Payer: PPO | Source: Ambulatory Visit | Attending: Radiation Oncology | Admitting: Radiation Oncology

## 2020-12-03 DIAGNOSIS — Z510 Encounter for antineoplastic radiation therapy: Secondary | ICD-10-CM | POA: Insufficient documentation

## 2020-12-03 DIAGNOSIS — C61 Malignant neoplasm of prostate: Secondary | ICD-10-CM | POA: Insufficient documentation

## 2020-12-04 DIAGNOSIS — C44712 Basal cell carcinoma of skin of right lower limb, including hip: Secondary | ICD-10-CM | POA: Diagnosis not present

## 2020-12-04 DIAGNOSIS — C44612 Basal cell carcinoma of skin of right upper limb, including shoulder: Secondary | ICD-10-CM | POA: Diagnosis not present

## 2020-12-06 ENCOUNTER — Other Ambulatory Visit: Payer: Self-pay | Admitting: *Deleted

## 2020-12-06 DIAGNOSIS — C61 Malignant neoplasm of prostate: Secondary | ICD-10-CM

## 2020-12-09 DIAGNOSIS — Z510 Encounter for antineoplastic radiation therapy: Secondary | ICD-10-CM | POA: Diagnosis not present

## 2020-12-09 DIAGNOSIS — C61 Malignant neoplasm of prostate: Secondary | ICD-10-CM | POA: Diagnosis not present

## 2020-12-11 ENCOUNTER — Ambulatory Visit: Admission: RE | Admit: 2020-12-11 | Payer: PPO | Source: Ambulatory Visit

## 2020-12-12 ENCOUNTER — Ambulatory Visit
Admission: RE | Admit: 2020-12-12 | Discharge: 2020-12-12 | Disposition: A | Discharge status: Home / Self Care | Payer: PPO | Source: Ambulatory Visit | Attending: Radiation Oncology | Admitting: Radiation Oncology

## 2020-12-12 DIAGNOSIS — C61 Malignant neoplasm of prostate: Secondary | ICD-10-CM | POA: Diagnosis not present

## 2020-12-12 DIAGNOSIS — Z510 Encounter for antineoplastic radiation therapy: Secondary | ICD-10-CM | POA: Diagnosis not present

## 2020-12-13 ENCOUNTER — Ambulatory Visit
Admission: RE | Admit: 2020-12-13 | Discharge: 2020-12-13 | Disposition: A | Discharge status: Home / Self Care | Payer: PPO | Source: Ambulatory Visit | Attending: Radiation Oncology | Admitting: Radiation Oncology

## 2020-12-13 DIAGNOSIS — Z510 Encounter for antineoplastic radiation therapy: Secondary | ICD-10-CM | POA: Diagnosis not present

## 2020-12-13 DIAGNOSIS — C61 Malignant neoplasm of prostate: Secondary | ICD-10-CM | POA: Diagnosis not present

## 2020-12-16 ENCOUNTER — Ambulatory Visit
Admission: RE | Admit: 2020-12-16 | Discharge: 2020-12-16 | Disposition: A | Discharge status: Home / Self Care | Payer: PPO | Source: Ambulatory Visit | Attending: Radiation Oncology | Admitting: Radiation Oncology

## 2020-12-16 DIAGNOSIS — C61 Malignant neoplasm of prostate: Secondary | ICD-10-CM | POA: Diagnosis not present

## 2020-12-16 DIAGNOSIS — Z510 Encounter for antineoplastic radiation therapy: Secondary | ICD-10-CM | POA: Diagnosis not present

## 2020-12-17 ENCOUNTER — Ambulatory Visit
Admission: RE | Admit: 2020-12-17 | Discharge: 2020-12-17 | Disposition: A | Discharge status: Home / Self Care | Payer: PPO | Source: Ambulatory Visit | Attending: Radiation Oncology | Admitting: Radiation Oncology

## 2020-12-17 DIAGNOSIS — C61 Malignant neoplasm of prostate: Secondary | ICD-10-CM | POA: Diagnosis not present

## 2020-12-17 DIAGNOSIS — Z510 Encounter for antineoplastic radiation therapy: Secondary | ICD-10-CM | POA: Diagnosis not present

## 2020-12-18 ENCOUNTER — Ambulatory Visit
Admission: RE | Admit: 2020-12-18 | Discharge: 2020-12-18 | Disposition: A | Discharge status: Home / Self Care | Payer: PPO | Source: Ambulatory Visit | Attending: Radiation Oncology | Admitting: Radiation Oncology

## 2020-12-18 DIAGNOSIS — C61 Malignant neoplasm of prostate: Secondary | ICD-10-CM | POA: Diagnosis not present

## 2020-12-18 DIAGNOSIS — Z510 Encounter for antineoplastic radiation therapy: Secondary | ICD-10-CM | POA: Diagnosis not present

## 2020-12-19 ENCOUNTER — Ambulatory Visit
Admission: RE | Admit: 2020-12-19 | Discharge: 2020-12-19 | Disposition: A | Discharge status: Home / Self Care | Payer: PPO | Source: Ambulatory Visit | Attending: Radiation Oncology | Admitting: Radiation Oncology

## 2020-12-19 DIAGNOSIS — C61 Malignant neoplasm of prostate: Secondary | ICD-10-CM | POA: Diagnosis not present

## 2020-12-19 DIAGNOSIS — Z510 Encounter for antineoplastic radiation therapy: Secondary | ICD-10-CM | POA: Diagnosis not present

## 2020-12-20 ENCOUNTER — Ambulatory Visit
Admission: RE | Admit: 2020-12-20 | Discharge: 2020-12-20 | Disposition: A | Discharge status: Home / Self Care | Payer: PPO | Source: Ambulatory Visit | Attending: Radiation Oncology | Admitting: Radiation Oncology

## 2020-12-20 DIAGNOSIS — Z510 Encounter for antineoplastic radiation therapy: Secondary | ICD-10-CM | POA: Diagnosis not present

## 2020-12-20 DIAGNOSIS — C61 Malignant neoplasm of prostate: Secondary | ICD-10-CM | POA: Diagnosis not present

## 2020-12-23 ENCOUNTER — Ambulatory Visit
Admission: RE | Admit: 2020-12-23 | Discharge: 2020-12-23 | Disposition: A | Discharge status: Home / Self Care | Payer: PPO | Source: Ambulatory Visit | Attending: Radiation Oncology | Admitting: Radiation Oncology

## 2020-12-23 DIAGNOSIS — Z510 Encounter for antineoplastic radiation therapy: Secondary | ICD-10-CM | POA: Diagnosis not present

## 2020-12-23 DIAGNOSIS — C61 Malignant neoplasm of prostate: Secondary | ICD-10-CM | POA: Diagnosis not present

## 2020-12-24 ENCOUNTER — Ambulatory Visit
Admission: RE | Admit: 2020-12-24 | Discharge: 2020-12-24 | Disposition: A | Discharge status: Home / Self Care | Payer: PPO | Source: Ambulatory Visit | Attending: Radiation Oncology | Admitting: Radiation Oncology

## 2020-12-24 DIAGNOSIS — Z510 Encounter for antineoplastic radiation therapy: Secondary | ICD-10-CM | POA: Diagnosis not present

## 2020-12-24 DIAGNOSIS — C61 Malignant neoplasm of prostate: Secondary | ICD-10-CM | POA: Diagnosis not present

## 2020-12-25 ENCOUNTER — Ambulatory Visit
Admission: RE | Admit: 2020-12-25 | Discharge: 2020-12-25 | Disposition: A | Discharge status: Home / Self Care | Payer: PPO | Source: Ambulatory Visit | Attending: Radiation Oncology | Admitting: Radiation Oncology

## 2020-12-25 DIAGNOSIS — C61 Malignant neoplasm of prostate: Secondary | ICD-10-CM | POA: Diagnosis not present

## 2020-12-25 DIAGNOSIS — Z510 Encounter for antineoplastic radiation therapy: Secondary | ICD-10-CM | POA: Diagnosis not present

## 2020-12-26 ENCOUNTER — Inpatient Hospital Stay: Payer: PPO | Attending: Radiation Oncology

## 2020-12-26 ENCOUNTER — Ambulatory Visit
Admission: RE | Admit: 2020-12-26 | Discharge: 2020-12-26 | Disposition: A | Discharge status: Home / Self Care | Payer: PPO | Source: Ambulatory Visit | Attending: Radiation Oncology | Admitting: Radiation Oncology

## 2020-12-26 DIAGNOSIS — C61 Malignant neoplasm of prostate: Secondary | ICD-10-CM | POA: Diagnosis not present

## 2020-12-26 DIAGNOSIS — Z510 Encounter for antineoplastic radiation therapy: Secondary | ICD-10-CM | POA: Diagnosis not present

## 2020-12-26 LAB — CBC
HCT: 37.1 % — ABNORMAL LOW (ref 39.0–52.0)
Hemoglobin: 12.4 g/dL — ABNORMAL LOW (ref 13.0–17.0)
MCH: 32 pg (ref 26.0–34.0)
MCHC: 33.4 g/dL (ref 30.0–36.0)
MCV: 95.6 fL (ref 80.0–100.0)
Platelets: 209 10*3/uL (ref 150–400)
RBC: 3.88 MIL/uL — ABNORMAL LOW (ref 4.22–5.81)
RDW: 13.1 % (ref 11.5–15.5)
WBC: 5.5 10*3/uL (ref 4.0–10.5)
nRBC: 0 % (ref 0.0–0.2)

## 2020-12-27 ENCOUNTER — Ambulatory Visit
Admission: RE | Admit: 2020-12-27 | Discharge: 2020-12-27 | Disposition: A | Discharge status: Home / Self Care | Payer: PPO | Source: Ambulatory Visit | Attending: Radiation Oncology | Admitting: Radiation Oncology

## 2020-12-27 DIAGNOSIS — C61 Malignant neoplasm of prostate: Secondary | ICD-10-CM | POA: Diagnosis not present

## 2020-12-27 DIAGNOSIS — Z510 Encounter for antineoplastic radiation therapy: Secondary | ICD-10-CM | POA: Diagnosis not present

## 2020-12-31 ENCOUNTER — Ambulatory Visit
Admission: RE | Admit: 2020-12-31 | Discharge: 2020-12-31 | Disposition: A | Discharge status: Home / Self Care | Payer: PPO | Source: Ambulatory Visit | Attending: Radiation Oncology | Admitting: Radiation Oncology

## 2020-12-31 DIAGNOSIS — Z510 Encounter for antineoplastic radiation therapy: Secondary | ICD-10-CM | POA: Diagnosis not present

## 2020-12-31 DIAGNOSIS — C61 Malignant neoplasm of prostate: Secondary | ICD-10-CM | POA: Diagnosis not present

## 2021-01-01 ENCOUNTER — Ambulatory Visit
Admission: RE | Admit: 2021-01-01 | Discharge: 2021-01-01 | Disposition: A | Discharge status: Home / Self Care | Payer: PPO | Source: Ambulatory Visit | Attending: Radiation Oncology | Admitting: Radiation Oncology

## 2021-01-01 DIAGNOSIS — Z510 Encounter for antineoplastic radiation therapy: Secondary | ICD-10-CM | POA: Diagnosis not present

## 2021-01-01 DIAGNOSIS — C61 Malignant neoplasm of prostate: Secondary | ICD-10-CM | POA: Diagnosis not present

## 2021-01-02 ENCOUNTER — Ambulatory Visit
Admission: RE | Admit: 2021-01-02 | Discharge: 2021-01-02 | Disposition: A | Discharge status: Home / Self Care | Payer: PPO | Source: Ambulatory Visit | Attending: Radiation Oncology | Admitting: Radiation Oncology

## 2021-01-02 DIAGNOSIS — C61 Malignant neoplasm of prostate: Secondary | ICD-10-CM | POA: Diagnosis not present

## 2021-01-02 DIAGNOSIS — Z510 Encounter for antineoplastic radiation therapy: Secondary | ICD-10-CM | POA: Diagnosis not present

## 2021-01-03 ENCOUNTER — Ambulatory Visit
Admission: RE | Admit: 2021-01-03 | Discharge: 2021-01-03 | Disposition: A | Discharge status: Home / Self Care | Payer: PPO | Source: Ambulatory Visit | Attending: Radiation Oncology | Admitting: Radiation Oncology

## 2021-01-03 DIAGNOSIS — Z510 Encounter for antineoplastic radiation therapy: Secondary | ICD-10-CM | POA: Diagnosis not present

## 2021-01-03 DIAGNOSIS — C61 Malignant neoplasm of prostate: Secondary | ICD-10-CM | POA: Diagnosis not present

## 2021-01-06 ENCOUNTER — Ambulatory Visit: Payer: PPO

## 2021-01-06 ENCOUNTER — Ambulatory Visit
Admission: RE | Admit: 2021-01-06 | Discharge: 2021-01-06 | Disposition: A | Discharge status: Home / Self Care | Payer: PPO | Source: Ambulatory Visit | Attending: Radiation Oncology | Admitting: Radiation Oncology

## 2021-01-06 DIAGNOSIS — Z510 Encounter for antineoplastic radiation therapy: Secondary | ICD-10-CM | POA: Diagnosis not present

## 2021-01-07 ENCOUNTER — Ambulatory Visit
Admission: RE | Admit: 2021-01-07 | Discharge: 2021-01-07 | Disposition: A | Discharge status: Home / Self Care | Payer: PPO | Source: Ambulatory Visit | Attending: Radiation Oncology | Admitting: Radiation Oncology

## 2021-01-07 DIAGNOSIS — C61 Malignant neoplasm of prostate: Secondary | ICD-10-CM | POA: Diagnosis not present

## 2021-01-07 DIAGNOSIS — Z510 Encounter for antineoplastic radiation therapy: Secondary | ICD-10-CM | POA: Diagnosis not present

## 2021-01-08 ENCOUNTER — Ambulatory Visit
Admission: RE | Admit: 2021-01-08 | Discharge: 2021-01-08 | Disposition: A | Discharge status: Home / Self Care | Payer: PPO | Source: Ambulatory Visit | Attending: Radiation Oncology | Admitting: Radiation Oncology

## 2021-01-08 DIAGNOSIS — C61 Malignant neoplasm of prostate: Secondary | ICD-10-CM | POA: Diagnosis not present

## 2021-01-08 DIAGNOSIS — Z510 Encounter for antineoplastic radiation therapy: Secondary | ICD-10-CM | POA: Diagnosis not present

## 2021-01-09 ENCOUNTER — Inpatient Hospital Stay: Payer: PPO | Attending: Radiation Oncology

## 2021-01-09 ENCOUNTER — Ambulatory Visit
Admission: RE | Admit: 2021-01-09 | Discharge: 2021-01-09 | Disposition: A | Discharge status: Home / Self Care | Payer: PPO | Source: Ambulatory Visit | Attending: Radiation Oncology | Admitting: Radiation Oncology

## 2021-01-09 DIAGNOSIS — C61 Malignant neoplasm of prostate: Secondary | ICD-10-CM | POA: Diagnosis not present

## 2021-01-09 DIAGNOSIS — Z510 Encounter for antineoplastic radiation therapy: Secondary | ICD-10-CM | POA: Diagnosis not present

## 2021-01-09 LAB — CBC
HCT: 39.4 % (ref 39.0–52.0)
Hemoglobin: 13.3 g/dL (ref 13.0–17.0)
MCH: 31.7 pg (ref 26.0–34.0)
MCHC: 33.8 g/dL (ref 30.0–36.0)
MCV: 94 fL (ref 80.0–100.0)
Platelets: 226 10*3/uL (ref 150–400)
RBC: 4.19 MIL/uL — ABNORMAL LOW (ref 4.22–5.81)
RDW: 13 % (ref 11.5–15.5)
WBC: 5.1 10*3/uL (ref 4.0–10.5)
nRBC: 0 % (ref 0.0–0.2)

## 2021-01-10 ENCOUNTER — Ambulatory Visit
Admission: RE | Admit: 2021-01-10 | Discharge: 2021-01-10 | Disposition: A | Discharge status: Home / Self Care | Payer: PPO | Source: Ambulatory Visit | Attending: Radiation Oncology | Admitting: Radiation Oncology

## 2021-01-10 DIAGNOSIS — C61 Malignant neoplasm of prostate: Secondary | ICD-10-CM | POA: Diagnosis not present

## 2021-01-10 DIAGNOSIS — Z510 Encounter for antineoplastic radiation therapy: Secondary | ICD-10-CM | POA: Diagnosis not present

## 2021-01-13 ENCOUNTER — Ambulatory Visit
Admission: RE | Admit: 2021-01-13 | Discharge: 2021-01-13 | Disposition: A | Discharge status: Home / Self Care | Payer: PPO | Source: Ambulatory Visit | Attending: Radiation Oncology | Admitting: Radiation Oncology

## 2021-01-13 DIAGNOSIS — Z510 Encounter for antineoplastic radiation therapy: Secondary | ICD-10-CM | POA: Diagnosis not present

## 2021-01-13 DIAGNOSIS — C61 Malignant neoplasm of prostate: Secondary | ICD-10-CM | POA: Diagnosis not present

## 2021-01-14 ENCOUNTER — Ambulatory Visit
Admission: RE | Admit: 2021-01-14 | Discharge: 2021-01-14 | Disposition: A | Discharge status: Home / Self Care | Payer: PPO | Source: Ambulatory Visit | Attending: Radiation Oncology | Admitting: Radiation Oncology

## 2021-01-14 DIAGNOSIS — C61 Malignant neoplasm of prostate: Secondary | ICD-10-CM | POA: Diagnosis not present

## 2021-01-14 DIAGNOSIS — Z510 Encounter for antineoplastic radiation therapy: Secondary | ICD-10-CM | POA: Diagnosis not present

## 2021-01-15 ENCOUNTER — Ambulatory Visit
Admission: RE | Admit: 2021-01-15 | Discharge: 2021-01-15 | Disposition: A | Discharge status: Home / Self Care | Payer: PPO | Source: Ambulatory Visit | Attending: Radiation Oncology | Admitting: Radiation Oncology

## 2021-01-15 DIAGNOSIS — C61 Malignant neoplasm of prostate: Secondary | ICD-10-CM | POA: Diagnosis not present

## 2021-01-15 DIAGNOSIS — Z510 Encounter for antineoplastic radiation therapy: Secondary | ICD-10-CM | POA: Diagnosis not present

## 2021-01-16 ENCOUNTER — Ambulatory Visit
Admission: RE | Admit: 2021-01-16 | Discharge: 2021-01-16 | Disposition: A | Discharge status: Home / Self Care | Payer: PPO | Source: Ambulatory Visit | Attending: Radiation Oncology | Admitting: Radiation Oncology

## 2021-01-16 DIAGNOSIS — C61 Malignant neoplasm of prostate: Secondary | ICD-10-CM | POA: Diagnosis not present

## 2021-01-16 DIAGNOSIS — Z510 Encounter for antineoplastic radiation therapy: Secondary | ICD-10-CM | POA: Diagnosis not present

## 2021-01-17 ENCOUNTER — Ambulatory Visit
Admission: RE | Admit: 2021-01-17 | Discharge: 2021-01-17 | Disposition: A | Discharge status: Home / Self Care | Payer: PPO | Source: Ambulatory Visit | Attending: Radiation Oncology | Admitting: Radiation Oncology

## 2021-01-17 DIAGNOSIS — C61 Malignant neoplasm of prostate: Secondary | ICD-10-CM | POA: Diagnosis not present

## 2021-01-17 DIAGNOSIS — Z510 Encounter for antineoplastic radiation therapy: Secondary | ICD-10-CM | POA: Diagnosis not present

## 2021-01-20 ENCOUNTER — Ambulatory Visit
Admission: RE | Admit: 2021-01-20 | Discharge: 2021-01-20 | Disposition: A | Discharge status: Home / Self Care | Payer: PPO | Source: Ambulatory Visit | Attending: Radiation Oncology | Admitting: Radiation Oncology

## 2021-01-20 DIAGNOSIS — Z510 Encounter for antineoplastic radiation therapy: Secondary | ICD-10-CM | POA: Diagnosis not present

## 2021-01-20 DIAGNOSIS — C61 Malignant neoplasm of prostate: Secondary | ICD-10-CM | POA: Diagnosis not present

## 2021-01-21 ENCOUNTER — Ambulatory Visit
Admission: RE | Admit: 2021-01-21 | Discharge: 2021-01-21 | Disposition: A | Discharge status: Home / Self Care | Payer: PPO | Source: Ambulatory Visit | Attending: Radiation Oncology | Admitting: Radiation Oncology

## 2021-01-21 DIAGNOSIS — Z510 Encounter for antineoplastic radiation therapy: Secondary | ICD-10-CM | POA: Diagnosis not present

## 2021-01-21 DIAGNOSIS — C61 Malignant neoplasm of prostate: Secondary | ICD-10-CM | POA: Diagnosis not present

## 2021-01-22 ENCOUNTER — Ambulatory Visit
Admission: RE | Admit: 2021-01-22 | Discharge: 2021-01-22 | Disposition: A | Discharge status: Home / Self Care | Payer: PPO | Source: Ambulatory Visit | Attending: Radiation Oncology | Admitting: Radiation Oncology

## 2021-01-22 DIAGNOSIS — C61 Malignant neoplasm of prostate: Secondary | ICD-10-CM | POA: Diagnosis not present

## 2021-01-22 DIAGNOSIS — Z510 Encounter for antineoplastic radiation therapy: Secondary | ICD-10-CM | POA: Diagnosis not present

## 2021-01-23 ENCOUNTER — Inpatient Hospital Stay: Payer: PPO

## 2021-01-23 ENCOUNTER — Ambulatory Visit
Admission: RE | Admit: 2021-01-23 | Discharge: 2021-01-23 | Disposition: A | Discharge status: Home / Self Care | Payer: PPO | Source: Ambulatory Visit | Attending: Radiation Oncology | Admitting: Radiation Oncology

## 2021-01-23 DIAGNOSIS — Z510 Encounter for antineoplastic radiation therapy: Secondary | ICD-10-CM | POA: Diagnosis not present

## 2021-01-23 DIAGNOSIS — C61 Malignant neoplasm of prostate: Secondary | ICD-10-CM | POA: Diagnosis not present

## 2021-01-23 LAB — CBC
HCT: 36.5 % — ABNORMAL LOW (ref 39.0–52.0)
Hemoglobin: 12.3 g/dL — ABNORMAL LOW (ref 13.0–17.0)
MCH: 32 pg (ref 26.0–34.0)
MCHC: 33.7 g/dL (ref 30.0–36.0)
MCV: 95.1 fL (ref 80.0–100.0)
Platelets: 229 10*3/uL (ref 150–400)
RBC: 3.84 MIL/uL — ABNORMAL LOW (ref 4.22–5.81)
RDW: 13.2 % (ref 11.5–15.5)
WBC: 4.9 10*3/uL (ref 4.0–10.5)
nRBC: 0 % (ref 0.0–0.2)

## 2021-01-24 ENCOUNTER — Ambulatory Visit: Payer: PPO

## 2021-01-27 ENCOUNTER — Ambulatory Visit
Admission: RE | Admit: 2021-01-27 | Discharge: 2021-01-27 | Disposition: A | Discharge status: Home / Self Care | Payer: PPO | Source: Ambulatory Visit | Attending: Radiation Oncology | Admitting: Radiation Oncology

## 2021-01-27 DIAGNOSIS — C61 Malignant neoplasm of prostate: Secondary | ICD-10-CM | POA: Insufficient documentation

## 2021-01-27 DIAGNOSIS — Z510 Encounter for antineoplastic radiation therapy: Secondary | ICD-10-CM | POA: Insufficient documentation

## 2021-01-28 ENCOUNTER — Ambulatory Visit
Admission: RE | Admit: 2021-01-28 | Discharge: 2021-01-28 | Disposition: A | Discharge status: Home / Self Care | Payer: PPO | Source: Ambulatory Visit | Attending: Radiation Oncology | Admitting: Radiation Oncology

## 2021-01-28 DIAGNOSIS — C61 Malignant neoplasm of prostate: Secondary | ICD-10-CM | POA: Diagnosis not present

## 2021-01-28 DIAGNOSIS — Z510 Encounter for antineoplastic radiation therapy: Secondary | ICD-10-CM | POA: Diagnosis not present

## 2021-01-29 ENCOUNTER — Ambulatory Visit
Admission: RE | Admit: 2021-01-29 | Discharge: 2021-01-29 | Disposition: A | Discharge status: Home / Self Care | Payer: PPO | Source: Ambulatory Visit | Attending: Radiation Oncology | Admitting: Radiation Oncology

## 2021-01-29 DIAGNOSIS — Z510 Encounter for antineoplastic radiation therapy: Secondary | ICD-10-CM | POA: Diagnosis not present

## 2021-01-29 DIAGNOSIS — C61 Malignant neoplasm of prostate: Secondary | ICD-10-CM | POA: Diagnosis not present

## 2021-01-30 ENCOUNTER — Ambulatory Visit
Admission: RE | Admit: 2021-01-30 | Discharge: 2021-01-30 | Disposition: A | Discharge status: Home / Self Care | Payer: PPO | Source: Ambulatory Visit | Attending: Radiation Oncology | Admitting: Radiation Oncology

## 2021-01-30 DIAGNOSIS — Z510 Encounter for antineoplastic radiation therapy: Secondary | ICD-10-CM | POA: Diagnosis not present

## 2021-01-30 DIAGNOSIS — C61 Malignant neoplasm of prostate: Secondary | ICD-10-CM | POA: Diagnosis not present

## 2021-01-31 ENCOUNTER — Ambulatory Visit
Admission: RE | Admit: 2021-01-31 | Discharge: 2021-01-31 | Disposition: A | Discharge status: Home / Self Care | Payer: PPO | Source: Ambulatory Visit | Attending: Radiation Oncology | Admitting: Radiation Oncology

## 2021-01-31 DIAGNOSIS — C61 Malignant neoplasm of prostate: Secondary | ICD-10-CM | POA: Diagnosis not present

## 2021-01-31 DIAGNOSIS — Z510 Encounter for antineoplastic radiation therapy: Secondary | ICD-10-CM | POA: Diagnosis not present

## 2021-02-03 ENCOUNTER — Ambulatory Visit
Admission: RE | Admit: 2021-02-03 | Discharge: 2021-02-03 | Disposition: A | Discharge status: Home / Self Care | Payer: PPO | Source: Ambulatory Visit | Attending: Radiation Oncology | Admitting: Radiation Oncology

## 2021-02-03 DIAGNOSIS — Z510 Encounter for antineoplastic radiation therapy: Secondary | ICD-10-CM | POA: Diagnosis not present

## 2021-02-03 DIAGNOSIS — C61 Malignant neoplasm of prostate: Secondary | ICD-10-CM | POA: Diagnosis not present

## 2021-02-04 ENCOUNTER — Ambulatory Visit
Admission: RE | Admit: 2021-02-04 | Discharge: 2021-02-04 | Disposition: A | Discharge status: Home / Self Care | Payer: PPO | Source: Ambulatory Visit | Attending: Radiation Oncology | Admitting: Radiation Oncology

## 2021-02-04 DIAGNOSIS — C61 Malignant neoplasm of prostate: Secondary | ICD-10-CM | POA: Diagnosis not present

## 2021-02-04 DIAGNOSIS — Z510 Encounter for antineoplastic radiation therapy: Secondary | ICD-10-CM | POA: Diagnosis not present

## 2021-02-05 ENCOUNTER — Ambulatory Visit
Admission: RE | Admit: 2021-02-05 | Discharge: 2021-02-05 | Disposition: A | Discharge status: Home / Self Care | Payer: PPO | Source: Ambulatory Visit | Attending: Radiation Oncology | Admitting: Radiation Oncology

## 2021-02-05 DIAGNOSIS — C61 Malignant neoplasm of prostate: Secondary | ICD-10-CM | POA: Diagnosis not present

## 2021-02-05 DIAGNOSIS — Z510 Encounter for antineoplastic radiation therapy: Secondary | ICD-10-CM | POA: Diagnosis not present

## 2021-02-06 ENCOUNTER — Ambulatory Visit: Payer: PPO

## 2021-02-06 ENCOUNTER — Ambulatory Visit
Admission: RE | Admit: 2021-02-06 | Discharge: 2021-02-06 | Disposition: A | Discharge status: Home / Self Care | Payer: PPO | Source: Ambulatory Visit | Attending: Radiation Oncology | Admitting: Radiation Oncology

## 2021-02-06 DIAGNOSIS — C61 Malignant neoplasm of prostate: Secondary | ICD-10-CM | POA: Diagnosis not present

## 2021-02-06 DIAGNOSIS — Z510 Encounter for antineoplastic radiation therapy: Secondary | ICD-10-CM | POA: Diagnosis not present

## 2021-02-07 ENCOUNTER — Ambulatory Visit
Admission: RE | Admit: 2021-02-07 | Discharge: 2021-02-07 | Disposition: A | Discharge status: Home / Self Care | Payer: PPO | Source: Ambulatory Visit | Attending: Radiation Oncology | Admitting: Radiation Oncology

## 2021-02-07 ENCOUNTER — Ambulatory Visit: Payer: PPO

## 2021-02-07 DIAGNOSIS — Z510 Encounter for antineoplastic radiation therapy: Secondary | ICD-10-CM | POA: Diagnosis not present

## 2021-02-07 DIAGNOSIS — C61 Malignant neoplasm of prostate: Secondary | ICD-10-CM | POA: Diagnosis not present

## 2021-02-11 ENCOUNTER — Ambulatory Visit: Payer: PPO | Attending: Internal Medicine

## 2021-02-11 ENCOUNTER — Other Ambulatory Visit: Payer: Self-pay

## 2021-02-11 DIAGNOSIS — Z23 Encounter for immunization: Secondary | ICD-10-CM

## 2021-02-11 MED ORDER — PFIZER-BIONT COVID-19 VAC-TRIS 30 MCG/0.3ML IM SUSP
INTRAMUSCULAR | 0 refills | Status: AC
  Filled 2021-02-11: qty 0.3, 1d supply, fill #0

## 2021-02-11 NOTE — Progress Notes (Signed)
   Covid-19 Vaccination Clinic  Name:  Glenn Watson    MRN: [id number] DOB: 04-18-1946  02/11/2021  Mr. Glenn Watson was observed post Covid-19 immunization for 15 minutes without incident. He was provided with Vaccine Information Sheet and instruction to access the V-Safe system.   Mr. Glenn Watson was instructed to call 911 with any severe reactions post vaccine: Difficulty breathing  Swelling of face and throat  A fast heartbeat  A bad rash all over body  Dizziness and weakness   Immunizations Administered     Name Date Dose VIS Date Route   PFIZER Comrnaty(Gray TOP) Covid-19 Vaccine 02/11/2021  1:27 PM 0.3 mL 06/06/2020 Intramuscular   Manufacturer: [employer]   Lot: [id number]   NDC: [place], PharmD, MBA Clinical Acute Care Pharmacist

## 2021-02-13 DIAGNOSIS — E7800 Pure hypercholesterolemia, unspecified: Secondary | ICD-10-CM | POA: Diagnosis not present

## 2021-02-13 DIAGNOSIS — R7309 Other abnormal glucose: Secondary | ICD-10-CM | POA: Diagnosis not present

## 2021-02-13 DIAGNOSIS — Z79899 Other long term (current) drug therapy: Secondary | ICD-10-CM | POA: Diagnosis not present

## 2021-02-13 DIAGNOSIS — Z125 Encounter for screening for malignant neoplasm of prostate: Secondary | ICD-10-CM | POA: Diagnosis not present

## 2021-02-25 ENCOUNTER — Encounter: Payer: Self-pay | Admitting: Radiation Oncology

## 2021-03-04 DIAGNOSIS — C61 Malignant neoplasm of prostate: Secondary | ICD-10-CM | POA: Insufficient documentation

## 2021-03-04 DIAGNOSIS — E7800 Pure hypercholesterolemia, unspecified: Secondary | ICD-10-CM | POA: Diagnosis not present

## 2021-03-04 DIAGNOSIS — Z0000 Encounter for general adult medical examination without abnormal findings: Secondary | ICD-10-CM | POA: Diagnosis not present

## 2021-03-04 DIAGNOSIS — I10 Essential (primary) hypertension: Secondary | ICD-10-CM | POA: Diagnosis not present

## 2021-03-04 DIAGNOSIS — D649 Anemia, unspecified: Secondary | ICD-10-CM | POA: Diagnosis not present

## 2021-03-11 DIAGNOSIS — H40003 Preglaucoma, unspecified, bilateral: Secondary | ICD-10-CM | POA: Diagnosis not present

## 2021-03-19 ENCOUNTER — Encounter: Payer: Self-pay | Admitting: Radiation Oncology

## 2021-03-19 ENCOUNTER — Ambulatory Visit
Admission: RE | Admit: 2021-03-19 | Discharge: 2021-03-19 | Disposition: A | Discharge status: Home / Self Care | Payer: PPO | Source: Ambulatory Visit | Attending: Radiation Oncology | Admitting: Radiation Oncology

## 2021-03-19 VITALS — BP 141/79 | HR 59 | Temp 97.2°F | Resp 16 | Wt 219.4 lb

## 2021-03-19 DIAGNOSIS — C61 Malignant neoplasm of prostate: Secondary | ICD-10-CM | POA: Insufficient documentation

## 2021-03-19 DIAGNOSIS — Z923 Personal history of irradiation: Secondary | ICD-10-CM | POA: Insufficient documentation

## 2021-03-19 NOTE — Progress Notes (Signed)
Radiation Oncology Follow up Note  Name: Glenn Watson   Date:   03/19/2021 MRN:  [id number] DOB: 24-Jun-1946    This 75 y.o. male presents to the clinic today for 1 month follow-up status post image guided IMRT radiation therapy for stage IIa (T1 cN0 M0) Gleason 7 (4+3) adenocarcinoma the prostate presenting with a PSA in the 10 range.  REFERRING PROVIDER: Idelle Crouch, MD  HPI: Patient is a 75 year old male now at 1 month having completed image guided IMRT radiation therapy for Gleason 7 adenocarcinoma the prostate presenting the 10 range.  He is seen today in routine follow-up is doing well.  He specifically denies any increased lower urinary tract symptoms diarrhea or fatigue..  COMPLICATIONS OF TREATMENT: none  FOLLOW UP COMPLIANCE: keeps appointments   PHYSICAL EXAM:  BP (!) 141/79 (BP Location: Left Arm, Patient Position: Sitting)   Pulse (!) 59   Temp (!) 97.2 F (36.2 C) (Tympanic)   Resp 16   Wt 219 lb 6.4 oz (99.5 kg)   BMI 27.42 kg/m  Well-developed well-nourished patient in NAD. HEENT reveals PERLA, EOMI, discs not visualized.  Oral cavity is clear. No oral mucosal lesions are identified. Neck is clear without evidence of cervical or supraclavicular adenopathy. Lungs are clear to A&P. Cardiac examination is essentially unremarkable with regular rate and rhythm without murmur rub or thrill. Abdomen is benign with no organomegaly or masses noted. Motor sensory and DTR levels are equal and symmetric in the upper and lower extremities. Cranial nerves II through XII are grossly intact. Proprioception is intact. No peripheral adenopathy or edema is identified. No motor or sensory levels are noted. Crude visual fields are within normal range.  RADIOLOGY RESULTS: No current films to review  PLAN: Present time patient is doing well 1 month out from IMRT radiation therapy with very low side effect profile.  On pleased with his overall progress.  I have asked to see him back in  3 months for follow-up he will have a PSA at that time.  Patient knows to call with any concerns.  I would like to take this opportunity to thank you for allowing me to participate in the care of your patient.Noreene Filbert, MD

## 2021-04-25 DIAGNOSIS — L309 Dermatitis, unspecified: Secondary | ICD-10-CM | POA: Diagnosis not present

## 2021-04-25 DIAGNOSIS — L439 Lichen planus, unspecified: Secondary | ICD-10-CM | POA: Diagnosis not present

## 2021-05-02 DIAGNOSIS — L309 Dermatitis, unspecified: Secondary | ICD-10-CM | POA: Diagnosis not present

## 2021-05-27 ENCOUNTER — Other Ambulatory Visit: Payer: Self-pay

## 2021-05-27 DIAGNOSIS — C61 Malignant neoplasm of prostate: Secondary | ICD-10-CM

## 2021-05-27 NOTE — Progress Notes (Signed)
psa

## 2021-05-29 ENCOUNTER — Ambulatory Visit: Payer: Self-pay | Admitting: Urology

## 2021-05-30 DIAGNOSIS — Z85828 Personal history of other malignant neoplasm of skin: Secondary | ICD-10-CM | POA: Diagnosis not present

## 2021-05-30 DIAGNOSIS — C44629 Squamous cell carcinoma of skin of left upper limb, including shoulder: Secondary | ICD-10-CM | POA: Diagnosis not present

## 2021-05-30 DIAGNOSIS — Z85820 Personal history of malignant melanoma of skin: Secondary | ICD-10-CM | POA: Diagnosis not present

## 2021-05-30 DIAGNOSIS — D485 Neoplasm of uncertain behavior of skin: Secondary | ICD-10-CM | POA: Diagnosis not present

## 2021-05-30 DIAGNOSIS — D2262 Melanocytic nevi of left upper limb, including shoulder: Secondary | ICD-10-CM | POA: Diagnosis not present

## 2021-05-30 DIAGNOSIS — D2261 Melanocytic nevi of right upper limb, including shoulder: Secondary | ICD-10-CM | POA: Diagnosis not present

## 2021-05-30 DIAGNOSIS — D225 Melanocytic nevi of trunk: Secondary | ICD-10-CM | POA: Diagnosis not present

## 2021-06-02 ENCOUNTER — Other Ambulatory Visit: Payer: PPO

## 2021-06-02 ENCOUNTER — Other Ambulatory Visit: Payer: Self-pay

## 2021-06-02 DIAGNOSIS — C61 Malignant neoplasm of prostate: Secondary | ICD-10-CM | POA: Diagnosis not present

## 2021-06-03 LAB — CBC WITH DIFFERENTIAL/PLATELET
Basophils Absolute: 0 10*3/uL (ref 0.0–0.2)
Basos: 1 %
EOS (ABSOLUTE): 0.2 10*3/uL (ref 0.0–0.4)
Eos: 3 %
Hematocrit: 36.3 % — ABNORMAL LOW (ref 37.5–51.0)
Hemoglobin: 12.5 g/dL — ABNORMAL LOW (ref 13.0–17.7)
Immature Grans (Abs): 0 10*3/uL (ref 0.0–0.1)
Immature Granulocytes: 0 %
Lymphocytes Absolute: 1 10*3/uL (ref 0.7–3.1)
Lymphs: 22 %
MCH: 31.8 pg (ref 26.6–33.0)
MCHC: 34.4 g/dL (ref 31.5–35.7)
MCV: 92 fL (ref 79–97)
Monocytes Absolute: 0.4 10*3/uL (ref 0.1–0.9)
Monocytes: 10 %
Neutrophils Absolute: 3 10*3/uL (ref 1.4–7.0)
Neutrophils: 64 %
Platelets: 215 10*3/uL (ref 150–450)
RBC: 3.93 x10E6/uL — ABNORMAL LOW (ref 4.14–5.80)
RDW: 12.4 % (ref 11.6–15.4)
WBC: 4.6 10*3/uL (ref 3.4–10.8)

## 2021-06-03 LAB — PSA: Prostate Specific Ag, Serum: <0.1 ng/mL (ref 0.0–4.0)

## 2021-06-05 ENCOUNTER — Ambulatory Visit (INDEPENDENT_AMBULATORY_CARE_PROVIDER_SITE_OTHER): Payer: PPO | Admitting: Urology

## 2021-06-05 ENCOUNTER — Encounter: Payer: Self-pay | Admitting: Urology

## 2021-06-05 ENCOUNTER — Other Ambulatory Visit: Payer: Self-pay

## 2021-06-05 VITALS — BP 131/69 | HR 62 | Ht 75.0 in | Wt 221.0 lb

## 2021-06-05 DIAGNOSIS — C61 Malignant neoplasm of prostate: Secondary | ICD-10-CM | POA: Diagnosis not present

## 2021-06-05 DIAGNOSIS — N529 Male erectile dysfunction, unspecified: Secondary | ICD-10-CM | POA: Diagnosis not present

## 2021-06-05 NOTE — Progress Notes (Signed)
   06/05/2021 1:15 PM   MARIUS BETTS [birth year] [id number]  Reason for visit: Follow up prostate cancer  HPI: 75 year old very healthy male who had an elevated PSA of 9.9 and prostate biopsy in April 2022 showed a 40 g prostate with 4/12 cores positive for unfavorable intermediate risk prostate cancer with max core involvement of 92% with significant cribriform features.  He underwent 71-month ADT and gold seed marker placement on 11/27/2020, followed by IMRT with Dr. Baruch Gouty with radiation oncology.  He has done very well since treatment, and denies any significant urinary symptoms at this time or gross hematuria.  He continues on the Flomax.  He has had ED and not been sexually active since the ADT injection.  He is not interested in trying PDE 5 inhibitors at this time, but may be in the future.  Most recent PSA 06/02/2021 was undetectable.  RTC 6 months PSA prior Consider Cialis in the future if persistent ED  Billey Co, [place] [address] [place], [place] [address] [phone]

## 2021-06-12 ENCOUNTER — Ambulatory Visit
Admission: RE | Admit: 2021-06-12 | Discharge: 2021-06-12 | Disposition: A | Discharge status: Home / Self Care | Payer: PPO | Source: Ambulatory Visit | Attending: Radiation Oncology | Admitting: Radiation Oncology

## 2021-06-12 ENCOUNTER — Encounter: Payer: Self-pay | Admitting: Radiation Oncology

## 2021-06-12 ENCOUNTER — Other Ambulatory Visit: Payer: Self-pay

## 2021-06-12 VITALS — BP 122/61 | HR 66 | Temp 97.9°F | Wt 225.6 lb

## 2021-06-12 DIAGNOSIS — C61 Malignant neoplasm of prostate: Secondary | ICD-10-CM | POA: Insufficient documentation

## 2021-06-12 DIAGNOSIS — Z923 Personal history of irradiation: Secondary | ICD-10-CM | POA: Insufficient documentation

## 2021-06-12 DIAGNOSIS — Z08 Encounter for follow-up examination after completed treatment for malignant neoplasm: Secondary | ICD-10-CM | POA: Diagnosis not present

## 2021-06-12 NOTE — Progress Notes (Signed)
Radiation Oncology Follow up Note  Name: Glenn Watson   Date:   06/12/2021 MRN:  [id number] DOB: 12/20/1945    This 75 y.o. male presents to the clinic today for 72-month follow-up status post image guided IMRT radiation therapy to his prostate for Gleason 7 (4+3) adenocarcinoma presenting with a PSA in the 10 range.  REFERRING PROVIDER: Idelle Crouch, MD  HPI: Patient is a 75 year old male now out 4 months having completed IMRT radiation therapy to his prostate for stage IIa Gleason 7 adenocarcinoma the prostate.  Seen today in routine follow-up he is doing well.  He specifically denies any increased lower urinary tract symptoms diarrhea or fatigue.  His most recent PSA is less than 0.1.Marland Kitchen  COMPLICATIONS OF TREATMENT: none  FOLLOW UP COMPLIANCE: keeps appointments   PHYSICAL EXAM:  BP 122/61    Pulse 66    Temp 97.9 F (36.6 C) (Tympanic)    Wt 225 lb 9.6 oz (102.3 kg)    BMI 28.20 kg/m  Well-developed well-nourished patient in NAD. HEENT reveals PERLA, EOMI, discs not visualized.  Oral cavity is clear. No oral mucosal lesions are identified. Neck is clear without evidence of cervical or supraclavicular adenopathy. Lungs are clear to A&P. Cardiac examination is essentially unremarkable with regular rate and rhythm without murmur rub or thrill. Abdomen is benign with no organomegaly or masses noted. Motor sensory and DTR levels are equal and symmetric in the upper and lower extremities. Cranial nerves II through XII are grossly intact. Proprioception is intact. No peripheral adenopathy or edema is identified. No motor or sensory levels are noted. Crude visual fields are within normal range.  RADIOLOGY RESULTS: No current films for review  PLAN: Present time patient is doing well her excellent biochemical control of his prostate cancer.  And pleased with his overall progress.  I have asked to see him back in 6 months with a repeat PSA at that time.  Patient knows to call with any  concerns.  I would like to take this opportunity to thank you for allowing me to participate in the care of your patient.Noreene Filbert, MD

## 2021-06-29 IMAGING — CT CT TIBIA FIBULA *L* W/O CM
2 of 4 series · 8 of 35 positions shown, 10 images · non-contrast
Comparison: CT scan dated 04/07/2019

CLINICAL DATA: Follow-up of closed displaced transverse fracture of
the shaft of the left tibia.

EXAM:
CT OF THE LOWER LEFT EXTREMITY WITHOUT CONTRAST
TECHNIQUE: Multidetector CT imaging of the lower left extremity was performed
according to the standard protocol.

[Series 2: axial bone lfov lower extremity 1.50 ax · axial · 0.29mm/px · z∈[-2158,-1841]mm · 5 of 607 slices shown, 7 images]
[im 102/607  soft-tissue]
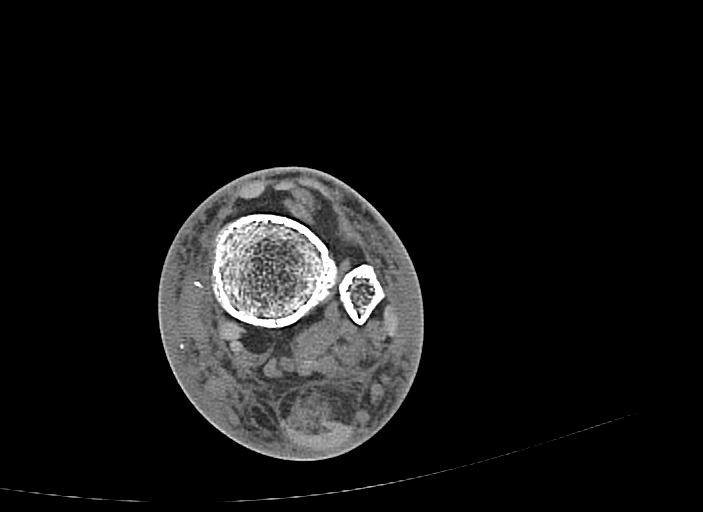
[im 102/607  bone]
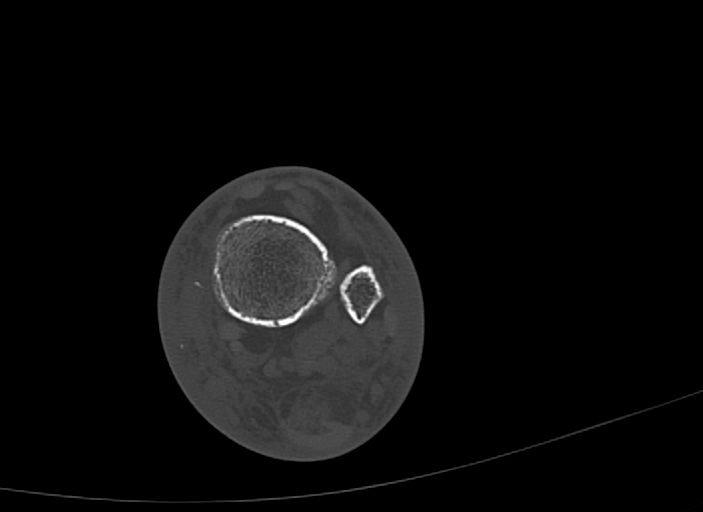
[im 203/607  bone]
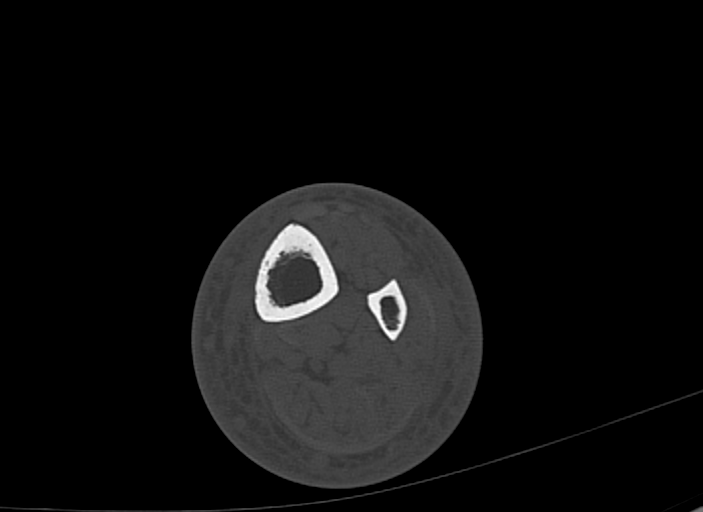
[im 304/607  bone]
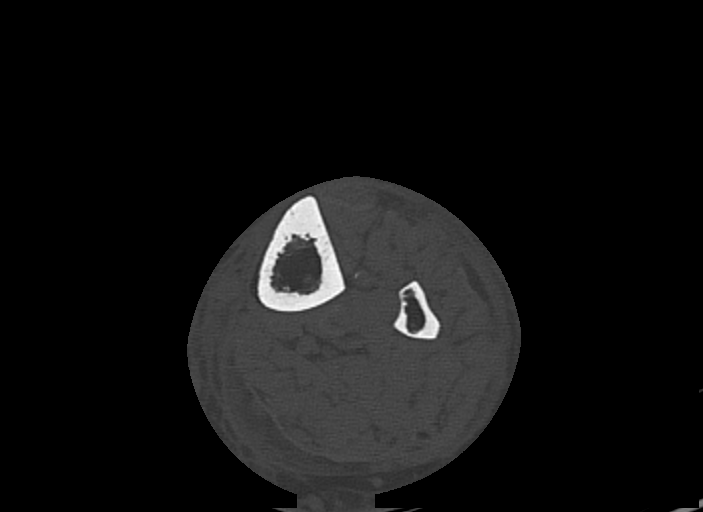
[im 405/607  bone]
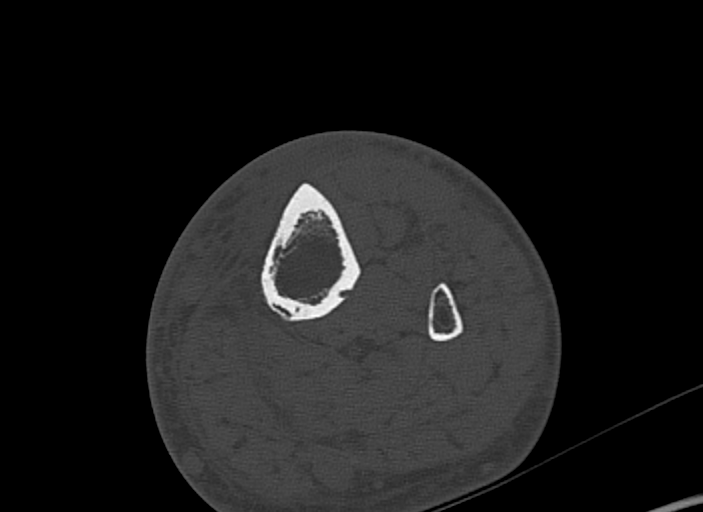
[im 506/607  soft-tissue]
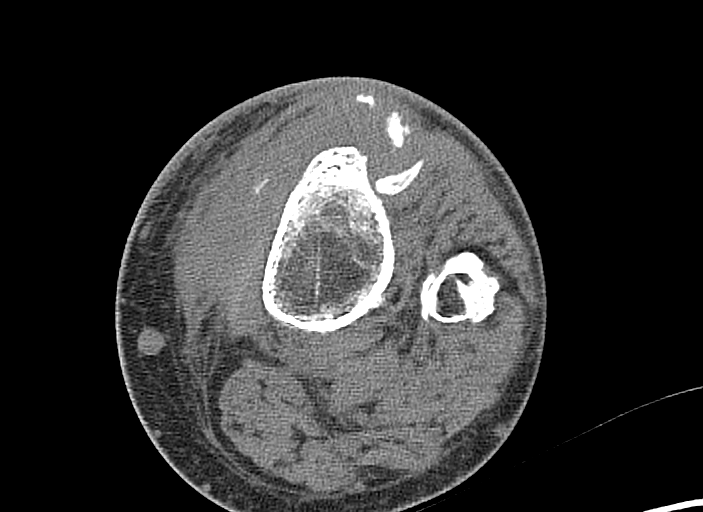
[im 506/607  bone]
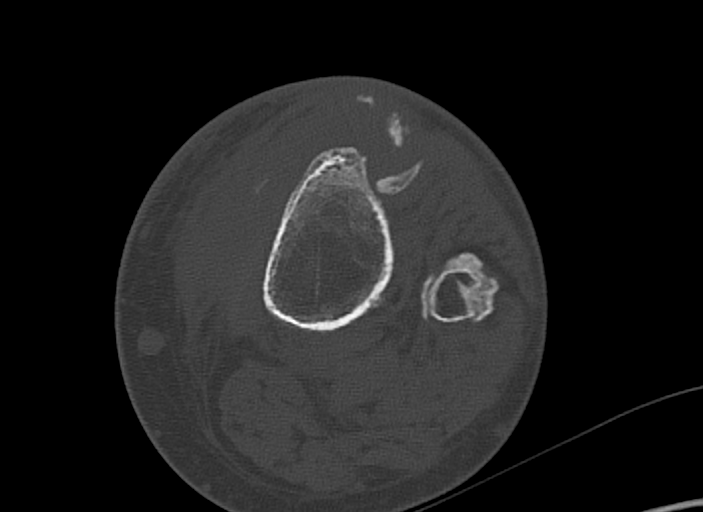

[Series 8: coronal bone lfov lower extremity 1.50 cor · coronal · 0.31mm/px · 3 of 213 slices shown]
[im 43/213  bone]
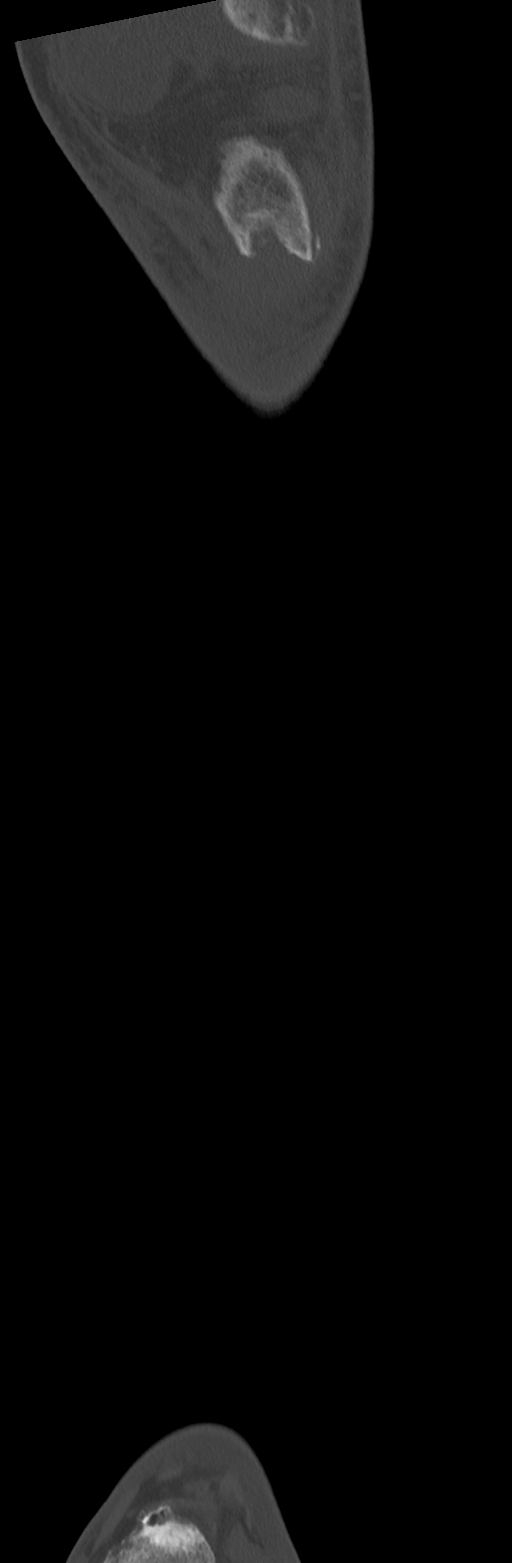
[im 85/213  bone]
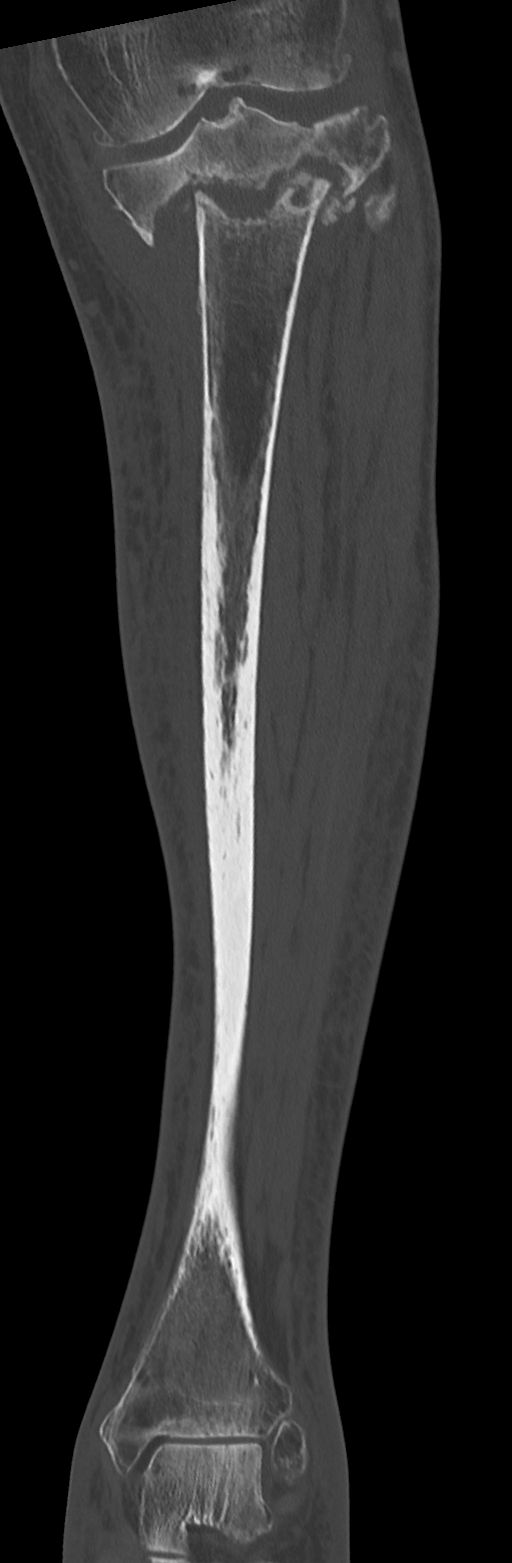
[im 128/213  bone]
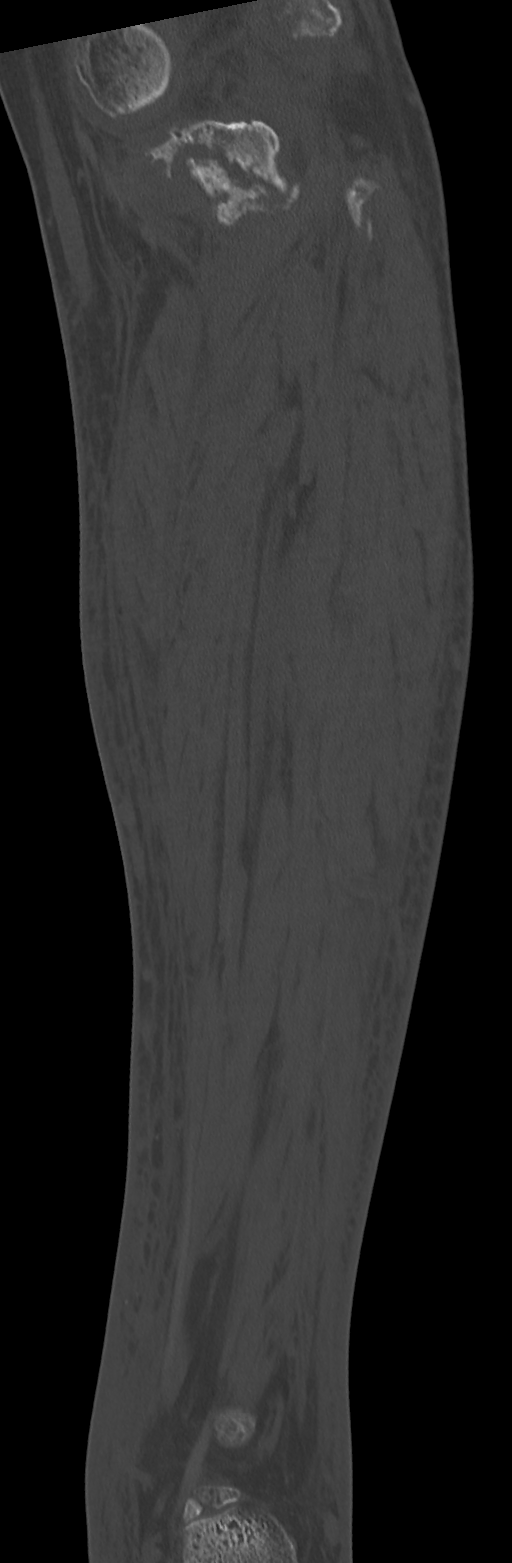

[8 of 35 positions shown; findings below may reference images not displayed]

FINDINGS: Bones/Joint/Cartilage

There is a comminuted impacted nonunion fracture of the proximal
tibia. The fracture involves the posterior aspects of the medial and
lateral tibial plateaus. There is a varus angulation at the tibial
fracture. Has been partial healing of the impacted fracture of the
proximal fibula with bridging callus formation and no angulation or
displacement.

The distal tibia and fibula are intact.

There is a moderate knee joint effusion.

Ligaments

Suboptimally assessed by CT. Cruciate ligaments are intact. The
visualized portions of the medial and lateral collateral ligaments
are intact.

Muscles and Tendons

Negative.

Soft tissues

Negative.
IMPRESSION: 1. Comminuted impacted nonunion fracture of the proximal tibia as
described.
2. Partial healing of the impacted fracture of the proximal fibula.
3. Moderate knee joint effusion.

## 2021-07-03 ENCOUNTER — Ambulatory Visit: Payer: PPO | Attending: Internal Medicine

## 2021-07-03 DIAGNOSIS — Z23 Encounter for immunization: Secondary | ICD-10-CM

## 2021-07-03 NOTE — Progress Notes (Signed)
° °  Covid-19 Vaccination Clinic  Name:  WADSWORTH SKOLNICK    MRN: [id number] DOB: Jan 05, 1946  07/03/2021  Mr. Devine was observed post Covid-19 immunization for 15 minutes without incident. He was provided with Vaccine Information Sheet and instruction to access the V-Safe system.   Mr. Hardenbrook was instructed to call 911 with any severe reactions post vaccine: Difficulty breathing  Swelling of face and throat  A fast heartbeat  A bad rash all over body  Dizziness and weakness   Immunizations Administered     Name Date Dose VIS Date Route   Pfizer Covid-19 Vaccine Bivalent Booster 07/03/2021  9:39 AM 0.3 mL 02/26/2021 Intramuscular   Manufacturer: [place]   Lot: [id number]   [place]: [facility]  Name:  ENNIS HEAVNER    MRN: [id number] DOB: 07-08-1945  07/03/2021  Mr. Golob was observed post Covid-19 immunization for 15 minutes without incident. He was provided with Vaccine Information Sheet and instruction to access the V-Safe system.   Mr. Pinkerton was instructed to call 911 with any severe reactions post vaccine: Difficulty breathing  Swelling of face and throat  A fast heartbeat  A bad rash all over body  Dizziness and weakness   Immunizations Administered     Name Date Dose VIS Date Route   Pfizer Covid-19 Vaccine Bivalent Booster 07/03/2021  9:39 AM 0.3 mL 02/26/2021 Intramuscular   Manufacturer: [place]   Lot: [id number]   [place]: [phone]

## 2021-07-04 ENCOUNTER — Other Ambulatory Visit: Payer: Self-pay

## 2021-07-04 MED ORDER — PFIZER COVID-19 VAC BIVALENT 30 MCG/0.3ML IM SUSP
INTRAMUSCULAR | 0 refills | Status: AC
  Filled 2021-07-04: qty 0.3, 1d supply, fill #0

## 2021-07-16 DIAGNOSIS — C44629 Squamous cell carcinoma of skin of left upper limb, including shoulder: Secondary | ICD-10-CM | POA: Diagnosis not present

## 2021-07-16 DIAGNOSIS — L905 Scar conditions and fibrosis of skin: Secondary | ICD-10-CM | POA: Diagnosis not present

## 2021-07-16 DIAGNOSIS — L820 Inflamed seborrheic keratosis: Secondary | ICD-10-CM | POA: Diagnosis not present

## 2021-07-16 DIAGNOSIS — L298 Other pruritus: Secondary | ICD-10-CM | POA: Diagnosis not present

## 2021-09-04 DIAGNOSIS — R972 Elevated prostate specific antigen [PSA]: Secondary | ICD-10-CM | POA: Diagnosis not present

## 2021-09-04 DIAGNOSIS — D649 Anemia, unspecified: Secondary | ICD-10-CM | POA: Diagnosis not present

## 2021-09-04 DIAGNOSIS — C61 Malignant neoplasm of prostate: Secondary | ICD-10-CM | POA: Diagnosis not present

## 2021-09-04 DIAGNOSIS — Z79899 Other long term (current) drug therapy: Secondary | ICD-10-CM | POA: Diagnosis not present

## 2021-11-27 ENCOUNTER — Other Ambulatory Visit: Payer: Self-pay

## 2021-11-27 DIAGNOSIS — Z862 Personal history of diseases of the blood and blood-forming organs and certain disorders involving the immune mechanism: Secondary | ICD-10-CM

## 2021-11-28 DIAGNOSIS — L570 Actinic keratosis: Secondary | ICD-10-CM | POA: Diagnosis not present

## 2021-11-28 DIAGNOSIS — D225 Melanocytic nevi of trunk: Secondary | ICD-10-CM | POA: Diagnosis not present

## 2021-11-28 DIAGNOSIS — X32XXXA Exposure to sunlight, initial encounter: Secondary | ICD-10-CM | POA: Diagnosis not present

## 2021-11-28 DIAGNOSIS — Z85820 Personal history of malignant melanoma of skin: Secondary | ICD-10-CM | POA: Diagnosis not present

## 2021-11-28 DIAGNOSIS — L111 Transient acantholytic dermatosis [Grover]: Secondary | ICD-10-CM | POA: Diagnosis not present

## 2021-11-28 DIAGNOSIS — D2262 Melanocytic nevi of left upper limb, including shoulder: Secondary | ICD-10-CM | POA: Diagnosis not present

## 2021-11-28 DIAGNOSIS — Z85828 Personal history of other malignant neoplasm of skin: Secondary | ICD-10-CM | POA: Diagnosis not present

## 2021-11-28 DIAGNOSIS — C44529 Squamous cell carcinoma of skin of other part of trunk: Secondary | ICD-10-CM | POA: Diagnosis not present

## 2021-11-28 DIAGNOSIS — D485 Neoplasm of uncertain behavior of skin: Secondary | ICD-10-CM | POA: Diagnosis not present

## 2021-12-05 ENCOUNTER — Other Ambulatory Visit: Payer: Self-pay

## 2021-12-05 ENCOUNTER — Other Ambulatory Visit: Payer: PPO

## 2021-12-05 DIAGNOSIS — C61 Malignant neoplasm of prostate: Secondary | ICD-10-CM

## 2021-12-05 DIAGNOSIS — Z862 Personal history of diseases of the blood and blood-forming organs and certain disorders involving the immune mechanism: Secondary | ICD-10-CM

## 2021-12-06 LAB — CBC WITH DIFFERENTIAL/PLATELET
Basophils Absolute: 0 10*3/uL (ref 0.0–0.2)
Basos: 1 %
EOS (ABSOLUTE): 0.2 10*3/uL (ref 0.0–0.4)
Eos: 5 %
Hematocrit: 36.5 % — ABNORMAL LOW (ref 37.5–51.0)
Hemoglobin: 12.6 g/dL — ABNORMAL LOW (ref 13.0–17.7)
Immature Grans (Abs): 0 10*3/uL (ref 0.0–0.1)
Immature Granulocytes: 0 %
Lymphocytes Absolute: 1.2 10*3/uL (ref 0.7–3.1)
Lymphs: 25 %
MCH: 31.8 pg (ref 26.6–33.0)
MCHC: 34.5 g/dL (ref 31.5–35.7)
MCV: 92 fL (ref 79–97)
Monocytes Absolute: 0.5 10*3/uL (ref 0.1–0.9)
Monocytes: 10 %
Neutrophils Absolute: 2.8 10*3/uL (ref 1.4–7.0)
Neutrophils: 59 %
Platelets: 227 10*3/uL (ref 150–450)
RBC: 3.96 x10E6/uL — ABNORMAL LOW (ref 4.14–5.80)
RDW: 12.5 % (ref 11.6–15.4)
WBC: 4.7 10*3/uL (ref 3.4–10.8)

## 2021-12-06 LAB — PSA: Prostate Specific Ag, Serum: <0.1 ng/mL (ref 0.0–4.0)

## 2021-12-11 ENCOUNTER — Ambulatory Visit: Payer: PPO | Admitting: Urology

## 2021-12-11 ENCOUNTER — Encounter: Payer: Self-pay | Admitting: Urology

## 2021-12-11 VITALS — BP 123/67 | HR 62 | Ht 75.0 in | Wt 220.0 lb

## 2021-12-11 DIAGNOSIS — N529 Male erectile dysfunction, unspecified: Secondary | ICD-10-CM

## 2021-12-11 DIAGNOSIS — Z8546 Personal history of malignant neoplasm of prostate: Secondary | ICD-10-CM

## 2021-12-11 DIAGNOSIS — R399 Unspecified symptoms and signs involving the genitourinary system: Secondary | ICD-10-CM

## 2021-12-11 DIAGNOSIS — C61 Malignant neoplasm of prostate: Secondary | ICD-10-CM

## 2021-12-11 MED ORDER — TADALAFIL 5 MG PO TABS: 5.0000 mg | ORAL_TABLET | Freq: Every day | ORAL | 11 refills | Status: AC

## 2021-12-11 NOTE — Progress Notes (Signed)
   12/11/2021 1:04 PM   Glenn Watson 04-May-1946 [id number]  Reason for visit: Follow up prostate cancer, ED, urinary symptoms  HPI: 76 year old very healthy male who had an elevated PSA of 9.9 and prostate biopsy in April 2022 showed a 40 g prostate with 4/12 cores positive for unfavorable intermediate risk prostate cancer with max core involvement of 92% with significant cribriform features.  He underwent 42-monthADT and gold seed marker placement on 11/27/2020, followed by IMRT with Dr. CBaruch Goutywith radiation oncology.  Initial PSA was undetectable.  PSA remains undetectable, most recently on 12/05/2021.  He feels like he is still having some hot flashes from the ADT, but he has resumed his normal activities including working out and overall is doing well.  He has done very well since treatment, and denies any significant urinary symptoms at this time or gross hematuria.  He continues on the Flomax 3 times per week, he is unsure if this makes a significant difference in his urination.  He has had ED and not been sexually active since the ADT injection.  He is interested in trying PDE 5 inhibitors at this point.  I recommended a trial of Cialis 5 mg daily, and we discussed this may actually improve the urination as well and he can stop the Flomax to see if anything worsens in terms of the urinary symptoms.  Risks and benefits of Cialis discussed, and we reviewed this dose could be titrated up as needed.  Cialis 5 mg daily for ED and mild urinary symptoms, can discontinue the Flomax RTC 6 months PSA prior   BBilley Co MD  [address] [place] [phone]

## 2021-12-15 ENCOUNTER — Telehealth: Payer: Self-pay

## 2021-12-15 NOTE — Telephone Encounter (Signed)
Incoming approval of PA for tadalafil '5mg'$ .   Dates: 12/12/21 - 12/13/22.

## 2021-12-18 ENCOUNTER — Ambulatory Visit: Payer: PPO | Admitting: Radiation Oncology

## 2021-12-26 DIAGNOSIS — L905 Scar conditions and fibrosis of skin: Secondary | ICD-10-CM | POA: Diagnosis not present

## 2021-12-26 DIAGNOSIS — C44529 Squamous cell carcinoma of skin of other part of trunk: Secondary | ICD-10-CM | POA: Diagnosis not present

## 2021-12-31 ENCOUNTER — Ambulatory Visit: Payer: PPO | Admitting: Radiation Oncology

## 2022-01-26 ENCOUNTER — Ambulatory Visit
Admission: RE | Admit: 2022-01-26 | Discharge: 2022-01-26 | Disposition: A | Discharge status: Home / Self Care | Payer: PPO | Source: Ambulatory Visit | Attending: Radiation Oncology | Admitting: Radiation Oncology

## 2022-01-26 ENCOUNTER — Other Ambulatory Visit: Payer: Self-pay | Admitting: *Deleted

## 2022-01-26 ENCOUNTER — Encounter: Payer: Self-pay | Admitting: Radiation Oncology

## 2022-01-26 VITALS — BP 118/71 | HR 51 | Temp 97.8°F | Resp 20 | Wt 218.0 lb

## 2022-01-26 DIAGNOSIS — N529 Male erectile dysfunction, unspecified: Secondary | ICD-10-CM | POA: Diagnosis not present

## 2022-01-26 DIAGNOSIS — Z08 Encounter for follow-up examination after completed treatment for malignant neoplasm: Secondary | ICD-10-CM | POA: Diagnosis not present

## 2022-01-26 DIAGNOSIS — C61 Malignant neoplasm of prostate: Secondary | ICD-10-CM | POA: Insufficient documentation

## 2022-01-26 DIAGNOSIS — Z923 Personal history of irradiation: Secondary | ICD-10-CM | POA: Diagnosis not present

## 2022-01-26 NOTE — Progress Notes (Signed)
Radiation Oncology Follow up Note  Name: Glenn Watson   Date:   01/26/2022 MRN:  [id number] DOB: 03/20/1946    This 76 y.o. male presents to the clinic today for 53-monthfollow-up status post image guided IMRT radiation therapy to his prostate for Gleason 7 (4+3) adenocarcinoma presenting with a PSA in the 10 range.  REFERRING PROVIDER: SIdelle Crouch MD  HPI: Patient is a 76year old male now out 10 months having completed image guided IMRT radiation therapy for Gleason 7 adenocarcinoma the prostate.  Seen today in routine follow-up he is doing well.  He specifically denies any increased lower urinary tract symptoms diarrhea or fatigue.  His most recent PSA shows excellent biochemical results at less than 0.1..Marland Kitchen He does have some erectile dysfunction which is being addressed by urology.  COMPLICATIONS OF TREATMENT: none  FOLLOW UP COMPLIANCE: keeps appointments   PHYSICAL EXAM:  BP 118/71 (BP Location: Left Arm, Patient Position: Sitting, Cuff Size: Normal)   Pulse (!) 51   Temp 97.8 F (36.6 C) (Tympanic)   Resp 20   Wt 218 lb (98.9 kg)   BMI 27.25 kg/m  Well-developed well-nourished patient in NAD. HEENT reveals PERLA, EOMI, discs not visualized.  Oral cavity is clear. No oral mucosal lesions are identified. Neck is clear without evidence of cervical or supraclavicular adenopathy. Lungs are clear to A&P. Cardiac examination is essentially unremarkable with regular rate and rhythm without murmur rub or thrill. Abdomen is benign with no organomegaly or masses noted. Motor sensory and DTR levels are equal and symmetric in the upper and lower extremities. Cranial nerves II through XII are grossly intact. Proprioception is intact. No peripheral adenopathy or edema is identified. No motor or sensory levels are noted. Crude visual fields are within normal range.  RADIOLOGY RESULTS: No current films for review  PLAN:Present time patient is under excellent biochemical control of his  prostate cancer.  And pleased with his overall progress.  Of asked to see him back in 6 months for follow-up.  If his PSA remains at this range we will go to once year follow-up visits.  He continues close follow-up care with urology.  I would like to take this opportunity to thank you for allowing me to participate in the care of your patient..Noreene Filbert MD

## 2022-02-26 DIAGNOSIS — Z79899 Other long term (current) drug therapy: Secondary | ICD-10-CM | POA: Diagnosis not present

## 2022-02-26 DIAGNOSIS — Z125 Encounter for screening for malignant neoplasm of prostate: Secondary | ICD-10-CM | POA: Diagnosis not present

## 2022-02-26 DIAGNOSIS — E7800 Pure hypercholesterolemia, unspecified: Secondary | ICD-10-CM | POA: Diagnosis not present

## 2022-02-26 DIAGNOSIS — R7309 Other abnormal glucose: Secondary | ICD-10-CM | POA: Diagnosis not present

## 2022-03-05 DIAGNOSIS — Z0000 Encounter for general adult medical examination without abnormal findings: Secondary | ICD-10-CM | POA: Diagnosis not present

## 2022-03-05 DIAGNOSIS — D649 Anemia, unspecified: Secondary | ICD-10-CM | POA: Diagnosis not present

## 2022-03-05 DIAGNOSIS — Z79899 Other long term (current) drug therapy: Secondary | ICD-10-CM | POA: Diagnosis not present

## 2022-03-05 DIAGNOSIS — Z125 Encounter for screening for malignant neoplasm of prostate: Secondary | ICD-10-CM | POA: Diagnosis not present

## 2022-03-05 DIAGNOSIS — I10 Essential (primary) hypertension: Secondary | ICD-10-CM | POA: Diagnosis not present

## 2022-03-05 DIAGNOSIS — R7309 Other abnormal glucose: Secondary | ICD-10-CM | POA: Diagnosis not present

## 2022-03-05 DIAGNOSIS — E781 Pure hyperglyceridemia: Secondary | ICD-10-CM | POA: Diagnosis not present

## 2022-03-05 DIAGNOSIS — E7800 Pure hypercholesterolemia, unspecified: Secondary | ICD-10-CM | POA: Diagnosis not present

## 2022-03-31 ENCOUNTER — Encounter: Payer: Self-pay | Admitting: Urology

## 2022-03-31 DIAGNOSIS — H40003 Preglaucoma, unspecified, bilateral: Secondary | ICD-10-CM | POA: Diagnosis not present

## 2022-04-15 ENCOUNTER — Other Ambulatory Visit: Payer: Self-pay

## 2022-04-16 ENCOUNTER — Other Ambulatory Visit: Payer: Self-pay

## 2022-04-16 MED ORDER — AREXVY 120 MCG/0.5ML IM SUSR
INTRAMUSCULAR | 0 refills | Status: AC
  Filled 2022-04-17: qty 0.5, 1d supply, fill #0

## 2022-04-16 MED ORDER — COVID-19 MRNA 2023-2024 VACCINE (COMIRNATY) 0.3 ML INJECTION
INTRAMUSCULAR | 0 refills | Status: AC
  Filled 2022-04-17 – 2022-04-21 (×2): qty 0.3, 1d supply, fill #0

## 2022-04-17 ENCOUNTER — Other Ambulatory Visit: Payer: Self-pay

## 2022-04-21 ENCOUNTER — Other Ambulatory Visit: Payer: Self-pay

## 2022-05-08 ENCOUNTER — Other Ambulatory Visit (HOSPITAL_BASED_OUTPATIENT_CLINIC_OR_DEPARTMENT_OTHER): Payer: Self-pay

## 2022-05-08 DIAGNOSIS — D2262 Melanocytic nevi of left upper limb, including shoulder: Secondary | ICD-10-CM | POA: Diagnosis not present

## 2022-05-08 DIAGNOSIS — Z85820 Personal history of malignant melanoma of skin: Secondary | ICD-10-CM | POA: Diagnosis not present

## 2022-05-08 DIAGNOSIS — D225 Melanocytic nevi of trunk: Secondary | ICD-10-CM | POA: Diagnosis not present

## 2022-05-08 DIAGNOSIS — D2271 Melanocytic nevi of right lower limb, including hip: Secondary | ICD-10-CM | POA: Diagnosis not present

## 2022-05-08 DIAGNOSIS — D2261 Melanocytic nevi of right upper limb, including shoulder: Secondary | ICD-10-CM | POA: Diagnosis not present

## 2022-06-16 ENCOUNTER — Ambulatory Visit: Payer: PPO | Admitting: Urology

## 2022-06-17 ENCOUNTER — Ambulatory Visit: Payer: PPO | Admitting: Urology

## 2022-06-17 ENCOUNTER — Encounter: Payer: Self-pay | Admitting: Urology

## 2022-06-17 VITALS — BP 128/62 | HR 60 | Ht 75.0 in | Wt 215.6 lb

## 2022-06-17 DIAGNOSIS — C61 Malignant neoplasm of prostate: Secondary | ICD-10-CM | POA: Diagnosis not present

## 2022-06-17 DIAGNOSIS — R399 Unspecified symptoms and signs involving the genitourinary system: Secondary | ICD-10-CM

## 2022-06-17 DIAGNOSIS — N529 Male erectile dysfunction, unspecified: Secondary | ICD-10-CM

## 2022-06-17 LAB — BLADDER SCAN AMB NON-IMAGING

## 2022-06-17 NOTE — Progress Notes (Signed)
   06/17/2022 9:47 AM   [name] 1945-07-25 [id number]  Reason for visit: Follow up prostate cancer, ED, urinary symptoms  HPI: 76 year old very healthy male who had an elevated PSA of 9.9 and prostate biopsy in April 2022 showed a 40 g prostate with 4/12 cores positive for unfavorable intermediate risk prostate cancer with max core involvement of 92% with significant cribriform features.  He underwent 64-monthADT and gold seed marker placement on 11/27/2020, followed by IMRT with Dr. CBaruch Goutywith radiation oncology.  Initial PSA was undetectable.  PSA remains undetectable, most recently on 02/26/2022(0.04).  His hot flashes have improved over the last few months and overall he feels well.  He has done very well since treatment, and denies any significant urinary symptoms at this time or gross hematuria.  PVR normal today at 325m  He continues on the Flomax 3 times per week, he is unsure if this makes a significant difference in his urination.  He has had ED and not been sexually active since the ADT injection.  At our last visit we trialed Cialis 5 mg daily, and he denies any significant change in the erections at this point.  We discussed alternatives including a trial of Viagra, but he would like to continue with the Cialis and knows to increase the dose as needed up to 20 mg max per day.  Okay to discontinue Flomax from urology perspective, could resume if recurrent urinary symptoms Okay to use Cialis either daily or as needed for ED, with dose 5 to 20 mg Keep scheduled follow-up with Dr. ChBaruch Goutyor next PSA check in the next few months RTC with urology 1 year PSA prior   BrBilley CoMD  [address] [phone]

## 2022-06-24 ENCOUNTER — Ambulatory Visit: Payer: PPO | Admitting: Urology

## 2022-07-09 ENCOUNTER — Telehealth: Payer: Self-pay | Admitting: *Deleted

## 2022-07-09 NOTE — Telephone Encounter (Signed)
Patient called stating that he has a lab appointment 07/22/22 for a PSA to be drawn and he is asking if we could also draw a CBC as he wants to donate blood but has a h/o anemia and he wants to know if his level is good enough to go donate

## 2022-07-17 ENCOUNTER — Other Ambulatory Visit: Payer: Self-pay | Admitting: *Deleted

## 2022-07-17 DIAGNOSIS — C61 Malignant neoplasm of prostate: Secondary | ICD-10-CM

## 2022-07-22 ENCOUNTER — Inpatient Hospital Stay: Payer: PPO | Attending: Radiation Oncology

## 2022-07-22 DIAGNOSIS — C61 Malignant neoplasm of prostate: Secondary | ICD-10-CM

## 2022-07-22 LAB — PSA: Prostatic Specific Antigen: 0.07 ng/mL (ref 0.00–4.00)

## 2022-07-24 DIAGNOSIS — S42242A 4-part fracture of surgical neck of left humerus, initial encounter for closed fracture: Secondary | ICD-10-CM | POA: Diagnosis not present

## 2022-07-29 ENCOUNTER — Ambulatory Visit: Payer: PPO | Admitting: Radiation Oncology

## 2022-07-29 DIAGNOSIS — S42242A 4-part fracture of surgical neck of left humerus, initial encounter for closed fracture: Secondary | ICD-10-CM | POA: Diagnosis not present

## 2022-08-07 DIAGNOSIS — S42242A 4-part fracture of surgical neck of left humerus, initial encounter for closed fracture: Secondary | ICD-10-CM | POA: Diagnosis not present

## 2022-08-24 DIAGNOSIS — S42242A 4-part fracture of surgical neck of left humerus, initial encounter for closed fracture: Secondary | ICD-10-CM | POA: Diagnosis not present

## 2022-08-27 ENCOUNTER — Encounter: Payer: Self-pay | Admitting: Radiation Oncology

## 2022-08-27 ENCOUNTER — Ambulatory Visit
Admission: RE | Admit: 2022-08-27 | Discharge: 2022-08-27 | Disposition: A | Discharge status: Home / Self Care | Payer: PPO | Source: Ambulatory Visit | Attending: Radiation Oncology | Admitting: Radiation Oncology

## 2022-08-27 VITALS — BP 126/75 | HR 53 | Temp 98.0°F | Resp 14 | Ht 75.0 in | Wt 217.6 lb

## 2022-08-27 DIAGNOSIS — Z191 Hormone sensitive malignancy status: Secondary | ICD-10-CM | POA: Diagnosis not present

## 2022-08-27 DIAGNOSIS — Z923 Personal history of irradiation: Secondary | ICD-10-CM | POA: Insufficient documentation

## 2022-08-27 DIAGNOSIS — C61 Malignant neoplasm of prostate: Secondary | ICD-10-CM | POA: Diagnosis not present

## 2022-08-27 NOTE — Progress Notes (Signed)
Radiation Oncology Follow up Note  Name: Glenn Watson   Date:   08/27/2022 MRN:  [id number] DOB: [birth year]    This 77 y.o. male presents to the clinic today for 20-monthfollow-up status post image guided IMRT radiation therapy to his prostate for Gleason 7 (4+3) presenting with a PSA in the 10 range.  REFERRING PROVIDER: SIdelle Crouch MD  HPI: Patient is a a 77year old male now out 60 months having completed image guided IMRT radiation therapy for prostate cancer.  Seen today in routine follow-up he is doing well.  Recently fractured his left humerus from a traumatic fall.  He specifically denies any increased lower urinary tract symptoms or any GI problems.  His most recent PSA is 0.07 it was 0.046 months prior.  COMPLICATIONS OF TREATMENT: none  FOLLOW UP COMPLIANCE: keeps appointments   PHYSICAL EXAM:  BP 126/75   Pulse (!) 53   Temp 98 F (36.7 C)   Resp 14   Ht '6\' 3"'$  (1.905 m)   Wt 217 lb 9.6 oz (98.7 kg)   BMI 27.20 kg/m  Well-developed well-nourished patient in NAD. HEENT reveals PERLA, EOMI, discs not visualized.  Oral cavity is clear. No oral mucosal lesions are identified. Neck is clear without evidence of cervical or supraclavicular adenopathy. Lungs are clear to A&P. Cardiac examination is essentially unremarkable with regular rate and rhythm without murmur rub or thrill. Abdomen is benign with no organomegaly or masses noted. Motor sensory and DTR levels are equal and symmetric in the upper and lower extremities. Cranial nerves II through XII are grossly intact. Proprioception is intact. No peripheral adenopathy or edema is identified. No motor or sensory levels are noted. Crude visual fields are within normal range.  RADIOLOGY RESULTS: No current films for review  PLAN: Present time patient is doing well from a prostate cancer standpoint.  Very low side effect profile.  Slight incremental increase in his PSA we will repeat that in 6 months and have asked to see  him back in follow-up at that time.  Patient is to call with any concerns.  I would like to take this opportunity to thank you for allowing me to participate in the care of your patient..Noreene Filbert MD

## 2022-09-03 ENCOUNTER — Telehealth: Payer: Self-pay | Admitting: Physical Therapy

## 2022-09-03 DIAGNOSIS — I10 Essential (primary) hypertension: Secondary | ICD-10-CM | POA: Diagnosis not present

## 2022-09-03 DIAGNOSIS — Z125 Encounter for screening for malignant neoplasm of prostate: Secondary | ICD-10-CM | POA: Diagnosis not present

## 2022-09-03 DIAGNOSIS — E7800 Pure hypercholesterolemia, unspecified: Secondary | ICD-10-CM | POA: Diagnosis not present

## 2022-09-03 DIAGNOSIS — R7309 Other abnormal glucose: Secondary | ICD-10-CM | POA: Diagnosis not present

## 2022-09-03 DIAGNOSIS — Z79899 Other long term (current) drug therapy: Secondary | ICD-10-CM | POA: Diagnosis not present

## 2022-09-03 NOTE — Telephone Encounter (Signed)
Called [facility] at [phone] and spoke with Barbaraann Share. Requested last visit note with Emily Filbert, MD and most recent xray report prior to patient's initial PT eval scheduled for Monday, 09/07/2022. Barbaraann Share said she would get it faxed to [place].   Everlean Alstrom. Graylon Good, PT, DPT 09/03/22, 5:01 PM  [facility] [address] [place], [place] [address] P: [phone] I F: [phone]

## 2022-09-07 ENCOUNTER — Ambulatory Visit: Payer: PPO | Attending: Specialist | Admitting: Physical Therapy

## 2022-09-07 ENCOUNTER — Encounter: Payer: Self-pay | Admitting: Physical Therapy

## 2022-09-07 DIAGNOSIS — M25612 Stiffness of left shoulder, not elsewhere classified: Secondary | ICD-10-CM | POA: Diagnosis not present

## 2022-09-07 DIAGNOSIS — M25512 Pain in left shoulder: Secondary | ICD-10-CM

## 2022-09-07 DIAGNOSIS — M6281 Muscle weakness (generalized): Secondary | ICD-10-CM | POA: Insufficient documentation

## 2022-09-07 DIAGNOSIS — M79622 Pain in left upper arm: Secondary | ICD-10-CM

## 2022-09-07 NOTE — Therapy (Signed)
OUTPATIENT PHYSICAL THERAPY EVALUATION   Patient Name: Glenn Watson MRN: [id number] DOB:05-25-1946, 77 y.o., male Today's Date: 09/07/2022  END OF SESSION:  PT End of Session - 09/07/22 1138     Visit Number 1    Number of Visits 24    Date for PT Re-Evaluation 11/30/22    Authorization Type [employer] reporting period from 09/07/2022    Progress Note Due on Visit 10    PT Start Time 0905    PT Stop Time 0945    PT Time Calculation (min) 40 min    Activity Tolerance Patient tolerated treatment well;Patient limited by pain    Behavior During Therapy [facility] for tasks assessed/performed             Past Medical History:  Diagnosis Date   Anemia    Asthma    as a child, exercise asthma - none since high school"   Cancer ([place])    skin cancer - basal, squamous- Mylenoma - stage 2- chest   Complication of anesthesia    "very sensitive to medications" "Had colonscopy with just Draminine"   Family history of adverse reaction to anesthesia    daughter- N/V   Hyperlipidemia    Hypertension    PONV (postoperative nausea and vomiting)    Past Surgical History:  Procedure Laterality Date   APPENDECTOMY  2011   Dr. Bary Castilla   COLONOSCOPY WITH PROPOFOL N/A 02/10/2017   Procedure: COLONOSCOPY WITH PROPOFOL;  Surgeon: Christene Lye, MD;  Location: [facility] ENDOSCOPY;  Service: Endoscopy;  Laterality: N/A;   ORIF TIBIA PLATEAU Left 08/03/2019   Procedure: OPEN REDUCTION INTERNAL FIXATION (ORIF) TIBIAL PLATEAU;  Surgeon: Altamese Garnet, MD;  Location: [place];  Service: Orthopedics;  Laterality: Left;   Patient Active Problem List   Diagnosis Date Noted   Prostate cancer ([place]) 03/04/2021   Hypertension    Hyperlipidemia    Asthma    Tibial plateau fracture, left, closed, with nonunion, subsequent encounter 08/03/2019   Closed fracture of shaft of tibia 03/28/2019   Retrocalcaneal bursitis, right 04/05/2018   Chronic anemia 11/14/2013   OA (osteoarthritis) 11/14/2013     PCP: Idelle Crouch, MD  REFERRING PROVIDER: Earnestine Leys, MD (Orthopaedics)  REFERRING DIAG: 4-part fracture of surgical neck of left humerus  THERAPY DIAG:  Pain in left upper arm  Stiffness of left shoulder, not elsewhere classified  Left shoulder pain, unspecified chronicity  Muscle weakness (generalized)  Rationale for Evaluation and Treatment: Rehabilitation  ONSET DATE: Left humerus fracture 07/24/2022  SUBJECTIVE:  SUBJECTIVE STATEMENT: Patient reports he is here at physical after he fell and broke his left proximal humerus on the last Friday in January. He had just left the gym and le slipped on his left foot in the silt in a puddle he didn't see. He reached out with his left arm and sustained a [place]. He was able to get in to [facility] immediately. He has been following the advice of his orthopedist there and is now coming to PT at their behest. He states he is now off all medications for his left shoulder fracture except an anti-inflammatory. He states his left upper arm is sore when he moves it. His doctor does not want him to sleep on the left shoulder so he still sleeps in his sling. He states he has not been wearing during the day since last Thursday. There was a lot of talk about shoulder surgery and shoulder replacement, but patient wanted to see how things went without surgery. Patient and doctor feel he is doing very well.  He cannot flex or abduct his shoulder to 90 degrees. He has been doing pendulums exercises in multiple directions. He wash dishes but it is difficult. He must put his left UE in first. He had a tremendous amount of trouble with swelling in the left UE, but it is negligible now compared to that.  He has continued to go to the gym every other day but has had to modify  what he does due to his left humerus fracture. He has lost some weight since last time he was here. He states he is partially amidextrus. He usually uses his left hand for ball throwing. He states doctor said he would likely not get back full ROM in his left shoulder. Patient reports a little intermittent numbness or tingling in left hand/fingers that he is not too worried about.   PERTINENT HISTORY: Patient is a 77 y.o. male who presents to outpatient physical therapy with a referral for medical diagnosis 4-part fracture of surgical neck of left humerus. This patient's chief complaints consist of left upper arm and shoulder pain, shoulder stiffness, and L UE weakness/swelling, leading to the following functional deficits: difficulty completing any activities that require use of left UE, showering, washing dishes, dressing, hygiene, working out at [employer] for health, sleeping, housework, cooking, throwing balls, some things he does left handed. Relevant past medical history and comorbidities include prostate cancer (dx Fall 2021, 40 radiation treatments in summer 2022, now doing well followed 1x a year), HTN, hyperlipidemia, asthma (as a child), ORIF L tibial Plateau (08/03/2019), OA, appendectomy, skin cancer (removed).  Patient denies hx of stroke, seizures, lung problems, heart problems, diabetes, unexplained weight loss, unexplained changes in bowel or bladder problems, unexplained stumbling or dropping things, osteoporosis, and spinal surgery  PAIN:  Are you having pain? Yes: NPRS scale: Current: 0/10,  Best: 0/10, Worst: 3/10. Pain location: lateral left shoulder over deltoid Pain description: stiffness Aggravating factors: moving the shoulder Relieving factors: staying still   FUNCTIONAL LIMITATIONS: difficulty completing any activities that require use of left UE, showering, washing dishes, dressing, hygiene, working out at [employer] for health, sleeping, housework, cooking, throwing balls, some  things he does left handed.   PRECAUTIONS: patient does not remember his doctor telling him anything.   WEIGHT BEARING RESTRICTIONS: none official  FALLS:  Has patient fallen in last 6 months? Yes. Number of falls 1 that resulted in L proximal humeral fracture  LIVING ENVIRONMENT: Lives with: lives with  their spouse who is a retired Community education officer. He has no concerns about getting around home.   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   LEISURE: Go to the gym, go walking, going to the donut store to drink coffee, a home body, piddle around at home.   PLOF: Independent  PATIENT GOALS: He would like to be able to throw a ball with his left hand. He would like to be able to button a button on his shirt.   NEXT MD VISIT: ~ Monday at the end of March.   OBJECTIVE  DIAGNOSTIC FINDINGS:  Xray of left shoulder visit note from 08/24/2022: "fracture is consolidating with some early callus. Overall alignment is good."  SELF- REPORTED FUNCTION FOTO score: 44/100 (upper arm questionnaire)  OBSERVATION/INSPECTION Posture Posture (standing): mildly forward head, right shoulder slighly lower than left. WFL Anthropometrics Tremor: keeps moving left UE almost involuntarily at rest. Body composition: BMI 26.9 Edema: swelling noted throughout entire left UE compared to right.  Functional Mobility Bed mobility: supine <> sit I Transfers: sit <> stand mod I for increased time/effort and use of UE Gait: grossly WFL for household and short community ambulation. More detailed gait analysis deferred to later date as needed.    PERIPHERAL JOINT MOTION (in degrees)  ACTIVE RANGE OF MOTION (AROM) *Indicates pain 09/07/22 Date Date  Joint/Motion R/L R/L R/L  Shoulder     Flexion 142/50* / /  Extension 72/40* / /  Abduction  155/55* / /  External rotation 60/0 / /  Internal rotation T8/Sacrum* / /  Elbow     Flexion  134/130 / /  Extension  -6/-8  / /  Comments:  09/07/2022: pulling/discomfort pain at left painful movements. Abduction most painful. Compensatory L shoulder hike with ABD>FLEX.   PASSIVE RANGE OF MOTION (PROM) *Indicates pain 09/07/22 Date Date  Joint/Motion R/L R/L R/L  Shoulder     Flexion /108* / /  Extension / / /  Abduction  /68* / /  External rotation /28* / /  Internal rotation /60* / /  Comments:  09/07/2022: flexion and abduction limited by pain/empty end feel with mild guarding. L shoulder ER/IR at 45 degrees scaption.   MUSCLE PERFORMANCE (MMT):  Comments: MMT deferred due to time since fracture.   Grip strength (in pounds, average of three measures).  R: (59+59+65)/3 = 61 L: (34+29+30)/ 3 = 31    TODAY'S TREATMENT:  Therapeutic exercise: to centralize symptoms and improve ROM, strength, muscular endurance, and activity tolerance required for successful completion of functional activities.  - hooklying L shoulder AAROM ER with cane, 1x10 with 10 second holds.  - hooklying AAROM B shoulder flexion with cane, 1x1 (very difficult and incomplete elbow extension, discontinued).  - seated L shoulder scaption AAROM table slide, 1x5 with 10 second hold.  - Education on diagnosis, prognosis, POC, anatomy and physiology of current condition.  - Education on HEP including handout   Pt required multimodal cuing for proper technique and to facilitate improved neuromuscular control, strength, range of motion, and functional ability resulting in improved performance and form.  PATIENT EDUCATION: Education details: Exercise purpose/form. Self management techniques. Education on diagnosis, prognosis, POC, anatomy and physiology of current condition Education on HEP including handout  Person educated: Patient Education method: Explanation, Demonstration, Tactile cues, Verbal cues, and Handouts Education comprehension: verbalized understanding, returned demonstration, and needs further education  HOME EXERCISE  PROGRAM: Access Code: [email or web address] URL: [email or web address] Date: 09/07/2022 Prepared  by: Rosita Kea  Exercises - Supine Shoulder External Rotation in 45 Degrees Abduction AAROM with [place]  - 1-2 x daily - 1 sets - 10 reps - 10 seconds hold - Seated Shoulder Flexion Towel Slide at Table Top Full Range of Motion  - 1-2 x daily - 1 sets - 10 reps - 10 hold  ASSESSMENT:  CLINICAL IMPRESSION: Patient is a 77 y.o. male referred to outpatient physical therapy with a medical diagnosis of 4-part fracture of surgical neck of left humerus who presents with signs and symptoms consistent with R shoulder pain, stiffness, and weakness, and L UE swelling 2/2 proximal humerus fracture on 07/24/2022 after [place]. Patient presents with significant swelling, pain, joint stiffness, guarding, motor control, muscle tension, muscle performance (strength/power/endurance), swelling, tissue integrity, and activity tolerance impairments that are limiting ability to complete any activities that require use of left UE, showering, washing dishes, dressing, hygiene, working out at [employer] for health, sleeping, housework, cooking, throwing balls, some things he does left handed without difficulty. He currently has significant limitations due to pain with movement overhead with empty end feel with flexion and abduction. Patient will benefit from skilled physical therapy intervention to address current body structure impairments and activity limitations to improve function and work towards goals set in current POC in order to return to prior level of function or maximal functional improvement.   OBJECTIVE IMPAIRMENTS: decreased activity tolerance, decreased coordination, decreased endurance, decreased knowledge of condition, decreased mobility, decreased ROM, decreased strength, hypomobility, increased edema, increased fascial restrictions, impaired perceived functional ability, increased muscle spasms, impaired  flexibility, impaired UE functional use, improper body mechanics, and pain.   ACTIVITY LIMITATIONS: carrying, lifting, sleeping, bathing, dressing, reach over head, hygiene/grooming, and caring for others  PARTICIPATION LIMITATIONS: meal prep, cleaning, interpersonal relationship, shopping, community activity, occupation, yard work, and   difficulty completing any activities that require use of left UE, showering, washing dishes, dressing, hygiene, working out at [employer] for health, sleeping, housework, cooking, throwing balls, some things he does left handed  PERSONAL FACTORS: Age, Past/current experiences, Time since onset of injury/illness/exacerbation, and 3+ comorbidities:   prostate cancer (dx Fall 2021, 40 radiation treatments in summer 2022, now doing well followed 1x a year), HTN, hyperlipidemia, asthma (as a child), ORIF L tibial Plateau (08/03/2019), OA, appendectomy, skin cancer (removed) are also affecting patient's functional outcome.   REHAB POTENTIAL: Good  CLINICAL DECISION MAKING: Stable/uncomplicated  EVALUATION COMPLEXITY: Low   GOALS: Goals reviewed with patient? No  SHORT TERM GOALS: Target date: 09/21/2022  Patient will be independent with initial home exercise program for self-management of symptoms. Baseline: Initial HEP provided at IE (09/07/22); Goal status: INITIAL   LONG TERM GOALS: Target date: 11/30/2022  Patient will be independent with a long-term home exercise program for self-management of symptoms.  Baseline: Initial HEP provided at IE (09/07/22); Goal status: INITIAL  2.  Patient will demonstrate improved FOTO to equal or greater than 67 by visit #12 to demonstrate improvement in overall condition and self-reported functional ability.  Baseline: 44 (09/07/22); Goal status: INITIAL  3.  Patient will demonstrate L shoulder AROM equal or greater than 130 degrees flexion, 110 degrees abduction,  80 degrees ER at 90 degrees abduction, and IR to L1 to  improve his ability to dress, reach overhead, and throw balls.  Baseline: very limited  - see objective (09/07/22); Goal status: INITIAL  4.  Patient will demonstrate L shoulder strength equal or greater than 4/5 MMT in flexion, abduction, ER,  and IR to improve his ability to reach overhead, throw a ball, and dress. Baseline: testing deferred due to acuity of injury (09/07/22); Goal status: INITIAL  5.  Patient will complete community, work and/or recreational activities with equal or less than 75% limitation due to current condition.  Baseline: difficulty completing any activities that require use of left UE, showering, washing dishes, dressing, hygiene, working out at [employer] for health, sleeping, housework, cooking, throwing balls, some things he does left handed (09/07/22); Goal status: INITIAL  PLAN:  PT FREQUENCY: 1-2x/week  PT DURATION: 12 weeks  PLANNED INTERVENTIONS: Therapeutic exercises, Therapeutic activity, Neuromuscular re-education, Balance training, Gait training, Patient/Family education, Self Care, Joint mobilization, Dry Needling, Electrical stimulation, Spinal mobilization, Cryotherapy, Moist heat, Manual therapy, and Re-evaluation  PLAN FOR NEXT SESSION: Update HEP as appropriate, progressive PROM, AAROM, strengthening for posture, shoulder girdle, and UE. Manual therapy for pain control, improved ROM.    Everlean Alstrom. Graylon Good, PT, DPT 09/07/22, 11:59 AM  [facility] [address] [place], [place] [address] P: [phone] I F: [phone]

## 2022-09-10 ENCOUNTER — Ambulatory Visit: Payer: PPO | Admitting: Physical Therapy

## 2022-09-10 ENCOUNTER — Encounter: Payer: Self-pay | Admitting: Physical Therapy

## 2022-09-10 DIAGNOSIS — M79622 Pain in left upper arm: Secondary | ICD-10-CM

## 2022-09-10 DIAGNOSIS — M25512 Pain in left shoulder: Secondary | ICD-10-CM

## 2022-09-10 DIAGNOSIS — M6281 Muscle weakness (generalized): Secondary | ICD-10-CM

## 2022-09-10 DIAGNOSIS — M25612 Stiffness of left shoulder, not elsewhere classified: Secondary | ICD-10-CM

## 2022-09-10 NOTE — Therapy (Signed)
OUTPATIENT PHYSICAL THERAPY TREATMENT NOTE   Patient Name: Glenn Watson MRN: [id number] DOB:[birth year], 77 y.o., male Today's Date: 09/10/2022  PCP: Idelle Crouch, MD REFERRING PROVIDER: Earnestine Leys, MD  END OF SESSION:   PT End of Session - 09/10/22 0904     Visit Number 2    Number of Visits 24    Date for PT Re-Evaluation 11/30/22    Authorization Type [employer] reporting period from 09/07/2022    Progress Note Due on Visit 10    PT Start Time 0900    PT Stop Time 0940    PT Time Calculation (min) 40 min    Activity Tolerance Patient tolerated treatment well;Patient limited by pain    Behavior During Therapy [facility] for tasks assessed/performed             Past Medical History:  Diagnosis Date   Anemia    Asthma    as a child, exercise asthma - none since high school"   Cancer ([place])    skin cancer - basal, squamous- Mylenoma - stage 2- chest   Complication of anesthesia    "very sensitive to medications" "Had colonscopy with just Draminine"   Family history of adverse reaction to anesthesia    daughter- N/V   Hyperlipidemia    Hypertension    PONV (postoperative nausea and vomiting)    Past Surgical History:  Procedure Laterality Date   APPENDECTOMY  2011   Dr. Bary Castilla   COLONOSCOPY WITH PROPOFOL N/A 02/10/2017   Procedure: COLONOSCOPY WITH PROPOFOL;  Surgeon: Christene Lye, MD;  Location: [facility];  Service: Endoscopy;  Laterality: N/A;   ORIF TIBIA PLATEAU Left 08/03/2019   Procedure: OPEN REDUCTION INTERNAL FIXATION (ORIF) TIBIAL PLATEAU;  Surgeon: Altamese [place], MD;  Location: [place];  Service: Orthopedics;  Laterality: Left;   Patient Active Problem List   Diagnosis Date Noted   Prostate cancer ([place]) 03/04/2021   Hypertension    Hyperlipidemia    Asthma    Tibial plateau fracture, left, closed, with nonunion, subsequent encounter 08/03/2019   Closed fracture of shaft of tibia 03/28/2019   Retrocalcaneal bursitis, right  04/05/2018   Chronic anemia 11/14/2013   OA (osteoarthritis) 11/14/2013    REFERRING DIAG: 4-part fracture of surgical neck of left humerus  THERAPY DIAG:  Pain in left upper arm  Stiffness of left shoulder, not elsewhere classified  Left shoulder pain, unspecified chronicity  Muscle weakness (generalized)  Rationale for Evaluation and Treatment: Rehabilitation  PERTINENT HISTORY: Patient is a 77 y.o. male who presents to outpatient physical therapy with a referral for medical diagnosis 4-part fracture of surgical neck of left humerus. This patient's chief complaints consist of left upper arm and shoulder pain, shoulder stiffness, and L UE weakness/swelling, leading to the following functional deficits: difficulty completing any activities that require use of left UE, showering, washing dishes, dressing, hygiene, working out at [employer] for health, sleeping, housework, cooking, throwing balls, some things he does left handed. Relevant past medical history and comorbidities include prostate cancer (dx Fall 2021, 40 radiation treatments in summer 2022, now doing well followed 1x a year), HTN, hyperlipidemia, asthma (as a child), ORIF L tibial Plateau (08/03/2019), OA, appendectomy, skin cancer (removed).  Patient denies hx of stroke, seizures, lung problems, heart problems, diabetes, unexplained weight loss, unexplained changes in bowel or bladder problems, unexplained stumbling or dropping things, osteoporosis, and spinal surgery   PRECAUTIONS: fracture 07/24/2022  SUBJECTIVE:  SUBJECTIVE STATEMENT:  Patient reports he is feeling well today. He has been doing his table slides 2x a day and his AAROM ER once a day. He noticed his ROM was much better during one of his table slides but was stiff the next day. He states he is  recovering well between HEP bouts.    PAIN:  Are you having pain? NPRS 0/10 left shoulder/upper arm   OBJECTIVE:   TODAY'S TREATMENT:  Therapeutic exercise: to centralize symptoms and improve ROM, strength, muscular endurance, and activity tolerance required for successful completion of functional activities.  - seated AAROM L shoulder flexion with pulley, 1x15 with 5-10 second holds.  - seated AAROM L shoulder abduction with pulley, 1x15 with 5 second holds.  - hooklying L shoulder AAROM ER with cane, 1x5 with 10 second holds.  - hooklying AAROM B shoulder flexion with cane, 1x1 (very difficult/painful, discontinued).  - hooklying AAROM chest press with PVC stick, 1x1 (very painful and incomplete elbow extension, discontinued).  - standing L shoulder isometric exercises, 1x10 with 5 second holds in the following directions: flexion, IR, ER.  - Education on HEP including handout    Manual therapy: to reduce pain and tissue tension, improve range of motion, neuromodulation, in order to promote improved ability to complete functional activities. HOOKLYING - L shoulder PROM flexion and ER 1-5 reps each direction (painful, guarded, discontinued).   Pt required multimodal cuing for proper technique and to facilitate improved neuromuscular control, strength, range of motion, and functional ability resulting in improved performance and form.   PATIENT EDUCATION: Education details: Exercise purpose/form. Self management techniques.  Person educated: Patient Education method: Explanation, Demonstration, Tactile cues, Verbal cues, and Handouts Education comprehension: verbalized understanding, returned demonstration, and needs further education   HOME EXERCISE PROGRAM: Access Code: [email or web address] URL: [email or web address] Date: 09/10/2022 Prepared by: Rosita Kea  Exercises - Supine Shoulder External Rotation in 45 Degrees Abduction AAROM with [place]  - 3 x daily - 1 sets - 10  reps - 10 seconds hold - Seated Shoulder Flexion AAROM with Pulley Behind  - 3 x daily - 1 sets - 10-20 reps - 5-10 seconds hold - Seated Shoulder Abduction AAROM with Pulley Behind  - 3 x daily - 1 sets - 10-20 reps - 5-10 seconds hold - Standing Isometric Shoulder Flexion with Doorway - Arm Bent  - 2 x daily - 1 sets - 10 reps - 5 seconds hold - Standing Isometric Shoulder Internal Rotation at Doorway  - 2 x daily - 1 sets - 10 reps - 5 seconds hold - Standing Isometric Shoulder External Rotation with Doorway  - 2 x daily - 1 sets - 10 reps - 5 seconds hold    ASSESSMENT:   CLINICAL IMPRESSION: Patient arrives reporting good tolerance to HEP and improving L shoulder AAROM. Continued with exercises for ROM and started gentle strengthening for improved AROM. Very painful to complete AROM so utilized isometrics which were more readily tolerated. Patient also is more comfortable with AAROM motion compared to PROM. Plan to update HEP as appropriate and continue with progressive interventions for improved motion and strength next session. Patient would benefit from continued management of limiting condition by skilled physical therapist to address remaining impairments and functional limitations to work towards stated goals and return to PLOF or maximal functional independence.   From initial PT eval 09/07/2022: Patient is a 77 y.o. male referred to outpatient physical therapy with a medical diagnosis of 4-part fracture of surgical neck of  left humerus who presents with signs and symptoms consistent with R shoulder pain, stiffness, and weakness, and L UE swelling 2/2 proximal humerus fracture on 07/24/2022 after [place]. Patient presents with significant swelling, pain, joint stiffness, guarding, motor control, muscle tension, muscle performance (strength/power/endurance), swelling, tissue integrity, and activity tolerance impairments that are limiting ability to complete any activities that require use of  left UE, showering, washing dishes, dressing, hygiene, working out at [employer] for health, sleeping, housework, cooking, throwing balls, some things he does left handed without difficulty. He currently has significant limitations due to pain with movement overhead with empty end feel with flexion and abduction. Patient will benefit from skilled physical therapy intervention to address current body structure impairments and activity limitations to improve function and work towards goals set in current POC in order to return to prior level of function or maximal functional improvement.    OBJECTIVE IMPAIRMENTS: decreased activity tolerance, decreased coordination, decreased endurance, decreased knowledge of condition, decreased mobility, decreased ROM, decreased strength, hypomobility, increased edema, increased fascial restrictions, impaired perceived functional ability, increased muscle spasms, impaired flexibility, impaired UE functional use, improper body mechanics, and pain.    ACTIVITY LIMITATIONS: carrying, lifting, sleeping, bathing, dressing, reach over head, hygiene/grooming, and caring for others   PARTICIPATION LIMITATIONS: meal prep, cleaning, interpersonal relationship, shopping, community activity, occupation, yard work, and   difficulty completing any activities that require use of left UE, showering, washing dishes, dressing, hygiene, working out at [employer] for health, sleeping, housework, cooking, throwing balls, some things he does left handed   PERSONAL FACTORS: Age, Past/current experiences, Time since onset of injury/illness/exacerbation, and 3+ comorbidities:   prostate cancer (dx Fall 2021, 40 radiation treatments in summer 2022, now doing well followed 1x a year), HTN, hyperlipidemia, asthma (as a child), ORIF L tibial Plateau (08/03/2019), OA, appendectomy, skin cancer (removed) are also affecting patient's functional outcome.    REHAB POTENTIAL: Good   CLINICAL DECISION MAKING:  Stable/uncomplicated   EVALUATION COMPLEXITY: Low     GOALS: Goals reviewed with patient? No   SHORT TERM GOALS: Target date: 09/21/2022   Patient will be independent with initial home exercise program for self-management of symptoms. Baseline: Initial HEP provided at IE (09/07/22); Goal status: In-progress     LONG TERM GOALS: Target date: 11/30/2022   Patient will be independent with a long-term home exercise program for self-management of symptoms.  Baseline: Initial HEP provided at [facility] (09/07/22); Goal status: In-progress   2.  Patient will demonstrate improved FOTO to equal or greater than 67 by visit #12 to demonstrate improvement in overall condition and self-reported functional ability.  Baseline: 44 (09/07/22); Goal status: In-progress   3.  Patient will demonstrate L shoulder AROM equal or greater than 130 degrees flexion, 110 degrees abduction,  80 degrees ER at 90 degrees abduction, and IR to L1 to improve his ability to dress, reach overhead, and throw balls.  Baseline: very limited  - see objective (09/07/22); Goal status: INITIAL   4.  Patient will demonstrate L shoulder strength equal or greater than 4/5 MMT in flexion, abduction, ER, and IR to improve his ability to reach overhead, throw a ball, and dress. Baseline: testing deferred due to acuity of injury (09/07/22); Goal status: In-progress   5.  Patient will complete community, work and/or recreational activities with equal or less than 75% limitation due to current condition.  Baseline: difficulty completing any activities that require use of left UE, showering, washing dishes, dressing, hygiene, working out at  the gym for health, sleeping, housework, cooking, throwing balls, some things he does left handed (09/07/22); Goal status: In-progress   PLAN:   PT FREQUENCY: 1-2x/week   PT DURATION: 12 weeks   PLANNED INTERVENTIONS: Therapeutic exercises, Therapeutic activity, Neuromuscular re-education, Balance  training, Gait training, Patient/Family education, Self Care, Joint mobilization, Dry Needling, Electrical stimulation, Spinal mobilization, Cryotherapy, Moist heat, Manual therapy, and Re-evaluation   PLAN FOR NEXT SESSION: Update HEP as appropriate, progressive PROM, AAROM, strengthening for posture, shoulder girdle, and UE. Manual therapy for pain control, improved ROM.    Nancy Nordmann, PT, DPT 09/10/2022, 3:52 PM  [facility] [address] [place], [place] [address] P: [phone] I F: [phone]

## 2022-09-14 ENCOUNTER — Encounter: Payer: Self-pay | Admitting: Physical Therapy

## 2022-09-14 ENCOUNTER — Ambulatory Visit: Payer: PPO | Admitting: Physical Therapy

## 2022-09-14 DIAGNOSIS — M6281 Muscle weakness (generalized): Secondary | ICD-10-CM

## 2022-09-14 DIAGNOSIS — M25512 Pain in left shoulder: Secondary | ICD-10-CM

## 2022-09-14 DIAGNOSIS — M25612 Stiffness of left shoulder, not elsewhere classified: Secondary | ICD-10-CM

## 2022-09-14 DIAGNOSIS — M79622 Pain in left upper arm: Secondary | ICD-10-CM

## 2022-09-14 NOTE — Therapy (Signed)
OUTPATIENT PHYSICAL THERAPY TREATMENT NOTE   Patient Name: Glenn Watson MRN: [id number] DOB:Sep 18, 1945, 77 y.o., male Today's Date: 09/14/2022  PCP: Idelle Crouch, MD REFERRING PROVIDER: Earnestine Leys, MD  END OF SESSION:   PT End of Session - 09/14/22 1034     Visit Number 3    Number of Visits 24    Date for PT Re-Evaluation 11/30/22    Authorization Type [employer] reporting period from 09/07/2022    Progress Note Due on Visit 10    PT Start Time 1032    PT Stop Time 1110    PT Time Calculation (min) 38 min    Activity Tolerance Patient tolerated treatment well;Patient limited by pain    Behavior During Therapy WFL for tasks assessed/performed              Past Medical History:  Diagnosis Date   Anemia    Asthma    as a child, exercise asthma - none since high school"   Cancer ([place])    skin cancer - basal, squamous- Mylenoma - stage 2- chest   Complication of anesthesia    "very sensitive to medications" "Had colonscopy with just Draminine"   Family history of adverse reaction to anesthesia    daughter- N/V   Hyperlipidemia    Hypertension    PONV (postoperative nausea and vomiting)    Past Surgical History:  Procedure Laterality Date   APPENDECTOMY  2011   Dr. Bary Castilla   COLONOSCOPY WITH PROPOFOL N/A 02/10/2017   Procedure: COLONOSCOPY WITH PROPOFOL;  Surgeon: Christene Lye, MD;  Location: [facility];  Service: Endoscopy;  Laterality: N/A;   ORIF TIBIA PLATEAU Left 08/03/2019   Procedure: OPEN REDUCTION INTERNAL FIXATION (ORIF) TIBIAL PLATEAU;  Surgeon: Altamese Clear Lake, MD;  Location: [facility];  Service: Orthopedics;  Laterality: Left;   Patient Active Problem List   Diagnosis Date Noted   Prostate cancer ([place]) 03/04/2021   Hypertension    Hyperlipidemia    Asthma    Tibial plateau fracture, left, closed, with nonunion, subsequent encounter 08/03/2019   Closed fracture of shaft of tibia 03/28/2019   Retrocalcaneal bursitis,  right 04/05/2018   Chronic anemia 11/14/2013   OA (osteoarthritis) 11/14/2013    REFERRING DIAG: 4-part fracture of surgical neck of left humerus  THERAPY DIAG:  Pain in left upper arm  Stiffness of left shoulder, not elsewhere classified  Left shoulder pain, unspecified chronicity  Muscle weakness (generalized)  Rationale for Evaluation and Treatment: Rehabilitation  PERTINENT HISTORY: Patient is a 77 y.o. male who presents to outpatient physical therapy with a referral for medical diagnosis 4-part fracture of surgical neck of left humerus. This patient's chief complaints consist of left upper arm and shoulder pain, shoulder stiffness, and L UE weakness/swelling, leading to the following functional deficits: difficulty completing any activities that require use of left UE, showering, washing dishes, dressing, hygiene, working out at [employer] for health, sleeping, housework, cooking, throwing balls, some things he does left handed. Relevant past medical history and comorbidities include prostate cancer (dx Fall 2021, 40 radiation treatments in summer 2022, now doing well followed 1x a year), HTN, hyperlipidemia, asthma (as a child), ORIF L tibial Plateau (08/03/2019), OA, appendectomy, skin cancer (removed).  Patient denies hx of stroke, seizures, lung problems, heart problems, diabetes, unexplained weight loss, unexplained changes in bowel or bladder problems, unexplained stumbling or dropping things, osteoporosis, and spinal surgery   PRECAUTIONS: fracture 07/24/2022  SUBJECTIVE:  SUBJECTIVE STATEMENT:  Patient reports he is feeling well today and using the pulleys has "been like a dream." The first 4 reps of pulley exercise are tough, then his arm comes up a lot easier than before and he gets pretty high. He states  his wife got out the goniometer at home and he was able to get 135 degrees of elevation. He has been doing the pulleys every other day 2x10 each direction once a day (the days he does it). He is not taking any pain medications.    PAIN:  Are you having pain? NPRS 0/10 left shoulder/upper arm   OBJECTIVE:   TODAY'S TREATMENT:  Therapeutic exercise: to centralize symptoms and improve ROM, strength, muscular endurance, and activity tolerance required for successful completion of functional activities.  - seated AAROM L shoulder flexion with pulley, 1x10 with 10 second holds.  - seated AAROM L shoulder abduction with pulley, 1x10 with 10 second holds.  - standing dumbbell passes behind back, 1x10 each direction. 1#DB.  - standing AAROM L shoulder FIR with pulley, 1x10 with 10 second hold.  - seated AAROM L shoulder FIR with pulley, 1x2 with 10 second hold to demonstrate how to do seated.  - seated L shoulder AAROM ER with cane, elbow propped at about 70 degrees scaption, 1x10 with 10 second holds. (Pt greatly prefers to supine due to feeling he is moving further).  - standing AAROM L shoulder flexion on wall, 1 rep (unable to get above 90 degrees, discontinued).  - standing L shoulder isometric exercises, 1x2-5 with 5 second holds in the following directions: flexion, IR, ER. (Good carry over).  - sidelying L shoulder ER to shoulder abduction AROM, 1x2 (discontinued due to pain/difficulty with abduction).  - sidelying L shoulder ER AROM, 2x10. Progressed to 5 second hold.  - standing isometric L shoulder abduction at elbow, 1x10 with 5 second holds.  - Education on HEP including handout   Pt required multimodal cuing for proper technique and to facilitate improved neuromuscular control, strength, range of motion, and functional ability resulting in improved performance and form.   PATIENT EDUCATION: Education details: Exercise purpose/form. Self management techniques.  Person educated:  Patient Education method: Explanation, Demonstration, Tactile cues, Verbal cues, and Handouts Education comprehension: verbalized understanding, returned demonstration, and needs further education   HOME EXERCISE PROGRAM: Access Code: [email or web address] URL: [email or web address] Date: 09/14/2022 Prepared by: Rosita Kea  Exercises - Supine Shoulder External Rotation in 45 Degrees Abduction AAROM with [place]  - 3 x daily - 1 sets - 10 reps - 10 seconds hold - Seated Shoulder Flexion AAROM with Pulley Behind  - 3 x daily - 1 sets - 10-20 reps - 5-10 seconds hold - Seated Shoulder Abduction AAROM with Pulley Behind  - 3 x daily - 1 sets - 10-20 reps - 5-10 seconds hold - Seated Shoulder Internal Rotation AAROM with Pulley (Mirrored)  - 2 x daily - 10 reps - 10 seconds hold - Standing Isometric Shoulder Flexion with Doorway - Arm Bent  - 2 x daily - 1 sets - 10 reps - 5 seconds hold - Standing Isometric Shoulder Internal Rotation at Doorway  - 2 x daily - 1 sets - 10 reps - 5 seconds hold - Standing Isometric Shoulder External Rotation with Doorway  - 2 x daily - 1 sets - 10 reps - 5 seconds hold - Standing Isometric Shoulder Abduction with Doorway - Arm Bent  - 2 x daily - 1 sets - 10 reps -  5 seconds hold    ASSESSMENT:   CLINICAL IMPRESSION: Patient arrives reporting good tolerance to HEP and improving L shoulder AAROM. He does not report performing HEP as often as recommended but is getting good results and tolerance so far. Encouraged him to increase frequency of pulley exercises. Patient also needed reminder to do AAROM to abduction direction. Today's session continued to focus on ROM and submaximal strengthening and working towards AROM of L shoulder. Patient tolerated treatment well with no increase in pain by end of session. Patient would benefit from continued management of limiting condition by skilled physical therapist to address remaining impairments and functional limitations  to work towards stated goals and return to PLOF or maximal functional independence.  From initial PT eval 09/07/2022: Patient is a 77 y.o. male referred to outpatient physical therapy with a medical diagnosis of 4-part fracture of surgical neck of left humerus who presents with signs and symptoms consistent with R shoulder pain, stiffness, and weakness, and L UE swelling 2/2 proximal humerus fracture on 07/24/2022 after [place]. Patient presents with significant swelling, pain, joint stiffness, guarding, motor control, muscle tension, muscle performance (strength/power/endurance), swelling, tissue integrity, and activity tolerance impairments that are limiting ability to complete any activities that require use of left UE, showering, washing dishes, dressing, hygiene, working out at [employer] for health, sleeping, housework, cooking, throwing balls, some things he does left handed without difficulty. He currently has significant limitations due to pain with movement overhead with empty end feel with flexion and abduction. Patient will benefit from skilled physical therapy intervention to address current body structure impairments and activity limitations to improve function and work towards goals set in current POC in order to return to prior level of function or maximal functional improvement.    OBJECTIVE IMPAIRMENTS: decreased activity tolerance, decreased coordination, decreased endurance, decreased knowledge of condition, decreased mobility, decreased ROM, decreased strength, hypomobility, increased edema, increased fascial restrictions, impaired perceived functional ability, increased muscle spasms, impaired flexibility, impaired UE functional use, improper body mechanics, and pain.    ACTIVITY LIMITATIONS: carrying, lifting, sleeping, bathing, dressing, reach over head, hygiene/grooming, and caring for others   PARTICIPATION LIMITATIONS: meal prep, cleaning, interpersonal relationship, shopping, community  activity, occupation, yard work, and   difficulty completing any activities that require use of left UE, showering, washing dishes, dressing, hygiene, working out at [employer] for health, sleeping, housework, cooking, throwing balls, some things he does left handed   PERSONAL FACTORS: Age, Past/current experiences, Time since onset of injury/illness/exacerbation, and 3+ comorbidities:   prostate cancer (dx Fall 2021, 40 radiation treatments in summer 2022, now doing well followed 1x a year), HTN, hyperlipidemia, asthma (as a child), ORIF L tibial Plateau (08/03/2019), OA, appendectomy, skin cancer (removed) are also affecting patient's functional outcome.    REHAB POTENTIAL: Good   CLINICAL DECISION MAKING: Stable/uncomplicated   EVALUATION COMPLEXITY: Low     GOALS: Goals reviewed with patient? No   SHORT TERM GOALS: Target date: 09/21/2022   Patient will be independent with initial home exercise program for self-management of symptoms. Baseline: Initial HEP provided at IE (09/07/22); Goal status: In-progress     LONG TERM GOALS: Target date: 11/30/2022   Patient will be independent with a long-term home exercise program for self-management of symptoms.  Baseline: Initial HEP provided at IE (09/07/22); Goal status: In-progress   2.  Patient will demonstrate improved FOTO to equal or greater than 67 by visit #12 to demonstrate improvement in overall condition and self-reported functional ability.  Baseline: 44 (09/07/22); Goal status: In-progress   3.  Patient will demonstrate L shoulder AROM equal or greater than 130 degrees flexion, 110 degrees abduction,  80 degrees ER at 90 degrees abduction, and IR to L1 to improve his ability to dress, reach overhead, and throw balls.  Baseline: very limited  - see objective (09/07/22); Goal status: INITIAL   4.  Patient will demonstrate L shoulder strength equal or greater than 4/5 MMT in flexion, abduction, ER, and IR to improve his ability to  reach overhead, throw a ball, and dress. Baseline: testing deferred due to acuity of injury (09/07/22); Goal status: In-progress   5.  Patient will complete community, work and/or recreational activities with equal or less than 75% limitation due to current condition.  Baseline: difficulty completing any activities that require use of left UE, showering, washing dishes, dressing, hygiene, working out at [employer] for health, sleeping, housework, cooking, throwing balls, some things he does left handed (09/07/22); Goal status: In-progress   PLAN:   PT FREQUENCY: 1-2x/week   PT DURATION: 12 weeks   PLANNED INTERVENTIONS: Therapeutic exercises, Therapeutic activity, Neuromuscular re-education, Balance training, Gait training, Patient/Family education, Self Care, Joint mobilization, Dry Needling, Electrical stimulation, Spinal mobilization, Cryotherapy, Moist heat, Manual therapy, and Re-evaluation   PLAN FOR NEXT SESSION: Update HEP as appropriate, progressive PROM, AAROM, strengthening for posture, shoulder girdle, and UE. Manual therapy for pain control, improved ROM.    Nancy Nordmann, PT, DPT 09/14/2022, [facility] [address] [place], [place] [address] P: [phone] I F: [phone]

## 2022-09-17 ENCOUNTER — Encounter: Payer: Self-pay | Admitting: Physical Therapy

## 2022-09-17 ENCOUNTER — Ambulatory Visit: Payer: PPO | Admitting: Physical Therapy

## 2022-09-17 DIAGNOSIS — M25612 Stiffness of left shoulder, not elsewhere classified: Secondary | ICD-10-CM

## 2022-09-17 DIAGNOSIS — M79622 Pain in left upper arm: Secondary | ICD-10-CM

## 2022-09-17 DIAGNOSIS — M6281 Muscle weakness (generalized): Secondary | ICD-10-CM

## 2022-09-17 DIAGNOSIS — M25512 Pain in left shoulder: Secondary | ICD-10-CM

## 2022-09-17 NOTE — Therapy (Signed)
OUTPATIENT PHYSICAL THERAPY TREATMENT NOTE   Patient Name: Glenn Watson MRN: [id number] DOB:Jul 17, 1945, 77 y.o., male Today's Date: 09/17/2022  PCP: Idelle Crouch, MD REFERRING PROVIDER: Earnestine Leys, MD  END OF SESSION:   PT End of Session - 09/17/22 0951     Visit Number 4    Number of Visits 24    Date for PT Re-Evaluation 11/30/22    Authorization Type [employer] reporting period from 09/07/2022    Progress Note Due on Visit 10    PT Start Time 0948    PT Stop Time 1026    PT Time Calculation (min) 38 min    Activity Tolerance Patient tolerated treatment well;Patient limited by pain    Behavior During Therapy [facility] for tasks assessed/performed              Past Medical History:  Diagnosis Date   Anemia    Asthma    as a child, exercise asthma - none since high school"   Cancer ([place])    skin cancer - basal, squamous- Mylenoma - stage 2- chest   Complication of anesthesia    "very sensitive to medications" "Had colonscopy with just Draminine"   Family history of adverse reaction to anesthesia    daughter- N/V   Hyperlipidemia    Hypertension    PONV (postoperative nausea and vomiting)    Past Surgical History:  Procedure Laterality Date   APPENDECTOMY  2011   Dr. Bary Castilla   COLONOSCOPY WITH PROPOFOL N/A 02/10/2017   Procedure: COLONOSCOPY WITH PROPOFOL;  Surgeon: Christene Lye, MD;  Location: [facility] END[facility];  Service: Endoscopy;  Laterality: N/A;   ORIF TIBIA PLATEAU Left 08/03/2019   Procedure: OPEN REDUCTION INTERNAL FIXATION (ORIF) TIBIAL PLATEAU;  Surgeon: Altamese Rockfish, MD;  Location: [place];  Service: Orthopedics;  Laterality: Left;   Patient Active Problem List   Diagnosis Date Noted   Prostate cancer ([place]) 03/04/2021   Hypertension    Hyperlipidemia    Asthma    Tibial plateau fracture, left, closed, with nonunion, subsequent encounter 08/03/2019   Closed fracture of shaft of tibia 03/28/2019   Retrocalcaneal bursitis,  right 04/05/2018   Chronic anemia 11/14/2013   OA (osteoarthritis) 11/14/2013    REFERRING DIAG: 4-part fracture of surgical neck of left humerus  THERAPY DIAG:  Pain in left upper arm  Stiffness of left shoulder, not elsewhere classified  Left shoulder pain, unspecified chronicity  Muscle weakness (generalized)  Rationale for Evaluation and Treatment: Rehabilitation  PERTINENT HISTORY: Patient is a 77 y.o. male who presents to outpatient physical therapy with a referral for medical diagnosis 4-part fracture of surgical neck of left humerus. This patient's chief complaints consist of left upper arm and shoulder pain, shoulder stiffness, and L UE weakness/swelling, leading to the following functional deficits: difficulty completing any activities that require use of left UE, showering, washing dishes, dressing, hygiene, working out at [employer] for health, sleeping, housework, cooking, throwing balls, some things he does left handed. Relevant past medical history and comorbidities include prostate cancer (dx Fall 2021, 40 radiation treatments in summer 2022, now doing well followed 1x a year), HTN, hyperlipidemia, asthma (as a child), ORIF L tibial Plateau (08/03/2019), OA, appendectomy, skin cancer (removed).  Patient denies hx of stroke, seizures, lung problems, heart problems, diabetes, unexplained weight loss, unexplained changes in bowel or bladder problems, unexplained stumbling or dropping things, osteoporosis, and spinal surgery   PRECAUTIONS: fracture 07/24/2022  SUBJECTIVE:  SUBJECTIVE STATEMENT:  Patient reports he had a milestone yesterday where he was able to floss his teeth the normal way. He took an extra strength tylenol this morning because he was extra sore after pushing his HEP harder yesterday.  He  reports his left shoulder is sore when he moves it. He is still wearing his sling at night and he is not sleeping well which makes him a little sore when he wakes up.    PAIN:  Are you having pain? NPRS 0/10 left shoulder/upper arm at rest but "sore" with motion   OBJECTIVE:   AAROM L shoulder Flexion: 122 Abduction: 136 (slight scaption plane) FIR: T10  TODAY'S TREATMENT:  Therapeutic exercise: to centralize symptoms and improve ROM, strength, muscular endurance, and activity tolerance required for successful completion of functional activities.  - seated AAROM L shoulder flexion with pulley, 1x10 with 10 second holds.  - seated AAROM L shoulder abduction with pulley, 1x10 with 10 second holds.  - seated  AAROM L shoulder FIR with pulley, 1x10 with 10 second hold.  - seated dumbbell passes behind back, 1x10 each direction. 3#DB.  - hooklying chest press with PVC stick, 1x2 (discontinued due to heavy pain especially with eccentric phase).  - Hooklying L shoulder PROM to 90 degrees flexion with attempted holds with intermittant  light support from PT, 1x10 with 5 second holds. Discontinued when numbness reported to increase in hand.  - Hooklying L shoulder ER/IR manual purturbations  at approx 30 degrees scaption supported on towel roll, 2x30 seconds (well tolerated).  - hooklying L shoulder ER/IR AROM with elbow propped on towel roll at approx 30 degrees scaption, 3x10 (well tolerated).   Pt required multimodal cuing for proper technique and to facilitate improved neuromuscular control, strength, range of motion, and functional ability resulting in improved performance and form.   PATIENT EDUCATION: Education details: Exercise purpose/form. Self management techniques.  Person educated: Patient Education method: Explanation, Demonstration, Tactile cues, Verbal cues, and Handouts Education comprehension: verbalized understanding, returned demonstration, and needs further education    HOME EXERCISE PROGRAM: Access Code: [email or web address] URL: [email or web address] Date: 09/14/2022 Prepared by: Rosita Kea  Exercises - Supine Shoulder External Rotation in 45 Degrees Abduction AAROM with [place]  - 3 x daily - 1 sets - 10 reps - 10 seconds hold - Seated Shoulder Flexion AAROM with Pulley Behind  - 3 x daily - 1 sets - 10-20 reps - 5-10 seconds hold - Seated Shoulder Abduction AAROM with Pulley Behind  - 3 x daily - 1 sets - 10-20 reps - 5-10 seconds hold - Seated Shoulder Internal Rotation AAROM with Pulley (Mirrored)  - 2 x daily - 10 reps - 10 seconds hold - Standing Isometric Shoulder Flexion with Doorway - Arm Bent  - 2 x daily - 1 sets - 10 reps - 5 seconds hold - Standing Isometric Shoulder Internal Rotation at Doorway  - 2 x daily - 1 sets - 10 reps - 5 seconds hold - Standing Isometric Shoulder External Rotation with Doorway  - 2 x daily - 1 sets - 10 reps - 5 seconds hold - Standing Isometric Shoulder Abduction with Doorway - Arm Bent  - 2 x daily - 1 sets - 10 reps - 5 seconds hold    ASSESSMENT:   CLINICAL IMPRESSION: Patient arrives reporting good participation in HEP and tolerance to last PT session and HEP. Continued with exercises for improved ROM and strength for AROM. Patient continues to be limited by pain so  exercises kept within pain tolerance. Patient would benefit from continued management of limiting condition by skilled physical therapist to address remaining impairments and functional limitations to work towards stated goals and return to PLOF or maximal functional independence.   From initial PT eval 09/07/2022: Patient is a 77 y.o. male referred to outpatient physical therapy with a medical diagnosis of 4-part fracture of surgical neck of left humerus who presents with signs and symptoms consistent with R shoulder pain, stiffness, and weakness, and L UE swelling 2/2 proximal humerus fracture on 07/24/2022 after [place]. Patient presents with  significant swelling, pain, joint stiffness, guarding, motor control, muscle tension, muscle performance (strength/power/endurance), swelling, tissue integrity, and activity tolerance impairments that are limiting ability to complete any activities that require use of left UE, showering, washing dishes, dressing, hygiene, working out at [employer] for health, sleeping, housework, cooking, throwing balls, some things he does left handed without difficulty. He currently has significant limitations due to pain with movement overhead with empty end feel with flexion and abduction. Patient will benefit from skilled physical therapy intervention to address current body structure impairments and activity limitations to improve function and work towards goals set in current POC in order to return to prior level of function or maximal functional improvement.    OBJECTIVE IMPAIRMENTS: decreased activity tolerance, decreased coordination, decreased endurance, decreased knowledge of condition, decreased mobility, decreased ROM, decreased strength, hypomobility, increased edema, increased fascial restrictions, impaired perceived functional ability, increased muscle spasms, impaired flexibility, impaired UE functional use, improper body mechanics, and pain.    ACTIVITY LIMITATIONS: carrying, lifting, sleeping, bathing, dressing, reach over head, hygiene/grooming, and caring for others   PARTICIPATION LIMITATIONS: meal prep, cleaning, interpersonal relationship, shopping, community activity, occupation, yard work, and   difficulty completing any activities that require use of left UE, showering, washing dishes, dressing, hygiene, working out at [employer] for health, sleeping, housework, cooking, throwing balls, some things he does left handed   PERSONAL FACTORS: Age, Past/current experiences, Time since onset of injury/illness/exacerbation, and 3+ comorbidities:   prostate cancer (dx Fall 2021, 40 radiation treatments in summer  2022, now doing well followed 1x a year), HTN, hyperlipidemia, asthma (as a child), ORIF L tibial Plateau (08/03/2019), OA, appendectomy, skin cancer (removed) are also affecting patient's functional outcome.    REHAB POTENTIAL: Good   CLINICAL DECISION MAKING: Stable/uncomplicated   EVALUATION COMPLEXITY: Low     GOALS: Goals reviewed with patient? No   SHORT TERM GOALS: Target date: 09/21/2022   Patient will be independent with initial home exercise program for self-management of symptoms. Baseline: Initial HEP provided at IE (09/07/22); Goal status: In-progress     LONG TERM GOALS: Target date: 11/30/2022   Patient will be independent with a long-term home exercise program for self-management of symptoms.  Baseline: Initial HEP provided at IE (09/07/22); Goal status: In-progress   2.  Patient will demonstrate improved FOTO to equal or greater than 67 by visit #12 to demonstrate improvement in overall condition and self-reported functional ability.  Baseline: 44 (09/07/22); Goal status: In-progress   3.  Patient will demonstrate L shoulder AROM equal or greater than 130 degrees flexion, 110 degrees abduction,  80 degrees ER at 90 degrees abduction, and IR to L1 to improve his ability to dress, reach overhead, and throw balls.  Baseline: very limited  - see objective (09/07/22); Goal status: INITIAL   4.  Patient will demonstrate L shoulder strength equal or greater than 4/5 MMT in flexion, abduction, ER, and IR  to improve his ability to reach overhead, throw a ball, and dress. Baseline: testing deferred due to acuity of injury (09/07/22); Goal status: In-progress   5.  Patient will complete community, work and/or recreational activities with equal or less than 75% limitation due to current condition.  Baseline: difficulty completing any activities that require use of left UE, showering, washing dishes, dressing, hygiene, working out at [employer] for health, sleeping, housework,  cooking, throwing balls, some things he does left handed (09/07/22); Goal status: In-progress   PLAN:   PT FREQUENCY: 1-2x/week   PT DURATION: 12 weeks   PLANNED INTERVENTIONS: Therapeutic exercises, Therapeutic activity, Neuromuscular re-education, Balance training, Gait training, Patient/Family education, Self Care, Joint mobilization, Dry Needling, Electrical stimulation, Spinal mobilization, Cryotherapy, Moist heat, Manual therapy, and Re-evaluation   PLAN FOR NEXT SESSION: Update HEP as appropriate, progressive PROM, AAROM, strengthening for posture, shoulder girdle, and UE. Manual therapy for pain control, improved ROM.    Nancy Nordmann, PT, DPT 09/17/2022, 10:31 AM  [facility] [address] [place], [place] [address] P: [phone] I F: [phone]

## 2022-09-21 ENCOUNTER — Ambulatory Visit: Payer: PPO | Admitting: Physical Therapy

## 2022-09-21 DIAGNOSIS — S42242A 4-part fracture of surgical neck of left humerus, initial encounter for closed fracture: Secondary | ICD-10-CM | POA: Diagnosis not present

## 2022-09-22 ENCOUNTER — Ambulatory Visit: Payer: PPO | Admitting: Physical Therapy

## 2022-09-22 ENCOUNTER — Encounter: Payer: Self-pay | Admitting: Physical Therapy

## 2022-09-22 DIAGNOSIS — M25512 Pain in left shoulder: Secondary | ICD-10-CM

## 2022-09-22 DIAGNOSIS — M25612 Stiffness of left shoulder, not elsewhere classified: Secondary | ICD-10-CM

## 2022-09-22 DIAGNOSIS — M6281 Muscle weakness (generalized): Secondary | ICD-10-CM

## 2022-09-22 DIAGNOSIS — M79622 Pain in left upper arm: Secondary | ICD-10-CM

## 2022-09-22 NOTE — Therapy (Signed)
OUTPATIENT PHYSICAL THERAPY TREATMENT NOTE   Patient Name: Glenn Watson MRN: [id number] DOB:05-29-1946, 77 y.o., male Today's Date: 09/22/2022  PCP: Idelle Crouch, MD REFERRING PROVIDER: Earnestine Leys, MD  END OF SESSION:   PT End of Session - 09/22/22 1401     Visit Number 5    Number of Visits 24    Date for PT Re-Evaluation 11/30/22    Authorization Type [employer] reporting period from 09/07/2022    Progress Note Due on Visit 10    PT Start Time 1347    PT Stop Time 1425    PT Time Calculation (min) 38 min    Activity Tolerance Patient tolerated treatment well;Patient limited by pain    Behavior During Therapy WFL for tasks assessed/performed               Past Medical History:  Diagnosis Date   Anemia    Asthma    as a child, exercise asthma - none since high school"   Cancer ([place])    skin cancer - basal, squamous- Mylenoma - stage 2- chest   Complication of anesthesia    "very sensitive to medications" "Had colonscopy with just Draminine"   Family history of adverse reaction to anesthesia    daughter- N/V   Hyperlipidemia    Hypertension    PONV (postoperative nausea and vomiting)    Past Surgical History:  Procedure Laterality Date   APPENDECTOMY  2011   Dr. Bary Castilla   COLONOSCOPY WITH PROPOFOL N/A 02/10/2017   Procedure: COLONOSCOPY WITH PROPOFOL;  Surgeon: Christene Lye, MD;  Location: [facility];  Service: Endoscopy;  Laterality: N/A;   ORIF TIBIA PLATEAU Left 08/03/2019   Procedure: OPEN REDUCTION INTERNAL FIXATION (ORIF) TIBIAL PLATEAU;  Surgeon: Altamese Maben, MD;  Location: [place];  Service: Orthopedics;  Laterality: Left;   Patient Active Problem List   Diagnosis Date Noted   Prostate cancer ([place]) 03/04/2021   Hypertension    Hyperlipidemia    Asthma    Tibial plateau fracture, left, closed, with nonunion, subsequent encounter 08/03/2019   Closed fracture of shaft of tibia 03/28/2019   Retrocalcaneal bursitis,  right 04/05/2018   Chronic anemia 11/14/2013   OA (osteoarthritis) 11/14/2013    REFERRING DIAG: 4-part fracture of surgical neck of left humerus  THERAPY DIAG:  Pain in left upper arm  Stiffness of left shoulder, not elsewhere classified  Left shoulder pain, unspecified chronicity  Muscle weakness (generalized)  Rationale for Evaluation and Treatment: Rehabilitation  PERTINENT HISTORY: Patient is a 77 y.o. male who presents to outpatient physical therapy with a referral for medical diagnosis 4-part fracture of surgical neck of left humerus. This patient's chief complaints consist of left upper arm and shoulder pain, shoulder stiffness, and L UE weakness/swelling, leading to the following functional deficits: difficulty completing any activities that require use of left UE, showering, washing dishes, dressing, hygiene, working out at [employer] for health, sleeping, housework, cooking, throwing balls, some things he does left handed. Relevant past medical history and comorbidities include prostate cancer (dx Fall 2021, 40 radiation treatments in summer 2022, now doing well followed 1x a year), HTN, hyperlipidemia, asthma (as a child), ORIF L tibial Plateau (08/03/2019), OA, appendectomy, skin cancer (removed).  Patient denies hx of stroke, seizures, lung problems, heart problems, diabetes, unexplained weight loss, unexplained changes in bowel or bladder problems, unexplained stumbling or dropping things, osteoporosis, and spinal surgery   PRECAUTIONS: fracture 07/24/2022  SUBJECTIVE:  SUBJECTIVE STATEMENT:  Patient reports he is feeling well. He saw Dr. Sabra Heck who took xrays and states his shoulder is pretty much healed. He brought in print out of two views with shoulder internally and externally rotated (see scan in  chart). Patient said Dr. Sabra Heck was pleased with his ability to abduct his L shoulder. His next follow up with Dr. Sabra Heck is in 8 weeks.    PAIN:  Are you having pain? NPRS 0/10 left shoulder/upper arm at rest but "sore" after doing HEP.    OBJECTIVE:   AAROM L shoulder Flexion: 125 Abduction: 130 (slight scaption plane) FIR: T10  TODAY'S TREATMENT:  Therapeutic exercise: to centralize symptoms and improve ROM, strength, muscular endurance, and activity tolerance required for successful completion of functional activities.  - seated AAROM L shoulder flexion with pulley, 1x10 with 10 second holds.  - seated AAROM L shoulder abduction with pulley, 1x10 with 10 second holds.  - seated  AAROM L shoulder FIR with pulley, 1x10 with 10 second hold.  - standing L shoulder AAROM wall slide, one rep attempted and too difficulty and painful to complete multiple despite PT assistance.  - supine L shoulder AAROM flexion with PVC stick, towel roll under left arm at rest due to increased pain/difficulty moving arm from side of body to table, 5-10 second holds. 1x10.  - supine B shoulder serratus punch holding PVC stick, 1x10   Pt required multimodal cuing for proper technique and to facilitate improved neuromuscular control, strength, range of motion, and functional ability resulting in improved performance and form.   PATIENT EDUCATION: Education details: Exercise purpose/form. Self management techniques.  Person educated: Patient Education method: Explanation, Demonstration, Tactile cues, Verbal cues, and Handouts Education comprehension: verbalized understanding, returned demonstration, and needs further education   HOME EXERCISE PROGRAM: Access Code: [email or web address] URL: [email or web address] Date: 09/22/2022 Prepared by: Rosita Kea  Exercises - Supine Shoulder External Rotation in 45 Degrees Abduction AAROM with Dowel  - 3 x daily - 1 sets - 10 reps - 10 seconds hold - Seated  Shoulder Flexion AAROM with Pulley Behind  - 3 x daily - 1 sets - 10-20 reps - 5-10 seconds hold - Seated Shoulder Abduction AAROM with Pulley Behind  - 3 x daily - 1 sets - 10-20 reps - 5-10 seconds hold - Seated Shoulder Internal Rotation AAROM with Pulley (Mirrored)  - 2 x daily - 10 reps - 10 seconds hold - Standing Isometric Shoulder Flexion with Doorway - Arm Bent  - 2 x daily - 1 sets - 10 reps - 5 seconds hold - Standing Isometric Shoulder Internal Rotation at Doorway  - 2 x daily - 1 sets - 10 reps - 5 seconds hold - Standing Isometric Shoulder External Rotation with Doorway  - 2 x daily - 1 sets - 10 reps - 5 seconds hold - Standing Isometric Shoulder Abduction with Doorway - Arm Bent  - 2 x daily - 1 sets - 10 reps - 5 seconds hold - Supine Shoulder Flexion with Dowel  - 2 x daily - 1 sets - 10 reps - 5 seconds hold - Supine Shoulder Protraction with Dowel  - 2 x daily - 1 sets - 10 reps    ASSESSMENT:   CLINICAL IMPRESSION: Patient arrives with good tolerance to last PT session and HEP and report off referring MD being pleased at his last follow up session. Continued with tolerated exercises for ROM and strengthening to improve A/PROM and function. Patient continues to have  end range pain and pain with attempts to more actively move arm over head. He was able to move his shoulder through AAROM flexion with PVC stick and complete serratus punches for the first time today. Patient would benefit from continued management of limiting condition by skilled physical therapist to address remaining impairments and functional limitations to work towards stated goals and return to PLOF or maximal functional independence.   From initial PT eval 09/07/2022: Patient is a 77 y.o. male referred to outpatient physical therapy with a medical diagnosis of 4-part fracture of surgical neck of left humerus who presents with signs and symptoms consistent with R shoulder pain, stiffness, and weakness, and L UE  swelling 2/2 proximal humerus fracture on 07/24/2022 after [place]. Patient presents with significant swelling, pain, joint stiffness, guarding, motor control, muscle tension, muscle performance (strength/power/endurance), swelling, tissue integrity, and activity tolerance impairments that are limiting ability to complete any activities that require use of left UE, showering, washing dishes, dressing, hygiene, working out at [employer] for health, sleeping, housework, cooking, throwing balls, some things he does left handed without difficulty. He currently has significant limitations due to pain with movement overhead with empty end feel with flexion and abduction. Patient will benefit from skilled physical therapy intervention to address current body structure impairments and activity limitations to improve function and work towards goals set in current POC in order to return to prior level of function or maximal functional improvement.    OBJECTIVE IMPAIRMENTS: decreased activity tolerance, decreased coordination, decreased endurance, decreased knowledge of condition, decreased mobility, decreased ROM, decreased strength, hypomobility, increased edema, increased fascial restrictions, impaired perceived functional ability, increased muscle spasms, impaired flexibility, impaired UE functional use, improper body mechanics, and pain.    ACTIVITY LIMITATIONS: carrying, lifting, sleeping, bathing, dressing, reach over head, hygiene/grooming, and caring for others   PARTICIPATION LIMITATIONS: meal prep, cleaning, interpersonal relationship, shopping, community activity, occupation, yard work, and   difficulty completing any activities that require use of left UE, showering, washing dishes, dressing, hygiene, working out at [employer] for health, sleeping, housework, cooking, throwing balls, some things he does left handed   PERSONAL FACTORS: Age, Past/current experiences, Time since onset of injury/illness/exacerbation,  and 3+ comorbidities:   prostate cancer (dx Fall 2021, 40 radiation treatments in summer 2022, now doing well followed 1x a year), HTN, hyperlipidemia, asthma (as a child), ORIF L tibial Plateau (08/03/2019), OA, appendectomy, skin cancer (removed) are also affecting patient's functional outcome.    REHAB POTENTIAL: Good   CLINICAL DECISION MAKING: Stable/uncomplicated   EVALUATION COMPLEXITY: Low     GOALS: Goals reviewed with patient? No   SHORT TERM GOALS: Target date: 09/21/2022   Patient will be independent with initial home exercise program for self-management of symptoms. Baseline: Initial HEP provided at IE (09/07/22); Goal status: In-progress     LONG TERM GOALS: Target date: 11/30/2022   Patient will be independent with a long-term home exercise program for self-management of symptoms.  Baseline: Initial HEP provided at IE (09/07/22); Goal status: In-progress   2.  Patient will demonstrate improved FOTO to equal or greater than 67 by visit #12 to demonstrate improvement in overall condition and self-reported functional ability.  Baseline: 44 (09/07/22); Goal status: In-progress   3.  Patient will demonstrate L shoulder AROM equal or greater than 130 degrees flexion, 110 degrees abduction,  80 degrees ER at 90 degrees abduction, and IR to L1 to improve his ability to dress, reach overhead, and throw balls.  Baseline: very  limited  - see objective (09/07/22); Goal status: INITIAL   4.  Patient will demonstrate L shoulder strength equal or greater than 4/5 MMT in flexion, abduction, ER, and IR to improve his ability to reach overhead, throw a ball, and dress. Baseline: testing deferred due to acuity of injury (09/07/22); Goal status: In-progress   5.  Patient will complete community, work and/or recreational activities with equal or less than 75% limitation due to current condition.  Baseline: difficulty completing any activities that require use of left UE, showering, washing  dishes, dressing, hygiene, working out at [employer] for health, sleeping, housework, cooking, throwing balls, some things he does left handed (09/07/22); Goal status: In-progress   PLAN:   PT FREQUENCY: 1-2x/week   PT DURATION: 12 weeks   PLANNED INTERVENTIONS: Therapeutic exercises, Therapeutic activity, Neuromuscular re-education, Balance training, Gait training, Patient/Family education, Self Care, Joint mobilization, Dry Needling, Electrical stimulation, Spinal mobilization, Cryotherapy, Moist heat, Manual therapy, and Re-evaluation   PLAN FOR NEXT SESSION: Update HEP as appropriate, progressive PROM, AAROM, strengthening for posture, shoulder girdle, and UE. Manual therapy for pain control, improved ROM.    Nancy Nordmann, PT, DPT 09/22/2022, 3:03 PM  [facility] [address] [place], [place] [address] P: [phone] I F: [phone]

## 2022-09-24 ENCOUNTER — Ambulatory Visit: Payer: PPO | Admitting: Physical Therapy

## 2022-09-24 ENCOUNTER — Encounter: Payer: Self-pay | Admitting: Physical Therapy

## 2022-09-24 DIAGNOSIS — M79622 Pain in left upper arm: Secondary | ICD-10-CM

## 2022-09-24 DIAGNOSIS — M25512 Pain in left shoulder: Secondary | ICD-10-CM

## 2022-09-24 DIAGNOSIS — M6281 Muscle weakness (generalized): Secondary | ICD-10-CM

## 2022-09-24 DIAGNOSIS — M25612 Stiffness of left shoulder, not elsewhere classified: Secondary | ICD-10-CM

## 2022-09-24 NOTE — Therapy (Signed)
OUTPATIENT PHYSICAL THERAPY TREATMENT NOTE   Patient Name: Glenn Watson MRN: [id number] DOB:1946-05-15, 77 y.o., male Today's Date: 09/24/2022  PCP: Idelle Crouch, MD REFERRING PROVIDER: Earnestine Leys, MD  END OF SESSION:   PT End of Session - 09/24/22 1146     Visit Number 6    Number of Visits 24    Date for PT Re-Evaluation 11/30/22    Authorization Type [employer] reporting period from 09/07/2022    Progress Note Due on Visit 10    PT Start Time 1118    PT Stop Time 1156    PT Time Calculation (min) 38 min    Activity Tolerance Patient tolerated treatment well;Patient limited by pain    Behavior During Therapy WFL for tasks assessed/performed               Past Medical History:  Diagnosis Date   Anemia    Asthma    as a child, exercise asthma - none since high school"   Cancer ([place])    skin cancer - basal, squamous- Mylenoma - stage 2- chest   Complication of anesthesia    "very sensitive to medications" "Had colonscopy with just Draminine"   Family history of adverse reaction to anesthesia    daughter- N/V   Hyperlipidemia    Hypertension    PONV (postoperative nausea and vomiting)    Past Surgical History:  Procedure Laterality Date   APPENDECTOMY  2011   Dr. Bary Castilla   COLONOSCOPY WITH PROPOFOL N/A 02/10/2017   Procedure: COLONOSCOPY WITH PROPOFOL;  Surgeon: Christene Lye, MD;  Location: [facility];  Service: Endoscopy;  Laterality: N/A;   ORIF TIBIA PLATEAU Left 08/03/2019   Procedure: OPEN REDUCTION INTERNAL FIXATION (ORIF) TIBIAL PLATEAU;  Surgeon: Altamese Spur, MD;  Location: [place];  Service: Orthopedics;  Laterality: Left;   Patient Active Problem List   Diagnosis Date Noted   Prostate cancer ([employer]) 03/04/2021   Hypertension    Hyperlipidemia    Asthma    Tibial plateau fracture, left, closed, with nonunion, subsequent encounter 08/03/2019   Closed fracture of shaft of tibia 03/28/2019   Retrocalcaneal bursitis,  right 04/05/2018   Chronic anemia 11/14/2013   OA (osteoarthritis) 11/14/2013    REFERRING DIAG: 4-part fracture of surgical neck of left humerus  THERAPY DIAG:  Pain in left upper arm  Stiffness of left shoulder, not elsewhere classified  Left shoulder pain, unspecified chronicity  Muscle weakness (generalized)  Rationale for Evaluation and Treatment: Rehabilitation  PERTINENT HISTORY: Patient is a 77 y.o. male who presents to outpatient physical therapy with a referral for medical diagnosis 4-part fracture of surgical neck of left humerus. This patient's chief complaints consist of left upper arm and shoulder pain, shoulder stiffness, and L UE weakness/swelling, leading to the following functional deficits: difficulty completing any activities that require use of left UE, showering, washing dishes, dressing, hygiene, working out at [employer] for health, sleeping, housework, cooking, throwing balls, some things he does left handed. Relevant past medical history and comorbidities include prostate cancer (dx Fall 2021, 40 radiation treatments in summer 2022, now doing well followed 1x a year), HTN, hyperlipidemia, asthma (as a child), ORIF L tibial Plateau (08/03/2019), OA, appendectomy, skin cancer (removed).  Patient denies hx of stroke, seizures, lung problems, heart problems, diabetes, unexplained weight loss, unexplained changes in bowel or bladder problems, unexplained stumbling or dropping things, osteoporosis, and spinal surgery   PRECAUTIONS: fracture 07/24/2022  SUBJECTIVE:  SUBJECTIVE STATEMENT:  Patient reports he is feeling like he is not having anything extra today. He did some exercises yesterday. He did his HEP supine PVC stick exercises, the AAROM Flexion exercises with the pulleys. However, he could not  do more than two abduction AAROM because of pain on return. He could not do any exercises behind his back. He used some asper cream on his arm. He also took 500mg  tylenol. He states the pain went away in 30-40 min but it is still sore. He states he usually has more time between PT appointments but today is closer together, so he is not sure how much he can do today.    PAIN:  Are you having pain? NPRS 1/10 left shoulder/upper arm.   OBJECTIVE:   AAROM L shoulder (last measured 09/22/22) Flexion: 125 Abduction: 130 (slight scaption plane) FIR: T10  TODAY'S TREATMENT:  Therapeutic exercise: to centralize symptoms and improve ROM, strength, muscular endurance, and activity tolerance required for successful completion of functional activities.  - seated AAROM L shoulder flexion with pulley, 1x10 with 10 second holds.  - seated AAROM L shoulder abduction with pulley, 1x10 with 10 second holds.  - seated  AAROM L shoulder FIR with pulley, 1x10 with 10 second hold.  - supine L shoulder AAROM flexion with PVC stick, yoga block  under left arm at rest due to increased pain/difficulty moving arm from side of body to table, 5-10 second holds. 2x10.  - supine B shoulder serratus punch holding PVC stick, 4x7, 5 second holds.   Pt required multimodal cuing for proper technique and to facilitate improved neuromuscular control, strength, range of motion, and functional ability resulting in improved performance and form.   PATIENT EDUCATION: Education details: Exercise purpose/form. Self management techniques.  Person educated: Patient Education method: Explanation, Demonstration, Tactile cues, Verbal cues, and Handouts Education comprehension: verbalized understanding, returned demonstration, and needs further education   HOME EXERCISE PROGRAM: Access Code: [email or web address] URL: [email or web address] Date: 09/22/2022 Prepared by: Rosita Kea  Exercises - Supine Shoulder External Rotation in  45 Degrees Abduction AAROM with Dowel  - 3 x daily - 1 sets - 10 reps - 10 seconds hold - Seated Shoulder Flexion AAROM with Pulley Behind  - 3 x daily - 1 sets - 10-20 reps - 5-10 seconds hold - Seated Shoulder Abduction AAROM with Pulley Behind  - 3 x daily - 1 sets - 10-20 reps - 5-10 seconds hold - Seated Shoulder Internal Rotation AAROM with Pulley (Mirrored)  - 2 x daily - 10 reps - 10 seconds hold - Standing Isometric Shoulder Flexion with Doorway - Arm Bent  - 2 x daily - 1 sets - 10 reps - 5 seconds hold - Standing Isometric Shoulder Internal Rotation at Doorway  - 2 x daily - 1 sets - 10 reps - 5 seconds hold - Standing Isometric Shoulder External Rotation with Doorway  - 2 x daily - 1 sets - 10 reps - 5 seconds hold - Standing Isometric Shoulder Abduction with Doorway - Arm Bent  - 2 x daily - 1 sets - 10 reps - 5 seconds hold - Supine Shoulder Flexion with Dowel  - 2 x daily - 1 sets - 10 reps - 5 seconds hold - Supine Shoulder Protraction with Dowel  - 2 x daily - 1 sets - 10 reps    ASSESSMENT:   CLINICAL IMPRESSION: Patient arrives with concern about pain he had during his HEP yesterday. He was able to continue with  similar exercises to last PT session with increased repetitions today with no concerning increase in pain. He did have some discomfort with FIR AAROM that got better with repetition. He was encouraged to continue with HEP with gentle end range pressure. Patient would benefit from continued management of limiting condition by skilled physical therapist to address remaining impairments and functional limitations to work towards stated goals and return to PLOF or maximal functional independence.   From initial PT eval 09/07/2022: Patient is a 77 y.o. male referred to outpatient physical therapy with a medical diagnosis of 4-part fracture of surgical neck of left humerus who presents with signs and symptoms consistent with R shoulder pain, stiffness, and weakness, and L UE  swelling 2/2 proximal humerus fracture on 07/24/2022 after [place]. Patient presents with significant swelling, pain, joint stiffness, guarding, motor control, muscle tension, muscle performance (strength/power/endurance), swelling, tissue integrity, and activity tolerance impairments that are limiting ability to complete any activities that require use of left UE, showering, washing dishes, dressing, hygiene, working out at [employer] for health, sleeping, housework, cooking, throwing balls, some things he does left handed without difficulty. He currently has significant limitations due to pain with movement overhead with empty end feel with flexion and abduction. Patient will benefit from skilled physical therapy intervention to address current body structure impairments and activity limitations to improve function and work towards goals set in current POC in order to return to prior level of function or maximal functional improvement.    OBJECTIVE IMPAIRMENTS: decreased activity tolerance, decreased coordination, decreased endurance, decreased knowledge of condition, decreased mobility, decreased ROM, decreased strength, hypomobility, increased edema, increased fascial restrictions, impaired perceived functional ability, increased muscle spasms, impaired flexibility, impaired UE functional use, improper body mechanics, and pain.    ACTIVITY LIMITATIONS: carrying, lifting, sleeping, bathing, dressing, reach over head, hygiene/grooming, and caring for others   PARTICIPATION LIMITATIONS: meal prep, cleaning, interpersonal relationship, shopping, community activity, occupation, yard work, and   difficulty completing any activities that require use of left UE, showering, washing dishes, dressing, hygiene, working out at [employer] for health, sleeping, housework, cooking, throwing balls, some things he does left handed   PERSONAL FACTORS: Age, Past/current experiences, Time since onset of injury/illness/exacerbation,  and 3+ comorbidities:   prostate cancer (dx Fall 2021, 40 radiation treatments in summer 2022, now doing well followed 1x a year), HTN, hyperlipidemia, asthma (as a child), ORIF L tibial Plateau (08/03/2019), OA, appendectomy, skin cancer (removed) are also affecting patient's functional outcome.    REHAB POTENTIAL: Good   CLINICAL DECISION MAKING: Stable/uncomplicated   EVALUATION COMPLEXITY: Low     GOALS: Goals reviewed with patient? No   SHORT TERM GOALS: Target date: 09/21/2022   Patient will be independent with initial home exercise program for self-management of symptoms. Baseline: Initial HEP provided at IE (09/07/22); Goal status: In-progress     LONG TERM GOALS: Target date: 11/30/2022   Patient will be independent with a long-term home exercise program for self-management of symptoms.  Baseline: Initial HEP provided at IE (09/07/22); Goal status: In-progress   2.  Patient will demonstrate improved FOTO to equal or greater than 67 by visit #12 to demonstrate improvement in overall condition and self-reported functional ability.  Baseline: 44 (09/07/22); Goal status: In-progress   3.  Patient will demonstrate L shoulder AROM equal or greater than 130 degrees flexion, 110 degrees abduction,  80 degrees ER at 90 degrees abduction, and IR to L1 to improve his ability to dress, reach overhead, and  throw balls.  Baseline: very limited  - see objective (09/07/22); Goal status: INITIAL   4.  Patient will demonstrate L shoulder strength equal or greater than 4/5 MMT in flexion, abduction, ER, and IR to improve his ability to reach overhead, throw a ball, and dress. Baseline: testing deferred due to acuity of injury (09/07/22); Goal status: In-progress   5.  Patient will complete community, work and/or recreational activities with equal or less than 75% limitation due to current condition.  Baseline: difficulty completing any activities that require use of left UE, showering, washing  dishes, dressing, hygiene, working out at [employer] for health, sleeping, housework, cooking, throwing balls, some things he does left handed (09/07/22); Goal status: In-progress   PLAN:   PT FREQUENCY: 1-2x/week   PT DURATION: 12 weeks   PLANNED INTERVENTIONS: Therapeutic exercises, Therapeutic activity, Neuromuscular re-education, Balance training, Gait training, Patient/Family education, Self Care, Joint mobilization, Dry Needling, Electrical stimulation, Spinal mobilization, Cryotherapy, Moist heat, Manual therapy, and Re-evaluation   PLAN FOR NEXT SESSION: Update HEP as appropriate, progressive PROM, AAROM, strengthening for posture, shoulder girdle, and UE. Manual therapy for pain control, improved ROM.    Nancy Nordmann, PT, DPT 09/24/2022, 11:56 AM  [facility] [address] [place], [place] [address] P: [phone] I F: [phone]

## 2022-09-28 ENCOUNTER — Encounter: Payer: Self-pay | Admitting: Physical Therapy

## 2022-09-28 ENCOUNTER — Ambulatory Visit: Payer: PPO | Attending: Specialist | Admitting: Physical Therapy

## 2022-09-28 DIAGNOSIS — M79622 Pain in left upper arm: Secondary | ICD-10-CM | POA: Diagnosis not present

## 2022-09-28 DIAGNOSIS — M25612 Stiffness of left shoulder, not elsewhere classified: Secondary | ICD-10-CM | POA: Insufficient documentation

## 2022-09-28 DIAGNOSIS — M25512 Pain in left shoulder: Secondary | ICD-10-CM | POA: Diagnosis not present

## 2022-09-28 DIAGNOSIS — M6281 Muscle weakness (generalized): Secondary | ICD-10-CM | POA: Diagnosis not present

## 2022-09-28 NOTE — Therapy (Signed)
OUTPATIENT PHYSICAL THERAPY TREATMENT NOTE   Patient Name: Glenn Watson MRN: [id number] DOB:[birth year], 77 y.o., male Today's Date: 09/28/2022  PCP: Idelle Crouch, MD REFERRING PROVIDER: Earnestine Leys, MD  END OF SESSION:   PT End of Session - 09/28/22 1032     Visit Number 7    Number of Visits 24    Date for PT Re-Evaluation 11/30/22    Authorization Type [employer] reporting period from 09/07/2022    Progress Note Due on Visit 10    PT Start Time 1030    PT Stop Time 1110    PT Time Calculation (min) 40 min    Activity Tolerance Patient tolerated treatment well;Patient limited by pain;Patient limited by fatigue    Behavior During Therapy [facility] for tasks assessed/performed                Past Medical History:  Diagnosis Date   Anemia    Asthma    as a child, exercise asthma - none since high school"   Cancer    skin cancer - basal, squamous- Mylenoma - stage 2- chest   Complication of anesthesia    "very sensitive to medications" "Had colonscopy with just Draminine"   Family history of adverse reaction to anesthesia    daughter- N/V   Hyperlipidemia    Hypertension    PONV (postoperative nausea and vomiting)    Past Surgical History:  Procedure Laterality Date   APPENDECTOMY  2011   Dr. Bary Castilla   COLONOSCOPY WITH PROPOFOL N/A 02/10/2017   Procedure: COLONOSCOPY WITH PROPOFOL;  Surgeon: Christene Lye, MD;  Location: [facility] END[facility];  Service: Endoscopy;  Laterality: N/A;   ORIF TIBIA PLATEAU Left 08/03/2019   Procedure: OPEN REDUCTION INTERNAL FIXATION (ORIF) TIBIAL PLATEAU;  Surgeon: Altamese Tahlequah, MD;  Location: [place];  Service: Orthopedics;  Laterality: Left;   Patient Active Problem List   Diagnosis Date Noted   Prostate cancer 03/04/2021   Hypertension    Hyperlipidemia    Asthma    Tibial plateau fracture, left, closed, with nonunion, subsequent encounter 08/03/2019   Closed fracture of shaft of tibia 03/28/2019    Retrocalcaneal bursitis, right 04/05/2018   Chronic anemia 11/14/2013   OA (osteoarthritis) 11/14/2013    REFERRING DIAG: 4-part fracture of surgical neck of left humerus  THERAPY DIAG:  Pain in left upper arm  Stiffness of left shoulder, not elsewhere classified  Left shoulder pain, unspecified chronicity  Muscle weakness (generalized)  Rationale for Evaluation and Treatment: Rehabilitation  PERTINENT HISTORY: Patient is a 77 y.o. male who presents to outpatient physical therapy with a referral for medical diagnosis 4-part fracture of surgical neck of left humerus. This patient's chief complaints consist of left upper arm and shoulder pain, shoulder stiffness, and L UE weakness/swelling, leading to the following functional deficits: difficulty completing any activities that require use of left UE, showering, washing dishes, dressing, hygiene, working out at [employer] for health, sleeping, housework, cooking, throwing balls, some things he does left handed. Relevant past medical history and comorbidities include prostate cancer (dx Fall 2021, 40 radiation treatments in summer 2022, now doing well followed 1x a year), HTN, hyperlipidemia, asthma (as a child), ORIF L tibial Plateau (08/03/2019), OA, appendectomy, skin cancer (removed).  Patient denies hx of stroke, seizures, lung problems, heart problems, diabetes, unexplained weight loss, unexplained changes in bowel or bladder problems, unexplained stumbling or dropping things, osteoporosis, and spinal surgery   PRECAUTIONS: fracture 07/24/2022  SUBJECTIVE:  SUBJECTIVE STATEMENT:  Patient reports he is feeling well and his left shoulder/arm is doing well. He has no pain at rest and soreness with moving and at night. He last did his HEP yesterday.    PAIN:  Are you  having pain? NPRS 0/10 left shoulder/upper arm.   OBJECTIVE:   AAROM L shoulder (last measured 09/28/22) Flexion: 125 Abduction: 135 (slight scaption plane) FIR: T10  TODAY'S TREATMENT:  Therapeutic exercise: to centralize symptoms and improve ROM, strength, muscular endurance, and activity tolerance required for successful completion of functional activities.  - seated AAROM L shoulder flexion with pulley, 1x10 with 5 second holds.  - seated AAROM L shoulder abduction with pulley, 1x10 with 5 second holds.  - seated  AAROM L shoulder FIR with pulley, 1x10 with 5 second hold.  - supine L shoulder AAROM flexion with PVC stick, 5 second holds. 1x10.  - sidelying L shoulder ER, 1x10 - sidelying AAROM L shoulder abduction using stick with 5 shoulder circumduction reps each direction in abduction. 1x10 plus additional reps to find correct challenge level and with some assistance from PT to learn exercise.  - sidelying L shoulder ER with towel roll under arm, 2x10  - supine B shoulder serratus punch holding PVC stick, 3x10, 5 second holds.   Pt required multimodal cuing for proper technique and to facilitate improved neuromuscular control, strength, range of motion, and functional ability resulting in improved performance and form.   PATIENT EDUCATION: Education details: Exercise purpose/form. Self management techniques.  Person educated: Patient Education method: Explanation, Demonstration, Tactile cues, Verbal cues, and Handouts Education comprehension: verbalized understanding, returned demonstration, and needs further education   HOME EXERCISE PROGRAM: Access Code: [email or web address] URL: [email or web address] Date: 09/28/2022 Prepared by: Rosita Kea  Exercises - Supine Shoulder External Rotation in 45 Degrees Abduction AAROM with Dowel  - 3 x daily - 1 sets - 10 reps - 10 seconds hold - Seated Shoulder Flexion AAROM with Pulley Behind  - 3 x daily - 1 sets - 10-20 reps - 5-10  seconds hold - Seated Shoulder Abduction AAROM with Pulley Behind  - 3 x daily - 1 sets - 10-20 reps - 5-10 seconds hold - Seated Shoulder Internal Rotation AAROM with Pulley (Mirrored)  - 2 x daily - 10 reps - 10 seconds hold - Standing Isometric Shoulder Internal Rotation at Doorway  - 2 x daily - 1 sets - 10 reps - 5 seconds hold - Standing Anatomical Position with Scapular Retraction and Depression at Wall  - 2 x daily - 1 sets - 10 reps - 5 second hold hold - Supine Shoulder Flexion with Dowel  - 2 x daily - 2 sets - 10 reps - 5 seconds hold - Supine Shoulder Protraction with Dowel  - 2 x daily - 2 sets - 10 reps - Sidelying Shoulder Abduction Palm Forward  - 1 x daily - 1 sets - 10 reps - 5 circles each way hold - Sidelying Shoulder External Rotation  - 1 x daily - 2 sets - 10 reps - 5 seconds hold    ASSESSMENT:   CLINICAL IMPRESSION: Patient arrives with report of good tolerance to last PT session and participation in HEP. Continued with interventions for improving ROM and strength to restore function of L UE. Patient continues to require intermittent cuing for improved form and effectiveness of exercises. Continues to be limited by pain, weakness, and motor control deficits but is improving steadily. Patient would benefit from continued management of limiting  condition by skilled physical therapist to address remaining impairments and functional limitations to work towards stated goals and return to PLOF or maximal functional independence.   From initial PT eval 09/07/2022: Patient is a 77 y.o. male referred to outpatient physical therapy with a medical diagnosis of 4-part fracture of surgical neck of left humerus who presents with signs and symptoms consistent with R shoulder pain, stiffness, and weakness, and L UE swelling 2/2 proximal humerus fracture on 07/24/2022 after [place]. Patient presents with significant swelling, pain, joint stiffness, guarding, motor control, muscle tension,  muscle performance (strength/power/endurance), swelling, tissue integrity, and activity tolerance impairments that are limiting ability to complete any activities that require use of left UE, showering, washing dishes, dressing, hygiene, working out at [employer] for health, sleeping, housework, cooking, throwing balls, some things he does left handed without difficulty. He currently has significant limitations due to pain with movement overhead with empty end feel with flexion and abduction. Patient will benefit from skilled physical therapy intervention to address current body structure impairments and activity limitations to improve function and work towards goals set in current POC in order to return to prior level of function or maximal functional improvement.    OBJECTIVE IMPAIRMENTS: decreased activity tolerance, decreased coordination, decreased endurance, decreased knowledge of condition, decreased mobility, decreased ROM, decreased strength, hypomobility, increased edema, increased fascial restrictions, impaired perceived functional ability, increased muscle spasms, impaired flexibility, impaired UE functional use, improper body mechanics, and pain.    ACTIVITY LIMITATIONS: carrying, lifting, sleeping, bathing, dressing, reach over head, hygiene/grooming, and caring for others   PARTICIPATION LIMITATIONS: meal prep, cleaning, interpersonal relationship, shopping, community activity, occupation, yard work, and   difficulty completing any activities that require use of left UE, showering, washing dishes, dressing, hygiene, working out at [employer] for health, sleeping, housework, cooking, throwing balls, some things he does left handed   PERSONAL FACTORS: Age, Past/current experiences, Time since onset of injury/illness/exacerbation, and 3+ comorbidities:   prostate cancer (dx Fall 2021, 40 radiation treatments in summer 2022, now doing well followed 1x a year), HTN, hyperlipidemia, asthma (as a child),  ORIF L tibial Plateau (08/03/2019), OA, appendectomy, skin cancer (removed) are also affecting patient's functional outcome.    REHAB POTENTIAL: Good   CLINICAL DECISION MAKING: Stable/uncomplicated   EVALUATION COMPLEXITY: Low     GOALS: Goals reviewed with patient? No   SHORT TERM GOALS: Target date: 09/21/2022   Patient will be independent with initial home exercise program for self-management of symptoms. Baseline: Initial HEP provided at IE (09/07/22); Goal status: In-progress     LONG TERM GOALS: Target date: 11/30/2022   Patient will be independent with a long-term home exercise program for self-management of symptoms.  Baseline: Initial HEP provided at IE (09/07/22); Goal status: In-progress   2.  Patient will demonstrate improved FOTO to equal or greater than 67 by visit #12 to demonstrate improvement in overall condition and self-reported functional ability.  Baseline: 44 (09/07/22); Goal status: In-progress   3.  Patient will demonstrate L shoulder AROM equal or greater than 130 degrees flexion, 110 degrees abduction,  80 degrees ER at 90 degrees abduction, and IR to L1 to improve his ability to dress, reach overhead, and throw balls.  Baseline: very limited  - see objective (09/07/22); Goal status: INITIAL   4.  Patient will demonstrate L shoulder strength equal or greater than 4/5 MMT in flexion, abduction, ER, and IR to improve his ability to reach overhead, throw a ball, and dress. Baseline:  testing deferred due to acuity of injury (09/07/22); Goal status: In-progress   5.  Patient will complete community, work and/or recreational activities with equal or less than 75% limitation due to current condition.  Baseline: difficulty completing any activities that require use of left UE, showering, washing dishes, dressing, hygiene, working out at [employer] for health, sleeping, housework, cooking, throwing balls, some things he does left handed (09/07/22); Goal status:  In-progress   PLAN:   PT FREQUENCY: 1-2x/week   PT DURATION: 12 weeks   PLANNED INTERVENTIONS: Therapeutic exercises, Therapeutic activity, Neuromuscular re-education, Balance training, Gait training, Patient/Family education, Self Care, Joint mobilization, Dry Needling, Electrical stimulation, Spinal mobilization, Cryotherapy, Moist heat, Manual therapy, and Re-evaluation   PLAN FOR NEXT SESSION: Update HEP as appropriate, progressive PROM, AAROM, strengthening for posture, shoulder girdle, and UE. Manual therapy for pain control, improved ROM.    Nancy Nordmann, PT, DPT 09/28/2022, 11:18 AM  [facility] [address] [place], [place] [address] P: [phone] I F: [phone]

## 2022-10-01 ENCOUNTER — Ambulatory Visit: Payer: PPO | Admitting: Physical Therapy

## 2022-10-01 ENCOUNTER — Encounter: Payer: Self-pay | Admitting: Physical Therapy

## 2022-10-01 DIAGNOSIS — M6281 Muscle weakness (generalized): Secondary | ICD-10-CM

## 2022-10-01 DIAGNOSIS — M79622 Pain in left upper arm: Secondary | ICD-10-CM

## 2022-10-01 DIAGNOSIS — M25612 Stiffness of left shoulder, not elsewhere classified: Secondary | ICD-10-CM

## 2022-10-01 DIAGNOSIS — M25512 Pain in left shoulder: Secondary | ICD-10-CM

## 2022-10-01 NOTE — Therapy (Signed)
OUTPATIENT PHYSICAL THERAPY TREATMENT NOTE   Patient Name: Glenn Watson MRN: [id number] DOB:29-Mar-1946, 77 y.o., male Today's Date: 10/01/2022  PCP: Idelle Crouch, MD REFERRING PROVIDER: Earnestine Leys, MD  END OF SESSION:   PT End of Session - 10/01/22 1033     Visit Number 8    Number of Visits 24    Date for PT Re-Evaluation 11/30/22    Authorization Type [employer] reporting period from 09/07/2022    Progress Note Due on Visit 10    PT Start Time 1030    PT Stop Time 1110    PT Time Calculation (min) 40 min    Activity Tolerance Patient tolerated treatment well;Patient limited by pain;Patient limited by fatigue    Behavior During Therapy [facility] for tasks assessed/performed                 Past Medical History:  Diagnosis Date   Anemia    Asthma    as a child, exercise asthma - none since high school"   Cancer    skin cancer - basal, squamous- Mylenoma - stage 2- chest   Complication of anesthesia    "very sensitive to medications" "Had colonscopy with just Draminine"   Family history of adverse reaction to anesthesia    daughter- N/V   Hyperlipidemia    Hypertension    PONV (postoperative nausea and vomiting)    Past Surgical History:  Procedure Laterality Date   APPENDECTOMY  2011   Dr. Bary Castilla   COLONOSCOPY WITH PROPOFOL N/A 02/10/2017   Procedure: COLONOSCOPY WITH PROPOFOL;  Surgeon: Christene Lye, MD;  Location: [facility];  Service: Endoscopy;  Laterality: N/A;   ORIF TIBIA PLATEAU Left 08/03/2019   Procedure: OPEN REDUCTION INTERNAL FIXATION (ORIF) TIBIAL PLATEAU;  Surgeon: Altamese Lacassine, MD;  Location: [place];  Service: Orthopedics;  Laterality: Left;   Patient Active Problem List   Diagnosis Date Noted   Prostate cancer 03/04/2021   Hypertension    Hyperlipidemia    Asthma    Tibial plateau fracture, left, closed, with nonunion, subsequent encounter 08/03/2019   Closed fracture of shaft of tibia 03/28/2019    Retrocalcaneal bursitis, right 04/05/2018   Chronic anemia 11/14/2013   OA (osteoarthritis) 11/14/2013    REFERRING DIAG: 4-part fracture of surgical neck of left humerus  THERAPY DIAG:  Pain in left upper arm  Stiffness of left shoulder, not elsewhere classified  Left shoulder pain, unspecified chronicity  Muscle weakness (generalized)  Rationale for Evaluation and Treatment: Rehabilitation  PERTINENT HISTORY: Patient is a 77 y.o. male who presents to outpatient physical therapy with a referral for medical diagnosis 4-part fracture of surgical neck of left humerus. This patient's chief complaints consist of left upper arm and shoulder pain, shoulder stiffness, and L UE weakness/swelling, leading to the following functional deficits: difficulty completing any activities that require use of left UE, showering, washing dishes, dressing, hygiene, working out at [employer] for health, sleeping, housework, cooking, throwing balls, some things he does left handed. Relevant past medical history and comorbidities include prostate cancer (dx Fall 2021, 40 radiation treatments in summer 2022, now doing well followed 1x a year), HTN, hyperlipidemia, asthma (as a child), ORIF L tibial Plateau (08/03/2019), OA, appendectomy, skin cancer (removed).  Patient denies hx of stroke, seizures, lung problems, heart problems, diabetes, unexplained weight loss, unexplained changes in bowel or bladder problems, unexplained stumbling or dropping things, osteoporosis, and spinal surgery   PRECAUTIONS: fracture 07/24/2022  SUBJECTIVE:  SUBJECTIVE STATEMENT:  Patient reports he feels like his left arm is doing very well. He was able to the [facility] AAROM exercise 5 reps at home. He is having trouble holding his arm in sidelying shoulder abduction.      PAIN:  Are you having pain? NPRS 0/10 left shoulder/upper arm when stationary.   OBJECTIVE  SELF-REPORTED FUNCTION FOTO score: 61/100 (upper arm questionnaire)  AAROM L shoulder (last measured 10/01/22) Flexion: 125 Abduction: 130 (slight scaption plane) FIR: T9  TODAY'S TREATMENT:  Therapeutic exercise: to centralize symptoms and improve ROM, strength, muscular endurance, and activity tolerance required for successful completion of functional activities.  - seated AAROM L shoulder flexion with pulley, 1x10 with 5 second holds.  - seated AAROM L shoulder abduction with pulley, 1x10 with 5 second holds.  - seated  AAROM L shoulder FIR with pulley, 1x10 with 5 second hold.  - supine L shoulder AAROM flexion with PVC stick loaded with 3#AW, 5 second holds. 1x10.  - supine L shoulder AAROM flexion with PVC stick with surface inclined 30 degrees. 1x10.  - sidelying L shoulder ER AROM with towel roll under arm, 1x10 - sidelying AAROM L shoulder abduction using stick with 5 shoulder circumduction reps each direction in abduction. 1x10.   Pt required multimodal cuing for proper technique and to facilitate improved neuromuscular control, strength, range of motion, and functional ability resulting in improved performance and form.   PATIENT EDUCATION: Education details: Exercise purpose/form. Self management techniques.  Reviewed cancelation/no-show policy with patient and confirmed patient has correct phone number for clinic; patient verbalized understanding (10/01/22). Person educated: Patient Education method: Explanation, Demonstration, Tactile cues, Verbal cues, and Handouts Education comprehension: verbalized understanding, returned demonstration, and needs further education   HOME EXERCISE PROGRAM: Access Code: [email or web address] URL: [email or web address] Date: 09/28/2022 Prepared by: Rosita Kea  Exercises - Supine Shoulder External Rotation in 45 Degrees Abduction AAROM  with Dowel  - 3 x daily - 1 sets - 10 reps - 10 seconds hold - Seated Shoulder Flexion AAROM with Pulley Behind  - 3 x daily - 1 sets - 10-20 reps - 5-10 seconds hold - Seated Shoulder Abduction AAROM with Pulley Behind  - 3 x daily - 1 sets - 10-20 reps - 5-10 seconds hold - Seated Shoulder Internal Rotation AAROM with Pulley (Mirrored)  - 2 x daily - 10 reps - 10 seconds hold - Standing Isometric Shoulder Internal Rotation at Doorway  - 2 x daily - 1 sets - 10 reps - 5 seconds hold - Standing Anatomical Position with Scapular Retraction and Depression at Wall  - 2 x daily - 1 sets - 10 reps - 5 second hold hold - Supine Shoulder Flexion with Dowel  - 2 x daily - 2 sets - 10 reps - 5 seconds hold - Supine Shoulder Protraction with Dowel  - 2 x daily - 2 sets - 10 reps - Sidelying Shoulder Abduction Palm Forward  - 1 x daily - 1 sets - 10 reps - 5 circles each way hold - Sidelying Shoulder External Rotation  - 1 x daily - 2 sets - 10 reps - 5 seconds hold    ASSESSMENT:   CLINICAL IMPRESSION: Patient arrives reporting improved ability to do FIR AAROM since last PT session and continued good participation in HEP. Today's session continued to focus on progressive AAROM, strengthening, and ROM exercises to improve his L shoulder/UE function and progress towards goals. Patient continues to be limited by pain, weakness, and motor  control deficits but continues to make gradual progress. Patient would benefit from continued management of limiting condition by skilled physical therapist to address remaining impairments and functional limitations to work towards stated goals and return to PLOF or maximal functional independence.  From initial PT eval 09/07/2022: Patient is a 77 y.o. male referred to outpatient physical therapy with a medical diagnosis of 4-part fracture of surgical neck of left humerus who presents with signs and symptoms consistent with R shoulder pain, stiffness, and weakness, and L UE  swelling 2/2 proximal humerus fracture on 07/24/2022 after [place]. Patient presents with significant swelling, pain, joint stiffness, guarding, motor control, muscle tension, muscle performance (strength/power/endurance), swelling, tissue integrity, and activity tolerance impairments that are limiting ability to complete any activities that require use of left UE, showering, washing dishes, dressing, hygiene, working out at [employer] for health, sleeping, housework, cooking, throwing balls, some things he does left handed without difficulty. He currently has significant limitations due to pain with movement overhead with empty end feel with flexion and abduction. Patient will benefit from skilled physical therapy intervention to address current body structure impairments and activity limitations to improve function and work towards goals set in current POC in order to return to prior level of function or maximal functional improvement.    OBJECTIVE IMPAIRMENTS: decreased activity tolerance, decreased coordination, decreased endurance, decreased knowledge of condition, decreased mobility, decreased ROM, decreased strength, hypomobility, increased edema, increased fascial restrictions, impaired perceived functional ability, increased muscle spasms, impaired flexibility, impaired UE functional use, improper body mechanics, and pain.    ACTIVITY LIMITATIONS: carrying, lifting, sleeping, bathing, dressing, reach over head, hygiene/grooming, and caring for others   PARTICIPATION LIMITATIONS: meal prep, cleaning, interpersonal relationship, shopping, community activity, occupation, yard work, and   difficulty completing any activities that require use of left UE, showering, washing dishes, dressing, hygiene, working out at [employer] for health, sleeping, housework, cooking, throwing balls, some things he does left handed   PERSONAL FACTORS: Age, Past/current experiences, Time since onset of injury/illness/exacerbation,  and 3+ comorbidities:   prostate cancer (dx Fall 2021, 40 radiation treatments in summer 2022, now doing well followed 1x a year), HTN, hyperlipidemia, asthma (as a child), ORIF L tibial Plateau (08/03/2019), OA, appendectomy, skin cancer (removed) are also affecting patient's functional outcome.    REHAB POTENTIAL: Good   CLINICAL DECISION MAKING: Stable/uncomplicated   EVALUATION COMPLEXITY: Low     GOALS: Goals reviewed with patient? No   SHORT TERM GOALS: Target date: 09/21/2022   Patient will be independent with initial home exercise program for self-management of symptoms. Baseline: Initial HEP provided at IE (09/07/22); Goal status: In-progress     LONG TERM GOALS: Target date: 11/30/2022   Patient will be independent with a long-term home exercise program for self-management of symptoms.  Baseline: Initial HEP provided at IE (09/07/22); Goal status: In-progress   2.  Patient will demonstrate improved FOTO to equal or greater than 67 by visit #12 to demonstrate improvement in overall condition and self-reported functional ability.  Baseline: 44 (09/07/22); Goal status: In-progress   3.  Patient will demonstrate L shoulder AROM equal or greater than 130 degrees flexion, 110 degrees abduction,  80 degrees ER at 90 degrees abduction, and IR to L1 to improve his ability to dress, reach overhead, and throw balls.  Baseline: very limited  - see objective (09/07/22); Goal status: INITIAL   4.  Patient will demonstrate L shoulder strength equal or greater than 4/5 MMT in flexion, abduction, ER,  and IR to improve his ability to reach overhead, throw a ball, and dress. Baseline: testing deferred due to acuity of injury (09/07/22); Goal status: In-progress   5.  Patient will complete community, work and/or recreational activities with equal or less than 75% limitation due to current condition.  Baseline: difficulty completing any activities that require use of left UE, showering, washing  dishes, dressing, hygiene, working out at [employer] for health, sleeping, housework, cooking, throwing balls, some things he does left handed (09/07/22); Goal status: In-progress   PLAN:   PT FREQUENCY: 1-2x/week   PT DURATION: 12 weeks   PLANNED INTERVENTIONS: Therapeutic exercises, Therapeutic activity, Neuromuscular re-education, Balance training, Gait training, Patient/Family education, Self Care, Joint mobilization, Dry Needling, Electrical stimulation, Spinal mobilization, Cryotherapy, Moist heat, Manual therapy, and Re-evaluation   PLAN FOR NEXT SESSION: Update HEP as appropriate, progressive PROM, AAROM, strengthening for posture, shoulder girdle, and UE. Manual therapy for pain control, improved ROM.    Nancy Nordmann, PT, DPT 10/01/2022, 11:26 AM  [facility] [address] [place], [place] [address] P: [phone] I F: [phone]

## 2022-10-06 ENCOUNTER — Ambulatory Visit: Payer: PPO | Admitting: Physical Therapy

## 2022-10-06 ENCOUNTER — Encounter: Payer: Self-pay | Admitting: Physical Therapy

## 2022-10-06 DIAGNOSIS — M79622 Pain in left upper arm: Secondary | ICD-10-CM

## 2022-10-06 DIAGNOSIS — M25512 Pain in left shoulder: Secondary | ICD-10-CM

## 2022-10-06 DIAGNOSIS — M25612 Stiffness of left shoulder, not elsewhere classified: Secondary | ICD-10-CM

## 2022-10-06 DIAGNOSIS — M6281 Muscle weakness (generalized): Secondary | ICD-10-CM

## 2022-10-06 NOTE — Therapy (Signed)
OUTPATIENT PHYSICAL THERAPY TREATMENT NOTE   Patient Name: Glenn Watson MRN: [id number] DOB:02-07-1946, 77 y.o., male Today's Date: 10/06/2022  PCP: Marguarite Arbour, MD REFERRING PROVIDER: Deeann Saint, MD  END OF SESSION:   PT End of Session - 10/06/22 1143     Visit Number 9    Number of Visits 24    Date for PT Re-Evaluation 11/30/22    Authorization Type [employer] reporting period from 09/07/2022    Progress Note Due on Visit 10    PT Start Time 1116    PT Stop Time 1205    PT Time Calculation (min) 49 min    Activity Tolerance Patient tolerated treatment well;Patient limited by pain;Patient limited by fatigue    Behavior During Therapy [facility] for tasks assessed/performed                  Past Medical History:  Diagnosis Date   Anemia    Asthma    as a child, exercise asthma - none since high school"   Cancer    skin cancer - basal, squamous- Mylenoma - stage 2- chest   Complication of anesthesia    "very sensitive to medications" "Had colonscopy with just Draminine"   Family history of adverse reaction to anesthesia    daughter- N/V   Hyperlipidemia    Hypertension    PONV (postoperative nausea and vomiting)    Past Surgical History:  Procedure Laterality Date   APPENDECTOMY  2011   Dr. Lemar Livings   COLONOSCOPY WITH PROPOFOL N/A 02/10/2017   Procedure: COLONOSCOPY WITH PROPOFOL;  Surgeon: Kieth Brightly, MD;  Location: [facility];  Service: Endoscopy;  Laterality: N/A;   ORIF TIBIA PLATEAU Left 08/03/2019   Procedure: OPEN REDUCTION INTERNAL FIXATION (ORIF) TIBIAL PLATEAU;  Surgeon: Myrene Galas, MD;  Location: [facility] OR;  Service: Orthopedics;  Laterality: Left;   Patient Active Problem List   Diagnosis Date Noted   Prostate cancer 03/04/2021   Hypertension    Hyperlipidemia    Asthma    Tibial plateau fracture, left, closed, with nonunion, subsequent encounter 08/03/2019   Closed fracture of shaft of tibia 03/28/2019    Retrocalcaneal bursitis, right 04/05/2018   Chronic anemia 11/14/2013   OA (osteoarthritis) 11/14/2013    REFERRING DIAG: 4-part fracture of surgical neck of left humerus  THERAPY DIAG:  Pain in left upper arm  Stiffness of left shoulder, not elsewhere classified  Left shoulder pain, unspecified chronicity  Muscle weakness (generalized)  Rationale for Evaluation and Treatment: Rehabilitation  PERTINENT HISTORY: Patient is a 77 y.o. male who presents to outpatient physical therapy with a referral for medical diagnosis 4-part fracture of surgical neck of left humerus. This patient's chief complaints consist of left upper arm and shoulder pain, shoulder stiffness, and L UE weakness/swelling, leading to the following functional deficits: difficulty completing any activities that require use of left UE, showering, washing dishes, dressing, hygiene, working out at [employer] for health, sleeping, housework, cooking, throwing balls, some things he does left handed. Relevant past medical history and comorbidities include prostate cancer (dx Fall 2021, 40 radiation treatments in summer 2022, now doing well followed 1x a year), HTN, hyperlipidemia, asthma (as a child), ORIF L tibial Plateau (08/03/2019), OA, appendectomy, skin cancer (removed).  Patient denies hx of stroke, seizures, lung problems, heart problems, diabetes, unexplained weight loss, unexplained changes in bowel or bladder problems, unexplained stumbling or dropping things, osteoporosis, and spinal surgery   PRECAUTIONS: fracture 07/24/2022  SUBJECTIVE:  SUBJECTIVE STATEMENT:  Patient reports he is feeling well today. He states he did his HEP on Sunday the best he has ever done. He continues to have soreness in the left shoulder but not any excruciating pain.      PAIN:  Are you having pain? NPRS 0/10 left shoulder/upper arm when stationary.   OBJECTIVE  AAROM L shoulder (last measured 10/06/22) Flexion: 120 Abduction: 135 (slight scaption plane) FIR: T9  TODAY'S TREATMENT:  Therapeutic exercise: to centralize symptoms and improve ROM, strength, muscular endurance, and activity tolerance required for successful completion of functional activities.  - seated AAROM L shoulder flexion with pulley, 1x10 with 5 second holds.  - seated AAROM L shoulder abduction with pulley, 1x10 with 5 second holds.  - seated  AAROM L shoulder FIR with pulley, 1x10 with 5 second hold.  - standing AROM flexion with back to wall, 1x3. AAROM with PVC stick, 1x5. L shoulder hike present but getting to approx 80 degrees flexion.  - attempted wall slide into L shoulder flexion AAROM, but needs heavy assistance from R UE.   - supine L shoulder AAROM flexion with PVC stick loaded with 5#AW, 5 second holds. 1x10.  - supine L shoulder AAROM flexion with PVC stick with surface inclined 30 degrees. 2x10. (Attempted with no stick and too difficult).  - sidelying L shoulder ER AROM with towel roll under arm, 1x10 with 2#DB - sidelying L shoulder AROM abduction with elbow flexed, 1x3 (difficult and painful).  - sidelying AAROM L shoulder abduction using stick with 5 shoulder circumduction reps each direction in abduction. 1x5.   Pt required multimodal cuing for proper technique and to facilitate improved neuromuscular control, strength, range of motion, and functional ability resulting in improved performance and form.   PATIENT EDUCATION: Education details: Exercise purpose/form. Self management techniques.  Reviewed cancelation/no-show policy with patient and confirmed patient has correct phone number for clinic; patient verbalized understanding (10/01/22). Person educated: Patient Education method: Explanation, Demonstration, Tactile cues, Verbal cues, and Handouts Education  comprehension: verbalized understanding, returned demonstration, and needs further education   HOME EXERCISE PROGRAM: Access Code: [email or web address] URL: [email or web address] Date: 10/06/2022 Prepared by: Norton BlizzardSara Bertin Inabinet  Exercises - Supine Shoulder External Rotation in 45 Degrees Abduction AAROM with Dowel  - 3 x daily - 1 sets - 10 reps - 10 seconds hold - Seated Shoulder Flexion AAROM with Pulley Behind  - 3 x daily - 1 sets - 10-20 reps - 5-10 seconds hold - Seated Shoulder Abduction AAROM with Pulley Behind  - 3 x daily - 1 sets - 10-20 reps - 5-10 seconds hold - Seated Shoulder Internal Rotation AAROM with Pulley (Mirrored)  - 2 x daily - 10 reps - 10 seconds hold - Standing Isometric Shoulder Internal Rotation at Doorway  - 2 x daily - 1 sets - 10 reps - 5 seconds hold - Standing Anatomical Position with Scapular Retraction and Depression at Wall  - 2 x daily - 1 sets - 10 reps - 5 second hold hold - Supine Shoulder Flexion with Dowel  - 2 x daily - 2 sets - 10 reps - 5 seconds hold - Supine Shoulder Protraction with Dowel  - 2 x daily - 2 sets - 10 reps - Sidelying Shoulder Abduction Palm Forward  - 1 x daily - 1 sets - 10 reps - 5 circles each way hold - Sidelying Shoulder ER with Towel and Dumbbell  - 1 x daily - 2 sets - 10 reps - 5  seconds hold    ASSESSMENT:   CLINICAL IMPRESSION: Patient arrives reporting improved improved ability to perform HEP and improved L shoulder function. Today's session continued to work on improving ROM and strength to help restore function for activities including reaching, pushing, and pulling. Patient continues to demonstrate gradual progress towards goals and continues to need PT assistance for exercise selection, cuing for form, and reassurance in progression. Plan to continue with progressive strengthening and ROM exercises. Patient would benefit from continued management of limiting condition by skilled physical therapist to address remaining  impairments and functional limitations to work towards stated goals and return to PLOF or maximal functional independence.   From initial PT eval 09/07/2022: Patient is a 77 y.o. male referred to outpatient physical therapy with a medical diagnosis of 4-part fracture of surgical neck of left humerus who presents with signs and symptoms consistent with R shoulder pain, stiffness, and weakness, and L UE swelling 2/2 proximal humerus fracture on 07/24/2022 after FOOSH. Patient presents with significant swelling, pain, joint stiffness, guarding, motor control, muscle tension, muscle performance (strength/power/endurance), swelling, tissue integrity, and activity tolerance impairments that are limiting ability to complete any activities that require use of left UE, showering, washing dishes, dressing, hygiene, working out at [employer] for health, sleeping, housework, cooking, throwing balls, some things he does left handed without difficulty. He currently has significant limitations due to pain with movement overhead with empty end feel with flexion and abduction. Patient will benefit from skilled physical therapy intervention to address current body structure impairments and activity limitations to improve function and work towards goals set in current POC in order to return to prior level of function or maximal functional improvement.    OBJECTIVE IMPAIRMENTS: decreased activity tolerance, decreased coordination, decreased endurance, decreased knowledge of condition, decreased mobility, decreased ROM, decreased strength, hypomobility, increased edema, increased fascial restrictions, impaired perceived functional ability, increased muscle spasms, impaired flexibility, impaired UE functional use, improper body mechanics, and pain.    ACTIVITY LIMITATIONS: carrying, lifting, sleeping, bathing, dressing, reach over head, hygiene/grooming, and caring for others   PARTICIPATION LIMITATIONS: meal prep, cleaning,  interpersonal relationship, shopping, community activity, occupation, yard work, and   difficulty completing any activities that require use of left UE, showering, washing dishes, dressing, hygiene, working out at [employer] for health, sleeping, housework, cooking, throwing balls, some things he does left handed   PERSONAL FACTORS: Age, Past/current experiences, Time since onset of injury/illness/exacerbation, and 3+ comorbidities:   prostate cancer (dx Fall 2021, 40 radiation treatments in summer 2022, now doing well followed 1x a year), HTN, hyperlipidemia, asthma (as a child), ORIF L tibial Plateau (08/03/2019), OA, appendectomy, skin cancer (removed) are also affecting patient's functional outcome.    REHAB POTENTIAL: Good   CLINICAL DECISION MAKING: Stable/uncomplicated   EVALUATION COMPLEXITY: Low     GOALS: Goals reviewed with patient? No   SHORT TERM GOALS: Target date: 09/21/2022   Patient will be independent with initial home exercise program for self-management of symptoms. Baseline: Initial HEP provided at IE (09/07/22); Goal status: In-progress     LONG TERM GOALS: Target date: 11/30/2022   Patient will be independent with a long-term home exercise program for self-management of symptoms.  Baseline: Initial HEP provided at [facility] (09/07/22); Goal status: In-progress   2.  Patient will demonstrate improved FOTO to equal or greater than 67 by visit #12 to demonstrate improvement in overall condition and self-reported functional ability.  Baseline: 44 (09/07/22); Goal status: In-progress   3.  Patient will demonstrate L shoulder AROM equal or greater than 130 degrees flexion, 110 degrees abduction,  80 degrees ER at 90 degrees abduction, and IR to L1 to improve his ability to dress, reach overhead, and throw balls.  Baseline: very limited  - see objective (09/07/22); Goal status: INITIAL   4.  Patient will demonstrate L shoulder strength equal or greater than 4/5 MMT in flexion,  abduction, ER, and IR to improve his ability to reach overhead, throw a ball, and dress. Baseline: testing deferred due to acuity of injury (09/07/22); Goal status: In-progress   5.  Patient will complete community, work and/or recreational activities with equal or less than 75% limitation due to current condition.  Baseline: difficulty completing any activities that require use of left UE, showering, washing dishes, dressing, hygiene, working out at [employer] for health, sleeping, housework, cooking, throwing balls, some things he does left handed (09/07/22); Goal status: In-progress   PLAN:   PT FREQUENCY: 1-2x/week   PT DURATION: 12 weeks   PLANNED INTERVENTIONS: Therapeutic exercises, Therapeutic activity, Neuromuscular re-education, Balance training, Gait training, Patient/Family education, Self Care, Joint mobilization, Dry Needling, Electrical stimulation, Spinal mobilization, Cryotherapy, Moist heat, Manual therapy, and Re-evaluation   PLAN FOR NEXT SESSION: Update HEP as appropriate, progressive PROM, AAROM, strengthening for posture, shoulder girdle, and UE. Manual therapy for pain control, improved ROM.    Cira Rue, PT, DPT 10/06/2022, 12:08 PM  [facility] [address] [place], [place] [address] P: [phone] I F: [phone]

## 2022-10-08 ENCOUNTER — Encounter: Payer: Self-pay | Admitting: Physical Therapy

## 2022-10-08 ENCOUNTER — Ambulatory Visit: Payer: PPO | Admitting: Physical Therapy

## 2022-10-08 DIAGNOSIS — M6281 Muscle weakness (generalized): Secondary | ICD-10-CM

## 2022-10-08 DIAGNOSIS — M79622 Pain in left upper arm: Secondary | ICD-10-CM

## 2022-10-08 DIAGNOSIS — M25512 Pain in left shoulder: Secondary | ICD-10-CM

## 2022-10-08 DIAGNOSIS — M25612 Stiffness of left shoulder, not elsewhere classified: Secondary | ICD-10-CM

## 2022-10-08 NOTE — Therapy (Addendum)
OUTPATIENT PHYSICAL THERAPY TREATMENT NOTE / PROGRESS NOTE Dates of reporting from 09/07/2022 to 10/08/2022   Patient Name: Glenn Watson MRN: [id number] DOB:[birth year], 77 y.o., male Today's Date: 10/08/2022  PCP: Marguarite Arbour, MD REFERRING PROVIDER: Deeann Saint, MD  END OF SESSION:   PT End of Session - 10/08/22 1102     Visit Number 10    Number of Visits 24    Date for PT Re-Evaluation 11/30/22    Authorization Type [employer] reporting period from 09/07/2022    Progress Note Due on Visit 10    PT Start Time 1102    PT Stop Time 1153    PT Time Calculation (min) 51 min    Activity Tolerance Patient tolerated treatment well;Patient limited by pain;Patient limited by fatigue    Behavior During Therapy [facility] for tasks assessed/performed               Past Medical History:  Diagnosis Date   Anemia    Asthma    as a child, exercise asthma - none since high school"   Cancer    skin cancer - basal, squamous- Mylenoma - stage 2- chest   Complication of anesthesia    "very sensitive to medications" "Had colonscopy with just Draminine"   Family history of adverse reaction to anesthesia    daughter- N/V   Hyperlipidemia    Hypertension    PONV (postoperative nausea and vomiting)    Past Surgical History:  Procedure Laterality Date   APPENDECTOMY  2011   Dr. Lemar Livings   COLONOSCOPY WITH PROPOFOL N/A 02/10/2017   Procedure: COLONOSCOPY WITH PROPOFOL;  Surgeon: Kieth Brightly, MD;  Location: [facility];  Service: Endoscopy;  Laterality: N/A;   ORIF TIBIA PLATEAU Left 08/03/2019   Procedure: OPEN REDUCTION INTERNAL FIXATION (ORIF) TIBIAL PLATEAU;  Surgeon: Myrene Galas, MD;  Location: [facility] OR;  Service: Orthopedics;  Laterality: Left;   Patient Active Problem List   Diagnosis Date Noted   Prostate cancer 03/04/2021   Hypertension    Hyperlipidemia    Asthma    Tibial plateau fracture, left, closed, with nonunion, subsequent encounter  08/03/2019   Closed fracture of shaft of tibia 03/28/2019   Retrocalcaneal bursitis, right 04/05/2018   Chronic anemia 11/14/2013   OA (osteoarthritis) 11/14/2013    REFERRING DIAG: 4-part fracture of surgical neck of left humerus  THERAPY DIAG:  Pain in left upper arm  Stiffness of left shoulder, not elsewhere classified  Left shoulder pain, unspecified chronicity  Muscle weakness (generalized)  Rationale for Evaluation and Treatment: Rehabilitation  PERTINENT HISTORY: Patient is a 77 y.o. male who presents to outpatient physical therapy with a referral for medical diagnosis 4-part fracture of surgical neck of left humerus. This patient's chief complaints consist of left upper arm and shoulder pain, shoulder stiffness, and L UE weakness/swelling 2/2 L humeral neck fracture on 07/24/2022 leading to the following functional deficits: difficulty completing any activities that require use of left UE, showering, washing dishes, dressing, hygiene, working out at [employer] for health, sleeping, housework, cooking, throwing balls, some things he does left handed. Relevant past medical history and comorbidities include prostate cancer (dx Fall 2021, 40 radiation treatments in summer 2022, now doing well followed 1x a year), HTN, hyperlipidemia, asthma (as a child), ORIF L tibial Plateau (08/03/2019), OA, appendectomy, skin cancer (removed).  Patient denies hx of stroke, seizures, lung problems, heart problems, diabetes, unexplained weight loss, unexplained changes in bowel or bladder problems, unexplained stumbling or dropping  things, osteoporosis, and spinal surgery   PRECAUTIONS: fracture 07/24/2022  SUBJECTIVE:                                                                                                                                                                                      SUBJECTIVE STATEMENT:  Patient reports he is feeling well and his left shoulder continues to do well. He has usual  soreness but not pain. He was able to complete AROM to left abduction with circles while in sidelying without assistance from the stick. He continues to work at using his left UE for daily activities as able and participate in HEP regularly.    PAIN:  Are you having pain? NPRS 0/10 left shoulder/upper arm when stationary.   OBJECTIVE  SELF-REPORTED FUNCTION FOTO score: 61/100 (upper arm questionnaire, last measured 10/01/2022)  PERIPHERAL JOINT MOTION (in degrees)  AAROM L shoulder (last measured 10/08/22) Flexion: 123 Abduction: 125 (slight scaption plane) FIR: T9   ACTIVE RANGE OF MOTION (AROM) *Indicates pain 09/07/22 10/08/22 Date  Joint/Motion R/L R/L R/L  Shoulder        Flexion 142/50* /70 /  Extension 72/40* /48* /  Abduction  155/55* /65* /  External rotation 60/0 /0 /  Internal rotation T8/Sacrum* /T12 /  Elbow        Flexion  134/130 /134 /  Extension  -6/-8 /-4 /  Comments:  09/07/2022: pulling/discomfort pain at left painful movements. Abduction most painful. Compensatory L shoulder hike with ABD>FLEX.  10/08/2022: abduction most painful   PASSIVE RANGE OF MOTION (PROM) *Indicates pain 09/07/22 10/08/22 Date  Joint/Motion R/L R/L R/L  Shoulder        Flexion /108* /130* /  Extension / / /  Abduction  /68* /110* /  External rotation /28* /18* /  Internal rotation /60* /82* /  Comments:  09/07/2022: flexion and abduction limited by pain/empty end feel with mild guarding. L shoulder ER/IR at 45 degrees scaption.  10/08/2022: L shoulder ER/IR at 90 degrees abduction   MUSCLE PERFORMANCE (MMT):  Comments: MMT deferred due to time since fracture.    Grip strength (in pounds, average of three measures).  09/07/2022:  R: (59+59+65)/3 = 61 L: (34+29+30)/ 3 = 31 10/08/2022:  R: (65+73+65)/3 = 67.7 L: (50+46+46)/ 3 = 47.3   TODAY'S TREATMENT:  Therapeutic exercise: to centralize symptoms and improve ROM, strength, muscular endurance, and activity tolerance required  for successful completion of functional activities.  - seated AAROM L shoulder flexion with pulley, 1x10 with 5 second holds.  - seated AAROM L shoulder abduction with pulley, 1x10 with 5 second holds.  - grip strength testing (see above) -  seated  AAROM L shoulder FIR with pulley, 1x10 with 5 second hold.  - AROM testing (see above).  - PROM testing (see above).  - supine L shoulder AAROM flexion with PVC stick loaded with 5#AW, 5 second holds. 1x10.  - supine L shoulder AAROM flexion with PVC stick with surface inclined 30 degrees. 3x10.  - seated L shoulder AAROM ER stretch bending forwards at the waist with L UE on table, 1x10 with 5 second holds (difficult).   Pt required multimodal cuing for proper technique and to facilitate improved neuromuscular control, strength, range of motion, and functional ability resulting in improved performance and form.   PATIENT EDUCATION: Education details: Exercise purpose/form. Self management techniques.  Reviewed cancelation/no-show policy with patient and confirmed patient has correct phone number for clinic; patient verbalized understanding (10/01/22). Person educated: Patient Education method: Explanation, Demonstration, Tactile cues, Verbal cues, and Handouts Education comprehension: verbalized understanding, returned demonstration, and needs further education   HOME EXERCISE PROGRAM: Access Code: [email or web address] URL: [email or web address] Date: 10/08/2022 Prepared by: Norton Blizzard  Exercises - Seated Shoulder Flexion AAROM with Pulley Behind  - 3 x daily - 1 sets - 10-20 reps - 5-10 seconds hold - Seated Shoulder Abduction AAROM with Pulley Behind  - 3 x daily - 1 sets - 10-20 reps - 5-10 seconds hold - Seated Shoulder Internal Rotation AAROM with Pulley (Mirrored)  - 2 x daily - 10 reps - 10 seconds hold - Standing Isometric Shoulder Internal Rotation at Doorway  - 2 x daily - 1 sets - 10 reps - 5 seconds hold - Standing Anatomical  Position with Scapular Retraction and Depression at Wall  - 2 x daily - 1 sets - 10 reps - 5 second hold hold - Supine Shoulder Flexion with Dowel  - 2 x daily - 2 sets - 10 reps - 5 seconds hold - Supine Shoulder Protraction with Dowel  - 2 x daily - 2 sets - 10 reps - Sidelying Shoulder Abduction Palm Forward  - 1 x daily - 1 sets - 10 reps - 5 circles each way hold - Sidelying Shoulder ER with Towel and Dumbbell  - 1 x daily - 2 sets - 10 reps - 5 seconds hold - Seated Shoulder External Rotation PROM on Table  - 1-3 x daily - 2-3 sets - 10 reps - 5-10 seconds hold    ASSESSMENT:   CLINICAL IMPRESSION: Patient has attended 10 physical therapy sessions since starting current episode of care and is 10 weeks, 6 days s/p L humeral neck fracture on 07/24/2022. Patient is making steady progress towards goals as expected giving the nature and severity of his injury. Plan to continue with progressive ROM and strengthening exercises to help restore maximal function possible. Plan to continue with PT as outlined in original POC. Patient would benefit from continued management of limiting condition by skilled physical therapist to address remaining impairments and functional limitations to work towards stated goals and return to PLOF or maximal functional independence.   From initial PT eval 09/07/2022: Patient is a 77 y.o. male referred to outpatient physical therapy with a medical diagnosis of 4-part fracture of surgical neck of left humerus who presents with signs and symptoms consistent with R shoulder pain, stiffness, and weakness, and L UE swelling 2/2 proximal humerus fracture on 07/24/2022 after FOOSH. Patient presents with significant swelling, pain, joint stiffness, guarding, motor control, muscle tension, muscle performance (strength/power/endurance), swelling, tissue integrity, and activity tolerance impairments that are limiting ability to  complete any activities that require use of left UE,  showering, washing dishes, dressing, hygiene, working out at [employer] for health, sleeping, housework, cooking, throwing balls, some things he does left handed without difficulty. He currently has significant limitations due to pain with movement overhead with empty end feel with flexion and abduction. Patient will benefit from skilled physical therapy intervention to address current body structure impairments and activity limitations to improve function and work towards goals set in current POC in order to return to prior level of function or maximal functional improvement.    OBJECTIVE IMPAIRMENTS: decreased activity tolerance, decreased coordination, decreased endurance, decreased knowledge of condition, decreased mobility, decreased ROM, decreased strength, hypomobility, increased edema, increased fascial restrictions, impaired perceived functional ability, increased muscle spasms, impaired flexibility, impaired UE functional use, improper body mechanics, and pain.    ACTIVITY LIMITATIONS: carrying, lifting, sleeping, bathing, dressing, reach over head, hygiene/grooming, and caring for others   PARTICIPATION LIMITATIONS: meal prep, cleaning, interpersonal relationship, shopping, community activity, occupation, yard work, and   difficulty completing any activities that require use of left UE, showering, washing dishes, dressing, hygiene, working out at [employer] for health, sleeping, housework, cooking, throwing balls, some things he does left handed   PERSONAL FACTORS: Age, Past/current experiences, Time since onset of injury/illness/exacerbation, and 3+ comorbidities:   prostate cancer (dx Fall 2021, 40 radiation treatments in summer 2022, now doing well followed 1x a year), HTN, hyperlipidemia, asthma (as a child), ORIF L tibial Plateau (08/03/2019), OA, appendectomy, skin cancer (removed) are also affecting patient's functional outcome.    REHAB POTENTIAL: Good   CLINICAL DECISION MAKING:  Stable/uncomplicated   EVALUATION COMPLEXITY: Low     GOALS: Goals reviewed with patient? No   SHORT TERM GOALS: Target date: 09/21/2022   Patient will be independent with initial home exercise program for self-management of symptoms. Baseline: Initial HEP provided at [facility] (09/07/22); Goal status: In-progress     LONG TERM GOALS: Target date: 11/30/2022   Patient will be independent with a long-term home exercise program for self-management of symptoms.  Baseline: Initial HEP provided at [facility] (09/07/22); excellent participation (10/08/2022);  Goal status: In-progress   2.  Patient will demonstrate improved FOTO to equal or greater than 67 by visit #12 to demonstrate improvement in overall condition and self-reported functional ability.  Baseline: 44 (09/07/22); 61 at visit #8 (10/01/2022);  Goal status: In-progress   3.  Patient will demonstrate L shoulder AROM equal or greater than 130 degrees flexion, 110 degrees abduction,  80 degrees ER at 90 degrees abduction, and IR to L1 to improve his ability to dress, reach overhead, and throw balls.  Baseline: very limited  - see objective (09/07/22); continues to be limited but improving - see objective (10/08/2022);  Goal status: In-progress   4.  Patient will demonstrate L shoulder strength equal or greater than 4/5 MMT in flexion, abduction, ER, and IR to improve his ability to reach overhead, throw a ball, and dress. Baseline: testing deferred due to acuity of injury (09/07/22); resistive testing continues to be deferred but see AROM for strength (10/08/2022);  Goal status: In-progress   5.  Patient will complete community, work and/or recreational activities with equal or less than 75% limitation due to current condition.  Baseline: difficulty completing any activities that require use of left UE, showering, washing dishes, dressing, hygiene, working out at [employer] for health, sleeping, housework, cooking, throwing balls, some things he does  left handed (09/07/22); has noted improvement with ability  to use left UE for activities that do not require overhead motion or heavy force such as putting his shirttail in, holding low on the handle of the elliptical machine, reaching for the car door, washing dishes, flossing; still cannot shampoo hair with left hand (10/08/2022);  Goal status: In-progress   PLAN:   PT FREQUENCY: 1-2x/week   PT DURATION: 12 weeks   PLANNED INTERVENTIONS: Therapeutic exercises, Therapeutic activity, Neuromuscular re-education, Balance training, Gait training, Patient/Family education, Self Care, Joint mobilization, Dry Needling, Electrical stimulation, Spinal mobilization, Cryotherapy, Moist heat, Manual therapy, and Re-evaluation   PLAN FOR NEXT SESSION: Update HEP as appropriate, progressive PROM, AAROM, strengthening for posture, shoulder girdle, and UE. Manual therapy for pain control, improved ROM.    Cira RueSara R Jonus Coble, PT, DPT 10/08/2022, 12:04 PM  [facility] [address] [place], [place] [id number] P: [phone] I F: [phone]

## 2022-10-12 ENCOUNTER — Encounter: Payer: Self-pay | Admitting: Physical Therapy

## 2022-10-12 ENCOUNTER — Ambulatory Visit: Payer: PPO | Admitting: Physical Therapy

## 2022-10-12 VITALS — BP 134/92 | HR 64

## 2022-10-12 DIAGNOSIS — M25512 Pain in left shoulder: Secondary | ICD-10-CM

## 2022-10-12 DIAGNOSIS — M79622 Pain in left upper arm: Secondary | ICD-10-CM | POA: Diagnosis not present

## 2022-10-12 DIAGNOSIS — M6281 Muscle weakness (generalized): Secondary | ICD-10-CM

## 2022-10-12 DIAGNOSIS — M25612 Stiffness of left shoulder, not elsewhere classified: Secondary | ICD-10-CM

## 2022-10-12 NOTE — Therapy (Signed)
OUTPATIENT PHYSICAL THERAPY TREATMENT NOTE  Patient Name: Glenn Watson MRN: [id number] DOB:Jan 04, 1946, 77 y.o., male Today's Date: 10/12/2022  PCP: Marguarite Arbour, MD REFERRING PROVIDER: Deeann Saint, MD  END OF SESSION:   PT End of Session - 10/12/22 1033     Visit Number 11    Number of Visits 24    Date for PT Re-Evaluation 11/30/22    Authorization Type [employer] reporting period from 10/08/2022    Progress Note Due on Visit 10    PT Start Time 1031    PT Stop Time 1110    PT Time Calculation (min) 39 min    Activity Tolerance Patient tolerated treatment well;Patient limited by pain;Patient limited by fatigue    Behavior During Therapy [facility] for tasks assessed/performed              Past Medical History:  Diagnosis Date   Anemia    Asthma    as a child, exercise asthma - none since high school"   Cancer    skin cancer - basal, squamous- Mylenoma - stage 2- chest   Complication of anesthesia    "very sensitive to medications" "Had colonscopy with just Draminine"   Family history of adverse reaction to anesthesia    daughter- N/V   Hyperlipidemia    Hypertension    PONV (postoperative nausea and vomiting)    Past Surgical History:  Procedure Laterality Date   APPENDECTOMY  2011   Dr. Lemar Livings   COLONOSCOPY WITH PROPOFOL N/A 02/10/2017   Procedure: COLONOSCOPY WITH PROPOFOL;  Surgeon: Kieth Brightly, MD;  Location: [facility] ENDOSCOPY;  Service: Endoscopy;  Laterality: N/A;   ORIF TIBIA PLATEAU Left 08/03/2019   Procedure: OPEN REDUCTION INTERNAL FIXATION (ORIF) TIBIAL PLATEAU;  Surgeon: Myrene Galas, MD;  Location: [facility] OR;  Service: Orthopedics;  Laterality: Left;   Patient Active Problem List   Diagnosis Date Noted   Prostate cancer 03/04/2021   Hypertension    Hyperlipidemia    Asthma    Tibial plateau fracture, left, closed, with nonunion, subsequent encounter 08/03/2019   Closed fracture of shaft of tibia 03/28/2019   Retrocalcaneal  bursitis, right 04/05/2018   Chronic anemia 11/14/2013   OA (osteoarthritis) 11/14/2013    REFERRING DIAG: 4-part fracture of surgical neck of left humerus  THERAPY DIAG:  Pain in left upper arm  Stiffness of left shoulder, not elsewhere classified  Left shoulder pain, unspecified chronicity  Muscle weakness (generalized)  Rationale for Evaluation and Treatment: Rehabilitation  PERTINENT HISTORY: Patient is a 77 y.o. male who presents to outpatient physical therapy with a referral for medical diagnosis 4-part fracture of surgical neck of left humerus. This patient's chief complaints consist of left upper arm and shoulder pain, shoulder stiffness, and L UE weakness/swelling 2/2 L humeral neck fracture on 07/24/2022 leading to the following functional deficits: difficulty completing any activities that require use of left UE, showering, washing dishes, dressing, hygiene, working out at [employer] for health, sleeping, housework, cooking, throwing balls, some things he does left handed. Relevant past medical history and comorbidities include prostate cancer (dx Fall 2021, 40 radiation treatments in summer 2022, now doing well followed 1x a year), HTN, hyperlipidemia, asthma (as a child), ORIF L tibial Plateau (08/03/2019), OA, appendectomy, skin cancer (removed).  Patient denies hx of stroke, seizures, lung problems, heart problems, diabetes, unexplained weight loss, unexplained changes in bowel or bladder problems, unexplained stumbling or dropping things, osteoporosis, and spinal surgery   PRECAUTIONS: fracture 07/24/2022  SUBJECTIVE:  SUBJECTIVE STATEMENT:  Patient reports he is feeling well. He went home and an practices the ER stretch and found it much easier than in the clinic. Today the muscles he was stretching with  it are sore. He reports up to 1/10 pain with movement but 0/10 at rest.   PAIN:  Are you having pain? NPRS 0/10 left shoulder/upper arm when stationary.   OBJECTIVE  Vitals:   10/12/22 1053  BP: (!) 134/92  Pulse: 64  SpO2: 100%     AAROM L shoulder (last measured 10/12/22) Flexion: 125 Abduction: 12 (slight scaption plane) FIR: T9   TODAY'S TREATMENT:  Therapeutic exercise: to centralize symptoms and improve ROM, strength, muscular endurance, and activity tolerance required for successful completion of functional activities.  - seated AAROM L shoulder flexion with pulley, 1x10 with 5 second holds.  - seated AAROM L shoulder abduction with pulley, 1x10 with 5 second holds.  - seated  AAROM L shoulder FIR with pulley, 1x10 with 5 second hold.  - seated L shoulder AAROM ER stretch bending forwards at the waist with L UE on table, 1x10 with 5 second holds. - Seated AAROM  L shoulder ER at 80 degrees scaption with elbow propped on plinth, assisting with R finger. 1x10.  -seated in chair with buttocks scooted forwards and upper back leaning back, AAROM flexion with PVC stick 1x4 (too hard).  - supine L shoulder AAROM flexion with PVC stick with surface inclined 30 degrees. 1x10 with stick only, 1x4 pulling outwards against YTB loop.   Pt required multimodal cuing for proper technique and to facilitate improved neuromuscular control, strength, range of motion, and functional ability resulting in improved performance and form.   PATIENT EDUCATION: Education details: Exercise purpose/form. Self management techniques.  Reviewed cancelation/no-show policy with patient and confirmed patient has correct phone number for clinic; patient verbalized understanding (10/01/22). Person educated: Patient Education method: Explanation, Demonstration, Tactile cues, Verbal cues, and Handouts Education comprehension: verbalized understanding, returned demonstration, and needs further education   HOME  EXERCISE PROGRAM: Access Code: [email or web address] URL: [email or web address] Date: 10/08/2022 Prepared by: Norton Blizzard  Exercises - Seated Shoulder Flexion AAROM with Pulley Behind  - 3 x daily - 1 sets - 10-20 reps - 5-10 seconds hold - Seated Shoulder Abduction AAROM with Pulley Behind  - 3 x daily - 1 sets - 10-20 reps - 5-10 seconds hold - Seated Shoulder Internal Rotation AAROM with Pulley (Mirrored)  - 2 x daily - 10 reps - 10 seconds hold - Standing Isometric Shoulder Internal Rotation at Doorway  - 2 x daily - 1 sets - 10 reps - 5 seconds hold - Standing Anatomical Position with Scapular Retraction and Depression at Wall  - 2 x daily - 1 sets - 10 reps - 5 second hold hold - Supine Shoulder Flexion with Dowel  - 2 x daily - 2 sets - 10 reps - 5 seconds hold - Supine Shoulder Protraction with Dowel  - 2 x daily - 2 sets - 10 reps - Sidelying Shoulder Abduction Palm Forward  - 1 x daily - 1 sets - 10 reps - 5 circles each way hold - Sidelying Shoulder ER with Towel and Dumbbell  - 1 x daily - 2 sets - 10 reps - 5 seconds hold - Seated Shoulder External Rotation PROM on Table  - 1-3 x daily - 2-3 sets - 10 reps - 5-10 seconds hold    ASSESSMENT:   CLINICAL IMPRESSION: Patient arrives reporting well controlled pain  and good participation with HEP. Today's session continued with focus on improved ROM and strengthening exercise to improve function. Patient continues to have pain, weakness, and stiffness that prevents him from accessing full available AROM. He continues to require cuing from PT for correct form and exercise selection or modifications as needed. Patient felt lightheaded after first 3 stretches, so he was provided more rest, water, and a small snack of a chocolate chip cookie and a small handful of dry roasted unsalted peanuts. BP was found to be [facility] and he felt better so session was continued. Patient would benefit from continued management of limiting condition by skilled  physical therapist to address remaining impairments and functional limitations to work towards stated goals and return to PLOF or maximal functional independence.   From initial PT eval 09/07/2022: Patient is a 77 y.o. male referred to outpatient physical therapy with a medical diagnosis of 4-part fracture of surgical neck of left humerus who presents with signs and symptoms consistent with R shoulder pain, stiffness, and weakness, and L UE swelling 2/2 proximal humerus fracture on 07/24/2022 after FOOSH. Patient presents with significant swelling, pain, joint stiffness, guarding, motor control, muscle tension, muscle performance (strength/power/endurance), swelling, tissue integrity, and activity tolerance impairments that are limiting ability to complete any activities that require use of left UE, showering, washing dishes, dressing, hygiene, working out at [employer] for health, sleeping, housework, cooking, throwing balls, some things he does left handed without difficulty. He currently has significant limitations due to pain with movement overhead with empty end feel with flexion and abduction. Patient will benefit from skilled physical therapy intervention to address current body structure impairments and activity limitations to improve function and work towards goals set in current POC in order to return to prior level of function or maximal functional improvement.    OBJECTIVE IMPAIRMENTS: decreased activity tolerance, decreased coordination, decreased endurance, decreased knowledge of condition, decreased mobility, decreased ROM, decreased strength, hypomobility, increased edema, increased fascial restrictions, impaired perceived functional ability, increased muscle spasms, impaired flexibility, impaired UE functional use, improper body mechanics, and pain.    ACTIVITY LIMITATIONS: carrying, lifting, sleeping, bathing, dressing, reach over head, hygiene/grooming, and caring for others   PARTICIPATION  LIMITATIONS: meal prep, cleaning, interpersonal relationship, shopping, community activity, occupation, yard work, and   difficulty completing any activities that require use of left UE, showering, washing dishes, dressing, hygiene, working out at [employer] for health, sleeping, housework, cooking, throwing balls, some things he does left handed   PERSONAL FACTORS: Age, Past/current experiences, Time since onset of injury/illness/exacerbation, and 3+ comorbidities:   prostate cancer (dx Fall 2021, 40 radiation treatments in summer 2022, now doing well followed 1x a year), HTN, hyperlipidemia, asthma (as a child), ORIF L tibial Plateau (08/03/2019), OA, appendectomy, skin cancer (removed) are also affecting patient's functional outcome.    REHAB POTENTIAL: Good   CLINICAL DECISION MAKING: Stable/uncomplicated   EVALUATION COMPLEXITY: Low     GOALS: Goals reviewed with patient? No   SHORT TERM GOALS: Target date: 09/21/2022   Patient will be independent with initial home exercise program for self-management of symptoms. Baseline: Initial HEP provided at IE (09/07/22); Goal status: In-progress     LONG TERM GOALS: Target date: 11/30/2022   Patient will be independent with a long-term home exercise program for self-management of symptoms.  Baseline: Initial HEP provided at IE (09/07/22); excellent participation (10/08/2022);  Goal status: In-progress   2.  Patient will demonstrate improved FOTO to equal or greater than 67  by visit #12 to demonstrate improvement in overall condition and self-reported functional ability.  Baseline: 44 (09/07/22); 61 at visit #8 (10/01/2022);  Goal status: In-progress   3.  Patient will demonstrate L shoulder AROM equal or greater than 130 degrees flexion, 110 degrees abduction,  80 degrees ER at 90 degrees abduction, and IR to L1 to improve his ability to dress, reach overhead, and throw balls.  Baseline: very limited  - see objective (09/07/22); continues to be  limited but improving - see objective (10/08/2022);  Goal status: In-progress   4.  Patient will demonstrate L shoulder strength equal or greater than 4/5 MMT in flexion, abduction, ER, and IR to improve his ability to reach overhead, throw a ball, and dress. Baseline: testing deferred due to acuity of injury (09/07/22); resistive testing continues to be deferred but see AROM for strength (10/08/2022);  Goal status: In-progress   5.  Patient will complete community, work and/or recreational activities with equal or less than 75% limitation due to current condition.  Baseline: difficulty completing any activities that require use of left UE, showering, washing dishes, dressing, hygiene, working out at [employer] for health, sleeping, housework, cooking, throwing balls, some things he does left handed (09/07/22); has noted improvement with ability to use left UE for activities that do not require overhead motion or heavy force such as putting his shirttail in, holding low on the handle of the elliptical machine, reaching for the car door, washing dishes, flossing; still cannot shampoo hair with left hand (10/08/2022);  Goal status: In-progress   PLAN:   PT FREQUENCY: 1-2x/week   PT DURATION: 12 weeks   PLANNED INTERVENTIONS: Therapeutic exercises, Therapeutic activity, Neuromuscular re-education, Balance training, Gait training, Patient/Family education, Self Care, Joint mobilization, Dry Needling, Electrical stimulation, Spinal mobilization, Cryotherapy, Moist heat, Manual therapy, and Re-evaluation   PLAN FOR NEXT SESSION: Update HEP as appropriate, progressive PROM, AAROM, strengthening for posture, shoulder girdle, and UE. Manual therapy for pain control, improved ROM.    Cira Rue, PT, DPT 10/12/2022, 11:14 AM  [facility] [address] [place], [place] [address] P: [phone] I F: [phone]

## 2022-10-14 ENCOUNTER — Encounter: Payer: Self-pay | Admitting: Physical Therapy

## 2022-10-14 ENCOUNTER — Ambulatory Visit: Payer: PPO | Admitting: Physical Therapy

## 2022-10-14 DIAGNOSIS — M6281 Muscle weakness (generalized): Secondary | ICD-10-CM

## 2022-10-14 DIAGNOSIS — M79622 Pain in left upper arm: Secondary | ICD-10-CM | POA: Diagnosis not present

## 2022-10-14 DIAGNOSIS — M25612 Stiffness of left shoulder, not elsewhere classified: Secondary | ICD-10-CM

## 2022-10-14 DIAGNOSIS — M25512 Pain in left shoulder: Secondary | ICD-10-CM

## 2022-10-14 NOTE — Therapy (Signed)
OUTPATIENT PHYSICAL THERAPY TREATMENT NOTE  Patient Name: Glenn Watson MRN: [id number] DOB:[birth year], 77 y.o., male Today's Date: 10/14/2022  PCP: Marguarite Arbour, MD REFERRING PROVIDER: Deeann Saint, MD  END OF SESSION:   PT End of Session - 10/14/22 0908     Visit Number 12    Number of Visits 24    Date for PT Re-Evaluation 11/30/22    Authorization Type [employer] reporting period from 10/08/2022    Progress Note Due on Visit 10    PT Start Time 0904    PT Stop Time 0942    PT Time Calculation (min) 38 min    Activity Tolerance Patient tolerated treatment well;Patient limited by pain;Patient limited by fatigue    Behavior During Therapy [facility] for tasks assessed/performed             Past Medical History:  Diagnosis Date   Anemia    Asthma    as a child, exercise asthma - none since high school"   Cancer    skin cancer - basal, squamous- Mylenoma - stage 2- chest   Complication of anesthesia    "very sensitive to medications" "Had colonscopy with just Draminine"   Family history of adverse reaction to anesthesia    daughter- N/V   Hyperlipidemia    Hypertension    PONV (postoperative nausea and vomiting)    Past Surgical History:  Procedure Laterality Date   APPENDECTOMY  2011   Dr. Lemar Livings   COLONOSCOPY WITH PROPOFOL N/A 02/10/2017   Procedure: COLONOSCOPY WITH PROPOFOL;  Surgeon: Kieth Brightly, MD;  Location: [facility] ENDOSCOPY;  Service: Endoscopy;  Laterality: N/A;   ORIF TIBIA PLATEAU Left 08/03/2019   Procedure: OPEN REDUCTION INTERNAL FIXATION (ORIF) TIBIAL PLATEAU;  Surgeon: Myrene Galas, MD;  Location: [facility] OR;  Service: Orthopedics;  Laterality: Left;   Patient Active Problem List   Diagnosis Date Noted   Prostate cancer 03/04/2021   Hypertension    Hyperlipidemia    Asthma    Tibial plateau fracture, left, closed, with nonunion, subsequent encounter 08/03/2019   Closed fracture of shaft of tibia 03/28/2019   Retrocalcaneal  bursitis, right 04/05/2018   Chronic anemia 11/14/2013   OA (osteoarthritis) 11/14/2013    REFERRING DIAG: 4-part fracture of surgical neck of left humerus  THERAPY DIAG:  Pain in left upper arm  Stiffness of left shoulder, not elsewhere classified  Left shoulder pain, unspecified chronicity  Muscle weakness (generalized)  Rationale for Evaluation and Treatment: Rehabilitation  PERTINENT HISTORY: Patient is a 77 y.o. male who presents to outpatient physical therapy with a referral for medical diagnosis 4-part fracture of surgical neck of left humerus. This patient's chief complaints consist of left upper arm and shoulder pain, shoulder stiffness, and L UE weakness/swelling 2/2 L humeral neck fracture on 07/24/2022 leading to the following functional deficits: difficulty completing any activities that require use of left UE, showering, washing dishes, dressing, hygiene, working out at [employer] for health, sleeping, housework, cooking, throwing balls, some things he does left handed. Relevant past medical history and comorbidities include prostate cancer (dx Fall 2021, 40 radiation treatments in summer 2022, now doing well followed 1x a year), HTN, hyperlipidemia, asthma (as a child), ORIF L tibial Plateau (08/03/2019), OA, appendectomy, skin cancer (removed).  Patient denies hx of stroke, seizures, lung problems, heart problems, diabetes, unexplained weight loss, unexplained changes in bowel or bladder problems, unexplained stumbling or dropping things, osteoporosis, and spinal surgery   PRECAUTIONS: fracture 07/24/2022  SUBJECTIVE:  SUBJECTIVE STATEMENT:  Patient reports he is feeling well today. He states he warmed himself up prior to the visit.    PAIN:  Are you having pain? NPRS 0/10 left shoulder/upper arm when  stationary.   OBJECTIVE  AAROM L shoulder (last measured 10/14/22) Flexion: 125 Abduction: 130 (slight scaption plane) FIR: T9   TODAY'S TREATMENT:  Therapeutic exercise: to centralize symptoms and improve ROM, strength, muscular endurance, and activity tolerance required for successful completion of functional activities.  - seated AAROM L shoulder flexion with pulley, 1x10 with 5 second holds.  - seated AAROM L shoulder abduction with pulley, 1x10 with 5 second holds. - seated  AAROM L shoulder FIR with pulley, 1x10 with 5 second hold.  - appley pass from right UE to left UE in FIR 1x10 with pillow case.  - left shoulder AAROM flexion with UE ranger, 1x10 with progressively less assistance from R UE.  - left shoulder AAROM scaption with UE ranger, 1x10 with progressively less assistance from R UE.  - L shoulder AAROM wall slide between flexion/scaption 1x10 with progressively less assistance from R UE.  - seated B shoulder ER with towel roll under left elbow, with YTB and scapular retraction, 3x10.  - Education on HEP including handout   Pt required multimodal cuing for proper technique and to facilitate improved neuromuscular control, strength, range of motion, and functional ability resulting in improved performance and form.   PATIENT EDUCATION: Education details: Exercise purpose/form. Self management techniques.  Reviewed cancelation/no-show policy with patient and confirmed patient has correct phone number for clinic; patient verbalized understanding (10/01/22). Person educated: Patient Education method: Explanation, Demonstration, Tactile cues, Verbal cues, and Handouts Education comprehension: verbalized understanding, returned demonstration, and needs further education   HOME EXERCISE PROGRAM: Access Code: [email or web address] URL: [email or web address] Date: 10/14/2022 Prepared by: Norton Blizzard  Exercises - Supine Shoulder External Rotation in 45 Degrees Abduction  AAROM with Dowel  - 3 x daily - 1 sets - 10 reps - 10 seconds hold - Seated Shoulder Flexion AAROM with Pulley Behind  - 3 x daily - 1 sets - 10-20 reps - 5-10 seconds hold - Seated Shoulder Abduction AAROM with Pulley Behind  - 3 x daily - 1 sets - 10-20 reps - 5-10 seconds hold - Seated Shoulder Internal Rotation AAROM with Pulley (Mirrored)  - 2 x daily - 10 reps - 10 seconds hold - Standing Isometric Shoulder Internal Rotation at Doorway  - 2 x daily - 1 sets - 10 reps - 5 seconds hold - Standing Anatomical Position with Scapular Retraction and Depression at Wall  - 2 x daily - 1 sets - 10 reps - 5 second hold hold - Supine Shoulder Flexion with Dowel  - 2 x daily - 2 sets - 10 reps - 5 seconds hold - Supine Shoulder Protraction with Dowel  - 2 x daily - 2 sets - 10 reps - Sidelying Shoulder Abduction Palm Forward  - 1 x daily - 1 sets - 10 reps - 5 circles each way hold - Sidelying Shoulder ER with Towel and Dumbbell  - 1 x daily - 2 sets - 10 reps - 5 seconds hold - Seated Shoulder External Rotation PROM on Table  - 1-3 x daily - 2-3 sets - 10 reps - 5-10 seconds hold - Scaption Wall Slide with Towel  - 1 x daily - 1-3 sets - 10 reps - 5 seconds hold - Standing Shoulder External Rotation with Resistance  - 1 x daily -  3 sets - 10 reps    ASSESSMENT:   CLINICAL IMPRESSION: Patient arrives reporting good tolerance to last PT session and HEP. Today's session focused on progression of left shoulder strength and ROM. Patient continues to have pain and weakness with overhead motions but with no lingering pain afterwards. Patient continues to be very stiff in shoulder ER and has significant weakness in ER. Patient would benefit from continued management of limiting condition by skilled physical therapist to address remaining impairments and functional limitations to work towards stated goals and return to PLOF or maximal functional independence.   From initial PT eval 09/07/2022: Patient is a 77  y.o. male referred to outpatient physical therapy with a medical diagnosis of 4-part fracture of surgical neck of left humerus who presents with signs and symptoms consistent with R shoulder pain, stiffness, and weakness, and L UE swelling 2/2 proximal humerus fracture on 07/24/2022 after FOOSH. Patient presents with significant swelling, pain, joint stiffness, guarding, motor control, muscle tension, muscle performance (strength/power/endurance), swelling, tissue integrity, and activity tolerance impairments that are limiting ability to complete any activities that require use of left UE, showering, washing dishes, dressing, hygiene, working out at [employer] for health, sleeping, housework, cooking, throwing balls, some things he does left handed without difficulty. He currently has significant limitations due to pain with movement overhead with empty end feel with flexion and abduction. Patient will benefit from skilled physical therapy intervention to address current body structure impairments and activity limitations to improve function and work towards goals set in current POC in order to return to prior level of function or maximal functional improvement.    OBJECTIVE IMPAIRMENTS: decreased activity tolerance, decreased coordination, decreased endurance, decreased knowledge of condition, decreased mobility, decreased ROM, decreased strength, hypomobility, increased edema, increased fascial restrictions, impaired perceived functional ability, increased muscle spasms, impaired flexibility, impaired UE functional use, improper body mechanics, and pain.    ACTIVITY LIMITATIONS: carrying, lifting, sleeping, bathing, dressing, reach over head, hygiene/grooming, and caring for others   PARTICIPATION LIMITATIONS: meal prep, cleaning, interpersonal relationship, shopping, community activity, occupation, yard work, and   difficulty completing any activities that require use of left UE, showering, washing dishes,  dressing, hygiene, working out at [employer] for health, sleeping, housework, cooking, throwing balls, some things he does left handed   PERSONAL FACTORS: Age, Past/current experiences, Time since onset of injury/illness/exacerbation, and 3+ comorbidities:   prostate cancer (dx Fall 2021, 40 radiation treatments in summer 2022, now doing well followed 1x a year), HTN, hyperlipidemia, asthma (as a child), ORIF L tibial Plateau (08/03/2019), OA, appendectomy, skin cancer (removed) are also affecting patient's functional outcome.    REHAB POTENTIAL: Good   CLINICAL DECISION MAKING: Stable/uncomplicated   EVALUATION COMPLEXITY: Low     GOALS: Goals reviewed with patient? No   SHORT TERM GOALS: Target date: 09/21/2022   Patient will be independent with initial home exercise program for self-management of symptoms. Baseline: Initial HEP provided at IE (09/07/22); Goal status: In-progress     LONG TERM GOALS: Target date: 11/30/2022   Patient will be independent with a long-term home exercise program for self-management of symptoms.  Baseline: Initial HEP provided at [facility] (09/07/22); excellent participation (10/08/2022);  Goal status: In-progress   2.  Patient will demonstrate improved FOTO to equal or greater than 67 by visit #12 to demonstrate improvement in overall condition and self-reported functional ability.  Baseline: 44 (09/07/22); 61 at visit #8 (10/01/2022);  Goal status: In-progress   3.  Patient will  demonstrate L shoulder AROM equal or greater than 130 degrees flexion, 110 degrees abduction,  80 degrees ER at 90 degrees abduction, and IR to L1 to improve his ability to dress, reach overhead, and throw balls.  Baseline: very limited  - see objective (09/07/22); continues to be limited but improving - see objective (10/08/2022);  Goal status: In-progress   4.  Patient will demonstrate L shoulder strength equal or greater than 4/5 MMT in flexion, abduction, ER, and IR to improve his ability  to reach overhead, throw a ball, and dress. Baseline: testing deferred due to acuity of injury (09/07/22); resistive testing continues to be deferred but see AROM for strength (10/08/2022);  Goal status: In-progress   5.  Patient will complete community, work and/or recreational activities with equal or less than 75% limitation due to current condition.  Baseline: difficulty completing any activities that require use of left UE, showering, washing dishes, dressing, hygiene, working out at [employer] for health, sleeping, housework, cooking, throwing balls, some things he does left handed (09/07/22); has noted improvement with ability to use left UE for activities that do not require overhead motion or heavy force such as putting his shirttail in, holding low on the handle of the elliptical machine, reaching for the car door, washing dishes, flossing; still cannot shampoo hair with left hand (10/08/2022);  Goal status: In-progress   PLAN:   PT FREQUENCY: 1-2x/week   PT DURATION: 12 weeks   PLANNED INTERVENTIONS: Therapeutic exercises, Therapeutic activity, Neuromuscular re-education, Balance training, Gait training, Patient/Family education, Self Care, Joint mobilization, Dry Needling, Electrical stimulation, Spinal mobilization, Cryotherapy, Moist heat, Manual therapy, and Re-evaluation   PLAN FOR NEXT SESSION: Update HEP as appropriate, progressive PROM, AAROM, strengthening for posture, shoulder girdle, and UE. Manual therapy for pain control, improved ROM.    Cira Rue, PT, DPT 10/14/2022, 10:28 AM  [facility] [address] [place], [place] [address] P: [phone] I F: [phone]

## 2022-10-19 ENCOUNTER — Encounter: Payer: Self-pay | Admitting: Physical Therapy

## 2022-10-19 ENCOUNTER — Ambulatory Visit: Payer: PPO | Admitting: Physical Therapy

## 2022-10-19 DIAGNOSIS — M25612 Stiffness of left shoulder, not elsewhere classified: Secondary | ICD-10-CM

## 2022-10-19 DIAGNOSIS — M25512 Pain in left shoulder: Secondary | ICD-10-CM

## 2022-10-19 DIAGNOSIS — M79622 Pain in left upper arm: Secondary | ICD-10-CM

## 2022-10-19 DIAGNOSIS — M6281 Muscle weakness (generalized): Secondary | ICD-10-CM

## 2022-10-19 NOTE — Therapy (Signed)
OUTPATIENT PHYSICAL THERAPY TREATMENT NOTE  Patient Name: Glenn Watson MRN: [id number] DOB:[birth year], 77 y.o., male Today's Date: 10/19/2022  PCP: Marguarite Arbour, MD REFERRING PROVIDER: Deeann Saint, MD  END OF SESSION:   PT End of Session - 10/19/22 1354     Visit Number 13    Number of Visits 24    Date for PT Re-Evaluation 11/30/22    Authorization Type [employer] reporting period from 10/08/2022    Progress Note Due on Visit 20    PT Start Time 1350    PT Stop Time 1428    PT Time Calculation (min) 38 min    Activity Tolerance Patient tolerated treatment well;Patient limited by pain;Patient limited by fatigue    Behavior During Therapy [facility] for tasks assessed/performed              Past Medical History:  Diagnosis Date   Anemia    Asthma    as a child, exercise asthma - none since high school"   Cancer    skin cancer - basal, squamous- Mylenoma - stage 2- chest   Complication of anesthesia    "very sensitive to medications" "Had colonscopy with just Draminine"   Family history of adverse reaction to anesthesia    daughter- N/V   Hyperlipidemia    Hypertension    PONV (postoperative nausea and vomiting)    Past Surgical History:  Procedure Laterality Date   APPENDECTOMY  2011   Dr. Lemar Livings   COLONOSCOPY WITH PROPOFOL N/A 02/10/2017   Procedure: COLONOSCOPY WITH PROPOFOL;  Surgeon: Kieth Brightly, MD;  Location: [facility] ENDOSCOPY;  Service: Endoscopy;  Laterality: N/A;   ORIF TIBIA PLATEAU Left 08/03/2019   Procedure: OPEN REDUCTION INTERNAL FIXATION (ORIF) TIBIAL PLATEAU;  Surgeon: Myrene Galas, MD;  Location: [facility] OR;  Service: Orthopedics;  Laterality: Left;   Patient Active Problem List   Diagnosis Date Noted   Prostate cancer 03/04/2021   Hypertension    Hyperlipidemia    Asthma    Tibial plateau fracture, left, closed, with nonunion, subsequent encounter 08/03/2019   Closed fracture of shaft of tibia 03/28/2019   Retrocalcaneal  bursitis, right 04/05/2018   Chronic anemia 11/14/2013   OA (osteoarthritis) 11/14/2013    REFERRING DIAG: 4-part fracture of surgical neck of left humerus  THERAPY DIAG:  Pain in left upper arm  Stiffness of left shoulder, not elsewhere classified  Left shoulder pain, unspecified chronicity  Muscle weakness (generalized)  Rationale for Evaluation and Treatment: Rehabilitation  PERTINENT HISTORY: Patient is a 77 y.o. male who presents to outpatient physical therapy with a referral for medical diagnosis 4-part fracture of surgical neck of left humerus. This patient's chief complaints consist of left upper arm and shoulder pain, shoulder stiffness, and L UE weakness/swelling 2/2 L humeral neck fracture on 07/24/2022 leading to the following functional deficits: difficulty completing any activities that require use of left UE, showering, washing dishes, dressing, hygiene, working out at [employer] for health, sleeping, housework, cooking, throwing balls, some things he does left handed. Relevant past medical history and comorbidities include prostate cancer (dx Fall 2021, 40 radiation treatments in summer 2022, now doing well followed 1x a year), HTN, hyperlipidemia, asthma (as a child), ORIF L tibial Plateau (08/03/2019), OA, appendectomy, skin cancer (removed).  Patient denies hx of stroke, seizures, lung problems, heart problems, diabetes, unexplained weight loss, unexplained changes in bowel or bladder problems, unexplained stumbling or dropping things, osteoporosis, and spinal surgery   PRECAUTIONS: fracture 07/24/2022  SUBJECTIVE:  SUBJECTIVE STATEMENT:  Patient reports he is feeling well and his L shoulder and arm is doing well. He had his grandchildren over the weekend and they were "wide open" from morning to  evening. He went to the gym today and tried to use his left UE on the elliptical handle as much as he could.    PAIN:  Are you having pain? NPRS 0/10 left shoulder/upper arm when stationary.   OBJECTIVE   TODAY'S TREATMENT:  Therapeutic exercise: to centralize symptoms and improve ROM, strength, muscular endurance, and activity tolerance required for successful completion of functional activities.  - standing flexion wall slide, AAROM with help from R UE, 2x10 with 5 second hold.  - standing scaption wall slide, AAROM with help from R UE, 2x10 with 5 second hold.  - standing L shoulder ER stretch in doorway, assistance from PT to get arm to/from correct position. 2x10 with 5 second holds.  - standing B shoulder ER with towel roll under left elbow, with YTB and scapular retraction, 3x10.  - left shoulder AAROM scaption with UE ranger, 2x10 with progressively less assistance from R UE.  - appley pass from right UE to left UE in FIR 2x10 with pillow case.  - standing B flexion with isometric horizontal abduction against YTB loop, 1x10  Pt required multimodal cuing for proper technique and to facilitate improved neuromuscular control, strength, range of motion, and functional ability resulting in improved performance and form.   PATIENT EDUCATION: Education details: Exercise purpose/form. Self management techniques.  Reviewed cancelation/no-show policy with patient and confirmed patient has correct phone number for clinic; patient verbalized understanding (10/01/22). Person educated: Patient Education method: Explanation, Demonstration, Tactile cues, Verbal cues, and Handouts Education comprehension: verbalized understanding, returned demonstration, and needs further education   HOME EXERCISE PROGRAM: Access Code: [email or web address] URL: [email or web address] Date: 10/14/2022 Prepared by: Norton Blizzard  Exercises - Supine Shoulder External Rotation in 45 Degrees Abduction AAROM with  Dowel  - 3 x daily - 1 sets - 10 reps - 10 seconds hold - Seated Shoulder Flexion AAROM with Pulley Behind  - 3 x daily - 1 sets - 10-20 reps - 5-10 seconds hold - Seated Shoulder Abduction AAROM with Pulley Behind  - 3 x daily - 1 sets - 10-20 reps - 5-10 seconds hold - Seated Shoulder Internal Rotation AAROM with Pulley (Mirrored)  - 2 x daily - 10 reps - 10 seconds hold - Standing Isometric Shoulder Internal Rotation at Doorway  - 2 x daily - 1 sets - 10 reps - 5 seconds hold - Standing Anatomical Position with Scapular Retraction and Depression at Wall  - 2 x daily - 1 sets - 10 reps - 5 second hold hold - Supine Shoulder Flexion with Dowel  - 2 x daily - 2 sets - 10 reps - 5 seconds hold - Supine Shoulder Protraction with Dowel  - 2 x daily - 2 sets - 10 reps - Sidelying Shoulder Abduction Palm Forward  - 1 x daily - 1 sets - 10 reps - 5 circles each way hold - Sidelying Shoulder ER with Towel and Dumbbell  - 1 x daily - 2 sets - 10 reps - 5 seconds hold - Seated Shoulder External Rotation PROM on Table  - 1-3 x daily - 2-3 sets - 10 reps - 5-10 seconds hold - Scaption Wall Slide with Towel  - 1 x daily - 1-3 sets - 10 reps - 5 seconds hold - Standing Shoulder External Rotation with  Resistance  - 1 x daily - 3 sets - 10 reps    ASSESSMENT:   CLINICAL IMPRESSION: Patient arrives reporting good tolerance to last PT session and HEP. Today's session continued to focus on progressive ROM and strengthening to improve L UE function. Patient continues to require physical support from PT at times to help with [facility] and he needs cuing for effective performance of exercises. Patient continues to make appropriate progress. He had not increase in pain by end of session. Patient would benefit from continued management of limiting condition by skilled physical therapist to address remaining impairments and functional limitations to work towards stated goals and return to PLOF or maximal functional  independence.  From initial PT eval 09/07/2022: Patient is a 77 y.o. male referred to outpatient physical therapy with a medical diagnosis of 4-part fracture of surgical neck of left humerus who presents with signs and symptoms consistent with R shoulder pain, stiffness, and weakness, and L UE swelling 2/2 proximal humerus fracture on 07/24/2022 after FOOSH. Patient presents with significant swelling, pain, joint stiffness, guarding, motor control, muscle tension, muscle performance (strength/power/endurance), swelling, tissue integrity, and activity tolerance impairments that are limiting ability to complete any activities that require use of left UE, showering, washing dishes, dressing, hygiene, working out at [employer] for health, sleeping, housework, cooking, throwing balls, some things he does left handed without difficulty. He currently has significant limitations due to pain with movement overhead with empty end feel with flexion and abduction. Patient will benefit from skilled physical therapy intervention to address current body structure impairments and activity limitations to improve function and work towards goals set in current POC in order to return to prior level of function or maximal functional improvement.    OBJECTIVE IMPAIRMENTS: decreased activity tolerance, decreased coordination, decreased endurance, decreased knowledge of condition, decreased mobility, decreased ROM, decreased strength, hypomobility, increased edema, increased fascial restrictions, impaired perceived functional ability, increased muscle spasms, impaired flexibility, impaired UE functional use, improper body mechanics, and pain.    ACTIVITY LIMITATIONS: carrying, lifting, sleeping, bathing, dressing, reach over head, hygiene/grooming, and caring for others   PARTICIPATION LIMITATIONS: meal prep, cleaning, interpersonal relationship, shopping, community activity, occupation, yard work, and   difficulty completing any  activities that require use of left UE, showering, washing dishes, dressing, hygiene, working out at [employer] for health, sleeping, housework, cooking, throwing balls, some things he does left handed   PERSONAL FACTORS: Age, Past/current experiences, Time since onset of injury/illness/exacerbation, and 3+ comorbidities:   prostate cancer (dx Fall 2021, 40 radiation treatments in summer 2022, now doing well followed 1x a year), HTN, hyperlipidemia, asthma (as a child), ORIF L tibial Plateau (08/03/2019), OA, appendectomy, skin cancer (removed) are also affecting patient's functional outcome.    REHAB POTENTIAL: Good   CLINICAL DECISION MAKING: Stable/uncomplicated   EVALUATION COMPLEXITY: Low     GOALS: Goals reviewed with patient? No   SHORT TERM GOALS: Target date: 09/21/2022   Patient will be independent with initial home exercise program for self-management of symptoms. Baseline: Initial HEP provided at IE (09/07/22); Goal status: In-progress     LONG TERM GOALS: Target date: 11/30/2022   Patient will be independent with a long-term home exercise program for self-management of symptoms.  Baseline: Initial HEP provided at IE (09/07/22); excellent participation (10/08/2022);  Goal status: In-progress   2.  Patient will demonstrate improved FOTO to equal or greater than 67 by visit #12 to demonstrate improvement in overall condition and self-reported functional ability.  Baseline: 44 (09/07/22); 61 at visit #8 (10/01/2022);  Goal status: In-progress   3.  Patient will demonstrate L shoulder AROM equal or greater than 130 degrees flexion, 110 degrees abduction,  80 degrees ER at 90 degrees abduction, and IR to L1 to improve his ability to dress, reach overhead, and throw balls.  Baseline: very limited  - see objective (09/07/22); continues to be limited but improving - see objective (10/08/2022);  Goal status: In-progress   4.  Patient will demonstrate L shoulder strength equal or greater  than 4/5 MMT in flexion, abduction, ER, and IR to improve his ability to reach overhead, throw a ball, and dress. Baseline: testing deferred due to acuity of injury (09/07/22); resistive testing continues to be deferred but see AROM for strength (10/08/2022);  Goal status: In-progress   5.  Patient will complete community, work and/or recreational activities with equal or less than 75% limitation due to current condition.  Baseline: difficulty completing any activities that require use of left UE, showering, washing dishes, dressing, hygiene, working out at [employer] for health, sleeping, housework, cooking, throwing balls, some things he does left handed (09/07/22); has noted improvement with ability to use left UE for activities that do not require overhead motion or heavy force such as putting his shirttail in, holding low on the handle of the elliptical machine, reaching for the car door, washing dishes, flossing; still cannot shampoo hair with left hand (10/08/2022);  Goal status: In-progress   PLAN:   PT FREQUENCY: 1-2x/week   PT DURATION: 12 weeks   PLANNED INTERVENTIONS: Therapeutic exercises, Therapeutic activity, Neuromuscular re-education, Balance training, Gait training, Patient/Family education, Self Care, Joint mobilization, Dry Needling, Electrical stimulation, Spinal mobilization, Cryotherapy, Moist heat, Manual therapy, and Re-evaluation   PLAN FOR NEXT SESSION: Update HEP as appropriate, progressive PROM, AAROM, strengthening for posture, shoulder girdle, and UE. Manual therapy for pain control, improved ROM.    Cira Rue, PT, DPT 10/19/2022, 2:33 PM  [facility] [address] [place], [place] [address] P: [phone] I F: [phone]

## 2022-10-21 ENCOUNTER — Ambulatory Visit: Payer: PPO | Admitting: Physical Therapy

## 2022-10-21 ENCOUNTER — Encounter: Payer: Self-pay | Admitting: Physical Therapy

## 2022-10-21 DIAGNOSIS — M79622 Pain in left upper arm: Secondary | ICD-10-CM | POA: Diagnosis not present

## 2022-10-21 DIAGNOSIS — M25612 Stiffness of left shoulder, not elsewhere classified: Secondary | ICD-10-CM

## 2022-10-21 DIAGNOSIS — M25512 Pain in left shoulder: Secondary | ICD-10-CM

## 2022-10-21 DIAGNOSIS — M6281 Muscle weakness (generalized): Secondary | ICD-10-CM

## 2022-10-21 NOTE — Therapy (Signed)
OUTPATIENT PHYSICAL THERAPY TREATMENT NOTE  Patient Name: Glenn Watson MRN: [id number] DOB:[birth year], 77 y.o., male Today's Date: 10/21/2022  PCP: Marguarite Arbour, MD REFERRING PROVIDER: Deeann Saint, MD  END OF SESSION:   PT End of Session - 10/21/22 1121     Visit Number 14    Number of Visits 24    Date for PT Re-Evaluation 11/30/22    Authorization Type [employer] reporting period from 10/08/2022    Progress Note Due on Visit 20    PT Start Time 1120    PT Stop Time 1200    PT Time Calculation (min) 40 min    Activity Tolerance Patient tolerated treatment well;Patient limited by pain;Patient limited by fatigue    Behavior During Therapy [facility] for tasks assessed/performed               Past Medical History:  Diagnosis Date   Anemia    Asthma    as a child, exercise asthma - none since high school"   Cancer    skin cancer - basal, squamous- Mylenoma - stage 2- chest   Complication of anesthesia    "very sensitive to medications" "Had colonscopy with just Draminine"   Family history of adverse reaction to anesthesia    daughter- N/V   Hyperlipidemia    Hypertension    PONV (postoperative nausea and vomiting)    Past Surgical History:  Procedure Laterality Date   APPENDECTOMY  2011   Dr. Lemar Livings   COLONOSCOPY WITH PROPOFOL N/A 02/10/2017   Procedure: COLONOSCOPY WITH PROPOFOL;  Surgeon: Kieth Brightly, MD;  Location: [facility] ENDOSCOPY;  Service: Endoscopy;  Laterality: N/A;   ORIF TIBIA PLATEAU Left 08/03/2019   Procedure: OPEN REDUCTION INTERNAL FIXATION (ORIF) TIBIAL PLATEAU;  Surgeon: Myrene Galas, MD;  Location: [facility] OR;  Service: Orthopedics;  Laterality: Left;   Patient Active Problem List   Diagnosis Date Noted   Prostate cancer 03/04/2021   Hypertension    Hyperlipidemia    Asthma    Tibial plateau fracture, left, closed, with nonunion, subsequent encounter 08/03/2019   Closed fracture of shaft of tibia 03/28/2019    Retrocalcaneal bursitis, right 04/05/2018   Chronic anemia 11/14/2013   OA (osteoarthritis) 11/14/2013    REFERRING DIAG: 4-part fracture of surgical neck of left humerus  THERAPY DIAG:  Pain in left upper arm  Stiffness of left shoulder, not elsewhere classified  Left shoulder pain, unspecified chronicity  Muscle weakness (generalized)  Rationale for Evaluation and Treatment: Rehabilitation  PERTINENT HISTORY: Patient is a 77 y.o. male who presents to outpatient physical therapy with a referral for medical diagnosis 4-part fracture of surgical neck of left humerus. This patient's chief complaints consist of left upper arm and shoulder pain, shoulder stiffness, and L UE weakness/swelling 2/2 L humeral neck fracture on 07/24/2022 leading to the following functional deficits: difficulty completing any activities that require use of left UE, showering, washing dishes, dressing, hygiene, working out at [employer] for health, sleeping, housework, cooking, throwing balls, some things he does left handed. Relevant past medical history and comorbidities include prostate cancer (dx Fall 2021, 40 radiation treatments in summer 2022, now doing well followed 1x a year), HTN, hyperlipidemia, asthma (as a child), ORIF L tibial Plateau (08/03/2019), OA, appendectomy, skin cancer (removed).  Patient denies hx of stroke, seizures, lung problems, heart problems, diabetes, unexplained weight loss, unexplained changes in bowel or bladder problems, unexplained stumbling or dropping things, osteoporosis, and spinal surgery   PRECAUTIONS: fracture 07/24/2022  SUBJECTIVE:                                                                                                                                                                                      SUBJECTIVE STATEMENT:  Patient reports he is feeling well and his L shoulder and arm is doing well. He had his grandchildren over the weekend and they were "wide open" from  morning to evening. He went to the gym today and tried to use his left UE on the elliptical handle as much as he could.    PAIN:  Are you having pain? NPRS 0/10 left shoulder/upper arm when stationary.   OBJECTIVE   TODAY'S TREATMENT:  Therapeutic exercise: to centralize symptoms and improve ROM, strength, muscular endurance, and activity tolerance required for successful completion of functional activities.  - seated L shoulder AAROM pulley, 1x10 flexion, 1x10 abduction.  - standing flexion wall slide, AAROM with help from R UE, 1x10 with 5 second hold.  - standing scaption wall slide, AAROM with help from R UE, 1x10 with 5 second hold.   AAROM to get to/from position:  - sidelying left shoulder abduction hold at 90 degrees,1x30 seconds - sidelying left shoulder abduction hold at 90 degrees with ER/IR AROM 30 seconds - sidelying left shoulder abdution hold with perturbations, approx  4 directions at elbow with predictable pattern, 3x30 seconds.   - standing L shoulder ER stretch in doorway, assistance from PT to get arm to/from correct position and support hold. 3x 30 second holds.  - left shoulder AAROM scaption with UE ranger, 1x10 with min A some reps assistance from R UE.   Pt required multimodal cuing for proper technique and to facilitate improved neuromuscular control, strength, range of motion, and functional ability resulting in improved performance and form.   PATIENT EDUCATION: Education details: Exercise purpose/form. Self management techniques.  Reviewed cancelation/no-show policy with patient and confirmed patient has correct phone number for clinic; patient verbalized understanding (10/01/22). Person educated: Patient Education method: Explanation, Demonstration, Tactile cues, Verbal cues, and Handouts Education comprehension: verbalized understanding, returned demonstration, and needs further education   HOME EXERCISE PROGRAM: Access Code: [email or web address] URL:  [email or web address] Date: 10/14/2022 Prepared by: Norton Blizzard  Exercises - Supine Shoulder External Rotation in 45 Degrees Abduction AAROM with Dowel  - 3 x daily - 1 sets - 10 reps - 10 seconds hold - Seated Shoulder Flexion AAROM with Pulley Behind  - 3 x daily - 1 sets - 10-20 reps - 5-10 seconds hold - Seated Shoulder Abduction AAROM with Pulley Behind  - 3 x daily - 1 sets - 10-20 reps - 5-10 seconds hold - Seated  Shoulder Internal Rotation AAROM with Pulley (Mirrored)  - 2 x daily - 10 reps - 10 seconds hold - Standing Isometric Shoulder Internal Rotation at Doorway  - 2 x daily - 1 sets - 10 reps - 5 seconds hold - Standing Anatomical Position with Scapular Retraction and Depression at Wall  - 2 x daily - 1 sets - 10 reps - 5 second hold hold - Supine Shoulder Flexion with Dowel  - 2 x daily - 2 sets - 10 reps - 5 seconds hold - Supine Shoulder Protraction with Dowel  - 2 x daily - 2 sets - 10 reps - Sidelying Shoulder Abduction Palm Forward  - 1 x daily - 1 sets - 10 reps - 5 circles each way hold - Sidelying Shoulder ER with Towel and Dumbbell  - 1 x daily - 2 sets - 10 reps - 5 seconds hold - Seated Shoulder External Rotation PROM on Table  - 1-3 x daily - 2-3 sets - 10 reps - 5-10 seconds hold - Scaption Wall Slide with Towel  - 1 x daily - 1-3 sets - 10 reps - 5 seconds hold - Standing Shoulder External Rotation with Resistance  - 1 x daily - 3 sets - 10 reps    ASSESSMENT:   CLINICAL IMPRESSION: Patient arrives reporting increased soreness with increased completion of wall slide exercise at home. Today's session continued to focus on improving ROM and strength to improve functional use of left UE. Patient continued to be limited by pain and weakness but demonstrates strong motivation and effort. He continues to require cuing from PT for exercise selection and form and physical assist to maximize effectiveness of exercises. Patient would benefit from continued  management of limiting condition by skilled physical therapist to address remaining impairments and functional limitations to work towards stated goals and return to PLOF or maximal functional independence.  From initial PT eval 09/07/2022: Patient is a 77 y.o. male referred to outpatient physical therapy with a medical diagnosis of 4-part fracture of surgical neck of left humerus who presents with signs and symptoms consistent with R shoulder pain, stiffness, and weakness, and L UE swelling 2/2 proximal humerus fracture on 07/24/2022 after FOOSH. Patient presents with significant swelling, pain, joint stiffness, guarding, motor control, muscle tension, muscle performance (strength/power/endurance), swelling, tissue integrity, and activity tolerance impairments that are limiting ability to complete any activities that require use of left UE, showering, washing dishes, dressing, hygiene, working out at [employer] for health, sleeping, housework, cooking, throwing balls, some things he does left handed without difficulty. He currently has significant limitations due to pain with movement overhead with empty end feel with flexion and abduction. Patient will benefit from skilled physical therapy intervention to address current body structure impairments and activity limitations to improve function and work towards goals set in current POC in order to return to prior level of function or maximal functional improvement.    OBJECTIVE IMPAIRMENTS: decreased activity tolerance, decreased coordination, decreased endurance, decreased knowledge of condition, decreased mobility, decreased ROM, decreased strength, hypomobility, increased edema, increased fascial restrictions, impaired perceived functional ability, increased muscle spasms, impaired flexibility, impaired UE functional use, improper body mechanics, and pain.    ACTIVITY LIMITATIONS: carrying, lifting, sleeping, bathing, dressing, reach over head, hygiene/grooming,  and caring for others   PARTICIPATION LIMITATIONS: meal prep, cleaning, interpersonal relationship, shopping, community activity, occupation, yard work, and   difficulty completing any activities that require use of left UE, showering, washing dishes, dressing, hygiene, working  out at the gym for health, sleeping, housework, cooking, throwing balls, some things he does left handed   PERSONAL FACTORS: Age, Past/current experiences, Time since onset of injury/illness/exacerbation, and 3+ comorbidities:   prostate cancer (dx Fall 2021, 40 radiation treatments in summer 2022, now doing well followed 1x a year), HTN, hyperlipidemia, asthma (as a child), ORIF L tibial Plateau (08/03/2019), OA, appendectomy, skin cancer (removed) are also affecting patient's functional outcome.    REHAB POTENTIAL: Good   CLINICAL DECISION MAKING: Stable/uncomplicated   EVALUATION COMPLEXITY: Low     GOALS: Goals reviewed with patient? No   SHORT TERM GOALS: Target date: 09/21/2022   Patient will be independent with initial home exercise program for self-management of symptoms. Baseline: Initial HEP provided at IE (09/07/22); Goal status: In-progress     LONG TERM GOALS: Target date: 11/30/2022   Patient will be independent with a long-term home exercise program for self-management of symptoms.  Baseline: Initial HEP provided at IE (09/07/22); excellent participation (10/08/2022);  Goal status: In-progress   2.  Patient will demonstrate improved FOTO to equal or greater than 67 by visit #12 to demonstrate improvement in overall condition and self-reported functional ability.  Baseline: 44 (09/07/22); 61 at visit #8 (10/01/2022);  Goal status: In-progress   3.  Patient will demonstrate L shoulder AROM equal or greater than 130 degrees flexion, 110 degrees abduction,  80 degrees ER at 90 degrees abduction, and IR to L1 to improve his ability to dress, reach overhead, and throw balls.  Baseline: very limited  - see  objective (09/07/22); continues to be limited but improving - see objective (10/08/2022);  Goal status: In-progress   4.  Patient will demonstrate L shoulder strength equal or greater than 4/5 MMT in flexion, abduction, ER, and IR to improve his ability to reach overhead, throw a ball, and dress. Baseline: testing deferred due to acuity of injury (09/07/22); resistive testing continues to be deferred but see AROM for strength (10/08/2022);  Goal status: In-progress   5.  Patient will complete community, work and/or recreational activities with equal or less than 75% limitation due to current condition.  Baseline: difficulty completing any activities that require use of left UE, showering, washing dishes, dressing, hygiene, working out at [employer] for health, sleeping, housework, cooking, throwing balls, some things he does left handed (09/07/22); has noted improvement with ability to use left UE for activities that do not require overhead motion or heavy force such as putting his shirttail in, holding low on the handle of the elliptical machine, reaching for the car door, washing dishes, flossing; still cannot shampoo hair with left hand (10/08/2022);  Goal status: In-progress   PLAN:   PT FREQUENCY: 1-2x/week   PT DURATION: 12 weeks   PLANNED INTERVENTIONS: Therapeutic exercises, Therapeutic activity, Neuromuscular re-education, Balance training, Gait training, Patient/Family education, Self Care, Joint mobilization, Dry Needling, Electrical stimulation, Spinal mobilization, Cryotherapy, Moist heat, Manual therapy, and Re-evaluation   PLAN FOR NEXT SESSION: Update HEP as appropriate, progressive PROM, AAROM, strengthening for posture, shoulder girdle, and UE. Manual therapy for pain control, improved ROM.    Cira Rue, PT, DPT 10/21/2022, 12:05 PM  [facility] [address] [place], [place] [address] P: [phone] I F: [phone]

## 2022-10-27 ENCOUNTER — Encounter: Payer: Self-pay | Admitting: Physical Therapy

## 2022-10-27 ENCOUNTER — Ambulatory Visit: Payer: PPO | Admitting: Physical Therapy

## 2022-10-27 DIAGNOSIS — M25612 Stiffness of left shoulder, not elsewhere classified: Secondary | ICD-10-CM

## 2022-10-27 DIAGNOSIS — M79622 Pain in left upper arm: Secondary | ICD-10-CM | POA: Diagnosis not present

## 2022-10-27 DIAGNOSIS — M6281 Muscle weakness (generalized): Secondary | ICD-10-CM

## 2022-10-27 DIAGNOSIS — M25512 Pain in left shoulder: Secondary | ICD-10-CM

## 2022-10-27 NOTE — Therapy (Signed)
OUTPATIENT PHYSICAL THERAPY TREATMENT NOTE  Patient Name: Glenn Watson MRN: [id number] DOB:09/02/1945, 77 y.o., male Today's Date: 10/27/2022  PCP: Marguarite Arbour, MD REFERRING PROVIDER: Deeann Saint, MD  END OF SESSION:   PT End of Session - 10/27/22 1124     Visit Number 15    Number of Visits 24    Date for PT Re-Evaluation 11/30/22    Authorization Type [employer] reporting period from 10/08/2022    Progress Note Due on Visit 20    PT Start Time 1123    PT Stop Time 1202    PT Time Calculation (min) 39 min    Activity Tolerance Patient tolerated treatment well;Patient limited by pain;Patient limited by fatigue    Behavior During Therapy [facility] for tasks assessed/performed               Past Medical History:  Diagnosis Date   Anemia    Asthma    as a child, exercise asthma - none since high school"   Cancer [facility])    skin cancer - basal, squamous- Mylenoma - stage 2- chest   Complication of anesthesia    "very sensitive to medications" "Had colonscopy with just Draminine"   Family history of adverse reaction to anesthesia    daughter- N/V   Hyperlipidemia    Hypertension    PONV (postoperative nausea and vomiting)    Past Surgical History:  Procedure Laterality Date   APPENDECTOMY  2011   Dr. Lemar Livings   COLONOSCOPY WITH PROPOFOL N/A 02/10/2017   Procedure: COLONOSCOPY WITH PROPOFOL;  Surgeon: Kieth Brightly, MD;  Location: [facility] ENDOSCOPY;  Service: Endoscopy;  Laterality: N/A;   ORIF TIBIA PLATEAU Left 08/03/2019   Procedure: OPEN REDUCTION INTERNAL FIXATION (ORIF) TIBIAL PLATEAU;  Surgeon: Myrene Galas, MD;  Location: [facility] OR;  Service: Orthopedics;  Laterality: Left;   Patient Active Problem List   Diagnosis Date Noted   Prostate cancer (HCC) 03/04/2021   Hypertension    Hyperlipidemia    Asthma    Tibial plateau fracture, left, closed, with nonunion, subsequent encounter 08/03/2019   Closed fracture of shaft of tibia 03/28/2019    Retrocalcaneal bursitis, right 04/05/2018   Chronic anemia 11/14/2013   OA (osteoarthritis) 11/14/2013    REFERRING DIAG: 4-part fracture of surgical neck of left humerus  THERAPY DIAG:  Pain in left upper arm  Stiffness of left shoulder, not elsewhere classified  Left shoulder pain, unspecified chronicity  Muscle weakness (generalized)  Rationale for Evaluation and Treatment: Rehabilitation  PERTINENT HISTORY: Patient is a 77 y.o. male who presents to outpatient physical therapy with a referral for medical diagnosis 4-part fracture of surgical neck of left humerus. This patient's chief complaints consist of left upper arm and shoulder pain, shoulder stiffness, and L UE weakness/swelling 2/2 L humeral neck fracture on 07/24/2022 leading to the following functional deficits: difficulty completing any activities that require use of left UE, showering, washing dishes, dressing, hygiene, working out at [employer] for health, sleeping, housework, cooking, throwing balls, some things he does left handed. Relevant past medical history and comorbidities include prostate cancer (dx Fall 2021, 40 radiation treatments in summer 2022, now doing well followed 1x a year), HTN, hyperlipidemia, asthma (as a child), ORIF L tibial Plateau (08/03/2019), OA, appendectomy, skin cancer (removed).  Patient denies hx of stroke, seizures, lung problems, heart problems, diabetes, unexplained weight loss, unexplained changes in bowel or bladder problems, unexplained stumbling or dropping things, osteoporosis, and spinal surgery   PRECAUTIONS: fracture  07/24/2022  SUBJECTIVE:                                                                                                                                                                                      SUBJECTIVE STATEMENT:  Patient reports he did his HEP on Sunday and has been sore since then. He states he was pretty sore after last PT session for about 1 hour. He states he  has no pain at rest upon arrival. He states he was the first time he was able to use both hands to shampoo and dry his hair.    PAIN:  Are you having pain? NPRS 0/10 left shoulder/upper arm when stationary.   OBJECTIVE   TODAY'S TREATMENT:  Therapeutic exercise: to centralize symptoms and improve ROM, strength, muscular endurance, and activity tolerance required for successful completion of functional activities.  - seated L shoulder AAROM pulley, 1x10 flexion, 1x10 abduction, 1x10. - standing flexion wall slide, AAROM with help from R UE at times, 1x10 with 5 second hold.  - standing scaption wall slide, AAROM with help from R UE at times, 1x10 with 5 second hold.  - sidelying left shoulder abdution hold with perturbations, approx 60hz  4 directions at elbow with predictable pattern, 3x45 seconds. (AAROM to get to/from position, painful) - standing L shoulder ER stretch in doorway, assistance from PT to get arm to/from correct position and support hold. 3x 30 second holds.  - seated L shoulder ER at ~ 85 degrees scaption, 3x10 AROM - standing B shoulder flexion with isometric abduction against YTB loop, 3x10.   Pt required multimodal cuing for proper technique and to facilitate improved neuromuscular control, strength, range of motion, and functional ability resulting in improved performance and form.   PATIENT EDUCATION: Education details: Exercise purpose/form. Self management techniques.  Reviewed cancelation/no-show policy with patient and confirmed patient has correct phone number for clinic; patient verbalized understanding (10/01/22). Person educated: Patient Education method: Explanation, Demonstration, Tactile cues, Verbal cues, and Handouts Education comprehension: verbalized understanding, returned demonstration, and needs further education   HOME EXERCISE PROGRAM: Access Code: [email or web address] URL: [email or web address] Date: 10/14/2022 Prepared by: Norton Blizzard  Exercises - Supine Shoulder External Rotation in 45 Degrees Abduction AAROM with Dowel  - 3 x daily - 1 sets - 10 reps - 10 seconds hold - Seated Shoulder Flexion AAROM with Pulley Behind  - 3 x daily - 1 sets - 10-20 reps - 5-10 seconds hold - Seated Shoulder Abduction AAROM with Pulley Behind  - 3 x daily - 1 sets - 10-20 reps - 5-10 seconds hold - Seated Shoulder Internal Rotation AAROM with Pulley (Mirrored)  - 2  x daily - 10 reps - 10 seconds hold - Standing Isometric Shoulder Internal Rotation at Doorway  - 2 x daily - 1 sets - 10 reps - 5 seconds hold - Standing Anatomical Position with Scapular Retraction and Depression at Wall  - 2 x daily - 1 sets - 10 reps - 5 second hold hold - Supine Shoulder Flexion with Dowel  - 2 x daily - 2 sets - 10 reps - 5 seconds hold - Supine Shoulder Protraction with Dowel  - 2 x daily - 2 sets - 10 reps - Sidelying Shoulder Abduction Palm Forward  - 1 x daily - 1 sets - 10 reps - 5 circles each way hold - Sidelying Shoulder ER with Towel and Dumbbell  - 1 x daily - 2 sets - 10 reps - 5 seconds hold - Seated Shoulder External Rotation PROM on Table  - 1-3 x daily - 2-3 sets - 10 reps - 5-10 seconds hold - Scaption Wall Slide with Towel  - 1 x daily - 1-3 sets - 10 reps - 5 seconds hold - Standing Shoulder External Rotation with Resistance  - 1 x daily - 3 sets - 10 reps    ASSESSMENT:   CLINICAL IMPRESSION: Patient arrives reporting continued soreness with use of L UE but not at rest. Today's session continued working towards improved strength and ROM to improve functional use of L UE. He continues to put forth good effort in clinic and needs cuing and support from PT for exercise selection and form. He also needs physical assist at times for correct positioning due to deficits in strength and motor control. Patient appeared to tolerate exercise with appropriate discomfort. He is limited by pain at the lateral L shoulder and has popping and  catching in the [facility] joint especially when moving into abduction and ER. Patient would benefit from continued management of limiting condition by skilled physical therapist to address remaining impairments and functional limitations to work towards stated goals and return to PLOF or maximal functional independence.   From initial PT eval 09/07/2022: Patient is a 77 y.o. male referred to outpatient physical therapy with a medical diagnosis of 4-part fracture of surgical neck of left humerus who presents with signs and symptoms consistent with R shoulder pain, stiffness, and weakness, and L UE swelling 2/2 proximal humerus fracture on 07/24/2022 after FOOSH. Patient presents with significant swelling, pain, joint stiffness, guarding, motor control, muscle tension, muscle performance (strength/power/endurance), swelling, tissue integrity, and activity tolerance impairments that are limiting ability to complete any activities that require use of left UE, showering, washing dishes, dressing, hygiene, working out at [employer] for health, sleeping, housework, cooking, throwing balls, some things he does left handed without difficulty. He currently has significant limitations due to pain with movement overhead with empty end feel with flexion and abduction. Patient will benefit from skilled physical therapy intervention to address current body structure impairments and activity limitations to improve function and work towards goals set in current POC in order to return to prior level of function or maximal functional improvement.    OBJECTIVE IMPAIRMENTS: decreased activity tolerance, decreased coordination, decreased endurance, decreased knowledge of condition, decreased mobility, decreased ROM, decreased strength, hypomobility, increased edema, increased fascial restrictions, impaired perceived functional ability, increased muscle spasms, impaired flexibility, impaired UE functional use, improper body mechanics, and pain.     ACTIVITY LIMITATIONS: carrying, lifting, sleeping, bathing, dressing, reach over head, hygiene/grooming, and caring for others   PARTICIPATION LIMITATIONS: meal prep,  cleaning, interpersonal relationship, shopping, community activity, occupation, yard work, and   difficulty completing any activities that require use of left UE, showering, washing dishes, dressing, hygiene, working out at [employer] for health, sleeping, housework, cooking, throwing balls, some things he does left handed   PERSONAL FACTORS: Age, Past/current experiences, Time since onset of injury/illness/exacerbation, and 3+ comorbidities:   prostate cancer (dx Fall 2021, 40 radiation treatments in summer 2022, now doing well followed 1x a year), HTN, hyperlipidemia, asthma (as a child), ORIF L tibial Plateau (08/03/2019), OA, appendectomy, skin cancer (removed) are also affecting patient's functional outcome.    REHAB POTENTIAL: Good   CLINICAL DECISION MAKING: Stable/uncomplicated   EVALUATION COMPLEXITY: Low     GOALS: Goals reviewed with patient? No   SHORT TERM GOALS: Target date: 09/21/2022   Patient will be independent with initial home exercise program for self-management of symptoms. Baseline: Initial HEP provided at [facility] (09/07/22); Goal status: In-progress     LONG TERM GOALS: Target date: 11/30/2022   Patient will be independent with a long-term home exercise program for self-management of symptoms.  Baseline: Initial HEP provided at [facility] (09/07/22); excellent participation (10/08/2022);  Goal status: In-progress   2.  Patient will demonstrate improved FOTO to equal or greater than 67 by visit #12 to demonstrate improvement in overall condition and self-reported functional ability.  Baseline: 44 (09/07/22); 61 at visit #8 (10/01/2022);  Goal status: In-progress   3.  Patient will demonstrate L shoulder AROM equal or greater than 130 degrees flexion, 110 degrees abduction,  80 degrees ER at 90 degrees abduction, and  IR to L1 to improve his ability to dress, reach overhead, and throw balls.  Baseline: very limited  - see objective (09/07/22); continues to be limited but improving - see objective (10/08/2022);  Goal status: In-progress   4.  Patient will demonstrate L shoulder strength equal or greater than 4/5 MMT in flexion, abduction, ER, and IR to improve his ability to reach overhead, throw a ball, and dress. Baseline: testing deferred due to acuity of injury (09/07/22); resistive testing continues to be deferred but see AROM for strength (10/08/2022);  Goal status: In-progress   5.  Patient will complete community, work and/or recreational activities with equal or less than 75% limitation due to current condition.  Baseline: difficulty completing any activities that require use of left UE, showering, washing dishes, dressing, hygiene, working out at [employer] for health, sleeping, housework, cooking, throwing balls, some things he does left handed (09/07/22); has noted improvement with ability to use left UE for activities that do not require overhead motion or heavy force such as putting his shirttail in, holding low on the handle of the elliptical machine, reaching for the car door, washing dishes, flossing; still cannot shampoo hair with left hand (10/08/2022);  Goal status: In-progress   PLAN:   PT FREQUENCY: 1-2x/week   PT DURATION: 12 weeks   PLANNED INTERVENTIONS: Therapeutic exercises, Therapeutic activity, Neuromuscular re-education, Balance training, Gait training, Patient/Family education, Self Care, Joint mobilization, Dry Needling, Electrical stimulation, Spinal mobilization, Cryotherapy, Moist heat, Manual therapy, and Re-evaluation   PLAN FOR NEXT SESSION: Update HEP as appropriate, progressive PROM, AAROM, strengthening for posture, shoulder girdle, and UE. Manual therapy for pain control, improved ROM.    Cira Rue, PT, DPT 10/27/2022, 12:03 PM  [facility] [address] [place], [place] [address] P: [phone] I F: [phone]

## 2022-11-02 ENCOUNTER — Encounter: Payer: Self-pay | Admitting: Physical Therapy

## 2022-11-02 ENCOUNTER — Ambulatory Visit: Payer: PPO | Attending: Specialist | Admitting: Physical Therapy

## 2022-11-02 DIAGNOSIS — M79622 Pain in left upper arm: Secondary | ICD-10-CM | POA: Diagnosis not present

## 2022-11-02 DIAGNOSIS — M6281 Muscle weakness (generalized): Secondary | ICD-10-CM

## 2022-11-02 DIAGNOSIS — M25612 Stiffness of left shoulder, not elsewhere classified: Secondary | ICD-10-CM | POA: Diagnosis not present

## 2022-11-02 DIAGNOSIS — M25512 Pain in left shoulder: Secondary | ICD-10-CM | POA: Insufficient documentation

## 2022-11-02 DIAGNOSIS — S42242A 4-part fracture of surgical neck of left humerus, initial encounter for closed fracture: Secondary | ICD-10-CM | POA: Diagnosis not present

## 2022-11-02 NOTE — Therapy (Signed)
OUTPATIENT PHYSICAL THERAPY TREATMENT NOTE  Patient Name: Glenn Watson MRN: [id number] DOB:1945/12/20, 77 y.o., male Today's Date: 11/02/2022  PCP: Marguarite Arbour, MD REFERRING PROVIDER: Deeann Saint, MD  END OF SESSION:   PT End of Session - 11/02/22 0952     Visit Number 16    Number of Visits 24    Date for PT Re-Evaluation 11/30/22    Authorization Type [employer] reporting period from 10/08/2022    Progress Note Due on Visit 20    PT Start Time 0951    PT Stop Time 1035    PT Time Calculation (min) 44 min    Activity Tolerance Patient tolerated treatment well;Patient limited by pain;Patient limited by fatigue    Behavior During Therapy [facility] for tasks assessed/performed              Past Medical History:  Diagnosis Date   Anemia    Asthma    as a child, exercise asthma - none since high school"   Cancer [facility])    skin cancer - basal, squamous- Mylenoma - stage 2- chest   Complication of anesthesia    "very sensitive to medications" "Had colonscopy with just Draminine"   Family history of adverse reaction to anesthesia    daughter- N/V   Hyperlipidemia    Hypertension    PONV (postoperative nausea and vomiting)    Past Surgical History:  Procedure Laterality Date   APPENDECTOMY  2011   Dr. Lemar Livings   COLONOSCOPY WITH PROPOFOL N/A 02/10/2017   Procedure: COLONOSCOPY WITH PROPOFOL;  Surgeon: Kieth Brightly, MD;  Location: [facility] ENDOSCOPY;  Service: Endoscopy;  Laterality: N/A;   ORIF TIBIA PLATEAU Left 08/03/2019   Procedure: OPEN REDUCTION INTERNAL FIXATION (ORIF) TIBIAL PLATEAU;  Surgeon: Myrene Galas, MD;  Location: [facility] OR;  Service: Orthopedics;  Laterality: Left;   Patient Active Problem List   Diagnosis Date Noted   Prostate cancer (HCC) 03/04/2021   Hypertension    Hyperlipidemia    Asthma    Tibial plateau fracture, left, closed, with nonunion, subsequent encounter 08/03/2019   Closed fracture of shaft of tibia 03/28/2019    Retrocalcaneal bursitis, right 04/05/2018   Chronic anemia 11/14/2013   OA (osteoarthritis) 11/14/2013    REFERRING DIAG: 4-part fracture of surgical neck of left humerus  THERAPY DIAG:  Pain in left upper arm  Stiffness of left shoulder, not elsewhere classified  Left shoulder pain, unspecified chronicity  Muscle weakness (generalized)  Rationale for Evaluation and Treatment: Rehabilitation  PERTINENT HISTORY: Patient is a 77 y.o. male who presents to outpatient physical therapy with a referral for medical diagnosis 4-part fracture of surgical neck of left humerus. This patient's chief complaints consist of left upper arm and shoulder pain, shoulder stiffness, and L UE weakness/swelling 2/2 L humeral neck fracture on 07/24/2022 leading to the following functional deficits: difficulty completing any activities that require use of left UE, showering, washing dishes, dressing, hygiene, working out at [employer] for health, sleeping, housework, cooking, throwing balls, some things he does left handed. Relevant past medical history and comorbidities include prostate cancer (dx Fall 2021, 40 radiation treatments in summer 2022, now doing well followed 1x a year), HTN, hyperlipidemia, asthma (as a child), ORIF L tibial Plateau (08/03/2019), OA, appendectomy, skin cancer (removed).  Patient denies hx of stroke, seizures, lung problems, heart problems, diabetes, unexplained weight loss, unexplained changes in bowel or bladder problems, unexplained stumbling or dropping things, osteoporosis, and spinal surgery   PRECAUTIONS: fracture 07/24/2022  SUBJECTIVE:                                                                                                                                                                                      SUBJECTIVE STATEMENT:  Patient reports his left shoulder is doing well. He states it seems like he can do a little more every day. He states he feels the scaption exercise with  the yellow band.    PAIN:  Are you having pain? NPRS 0/10 left shoulder/upper arm.   OBJECTIVE   TODAY'S TREATMENT:  Therapeutic exercise: to centralize symptoms and improve ROM, strength, muscular endurance, and activity tolerance required for successful completion of functional activities.  - seated L shoulder AAROM pulley, 1x10 flexion, 1x10 abduction, 1x10 FIR 1x10.  - standing flexion wall slide, AAROM with help from R UE at times, 1x10 with 5 second hold.  - standing scaption wall slide, AAROM with help from R UE at times, 1x10 with 5 second hold.  - sidelying left shoulder abdution hold with perturbations, approx > 60hz  4 directions at elbow with unpredictable pattern, 4x45 seconds. (AAROM to get to/from position, painful) - supine left shoulder ER/IR perturbations in scaption with elbow propped on double towel roll under upper arm, 3x30-45 seconds.  - standing L shoulder ER stretch in doorway, assistance from PT to get arm to/from correct position and support hold. 3x 60 second holds.  - seated L shoulder ER at ~ 85 degrees scaption, 3x10 AROM  Pt required multimodal cuing for proper technique and to facilitate improved neuromuscular control, strength, range of motion, and functional ability resulting in improved performance and form.   PATIENT EDUCATION: Education details: Exercise purpose/form. Self management techniques.  Reviewed cancelation/no-show policy with patient and confirmed patient has correct phone number for clinic; patient verbalized understanding (10/01/22). Person educated: Patient Education method: Explanation, Demonstration, Tactile cues, Verbal cues, and Handouts Education comprehension: verbalized understanding, returned demonstration, and needs further education   HOME EXERCISE PROGRAM: Access Code: [email or web address] URL: [email or web address] Date: 10/14/2022 Prepared by: Norton Blizzard  Exercises - Supine Shoulder External Rotation in 45 Degrees  Abduction AAROM with Dowel  - 3 x daily - 1 sets - 10 reps - 10 seconds hold - Seated Shoulder Flexion AAROM with Pulley Behind  - 3 x daily - 1 sets - 10-20 reps - 5-10 seconds hold - Seated Shoulder Abduction AAROM with Pulley Behind  - 3 x daily - 1 sets - 10-20 reps - 5-10 seconds hold - Seated Shoulder Internal Rotation AAROM with Pulley (Mirrored)  - 2 x daily - 10 reps - 10 seconds hold - Standing Isometric Shoulder Internal Rotation at Doorway  -  2 x daily - 1 sets - 10 reps - 5 seconds hold - Standing Anatomical Position with Scapular Retraction and Depression at Wall  - 2 x daily - 1 sets - 10 reps - 5 second hold hold - Supine Shoulder Flexion with Dowel  - 2 x daily - 2 sets - 10 reps - 5 seconds hold - Supine Shoulder Protraction with Dowel  - 2 x daily - 2 sets - 10 reps - Sidelying Shoulder Abduction Palm Forward  - 1 x daily - 1 sets - 10 reps - 5 circles each way hold - Sidelying Shoulder ER with Towel and Dumbbell  - 1 x daily - 2 sets - 10 reps - 5 seconds hold - Seated Shoulder External Rotation PROM on Table  - 1-3 x daily - 2-3 sets - 10 reps - 5-10 seconds hold - Scaption Wall Slide with Towel  - 1 x daily - 1-3 sets - 10 reps - 5 seconds hold - Standing Shoulder External Rotation with Resistance  - 1 x daily - 3 sets - 10 reps    ASSESSMENT:   CLINICAL IMPRESSION: Patient arrives reporting continued progress in using his left UE for functional activities. Today's session focused on continued strengthening and stretching exercises to help restore function in his left UE. Patient continues to put forth excellent effort in the clinic and complete HEP. He continues to be limited by pain, weakness, and stiffness that is gradually improving. Patient would benefit from continued management of limiting condition by skilled physical therapist to address remaining impairments and functional limitations to work towards stated goals and return to PLOF or maximal functional  independence.  From initial PT eval 09/07/2022: Patient is a 77 y.o. male referred to outpatient physical therapy with a medical diagnosis of 4-part fracture of surgical neck of left humerus who presents with signs and symptoms consistent with R shoulder pain, stiffness, and weakness, and L UE swelling 2/2 proximal humerus fracture on 07/24/2022 after FOOSH. Patient presents with significant swelling, pain, joint stiffness, guarding, motor control, muscle tension, muscle performance (strength/power/endurance), swelling, tissue integrity, and activity tolerance impairments that are limiting ability to complete any activities that require use of left UE, showering, washing dishes, dressing, hygiene, working out at [employer] for health, sleeping, housework, cooking, throwing balls, some things he does left handed without difficulty. He currently has significant limitations due to pain with movement overhead with empty end feel with flexion and abduction. Patient will benefit from skilled physical therapy intervention to address current body structure impairments and activity limitations to improve function and work towards goals set in current POC in order to return to prior level of function or maximal functional improvement.    OBJECTIVE IMPAIRMENTS: decreased activity tolerance, decreased coordination, decreased endurance, decreased knowledge of condition, decreased mobility, decreased ROM, decreased strength, hypomobility, increased edema, increased fascial restrictions, impaired perceived functional ability, increased muscle spasms, impaired flexibility, impaired UE functional use, improper body mechanics, and pain.    ACTIVITY LIMITATIONS: carrying, lifting, sleeping, bathing, dressing, reach over head, hygiene/grooming, and caring for others   PARTICIPATION LIMITATIONS: meal prep, cleaning, interpersonal relationship, shopping, community activity, occupation, yard work, and   difficulty completing any  activities that require use of left UE, showering, washing dishes, dressing, hygiene, working out at [employer] for health, sleeping, housework, cooking, throwing balls, some things he does left handed   PERSONAL FACTORS: Age, Past/current experiences, Time since onset of injury/illness/exacerbation, and 3+ comorbidities:   prostate cancer (dx Fall  2021, 40 radiation treatments in summer 2022, now doing well followed 1x a year), HTN, hyperlipidemia, asthma (as a child), ORIF L tibial Plateau (08/03/2019), OA, appendectomy, skin cancer (removed) are also affecting patient's functional outcome.    REHAB POTENTIAL: Good   CLINICAL DECISION MAKING: Stable/uncomplicated   EVALUATION COMPLEXITY: Low     GOALS: Goals reviewed with patient? No   SHORT TERM GOALS: Target date: 09/21/2022   Patient will be independent with initial home exercise program for self-management of symptoms. Baseline: Initial HEP provided at IE (09/07/22); Goal status: In-progress     LONG TERM GOALS: Target date: 11/30/2022   Patient will be independent with a long-term home exercise program for self-management of symptoms.  Baseline: Initial HEP provided at IE (09/07/22); excellent participation (10/08/2022);  Goal status: In-progress   2.  Patient will demonstrate improved FOTO to equal or greater than 67 by visit #12 to demonstrate improvement in overall condition and self-reported functional ability.  Baseline: 44 (09/07/22); 61 at visit #8 (10/01/2022);  Goal status: In-progress   3.  Patient will demonstrate L shoulder AROM equal or greater than 130 degrees flexion, 110 degrees abduction,  80 degrees ER at 90 degrees abduction, and IR to L1 to improve his ability to dress, reach overhead, and throw balls.  Baseline: very limited  - see objective (09/07/22); continues to be limited but improving - see objective (10/08/2022);  Goal status: In-progress   4.  Patient will demonstrate L shoulder strength equal or greater  than 4/5 MMT in flexion, abduction, ER, and IR to improve his ability to reach overhead, throw a ball, and dress. Baseline: testing deferred due to acuity of injury (09/07/22); resistive testing continues to be deferred but see AROM for strength (10/08/2022);  Goal status: In-progress   5.  Patient will complete community, work and/or recreational activities with equal or less than 75% limitation due to current condition.  Baseline: difficulty completing any activities that require use of left UE, showering, washing dishes, dressing, hygiene, working out at [employer] for health, sleeping, housework, cooking, throwing balls, some things he does left handed (09/07/22); has noted improvement with ability to use left UE for activities that do not require overhead motion or heavy force such as putting his shirttail in, holding low on the handle of the elliptical machine, reaching for the car door, washing dishes, flossing; still cannot shampoo hair with left hand (10/08/2022);  Goal status: In-progress   PLAN:   PT FREQUENCY: 1-2x/week   PT DURATION: 12 weeks   PLANNED INTERVENTIONS: Therapeutic exercises, Therapeutic activity, Neuromuscular re-education, Balance training, Gait training, Patient/Family education, Self Care, Joint mobilization, Dry Needling, Electrical stimulation, Spinal mobilization, Cryotherapy, Moist heat, Manual therapy, and Re-evaluation   PLAN FOR NEXT SESSION: Update HEP as appropriate, progressive PROM, AAROM, strengthening for posture, shoulder girdle, and UE. Manual therapy for pain control, improved ROM.    Cira Rue, PT, DPT 11/02/2022, 10:48 AM  [facility] [address] [place], [place] [address] P: [phone] I F: [phone]

## 2022-11-05 ENCOUNTER — Ambulatory Visit: Payer: PPO | Admitting: Physical Therapy

## 2022-11-05 ENCOUNTER — Encounter: Payer: Self-pay | Admitting: Physical Therapy

## 2022-11-05 DIAGNOSIS — M25612 Stiffness of left shoulder, not elsewhere classified: Secondary | ICD-10-CM

## 2022-11-05 DIAGNOSIS — M25512 Pain in left shoulder: Secondary | ICD-10-CM

## 2022-11-05 DIAGNOSIS — M6281 Muscle weakness (generalized): Secondary | ICD-10-CM

## 2022-11-05 DIAGNOSIS — M79622 Pain in left upper arm: Secondary | ICD-10-CM

## 2022-11-05 NOTE — Therapy (Signed)
OUTPATIENT PHYSICAL THERAPY TREATMENT NOTE  Patient Name: Glenn Watson MRN: [id number] DOB:[birth year], 77 y.o., male Today's Date: 11/05/2022  PCP: Marguarite Arbour, MD REFERRING PROVIDER: Deeann Saint, MD  END OF SESSION:   PT End of Session - 11/05/22 0912     Visit Number 17    Number of Visits 24    Date for PT Re-Evaluation 11/30/22    Authorization Type [employer] reporting period from 10/08/2022    Progress Note Due on Visit 20    PT Start Time 0905    PT Stop Time 0943    PT Time Calculation (min) 38 min    Activity Tolerance Patient tolerated treatment well;Patient limited by pain;Patient limited by fatigue    Behavior During Therapy [facility] for tasks assessed/performed              Past Medical History:  Diagnosis Date   Anemia    Asthma    as a child, exercise asthma - none since high school"   Cancer [facility])    skin cancer - basal, squamous- Mylenoma - stage 2- chest   Complication of anesthesia    "very sensitive to medications" "Had colonscopy with just Draminine"   Family history of adverse reaction to anesthesia    daughter- N/V   Hyperlipidemia    Hypertension    PONV (postoperative nausea and vomiting)    Past Surgical History:  Procedure Laterality Date   APPENDECTOMY  2011   Dr. Lemar Livings   COLONOSCOPY WITH PROPOFOL N/A 02/10/2017   Procedure: COLONOSCOPY WITH PROPOFOL;  Surgeon: Kieth Brightly, MD;  Location: [facility] END[facility]COPY;  Service: Endoscopy;  Laterality: N/A;   ORIF TIBIA PLATEAU Left 08/03/2019   Procedure: OPEN REDUCTION INTERNAL FIXATION (ORIF) TIBIAL PLATEAU;  Surgeon: Myrene Galas, MD;  Location: [facility] OR;  Service: Orthopedics;  Laterality: Left;   Patient Active Problem List   Diagnosis Date Noted   Prostate cancer (HCC) 03/04/2021   Hypertension    Hyperlipidemia    Asthma    Tibial plateau fracture, left, closed, with nonunion, subsequent encounter 08/03/2019   Closed fracture of shaft of tibia 03/28/2019    Retrocalcaneal bursitis, right 04/05/2018   Chronic anemia 11/14/2013   OA (osteoarthritis) 11/14/2013    REFERRING DIAG: 4-part fracture of surgical neck of left humerus  THERAPY DIAG:  Pain in left upper arm  Stiffness of left shoulder, not elsewhere classified  Left shoulder pain, unspecified chronicity  Muscle weakness (generalized)  Rationale for Evaluation and Treatment: Rehabilitation  PERTINENT HISTORY: Patient is a 77 y.o. male who presents to outpatient physical therapy with a referral for medical diagnosis 4-part fracture of surgical neck of left humerus. This patient's chief complaints consist of left upper arm and shoulder pain, shoulder stiffness, and L UE weakness/swelling 2/2 L humeral neck fracture on 07/24/2022 leading to the following functional deficits: difficulty completing any activities that require use of left UE, showering, washing dishes, dressing, hygiene, working out at [employer] for health, sleeping, housework, cooking, throwing balls, some things he does left handed. Relevant past medical history and comorbidities include prostate cancer (dx Fall 2021, 40 radiation treatments in summer 2022, now doing well followed 1x a year), HTN, hyperlipidemia, asthma (as a child), ORIF L tibial Plateau (08/03/2019), OA, appendectomy, skin cancer (removed).  Patient denies hx of stroke, seizures, lung problems, heart problems, diabetes, unexplained weight loss, unexplained changes in bowel or bladder problems, unexplained stumbling or dropping things, osteoporosis, and spinal surgery   PRECAUTIONS: fracture 07/24/2022  SUBJECTIVE:                                                                                                                                                                                      SUBJECTIVE STATEMENT:  Patient reports he saw Dr. Hyacinth Meeker who was pleased with his progress. He brought in a picture of his xray and reports Dr. Hyacinth Meeker says there is a bridge  forming at the superior end of his humerus. He is sore from doing exercises yesterday but he does not consider it pain that would rate on the NPRS.    PAIN:  NPRS 0/10 left shoulder/upper arm.   OBJECTIVE    TODAY'S TREATMENT:  Therapeutic exercise: to centralize symptoms and improve ROM, strength, muscular endurance, and activity tolerance required for successful completion of functional activities.  - seated L shoulder AAROM pulley, 1x10 flexion, 1x10 abduction, 1x10 FIR 1x10. 1-5 second holds.  - standing flexion wall slide, AAROM with help from R UE at times, 1x10 with 5 second hold.  - standing scaption wall slide, AAROM with help from R UE at times, 1x10 with 5 second hold.  - sidelying left shoulder abduction hold with perturbations, all directions at elbow with unpredictable pattern, 5x45 seconds. (AAROM to get to/from position, painful) - standing L shoulder ER stretch in doorway, assistance from PT to get arm to/from correct position and support hold. 3x 60 second holds.   Pt required multimodal cuing for proper technique and to facilitate improved neuromuscular control, strength, range of motion, and functional ability resulting in improved performance and form.   PATIENT EDUCATION: Education details: Exercise purpose/form. Self management techniques.  Reviewed cancelation/no-show policy with patient and confirmed patient has correct phone number for clinic; patient verbalized understanding (10/01/22). Person educated: Patient Education method: Explanation, Demonstration, Tactile cues, Verbal cues, and Handouts Education comprehension: verbalized understanding, returned demonstration, and needs further education   HOME EXERCISE PROGRAM: Access Code: [email or web address] URL: [email or web address] Date: 10/14/2022 Prepared by: Norton Blizzard  Exercises - Supine Shoulder External Rotation in 45 Degrees Abduction AAROM with Dowel  - 3 x daily - 1 sets - 10 reps - 10 seconds  hold - Seated Shoulder Flexion AAROM with Pulley Behind  - 3 x daily - 1 sets - 10-20 reps - 5-10 seconds hold - Seated Shoulder Abduction AAROM with Pulley Behind  - 3 x daily - 1 sets - 10-20 reps - 5-10 seconds hold - Seated Shoulder Internal Rotation AAROM with Pulley (Mirrored)  - 2 x daily - 10 reps - 10 seconds hold - Standing Isometric Shoulder Internal Rotation at Doorway  - 2 x daily - 1 sets - 10 reps -  5 seconds hold - Standing Anatomical Position with Scapular Retraction and Depression at Wall  - 2 x daily - 1 sets - 10 reps - 5 second hold hold - Supine Shoulder Flexion with Dowel  - 2 x daily - 2 sets - 10 reps - 5 seconds hold - Supine Shoulder Protraction with Dowel  - 2 x daily - 2 sets - 10 reps - Sidelying Shoulder Abduction Palm Forward  - 1 x daily - 1 sets - 10 reps - 5 circles each way hold - Sidelying Shoulder ER with Towel and Dumbbell  - 1 x daily - 2 sets - 10 reps - 5 seconds hold - Seated Shoulder External Rotation PROM on Table  - 1-3 x daily - 2-3 sets - 10 reps - 5-10 seconds hold - Scaption Wall Slide with Towel  - 1 x daily - 1-3 sets - 10 reps - 5 seconds hold - Standing Shoulder External Rotation with Resistance  - 1 x daily - 3 sets - 10 reps    ASSESSMENT:   CLINICAL IMPRESSION: Patient arrives with report of good visit with referring physician. He continued to work on ROM and strengthening exercises as tolerated to improve function. Patient reports he needs the day off from shoulder exercises the day after being at PT. He continues to have pain and weakness that limits his active motion and requires PT assistance to achieve optimal positions for strengthening. Patient would benefit from continued management of limiting condition by skilled physical therapist to address remaining impairments and functional limitations to work towards stated goals and return to PLOF or maximal functional independence.  From initial PT eval 09/07/2022: Patient is a 77 y.o.  male referred to outpatient physical therapy with a medical diagnosis of 4-part fracture of surgical neck of left humerus who presents with signs and symptoms consistent with R shoulder pain, stiffness, and weakness, and L UE swelling 2/2 proximal humerus fracture on 07/24/2022 after FOOSH. Patient presents with significant swelling, pain, joint stiffness, guarding, motor control, muscle tension, muscle performance (strength/power/endurance), swelling, tissue integrity, and activity tolerance impairments that are limiting ability to complete any activities that require use of left UE, showering, washing dishes, dressing, hygiene, working out at [employer] for health, sleeping, housework, cooking, throwing balls, some things he does left handed without difficulty. He currently has significant limitations due to pain with movement overhead with empty end feel with flexion and abduction. Patient will benefit from skilled physical therapy intervention to address current body structure impairments and activity limitations to improve function and work towards goals set in current POC in order to return to prior level of function or maximal functional improvement.    OBJECTIVE IMPAIRMENTS: decreased activity tolerance, decreased coordination, decreased endurance, decreased knowledge of condition, decreased mobility, decreased ROM, decreased strength, hypomobility, increased edema, increased fascial restrictions, impaired perceived functional ability, increased muscle spasms, impaired flexibility, impaired UE functional use, improper body mechanics, and pain.    ACTIVITY LIMITATIONS: carrying, lifting, sleeping, bathing, dressing, reach over head, hygiene/grooming, and caring for others   PARTICIPATION LIMITATIONS: meal prep, cleaning, interpersonal relationship, shopping, community activity, occupation, yard work, and   difficulty completing any activities that require use of left UE, showering, washing dishes, dressing,  hygiene, working out at [employer] for health, sleeping, housework, cooking, throwing balls, some things he does left handed   PERSONAL FACTORS: Age, Past/current experiences, Time since onset of injury/illness/exacerbation, and 3+ comorbidities:   prostate cancer (dx Fall 2021, 40 radiation treatments in  summer 2022, now doing well followed 1x a year), HTN, hyperlipidemia, asthma (as a child), ORIF L tibial Plateau (08/03/2019), OA, appendectomy, skin cancer (removed) are also affecting patient's functional outcome.    REHAB POTENTIAL: Good   CLINICAL DECISION MAKING: Stable/uncomplicated   EVALUATION COMPLEXITY: Low     GOALS: Goals reviewed with patient? No   SHORT TERM GOALS: Target date: 09/21/2022   Patient will be independent with initial home exercise program for self-management of symptoms. Baseline: Initial HEP provided at IE (09/07/22); Goal status: In-progress     LONG TERM GOALS: Target date: 11/30/2022   Patient will be independent with a long-term home exercise program for self-management of symptoms.  Baseline: Initial HEP provided at IE (09/07/22); excellent participation (10/08/2022);  Goal status: In-progress   2.  Patient will demonstrate improved FOTO to equal or greater than 67 by visit #12 to demonstrate improvement in overall condition and self-reported functional ability.  Baseline: 44 (09/07/22); 61 at visit #8 (10/01/2022);  Goal status: In-progress   3.  Patient will demonstrate L shoulder AROM equal or greater than 130 degrees flexion, 110 degrees abduction,  80 degrees ER at 90 degrees abduction, and IR to L1 to improve his ability to dress, reach overhead, and throw balls.  Baseline: very limited  - see objective (09/07/22); continues to be limited but improving - see objective (10/08/2022);  Goal status: In-progress   4.  Patient will demonstrate L shoulder strength equal or greater than 4/5 MMT in flexion, abduction, ER, and IR to improve his ability to reach  overhead, throw a ball, and dress. Baseline: testing deferred due to acuity of injury (09/07/22); resistive testing continues to be deferred but see AROM for strength (10/08/2022);  Goal status: In-progress   5.  Patient will complete community, work and/or recreational activities with equal or less than 75% limitation due to current condition.  Baseline: difficulty completing any activities that require use of left UE, showering, washing dishes, dressing, hygiene, working out at [employer] for health, sleeping, housework, cooking, throwing balls, some things he does left handed (09/07/22); has noted improvement with ability to use left UE for activities that do not require overhead motion or heavy force such as putting his shirttail in, holding low on the handle of the elliptical machine, reaching for the car door, washing dishes, flossing; still cannot shampoo hair with left hand (10/08/2022);  Goal status: In-progress   PLAN:   PT FREQUENCY: 1-2x/week   PT DURATION: 12 weeks   PLANNED INTERVENTIONS: Therapeutic exercises, Therapeutic activity, Neuromuscular re-education, Balance training, Gait training, Patient/Family education, Self Care, Joint mobilization, Dry Needling, Electrical stimulation, Spinal mobilization, Cryotherapy, Moist heat, Manual therapy, and Re-evaluation   PLAN FOR NEXT SESSION: Update HEP as appropriate, progressive PROM, AAROM, strengthening for posture, shoulder girdle, and UE. Manual therapy for pain control, improved ROM.    Cira Rue, PT, DPT 11/05/2022, 9:45 AM  [facility] [address] [place], [place] [address] P: [phone] I F: [phone]

## 2022-11-10 ENCOUNTER — Ambulatory Visit: Payer: PPO | Admitting: Physical Therapy

## 2022-11-10 ENCOUNTER — Encounter: Payer: Self-pay | Admitting: Physical Therapy

## 2022-11-10 DIAGNOSIS — M25512 Pain in left shoulder: Secondary | ICD-10-CM

## 2022-11-10 DIAGNOSIS — M25612 Stiffness of left shoulder, not elsewhere classified: Secondary | ICD-10-CM

## 2022-11-10 DIAGNOSIS — M79622 Pain in left upper arm: Secondary | ICD-10-CM | POA: Diagnosis not present

## 2022-11-10 DIAGNOSIS — M6281 Muscle weakness (generalized): Secondary | ICD-10-CM

## 2022-11-10 NOTE — Therapy (Signed)
OUTPATIENT PHYSICAL THERAPY TREATMENT NOTE  Patient Name: Glenn Watson MRN: [id number] DOB:June 05, 1946, 77 y.o., male Today's Date: 11/10/2022  PCP: Marguarite Arbour, MD REFERRING PROVIDER: Deeann Saint, MD  END OF SESSION:   PT End of Session - 11/10/22 1300     Visit Number 18    Number of Visits 24    Date for PT Re-Evaluation 11/30/22    Authorization Type [employer] reporting period from 10/08/2022    Progress Note Due on Visit 20    PT Start Time 1301    PT Stop Time 1340    PT Time Calculation (min) 39 min    Activity Tolerance Patient tolerated treatment well;Patient limited by pain;Patient limited by fatigue    Behavior During Therapy [facility] [address] [address] for tasks assessed/performed              Past Medical History:  Diagnosis Date   Anemia    Asthma    as a child, exercise asthma - none since high school"   Cancer [facility])    skin cancer - basal, squamous- Mylenoma - stage 2- chest   Complication of anesthesia    "very sensitive to medications" "Had colonscopy with just Draminine"   Family history of adverse reaction to anesthesia    daughter- N/V   Hyperlipidemia    Hypertension    PONV (postoperative nausea and vomiting)    Past Surgical History:  Procedure Laterality Date   APPENDECTOMY  2011   Dr. Lemar Livings   COLONOSCOPY WITH PROPOFOL N/A 02/10/2017   Procedure: COLONOSCOPY WITH PROPOFOL;  Surgeon: Kieth Brightly, MD;  Location: [facility] ENDOSCOPY;  Service: Endoscopy;  Laterality: N/A;   ORIF TIBIA PLATEAU Left 08/03/2019   Procedure: OPEN REDUCTION INTERNAL FIXATION (ORIF) TIBIAL PLATEAU;  Surgeon: Myrene Galas, MD;  Location: [facility] OR;  Service: Orthopedics;  Laterality: Left;   Patient Active Problem List   Diagnosis Date Noted   Prostate cancer (HCC) 03/04/2021   Hypertension    Hyperlipidemia    Asthma    Tibial plateau fracture, left, closed, with nonunion, subsequent encounter 08/03/2019   Closed fracture of shaft of tibia 03/28/2019    Retrocalcaneal bursitis, right 04/05/2018   Chronic anemia 11/14/2013   OA (osteoarthritis) 11/14/2013    REFERRING DIAG: 4-part fracture of surgical neck of left humerus  THERAPY DIAG:  Pain in left upper arm  Stiffness of left shoulder, not elsewhere classified  Left shoulder pain, unspecified chronicity  Muscle weakness (generalized)  Rationale for Evaluation and Treatment: Rehabilitation  PERTINENT HISTORY: Patient is a 77 y.o. male who presents to outpatient physical therapy with a referral for medical diagnosis 4-part fracture of surgical neck of left humerus. This patient's chief complaints consist of left upper arm and shoulder pain, shoulder stiffness, and L UE weakness/swelling 2/2 L humeral neck fracture on 07/24/2022 leading to the following functional deficits: difficulty completing any activities that require use of left UE, showering, washing dishes, dressing, hygiene, working out at [employer] for health, sleeping, housework, cooking, throwing balls, some things he does left handed. Relevant past medical history and comorbidities include prostate cancer (dx Fall 2021, 40 radiation treatments in summer 2022, now doing well followed 1x a year), HTN, hyperlipidemia, asthma (as a child), ORIF L tibial Plateau (08/03/2019), OA, appendectomy, skin cancer (removed).  Patient denies hx of stroke, seizures, lung problems, heart problems, diabetes, unexplained weight loss, unexplained changes in bowel or bladder problems, unexplained stumbling or dropping things, osteoporosis, and spinal surgery   PRECAUTIONS: fracture 07/24/2022  SUBJECTIVE:                                                                                                                                                                                      SUBJECTIVE STATEMENT:  Patient reports he is doing well and he did his HEP on Saturday and did okay with it. He reports no pain currently. Patient reports he is able to reach  his toes with his L UE sometimes now when he reaches forwards after doing his leg lifts for his back. He could not do this before.    PAIN:  NPRS 0/10 left shoulder/upper arm at rest.   OBJECTIVE  TODAY'S TREATMENT:  Therapeutic exercise: to centralize symptoms and improve ROM, strength, muscular endurance, and activity tolerance required for successful completion of functional activities.  - seated L shoulder AAROM pulley, 1x10 flexion, 1x10 abduction, 1x10 FIR 1x10. 1-5 second holds.  Encouraged to do this daily.  - standing flexion wall slide, AAROM with help from R UE at times, 1x10 with 5 second hold. Cuing to use less assistance with eccentric portion of movement.  - standing scaption wall slide, AAROM with help from R UE at times, 1x10 with 5 second hold.  - standing L shoulder ER stretch in doorway, assistance from PT to get arm to/from correct position and support hold. 3x 60 second holds.  - sidelying left shoulder abduction hold with perturbations, all directions at wrist with unpredictable pattern, 3x60 seconds. (AAROM to get to/from position, painful) - supine chest press with plus, 1x3 with PVC stick (able to do without pain now).  - supine B shoulder flexion with PVC stick, with assistance from R UE to pull L shoulder further over his head. 1x10 with 5 second hold.    Pt required multimodal cuing for proper technique and to facilitate improved neuromuscular control, strength, range of motion, and functional ability resulting in improved performance and form.   PATIENT EDUCATION: Education details: Exercise purpose/form. Self management techniques.  Reviewed cancelation/no-show policy with patient and confirmed patient has correct phone number for clinic; patient verbalized understanding (10/01/22). Person educated: Patient Education method: Explanation, Demonstration, Tactile cues, Verbal cues, and Handouts Education comprehension: verbalized understanding, returned  demonstration, and needs further education   HOME EXERCISE PROGRAM: Access Code: [email or web address] URL: [email or web address] Date: 10/14/2022 Prepared by: Norton Blizzard  Exercises - Supine Shoulder External Rotation in 45 Degrees Abduction AAROM with Dowel  - 3 x daily - 1 sets - 10 reps - 10 seconds hold - Seated Shoulder Flexion AAROM with Pulley Behind  - 3 x daily - 1 sets - 10-20 reps - 5-10 seconds hold - Seated Shoulder Abduction  AAROM with Pulley Behind  - 3 x daily - 1 sets - 10-20 reps - 5-10 seconds hold - Seated Shoulder Internal Rotation AAROM with Pulley (Mirrored)  - 2 x daily - 10 reps - 10 seconds hold - Standing Isometric Shoulder Internal Rotation at Doorway  - 2 x daily - 1 sets - 10 reps - 5 seconds hold - Standing Anatomical Position with Scapular Retraction and Depression at Wall  - 2 x daily - 1 sets - 10 reps - 5 second hold hold - Supine Shoulder Flexion with Dowel  - 2 x daily - 2 sets - 10 reps - 5 seconds hold - Supine Shoulder Protraction with Dowel  - 2 x daily - 2 sets - 10 reps - Sidelying Shoulder Abduction Palm Forward  - 1 x daily - 1 sets - 10 reps - 5 circles each way hold - Sidelying Shoulder ER with Towel and Dumbbell  - 1 x daily - 2 sets - 10 reps - 5 seconds hold - Seated Shoulder External Rotation PROM on Table  - 1-3 x daily - 2-3 sets - 10 reps - 5-10 seconds hold - Scaption Wall Slide with Towel  - 1 x daily - 1-3 sets - 10 reps - 5 seconds hold - Standing Shoulder External Rotation with Resistance  - 1 x daily - 3 sets - 10 reps    ASSESSMENT:   CLINICAL IMPRESSION: Patient arrives reporting continued participation in HEP every other day or so. Today's session continued to focus on improving shoulder ROM and strength to improve function. Patient was able to complete supine AAROM L shoulder flexion with PVC stick and AROM to 90 degrees flexion. He continues to have end range pain and pain with active flexion and abduction against gravity.  Patient encouraged to perform his HEP more often. Patient would benefit from continued management of limiting condition by skilled physical therapist to address remaining impairments and functional limitations to work towards stated goals and return to PLOF or maximal functional independence.   From initial PT eval 09/07/2022: Patient is a 77 y.o. male referred to outpatient physical therapy with a medical diagnosis of 4-part fracture of surgical neck of left humerus who presents with signs and symptoms consistent with R shoulder pain, stiffness, and weakness, and L UE swelling 2/2 proximal humerus fracture on 07/24/2022 after FOOSH. Patient presents with significant swelling, pain, joint stiffness, guarding, motor control, muscle tension, muscle performance (strength/power/endurance), swelling, tissue integrity, and activity tolerance impairments that are limiting ability to complete any activities that require use of left UE, showering, washing dishes, dressing, hygiene, working out at [employer] for health, sleeping, housework, cooking, throwing balls, some things he does left handed without difficulty. He currently has significant limitations due to pain with movement overhead with empty end feel with flexion and abduction. Patient will benefit from skilled physical therapy intervention to address current body structure impairments and activity limitations to improve function and work towards goals set in current POC in order to return to prior level of function or maximal functional improvement.    OBJECTIVE IMPAIRMENTS: decreased activity tolerance, decreased coordination, decreased endurance, decreased knowledge of condition, decreased mobility, decreased ROM, decreased strength, hypomobility, increased edema, increased fascial restrictions, impaired perceived functional ability, increased muscle spasms, impaired flexibility, impaired UE functional use, improper body mechanics, and pain.    ACTIVITY  LIMITATIONS: carrying, lifting, sleeping, bathing, dressing, reach over head, hygiene/grooming, and caring for others   PARTICIPATION LIMITATIONS: meal prep, cleaning, interpersonal relationship,  shopping, community activity, occupation, yard work, and   difficulty completing any activities that require use of left UE, showering, washing dishes, dressing, hygiene, working out at [employer] for health, sleeping, housework, cooking, throwing balls, some things he does left handed   PERSONAL FACTORS: Age, Past/current experiences, Time since onset of injury/illness/exacerbation, and 3+ comorbidities:   prostate cancer (dx Fall 2021, 40 radiation treatments in summer 2022, now doing well followed 1x a year), HTN, hyperlipidemia, asthma (as a child), ORIF L tibial Plateau (08/03/2019), OA, appendectomy, skin cancer (removed) are also affecting patient's functional outcome.    REHAB POTENTIAL: Good   CLINICAL DECISION MAKING: Stable/uncomplicated   EVALUATION COMPLEXITY: Low     GOALS: Goals reviewed with patient? No   SHORT TERM GOALS: Target date: 09/21/2022   Patient will be independent with initial home exercise program for self-management of symptoms. Baseline: Initial HEP provided at IE (09/07/22); Goal status: In-progress     LONG TERM GOALS: Target date: 11/30/2022   Patient will be independent with a long-term home exercise program for self-management of symptoms.  Baseline: Initial HEP provided at IE (09/07/22); excellent participation (10/08/2022);  Goal status: In-progress   2.  Patient will demonstrate improved FOTO to equal or greater than 67 by visit #12 to demonstrate improvement in overall condition and self-reported functional ability.  Baseline: 44 (09/07/22); 61 at visit #8 (10/01/2022);  Goal status: In-progress   3.  Patient will demonstrate L shoulder AROM equal or greater than 130 degrees flexion, 110 degrees abduction,  80 degrees ER at 90 degrees abduction, and IR to L1 to  improve his ability to dress, reach overhead, and throw balls.  Baseline: very limited  - see objective (09/07/22); continues to be limited but improving - see objective (10/08/2022);  Goal status: In-progress   4.  Patient will demonstrate L shoulder strength equal or greater than 4/5 MMT in flexion, abduction, ER, and IR to improve his ability to reach overhead, throw a ball, and dress. Baseline: testing deferred due to acuity of injury (09/07/22); resistive testing continues to be deferred but see AROM for strength (10/08/2022);  Goal status: In-progress   5.  Patient will complete community, work and/or recreational activities with equal or less than 75% limitation due to current condition.  Baseline: difficulty completing any activities that require use of left UE, showering, washing dishes, dressing, hygiene, working out at [employer] for health, sleeping, housework, cooking, throwing balls, some things he does left handed (09/07/22); has noted improvement with ability to use left UE for activities that do not require overhead motion or heavy force such as putting his shirttail in, holding low on the handle of the elliptical machine, reaching for the car door, washing dishes, flossing; still cannot shampoo hair with left hand (10/08/2022);  Goal status: In-progress   PLAN:   PT FREQUENCY: 1-2x/week   PT DURATION: 12 weeks   PLANNED INTERVENTIONS: Therapeutic exercises, Therapeutic activity, Neuromuscular re-education, Balance training, Gait training, Patient/Family education, Self Care, Joint mobilization, Dry Needling, Electrical stimulation, Spinal mobilization, Cryotherapy, Moist heat, Manual therapy, and Re-evaluation   PLAN FOR NEXT SESSION: Update HEP as appropriate, progressive PROM, AAROM, strengthening for posture, shoulder girdle, and UE. Manual therapy for pain control, improved ROM.    Cira Rue, PT, DPT 11/10/2022, 1:44 PM  [facility] [address] [place], [place] [address] P: [phone] I F: [phone]

## 2022-11-12 ENCOUNTER — Encounter: Payer: Self-pay | Admitting: Physical Therapy

## 2022-11-12 ENCOUNTER — Ambulatory Visit: Payer: PPO | Admitting: Physical Therapy

## 2022-11-12 DIAGNOSIS — M6281 Muscle weakness (generalized): Secondary | ICD-10-CM

## 2022-11-12 DIAGNOSIS — M25512 Pain in left shoulder: Secondary | ICD-10-CM

## 2022-11-12 DIAGNOSIS — M79622 Pain in left upper arm: Secondary | ICD-10-CM

## 2022-11-12 DIAGNOSIS — M25612 Stiffness of left shoulder, not elsewhere classified: Secondary | ICD-10-CM

## 2022-11-12 NOTE — Therapy (Signed)
OUTPATIENT PHYSICAL THERAPY TREATMENT NOTE  Patient Name: Glenn Watson MRN: [id number] DOB:Mar 22, 1946, 77 y.o., male Today's Date: 11/12/2022  PCP: Marguarite Arbour, MD REFERRING PROVIDER: Deeann Saint, MD  END OF SESSION:   PT End of Session - 11/12/22 0909     Visit Number 19    Number of Visits 24    Date for PT Re-Evaluation 11/30/22    Authorization Type [employer] reporting period from 10/08/2022    Progress Note Due on Visit 20    PT Start Time 0907    PT Stop Time 0945    PT Time Calculation (min) 38 min    Activity Tolerance Patient tolerated treatment well;Patient limited by pain;Patient limited by fatigue    Behavior During Therapy [facility] for tasks assessed/performed               Past Medical History:  Diagnosis Date   Anemia    Asthma    as a child, exercise asthma - none since high school"   Cancer [facility])    skin cancer - basal, squamous- Mylenoma - stage 2- chest   Complication of anesthesia    "very sensitive to medications" "Had colonscopy with just Draminine"   Family history of adverse reaction to anesthesia    daughter- N/V   Hyperlipidemia    Hypertension    PONV (postoperative nausea and vomiting)    Past Surgical History:  Procedure Laterality Date   APPENDECTOMY  2011   Dr. Lemar Livings   COLONOSCOPY WITH PROPOFOL N/A 02/10/2017   Procedure: COLONOSCOPY WITH PROPOFOL;  Surgeon: Kieth Brightly, MD;  Location: [facility] ENDOSCOPY;  Service: Endoscopy;  Laterality: N/A;   ORIF TIBIA PLATEAU Left 08/03/2019   Procedure: OPEN REDUCTION INTERNAL FIXATION (ORIF) TIBIAL PLATEAU;  Surgeon: Myrene Galas, MD;  Location: [facility] OR;  Service: Orthopedics;  Laterality: Left;   Patient Active Problem List   Diagnosis Date Noted   Prostate cancer (HCC) 03/04/2021   Hypertension    Hyperlipidemia    Asthma    Tibial plateau fracture, left, closed, with nonunion, subsequent encounter 08/03/2019   Closed fracture of shaft of tibia 03/28/2019    Retrocalcaneal bursitis, right 04/05/2018   Chronic anemia 11/14/2013   OA (osteoarthritis) 11/14/2013    REFERRING DIAG: 4-part fracture of surgical neck of left humerus  THERAPY DIAG:  Pain in left upper arm  Stiffness of left shoulder, not elsewhere classified  Left shoulder pain, unspecified chronicity  Muscle weakness (generalized)  Rationale for Evaluation and Treatment: Rehabilitation  PERTINENT HISTORY: Patient is a 77 y.o. male who presents to outpatient physical therapy with a referral for medical diagnosis 4-part fracture of surgical neck of left humerus. This patient's chief complaints consist of left upper arm and shoulder pain, shoulder stiffness, and L UE weakness/swelling 2/2 L humeral neck fracture on 07/24/2022 leading to the following functional deficits: difficulty completing any activities that require use of left UE, showering, washing dishes, dressing, hygiene, working out at [employer] for health, sleeping, housework, cooking, throwing balls, some things he does left handed. Relevant past medical history and comorbidities include prostate cancer (dx Fall 2021, 40 radiation treatments in summer 2022, now doing well followed 1x a year), HTN, hyperlipidemia, asthma (as a child), ORIF L tibial Plateau (08/03/2019), OA, appendectomy, skin cancer (removed).  Patient denies hx of stroke, seizures, lung problems, heart problems, diabetes, unexplained weight loss, unexplained changes in bowel or bladder problems, unexplained stumbling or dropping things, osteoporosis, and spinal surgery   PRECAUTIONS: fracture  07/24/2022  SUBJECTIVE:                                                                                                                                                                                      SUBJECTIVE STATEMENT:  Patient reports he continues to do well with his HEP and pain levels.    PAIN:  NPRS 0/10 left shoulder/upper arm at rest.   OBJECTIVE  TODAY'S  TREATMENT:  Therapeutic exercise: to centralize symptoms and improve ROM, strength, muscular endurance, and activity tolerance required for successful completion of functional activities.  - hooklying AAROM L shoulder flexion with PVC stick, 1x10 with 5 second holds.  - supine B  shoulder flexion with PVC stick loaded with 5#AW, 1x10 with 5 second holds. L shoulder flexion up to 142 degrees.  - supine chest press with plus, 3x10 with 3#DB.  - sidelying left shoulder AROM circumduction at 90 degrees abduction, circles as wide as tolerated with SBA for safety. 2x10.  - Education on HEP verbally    Manual therapy: to reduce pain and tissue tension, improve range of motion, neuromodulation, in order to promote improved ability to complete functional activities. SIDELYING - L shoulder PROM abduction with inferior glide of GHJ, 1x10 with 10 second holds to improve abduction ROM.   Pt required multimodal cuing for proper technique and to facilitate improved neuromuscular control, strength, range of motion, and functional ability resulting in improved performance and form.   PATIENT EDUCATION: Education details: Exercise purpose/form. Self management techniques.  Reviewed cancelation/no-show policy with patient and confirmed patient has correct phone number for clinic; patient verbalized understanding (10/01/22). Person educated: Patient Education method: Explanation, Demonstration, Tactile cues, Verbal cues, and Handouts Education comprehension: verbalized understanding, returned demonstration, and needs further education   HOME EXERCISE PROGRAM: Access Code: [email or web address] URL: [email or web address] Date: 10/14/2022 Prepared by: Norton Blizzard  Exercises - Supine Shoulder External Rotation in 45 Degrees Abduction AAROM with Dowel  - 3 x daily - 1 sets - 10 reps - 10 seconds hold - Seated Shoulder Flexion AAROM with Pulley Behind  - 3 x daily - 1 sets - 10-20 reps - 5-10 seconds hold -  Seated Shoulder Abduction AAROM with Pulley Behind  - 3 x daily - 1 sets - 10-20 reps - 5-10 seconds hold - Seated Shoulder Internal Rotation AAROM with Pulley (Mirrored)  - 2 x daily - 10 reps - 10 seconds hold - Standing Isometric Shoulder Internal Rotation at Doorway  - 2 x daily - 1 sets - 10 reps - 5 seconds hold - Standing Anatomical Position with Scapular Retraction and Depression at Wall  - 2 x daily - 1 sets -  10 reps - 5 second hold hold - Supine Shoulder Flexion with Dowel  - 2 x daily - 2 sets - 10 reps - 5 seconds hold - Supine Shoulder Protraction with Dowel  - 2 x daily - 2 sets - 10 reps - Sidelying Shoulder Abduction Palm Forward  - 1 x daily - 1 sets - 10 reps - 5 circles each way hold - Sidelying Shoulder ER with Towel and Dumbbell  - 1 x daily - 2 sets - 10 reps - 5 seconds hold - Seated Shoulder External Rotation PROM on Table  - 1-3 x daily - 2-3 sets - 10 reps - 5-10 seconds hold - Scaption Wall Slide with Towel  - 1 x daily - 1-3 sets - 10 reps - 5 seconds hold - Standing Shoulder External Rotation with Resistance  - 1 x daily - 3 sets - 10 reps    ASSESSMENT:   CLINICAL IMPRESSION: Patient arrives reporting continued good response to last PT session and HEP. Today's session focused on increasing load slightly as tolerated and adding some joint mobilizations with manual stretching to improve shoulder abduction and flexion ROM and strength. Patient tolerated treatment well and would benefit from continued intervention building on these progressions. HE continues to require assistance to move his L shoulder to 90 degrees abduction in sidelying, and he has lateral L shoulder pain after helping lower it down with AAROM. Patient would benefit from continued management of limiting condition by skilled physical therapist to address remaining impairments and functional limitations to work towards stated goals and return to PLOF or maximal functional independence.    From initial PT  eval 09/07/2022: Patient is a 77 y.o. male referred to outpatient physical therapy with a medical diagnosis of 4-part fracture of surgical neck of left humerus who presents with signs and symptoms consistent with R shoulder pain, stiffness, and weakness, and L UE swelling 2/2 proximal humerus fracture on 07/24/2022 after FOOSH. Patient presents with significant swelling, pain, joint stiffness, guarding, motor control, muscle tension, muscle performance (strength/power/endurance), swelling, tissue integrity, and activity tolerance impairments that are limiting ability to complete any activities that require use of left UE, showering, washing dishes, dressing, hygiene, working out at [employer] for health, sleeping, housework, cooking, throwing balls, some things he does left handed without difficulty. He currently has significant limitations due to pain with movement overhead with empty end feel with flexion and abduction. Patient will benefit from skilled physical therapy intervention to address current body structure impairments and activity limitations to improve function and work towards goals set in current POC in order to return to prior level of function or maximal functional improvement.    OBJECTIVE IMPAIRMENTS: decreased activity tolerance, decreased coordination, decreased endurance, decreased knowledge of condition, decreased mobility, decreased ROM, decreased strength, hypomobility, increased edema, increased fascial restrictions, impaired perceived functional ability, increased muscle spasms, impaired flexibility, impaired UE functional use, improper body mechanics, and pain.    ACTIVITY LIMITATIONS: carrying, lifting, sleeping, bathing, dressing, reach over head, hygiene/grooming, and caring for others   PARTICIPATION LIMITATIONS: meal prep, cleaning, interpersonal relationship, shopping, community activity, occupation, yard work, and   difficulty completing any activities that require use of left  UE, showering, washing dishes, dressing, hygiene, working out at [employer] for health, sleeping, housework, cooking, throwing balls, some things he does left handed   PERSONAL FACTORS: Age, Past/current experiences, Time since onset of injury/illness/exacerbation, and 3+ comorbidities:   prostate cancer (dx Fall 2021, 40 radiation treatments in summer  2022, now doing well followed 1x a year), HTN, hyperlipidemia, asthma (as a child), ORIF L tibial Plateau (08/03/2019), OA, appendectomy, skin cancer (removed) are also affecting patient's functional outcome.    REHAB POTENTIAL: Good   CLINICAL DECISION MAKING: Stable/uncomplicated   EVALUATION COMPLEXITY: Low     GOALS: Goals reviewed with patient? No   SHORT TERM GOALS: Target date: 09/21/2022   Patient will be independent with initial home exercise program for self-management of symptoms. Baseline: Initial HEP provided at IE (09/07/22); Goal status: In-progress     LONG TERM GOALS: Target date: 11/30/2022   Patient will be independent with a long-term home exercise program for self-management of symptoms.  Baseline: Initial HEP provided at [facility] (09/07/22); excellent participation (10/08/2022);  Goal status: In-progress   2.  Patient will demonstrate improved FOTO to equal or greater than 67 by visit #12 to demonstrate improvement in overall condition and self-reported functional ability.  Baseline: 44 (09/07/22); 61 at visit #8 (10/01/2022);  Goal status: In-progress   3.  Patient will demonstrate L shoulder AROM equal or greater than 130 degrees flexion, 110 degrees abduction,  80 degrees ER at 90 degrees abduction, and IR to L1 to improve his ability to dress, reach overhead, and throw balls.  Baseline: very limited  - see objective (09/07/22); continues to be limited but improving - see objective (10/08/2022);  Goal status: In-progress   4.  Patient will demonstrate L shoulder strength equal or greater than 4/5 MMT in flexion, abduction, ER,  and IR to improve his ability to reach overhead, throw a ball, and dress. Baseline: testing deferred due to acuity of injury (09/07/22); resistive testing continues to be deferred but see AROM for strength (10/08/2022);  Goal status: In-progress   5.  Patient will complete community, work and/or recreational activities with equal or less than 75% limitation due to current condition.  Baseline: difficulty completing any activities that require use of left UE, showering, washing dishes, dressing, hygiene, working out at [employer] for health, sleeping, housework, cooking, throwing balls, some things he does left handed (09/07/22); has noted improvement with ability to use left UE for activities that do not require overhead motion or heavy force such as putting his shirttail in, holding low on the handle of the elliptical machine, reaching for the car door, washing dishes, flossing; still cannot shampoo hair with left hand (10/08/2022);  Goal status: In-progress   PLAN:   PT FREQUENCY: 1-2x/week   PT DURATION: 12 weeks   PLANNED INTERVENTIONS: Therapeutic exercises, Therapeutic activity, Neuromuscular re-education, Balance training, Gait training, Patient/Family education, Self Care, Joint mobilization, Dry Needling, Electrical stimulation, Spinal mobilization, Cryotherapy, Moist heat, Manual therapy, and Re-evaluation   PLAN FOR NEXT SESSION: Update HEP as appropriate, progressive PROM, AAROM, strengthening for posture, shoulder girdle, and UE. Manual therapy for pain control, improved ROM.    Cira Rue, PT, DPT 11/12/2022, 4:37 PM  [facility] [address] [place], [place] [address] P: [phone] I F: [phone]

## 2022-11-16 ENCOUNTER — Encounter: Payer: Self-pay | Admitting: Physical Therapy

## 2022-11-16 ENCOUNTER — Ambulatory Visit: Payer: PPO | Admitting: Physical Therapy

## 2022-11-16 DIAGNOSIS — M25612 Stiffness of left shoulder, not elsewhere classified: Secondary | ICD-10-CM

## 2022-11-16 DIAGNOSIS — M79622 Pain in left upper arm: Secondary | ICD-10-CM

## 2022-11-16 DIAGNOSIS — M25512 Pain in left shoulder: Secondary | ICD-10-CM

## 2022-11-16 DIAGNOSIS — M6281 Muscle weakness (generalized): Secondary | ICD-10-CM

## 2022-11-16 NOTE — Therapy (Signed)
OUTPATIENT PHYSICAL THERAPY TREATMENT NOTE / PROGRESS NOTE / RE-CERTIFICATION Dates of reporting from 10/08/2022 to 11/16/2022  Patient Name: Glenn Watson MRN: [id number] DOB:October 10, 1945, 77 y.o., male Today's Date: 11/16/2022  PCP: Marguarite Arbour, MD REFERRING PROVIDER: Deeann Saint, MD  END OF SESSION:   PT End of Session - 11/16/22 1740     Visit Number 20    Number of Visits 63    Date for PT Re-Evaluation 02/08/23    Authorization Type [employer] reporting period from 10/08/2022    Progress Note Due on Visit 20    PT Start Time 1606    PT Stop Time 1652    PT Time Calculation (min) 46 min    Activity Tolerance Patient tolerated treatment well;Patient limited by pain;Patient limited by fatigue    Behavior During Therapy [facility] for tasks assessed/performed                Past Medical History:  Diagnosis Date   Anemia    Asthma    as a child, exercise asthma - none since high school"   Cancer [facility])    skin cancer - basal, squamous- Mylenoma - stage 2- chest   Complication of anesthesia    "very sensitive to medications" "Had colonscopy with just Draminine"   Family history of adverse reaction to anesthesia    daughter- N/V   Hyperlipidemia    Hypertension    PONV (postoperative nausea and vomiting)    Past Surgical History:  Procedure Laterality Date   APPENDECTOMY  2011   Dr. Lemar Livings   COLONOSCOPY WITH PROPOFOL N/A 02/10/2017   Procedure: COLONOSCOPY WITH PROPOFOL;  Surgeon: Kieth Brightly, MD;  Location: [facility] ENDOSCOPY;  Service: Endoscopy;  Laterality: N/A;   ORIF TIBIA PLATEAU Left 08/03/2019   Procedure: OPEN REDUCTION INTERNAL FIXATION (ORIF) TIBIAL PLATEAU;  Surgeon: Myrene Galas, MD;  Location: [facility] OR;  Service: Orthopedics;  Laterality: Left;   Patient Active Problem List   Diagnosis Date Noted   Prostate cancer (HCC) 03/04/2021   Hypertension    Hyperlipidemia    Asthma    Tibial plateau fracture, left, closed, with nonunion,  subsequent encounter 08/03/2019   Closed fracture of shaft of tibia 03/28/2019   Retrocalcaneal bursitis, right 04/05/2018   Chronic anemia 11/14/2013   OA (osteoarthritis) 11/14/2013    REFERRING DIAG: 4-part fracture of surgical neck of left humerus  THERAPY DIAG:  Pain in left upper arm  Stiffness of left shoulder, not elsewhere classified  Left shoulder pain, unspecified chronicity  Muscle weakness (generalized)  Rationale for Evaluation and Treatment: Rehabilitation  PERTINENT HISTORY: Patient is a 77 y.o. male who presents to outpatient physical therapy with a referral for medical diagnosis 4-part fracture of surgical neck of left humerus. This patient's chief complaints consist of left upper arm and shoulder pain, shoulder stiffness, and L UE weakness/swelling 2/2 L humeral neck fracture on 07/24/2022 leading to the following functional deficits: difficulty completing any activities that require use of left UE, showering, washing dishes, dressing, hygiene, working out at [employer] for health, sleeping, housework, cooking, throwing balls, some things he does left handed. Relevant past medical history and comorbidities include prostate cancer (dx Fall 2021, 40 radiation treatments in summer 2022, now doing well followed 1x a year), HTN, hyperlipidemia, asthma (as a child), ORIF L tibial Plateau (08/03/2019), OA, appendectomy, skin cancer (removed).  Patient denies hx of stroke, seizures, lung problems, heart problems, diabetes, unexplained weight loss, unexplained changes in bowel or bladder problems,  unexplained stumbling or dropping things, osteoporosis, and spinal surgery   PRECAUTIONS: fracture 07/24/2022  SUBJECTIVE:                                                                                                                                                                                      SUBJECTIVE STATEMENT:  Patient states he was out of town helping his son over the weekend  that was a lot of activity and he did not do his HEP during that time. He did it last night and was pretty sore afterwards. He has minimal soreness today. He states he felt okay after last PT session.    PAIN:  NPRS 1/10 left shoulder/upper arm  OBJECTIVE   SELF-REPORTED FUNCTION FOTO score: 60/100 (upper arm questionnaire)   PERIPHERAL JOINT MOTION (in degrees)   AAROM L shoulder  Flexion: 143  ACTIVE RANGE OF MOTION (AROM) *Indicates pain 09/07/22 10/08/22 11/16/22  Joint/Motion R/L R/L R/L  Shoulder        Flexion 142/50* /70 /73  Extension 72/40* /48* /45  Abduction  155/55* /65* /69*  External rotation 60/0 /0 /20  Internal rotation T8/Sacrum* /T12 /T12  Elbow        Flexion  134/130 /134 /140  Extension  -6/-8 /-4 /-4  Comments:  09/07/2022: pulling/discomfort pain at left painful movements. Abduction most painful. Compensatory L shoulder hike with ABD>FLEX.  10/08/2022; 11/16/2022: abduction most painful   PASSIVE RANGE OF MOTION (PROM) *Indicates pain 09/07/22 10/08/22 11/16/22  Joint/Motion R/L R/L R/L  Shoulder        Flexion /108* /130* /143*  Extension / / /  Abduction  /68* /110* /130*  External rotation /28* /18* /45*  Internal rotation /60* /82* /55*  Comments:  09/07/2022: flexion and abduction limited by pain/empty end feel with mild guarding. L shoulder ER/IR at 45 degrees scaption.  10/08/2022: L shoulder ER/IR at 90 degrees abduction 11/16/2022: ER/IR at 45 degrees scaption. IR isolated to [facility] joint. Flexion oin scaption plane sidelying with inferior glide of GHJ.    MUSCLE PERFORMANCE (MMT):  *Indicates pain 11/16/22 Date Date  Joint/Motion R/L R/L R/L  Shoulder     Flexion 5/3+* / /  Abduction (C5) 5/4 / /  External rotation 4+/2 / /  Internal rotation 5/4 / /  Extension / / /  Elbow     Flexion (C6) 5/4 / /  Extension (C7) 5/4 / /  11/16/2022: within available ROM (see AROM above).    Grip strength (in pounds, average of three measures).   09/07/2022:  R: (59+59+65)/3 = 61 L: (34+29+30)/ 3 = 31 10/08/2022:  R: (65+73+65)/3 = 67.7 L: (50+46+46)/  3 = 47.3 10/08/2022:  R: (67+74+68)/3 = 69.7 L: (54+59+68)/3 = 60.3  TODAY'S TREATMENT:  Therapeutic exercise: to centralize symptoms and improve ROM, strength, muscular endurance, and activity tolerance required for successful completion of functional activities.  - supine B  shoulder flexion with PVC stick loaded with 5#AW, 2x10 with 5 second holds. L shoulder flexion up to 142 degrees.  - supine chest press with plus, 3x10 with 3/4/4#DB.  - measurements to assess progress (see above).  - standing AAROM L shoulder wall slide, 1x10 each flexion and scaption. (Continues to require assistance from R UE for eccentric phase).  - Education on HEP verbally   Manual therapy: to reduce pain and tissue tension, improve range of motion, neuromodulation, in order to promote improved ability to complete functional activities. SIDELYING - L shoulder PROM abduction with inferior glide of GHJ, 1x10 with 10 second holds to improve abduction ROM.   Pt required multimodal cuing for proper technique and to facilitate improved neuromuscular control, strength, range of motion, and functional ability resulting in improved performance and form.   PATIENT EDUCATION: Education details: Exercise purpose/form. Self management techniques.  Reviewed cancelation/no-show policy with patient and confirmed patient has correct phone number for clinic; patient verbalized understanding (10/01/22). Person educated: Patient Education method: Explanation, Demonstration, Tactile cues, Verbal cues, and Handouts Education comprehension: verbalized understanding, returned demonstration, and needs further education   HOME EXERCISE PROGRAM: Access Code: [email or web address] URL: [email or web address] Date: 10/14/2022 Prepared by: Norton Blizzard  Exercises - Supine Shoulder External Rotation in 45 Degrees Abduction  AAROM with Dowel  - 3 x daily - 1 sets - 10 reps - 10 seconds hold - Seated Shoulder Flexion AAROM with Pulley Behind  - 3 x daily - 1 sets - 10-20 reps - 5-10 seconds hold - Seated Shoulder Abduction AAROM with Pulley Behind  - 3 x daily - 1 sets - 10-20 reps - 5-10 seconds hold - Seated Shoulder Internal Rotation AAROM with Pulley (Mirrored)  - 2 x daily - 10 reps - 10 seconds hold - Standing Isometric Shoulder Internal Rotation at Doorway  - 2 x daily - 1 sets - 10 reps - 5 seconds hold - Standing Anatomical Position with Scapular Retraction and Depression at Wall  - 2 x daily - 1 sets - 10 reps - 5 second hold hold - Supine Shoulder Flexion with Dowel  - 2 x daily - 2 sets - 10 reps - 5 seconds hold - Supine Shoulder Protraction with Dowel  - 2 x daily - 2 sets - 10 reps - Sidelying Shoulder Abduction Palm Forward  - 1 x daily - 1 sets - 10 reps - 5 circles each way hold - Sidelying Shoulder ER with Towel and Dumbbell  - 1 x daily - 2 sets - 10 reps - 5 seconds hold - Seated Shoulder External Rotation PROM on Table  - 1-3 x daily - 2-3 sets - 10 reps - 5-10 seconds hold - Scaption Wall Slide with Towel  - 1 x daily - 1-3 sets - 10 reps - 5 seconds hold - Standing Shoulder External Rotation with Resistance  - 1 x daily - 3 sets - 10 reps    ASSESSMENT:   CLINICAL IMPRESSION: Patient has attended 20 physical therapy sessions since starting this episode of care on 09/07/2022. Patient is making progress in pain, function, and exercise tolerance. He is demonstrating improved ability to tolerate manual therapy and starting resistive exercises with less pain. He is showing patterns of weakness  suggestive of possible rotator cuff tear including poor active shoulder ER and abduction with significant scapular compensation. Plan to continue working on ROM, motor control, and strengthening as tolerated and appropriate for tissue healing. Patient would benefit from continued management of limiting condition  by skilled physical therapist to address remaining impairments and functional limitations to work towards stated goals and return to PLOF or maximal functional independence.   From initial PT eval 09/07/2022: Patient is a 77 y.o. male referred to outpatient physical therapy with a medical diagnosis of 4-part fracture of surgical neck of left humerus who presents with signs and symptoms consistent with R shoulder pain, stiffness, and weakness, and L UE swelling 2/2 proximal humerus fracture on 07/24/2022 after FOOSH. Patient presents with significant swelling, pain, joint stiffness, guarding, motor control, muscle tension, muscle performance (strength/power/endurance), swelling, tissue integrity, and activity tolerance impairments that are limiting ability to complete any activities that require use of left UE, showering, washing dishes, dressing, hygiene, working out at [employer] for health, sleeping, housework, cooking, throwing balls, some things he does left handed without difficulty. He currently has significant limitations due to pain with movement overhead with empty end feel with flexion and abduction. Patient will benefit from skilled physical therapy intervention to address current body structure impairments and activity limitations to improve function and work towards goals set in current POC in order to return to prior level of function or maximal functional improvement.    OBJECTIVE IMPAIRMENTS: decreased activity tolerance, decreased coordination, decreased endurance, decreased knowledge of condition, decreased mobility, decreased ROM, decreased strength, hypomobility, increased edema, increased fascial restrictions, impaired perceived functional ability, increased muscle spasms, impaired flexibility, impaired UE functional use, improper body mechanics, and pain.    ACTIVITY LIMITATIONS: carrying, lifting, sleeping, bathing, dressing, reach over head, hygiene/grooming, and caring for others    PARTICIPATION LIMITATIONS: meal prep, cleaning, interpersonal relationship, shopping, community activity, occupation, yard work, and   difficulty completing any activities that require use of left UE, showering, washing dishes, dressing, hygiene, working out at [employer] for health, sleeping, housework, cooking, throwing balls, some things he does left handed   PERSONAL FACTORS: Age, Past/current experiences, Time since onset of injury/illness/exacerbation, and 3+ comorbidities:   prostate cancer (dx Fall 2021, 40 radiation treatments in summer 2022, now doing well followed 1x a year), HTN, hyperlipidemia, asthma (as a child), ORIF L tibial Plateau (08/03/2019), OA, appendectomy, skin cancer (removed) are also affecting patient's functional outcome.    REHAB POTENTIAL: Good   CLINICAL DECISION MAKING: Stable/uncomplicated   EVALUATION COMPLEXITY: Low     GOALS: Goals reviewed with patient? No   SHORT TERM GOALS: Target date: 09/21/2022   Patient will be independent with initial home exercise program for self-management of symptoms. Baseline: Initial HEP provided at IE (09/07/22); Goal status: MET     LONG TERM GOALS: Target date: 11/30/2022  Updated to 02/08/2023 for all unmet goals on 11/16/2022.    Patient will be independent with a long-term home exercise program for self-management of symptoms.  Baseline: Initial HEP provided at IE (09/07/22); excellent participation (10/08/2022); continues participation as able (11/16/2022);  Goal status: In-progress   2.  Patient will demonstrate improved FOTO to equal or greater than 67 by visit #12 to demonstrate improvement in overall condition and self-reported functional ability.  Baseline: 44 (09/07/22); 61 at visit #8 (10/01/2022); 60 at visit #20 (11/16/2022);  Goal status: In-progress   3.  Patient will demonstrate L shoulder AROM equal or greater than 130 degrees flexion,  110 degrees abduction,  80 degrees ER at 90 degrees abduction, and IR to L1  to improve his ability to dress, reach overhead, and throw balls.  Baseline: very limited  - see objective (09/07/22); continues to be limited but improving - see objective (10/08/2022);  similar limitations but less painful  - see objective (11/16/2022);  Goal status: In-progress   4.  Patient will demonstrate L shoulder strength equal or greater than 4/5 MMT in flexion, abduction, ER, and IR to improve his ability to reach overhead, throw a ball, and dress. Baseline: testing deferred due to acuity of injury (09/07/22); resistive testing continues to be deferred but see AROM for strength (10/08/2022); lacks AROM but does have good resistance to challenge within available AROM except ER - see above (11/16/2022);  Goal status: In-progress   5.  Patient will complete community, work and/or recreational activities with equal or less than 75% limitation due to current condition.  Baseline: difficulty completing any activities that require use of left UE, showering, washing dishes, dressing, hygiene, working out at [employer] for health, sleeping, housework, cooking, throwing balls, some things he does left handed (09/07/22); has noted improvement with ability to use left UE for activities that do not require overhead motion or heavy force such as putting his shirttail in, holding low on the handle of the elliptical machine, reaching for the car door, washing dishes, flossing; still cannot shampoo hair with left hand (10/08/2022); pt reports he notices more he can do every day (11/16/2022);  Goal status: In-progress   PLAN:   PT FREQUENCY: 1-2x/week   PT DURATION: 12 weeks   PLANNED INTERVENTIONS: Therapeutic exercises, Therapeutic activity, Neuromuscular re-education, Balance training, Gait training, Patient/Family education, Self Care, Joint mobilization, Dry Needling, Electrical stimulation, Spinal mobilization, Cryotherapy, Moist heat, Manual therapy, and Re-evaluation   PLAN FOR NEXT SESSION: Update HEP  as appropriate, progressive PROM, AAROM, strengthening for posture, shoulder girdle, and UE. Manual therapy for pain control, improved ROM.    Cira Rue, PT, DPT 11/16/2022, 5:57 PM  [facility] [address] [place], [place] [address] P: [phone] I F: [phone]

## 2022-11-17 ENCOUNTER — Encounter: Payer: PPO | Admitting: Physical Therapy

## 2022-11-19 ENCOUNTER — Ambulatory Visit: Payer: PPO | Admitting: Physical Therapy

## 2022-11-19 ENCOUNTER — Encounter: Payer: Self-pay | Admitting: Physical Therapy

## 2022-11-19 DIAGNOSIS — M6281 Muscle weakness (generalized): Secondary | ICD-10-CM

## 2022-11-19 DIAGNOSIS — M79622 Pain in left upper arm: Secondary | ICD-10-CM

## 2022-11-19 DIAGNOSIS — M25612 Stiffness of left shoulder, not elsewhere classified: Secondary | ICD-10-CM

## 2022-11-19 DIAGNOSIS — M25512 Pain in left shoulder: Secondary | ICD-10-CM

## 2022-11-19 NOTE — Therapy (Signed)
OUTPATIENT PHYSICAL THERAPY TREATMENT NOTE   Patient Name: Glenn Watson MRN: [id number] DOB:[birth year], 77 y.o., male Today's Date: 11/19/2022  PCP: Marguarite Arbour, MD REFERRING PROVIDER: Deeann Saint, MD  END OF SESSION:   PT End of Session - 11/19/22 1120     Visit Number 21    Number of Visits 63    Date for PT Re-Evaluation 02/08/23    Authorization Type [employer] reporting period from 10/08/2022    Progress Note Due on Visit 30    PT Start Time 1119    PT Stop Time 1159    PT Time Calculation (min) 40 min    Activity Tolerance Patient tolerated treatment well;Patient limited by pain;Patient limited by fatigue    Behavior During Therapy [facility] for tasks assessed/performed                Past Medical History:  Diagnosis Date   Anemia    Asthma    as a child, exercise asthma - none since high school"   Cancer [facility])    skin cancer - basal, squamous- Mylenoma - stage 2- chest   Complication of anesthesia    "very sensitive to medications" "Had colonscopy with just Draminine"   Family history of adverse reaction to anesthesia    daughter- N/V   Hyperlipidemia    Hypertension    PONV (postoperative nausea and vomiting)    Past Surgical History:  Procedure Laterality Date   APPENDECTOMY  2011   Dr. Lemar Livings   COLONOSCOPY WITH PROPOFOL N/A 02/10/2017   Procedure: COLONOSCOPY WITH PROPOFOL;  Surgeon: Kieth Brightly, MD;  Location: [facility] ENDOSCOPY;  Service: Endoscopy;  Laterality: N/A;   ORIF TIBIA PLATEAU Left 08/03/2019   Procedure: OPEN REDUCTION INTERNAL FIXATION (ORIF) TIBIAL PLATEAU;  Surgeon: Myrene Galas, MD;  Location: [facility] OR;  Service: Orthopedics;  Laterality: Left;   Patient Active Problem List   Diagnosis Date Noted   Prostate cancer (HCC) 03/04/2021   Hypertension    Hyperlipidemia    Asthma    Tibial plateau fracture, left, closed, with nonunion, subsequent encounter 08/03/2019   Closed fracture of shaft of tibia 03/28/2019    Retrocalcaneal bursitis, right 04/05/2018   Chronic anemia 11/14/2013   OA (osteoarthritis) 11/14/2013    REFERRING DIAG: 4-part fracture of surgical neck of left humerus  THERAPY DIAG:  Pain in left upper arm  Stiffness of left shoulder, not elsewhere classified  Left shoulder pain, unspecified chronicity  Muscle weakness (generalized)  Rationale for Evaluation and Treatment: Rehabilitation  PERTINENT HISTORY: Patient is a 77 y.o. male who presents to outpatient physical therapy with a referral for medical diagnosis 4-part fracture of surgical neck of left humerus. This patient's chief complaints consist of left upper arm and shoulder pain, shoulder stiffness, and L UE weakness/swelling 2/2 L humeral neck fracture on 07/24/2022 leading to the following functional deficits: difficulty completing any activities that require use of left UE, showering, washing dishes, dressing, hygiene, working out at [employer] for health, sleeping, housework, cooking, throwing balls, some things he does left handed. Relevant past medical history and comorbidities include prostate cancer (dx Fall 2021, 40 radiation treatments in summer 2022, now doing well followed 1x a year), HTN, hyperlipidemia, asthma (as a child), ORIF L tibial Plateau (08/03/2019), OA, appendectomy, skin cancer (removed).  Patient denies hx of stroke, seizures, lung problems, heart problems, diabetes, unexplained weight loss, unexplained changes in bowel or bladder problems, unexplained stumbling or dropping things, osteoporosis, and spinal surgery  PRECAUTIONS: fracture 07/24/2022  SUBJECTIVE:                                                                                                                                                                                      SUBJECTIVE STATEMENT:  Patient states he did his HEP yesterday so his L shoulder is a little sore compared to usual. He continues to rate his pain 0/10.    PAIN:  NPRS  0/10 left shoulder/upper arm  OBJECTIVE   TODAY'S TREATMENT:  Therapeutic exercise: to centralize symptoms and improve ROM, strength, muscular endurance, and activity tolerance required for successful completion of functional activities.  - supine B  shoulder flexion with PVC stick loaded with 5#AW, 2x10 with 5 second holds. L shoulder flexion.  - supine chest press with plus, 3x10 with 4/5/5#DB.  (Manual therapy - see below)  Superset: - standing L shoulder ER with towel roll under arm and YTB, 3x10 - standing L shoulder IR with towel roll under arm and GTB, 3x10  - standing B shoulder flexion wall slide with abduction pull against YTB around wrists, AAROM for L shoulder using fingers and band to help get up wall, then shift back of hips with elbow extended to stretch into flexion. 1x5 with 10-15 second holds.   - Education on HEP including handout   Manual therapy: to reduce pain and tissue tension, improve range of motion, neuromodulation, in order to promote improved ability to complete functional activities. SUPINE - L GHJ caudal glide at 90 degrees abduction, grade I-IV - L hsoulder PROM abduction with inferior glide of GHJ, 1x7 with 5-10 second holds to improve abduction ROM.   Pt required multimodal cuing for proper technique and to facilitate improved neuromuscular control, strength, range of motion, and functional ability resulting in improved performance and form.   PATIENT EDUCATION: Education details: Exercise purpose/form. Self management techniques.  Reviewed cancelation/no-show policy with patient and confirmed patient has correct phone number for clinic; patient verbalized understanding (10/01/22). Person educated: Patient Education method: Explanation, Demonstration, Tactile cues, Verbal cues, and Handouts Education comprehension: verbalized understanding, returned demonstration, and needs further education   HOME EXERCISE PROGRAM: Access Code: [email or web address] URL:  [email or web address] Date: 11/19/2022 Prepared by: Norton Blizzard  Exercises - Supine Shoulder External Rotation in 45 Degrees Abduction AAROM with Dowel  - 3 x daily - 1 sets - 10 reps - 10 seconds hold - Seated Shoulder Flexion AAROM with Pulley Behind  - 3 x daily - 1 sets - 10-20 reps - 5-10 seconds hold - Seated Shoulder Abduction AAROM with Pulley Behind  - 3 x daily - 1 sets - 10-20 reps - 5-10 seconds hold -  Seated Shoulder Internal Rotation AAROM with Pulley (Mirrored)  - 2 x daily - 10 reps - 10 seconds hold - Standing Anatomical Position with Scapular Retraction and Depression at Wall  - 2 x daily - 1 sets - 10 reps - 5 second hold hold - Supine Shoulder Flexion with Dowel  - 2 x daily - 2 sets - 10 reps - 5 seconds hold - Supine Shoulder Protraction with Dowel  - 2 x daily - 2 sets - 10 reps - Sidelying Shoulder Abduction Palm Forward  - 1 x daily - 1 sets - 10 reps - 5 circles each way hold - Sidelying Shoulder ER with Towel and Dumbbell  - 1 x daily - 2 sets - 10 reps - 5 seconds hold - Seated Shoulder External Rotation PROM on Table  - 1-3 x daily - 2-3 sets - 10 reps - 5-10 seconds hold - Shoulder ER Stretch in Abduction  - 1-3 x daily - 2-3 sets - 10 reps - 5-10 hold - Scaption Wall Slide with Towel  - 1 x daily - 1-3 sets - 10 reps - 5 seconds hold - Shoulder External Rotation with Anchored Resistance  - 1 x daily - 3 sets - 10 reps - Shoulder Internal Rotation with Resistance  - 1 x daily - 3 sets - 10 reps - Standing Shoulder External Rotation with Resistance  - 1 x daily - 3 sets - 10 reps    ASSESSMENT:   CLINICAL IMPRESSION: Patient arrives reporting continued tolerance to PT sessions and HEP.  Today's session continued to focus on progressive strengthening and ROM exercises as tolerated. Patient continues to have pain at the top and lateral shoulder with overhead movement. Patient would benefit from continued management of limiting condition by skilled  physical therapist to address remaining impairments and functional limitations to work towards stated goals and return to PLOF or maximal functional independence.   From initial PT eval 09/07/2022: Patient is a 77 y.o. male referred to outpatient physical therapy with a medical diagnosis of 4-part fracture of surgical neck of left humerus who presents with signs and symptoms consistent with R shoulder pain, stiffness, and weakness, and L UE swelling 2/2 proximal humerus fracture on 07/24/2022 after FOOSH. Patient presents with significant swelling, pain, joint stiffness, guarding, motor control, muscle tension, muscle performance (strength/power/endurance), swelling, tissue integrity, and activity tolerance impairments that are limiting ability to complete any activities that require use of left UE, showering, washing dishes, dressing, hygiene, working out at [employer] for health, sleeping, housework, cooking, throwing balls, some things he does left handed without difficulty. He currently has significant limitations due to pain with movement overhead with empty end feel with flexion and abduction. Patient will benefit from skilled physical therapy intervention to address current body structure impairments and activity limitations to improve function and work towards goals set in current POC in order to return to prior level of function or maximal functional improvement.    OBJECTIVE IMPAIRMENTS: decreased activity tolerance, decreased coordination, decreased endurance, decreased knowledge of condition, decreased mobility, decreased ROM, decreased strength, hypomobility, increased edema, increased fascial restrictions, impaired perceived functional ability, increased muscle spasms, impaired flexibility, impaired UE functional use, improper body mechanics, and pain.    ACTIVITY LIMITATIONS: carrying, lifting, sleeping, bathing, dressing, reach over head, hygiene/grooming, and caring for others   PARTICIPATION  LIMITATIONS: meal prep, cleaning, interpersonal relationship, shopping, community activity, occupation, yard work, and   difficulty completing any activities that require use of left UE,  showering, washing dishes, dressing, hygiene, working out at [employer] for health, sleeping, housework, cooking, throwing balls, some things he does left handed   PERSONAL FACTORS: Age, Past/current experiences, Time since onset of injury/illness/exacerbation, and 3+ comorbidities:   prostate cancer (dx Fall 2021, 40 radiation treatments in summer 2022, now doing well followed 1x a year), HTN, hyperlipidemia, asthma (as a child), ORIF L tibial Plateau (08/03/2019), OA, appendectomy, skin cancer (removed) are also affecting patient's functional outcome.    REHAB POTENTIAL: Good   CLINICAL DECISION MAKING: Stable/uncomplicated   EVALUATION COMPLEXITY: Low     GOALS: Goals reviewed with patient? No   SHORT TERM GOALS: Target date: 09/21/2022   Patient will be independent with initial home exercise program for self-management of symptoms. Baseline: Initial HEP provided at IE (09/07/22); Goal status: MET     LONG TERM GOALS: Target date: 11/30/2022  Updated to 02/08/2023 for all unmet goals on 11/16/2022.    Patient will be independent with a long-term home exercise program for self-management of symptoms.  Baseline: Initial HEP provided at IE (09/07/22); excellent participation (10/08/2022); continues participation as able (11/16/2022);  Goal status: In-progress   2.  Patient will demonstrate improved FOTO to equal or greater than 67 by visit #12 to demonstrate improvement in overall condition and self-reported functional ability.  Baseline: 44 (09/07/22); 61 at visit #8 (10/01/2022); 60 at visit #20 (11/16/2022);  Goal status: In-progress   3.  Patient will demonstrate L shoulder AROM equal or greater than 130 degrees flexion, 110 degrees abduction,  80 degrees ER at 90 degrees abduction, and IR to L1 to improve his  ability to dress, reach overhead, and throw balls.  Baseline: very limited  - see objective (09/07/22); continues to be limited but improving - see objective (10/08/2022);  similar limitations but less painful  - see objective (11/16/2022);  Goal status: In-progress   4.  Patient will demonstrate L shoulder strength equal or greater than 4/5 MMT in flexion, abduction, ER, and IR to improve his ability to reach overhead, throw a ball, and dress. Baseline: testing deferred due to acuity of injury (09/07/22); resistive testing continues to be deferred but see AROM for strength (10/08/2022); lacks AROM but does have good resistance to challenge within available AROM except ER - see above (11/16/2022);  Goal status: In-progress   5.  Patient will complete community, work and/or recreational activities with equal or less than 75% limitation due to current condition.  Baseline: difficulty completing any activities that require use of left UE, showering, washing dishes, dressing, hygiene, working out at [employer] for health, sleeping, housework, cooking, throwing balls, some things he does left handed (09/07/22); has noted improvement with ability to use left UE for activities that do not require overhead motion or heavy force such as putting his shirttail in, holding low on the handle of the elliptical machine, reaching for the car door, washing dishes, flossing; still cannot shampoo hair with left hand (10/08/2022); pt reports he notices more he can do every day (11/16/2022);  Goal status: In-progress   PLAN:   PT FREQUENCY: 1-2x/week   PT DURATION: 12 weeks   PLANNED INTERVENTIONS: Therapeutic exercises, Therapeutic activity, Neuromuscular re-education, Balance training, Gait training, Patient/Family education, Self Care, Joint mobilization, Dry Needling, Electrical stimulation, Spinal mobilization, Cryotherapy, Moist heat, Manual therapy, and Re-evaluation   PLAN FOR NEXT SESSION: Update HEP as appropriate,  progressive PROM, AAROM, strengthening for posture, shoulder girdle, and UE. Manual therapy for pain control, improved ROM.  Cira Rue, PT, DPT 11/19/2022, 5:46 PM  [facility] [address] [place], [place] [address] P: [phone] I F: [phone]

## 2022-11-24 ENCOUNTER — Encounter: Payer: Self-pay | Admitting: Physical Therapy

## 2022-11-24 ENCOUNTER — Ambulatory Visit: Payer: PPO | Admitting: Physical Therapy

## 2022-11-24 DIAGNOSIS — M25612 Stiffness of left shoulder, not elsewhere classified: Secondary | ICD-10-CM

## 2022-11-24 DIAGNOSIS — M6281 Muscle weakness (generalized): Secondary | ICD-10-CM

## 2022-11-24 DIAGNOSIS — M79622 Pain in left upper arm: Secondary | ICD-10-CM | POA: Diagnosis not present

## 2022-11-24 DIAGNOSIS — M25512 Pain in left shoulder: Secondary | ICD-10-CM

## 2022-11-24 NOTE — Therapy (Addendum)
OUTPATIENT PHYSICAL THERAPY TREATMENT NOTE   Patient Name: Glenn Watson MRN: [id number] DOB:Nov 08, 1945, 77 y.o., male Today's Date: 11/24/2022  PCP: Marguarite Arbour, MD REFERRING PROVIDER: Deeann Saint, MD  END OF SESSION:   PT End of Session - 11/24/22 0949     Visit Number 22    Number of Visits 63    Date for PT Re-Evaluation 02/08/23    Authorization Type [employer] reporting period from 10/08/2022    Progress Note Due on Visit 30    PT Start Time 0948    PT Stop Time 1026    PT Time Calculation (min) 38 min    Activity Tolerance Patient tolerated treatment well;Patient limited by pain;Patient limited by fatigue    Behavior During Therapy [facility] for tasks assessed/performed                 Past Medical History:  Diagnosis Date   Anemia    Asthma    as a child, exercise asthma - none since high school"   Cancer [facility])    skin cancer - basal, squamous- Mylenoma - stage 2- chest   Complication of anesthesia    "very sensitive to medications" "Had colonscopy with just Draminine"   Family history of adverse reaction to anesthesia    daughter- N/V   Hyperlipidemia    Hypertension    PONV (postoperative nausea and vomiting)    Past Surgical History:  Procedure Laterality Date   APPENDECTOMY  2011   Dr. Lemar Livings   COLONOSCOPY WITH PROPOFOL N/A 02/10/2017   Procedure: COLONOSCOPY WITH PROPOFOL;  Surgeon: Kieth Brightly, MD;  Location: [facility] ENDOSCOPY;  Service: Endoscopy;  Laterality: N/A;   ORIF TIBIA PLATEAU Left 08/03/2019   Procedure: OPEN REDUCTION INTERNAL FIXATION (ORIF) TIBIAL PLATEAU;  Surgeon: Myrene Galas, MD;  Location: [facility] OR;  Service: Orthopedics;  Laterality: Left;   Patient Active Problem List   Diagnosis Date Noted   Prostate cancer (HCC) 03/04/2021   Hypertension    Hyperlipidemia    Asthma    Tibial plateau fracture, left, closed, with nonunion, subsequent encounter 08/03/2019   Closed fracture of shaft of tibia 03/28/2019    Retrocalcaneal bursitis, right 04/05/2018   Chronic anemia 11/14/2013   OA (osteoarthritis) 11/14/2013    REFERRING DIAG: 4-part fracture of surgical neck of left humerus  THERAPY DIAG:  Pain in left upper arm  Stiffness of left shoulder, not elsewhere classified  Left shoulder pain, unspecified chronicity  Muscle weakness (generalized)  Rationale for Evaluation and Treatment: Rehabilitation  PERTINENT HISTORY: Patient is a 77 y.o. male who presents to outpatient physical therapy with a referral for medical diagnosis 4-part fracture of surgical neck of left humerus. This patient's chief complaints consist of left upper arm and shoulder pain, shoulder stiffness, and L UE weakness/swelling 2/2 L humeral neck fracture on 07/24/2022 leading to the following functional deficits: difficulty completing any activities that require use of left UE, showering, washing dishes, dressing, hygiene, working out at [employer] for health, sleeping, housework, cooking, throwing balls, some things he does left handed. Relevant past medical history and comorbidities include prostate cancer (dx Fall 2021, 40 radiation treatments in summer 2022, now doing well followed 1x a year), HTN, hyperlipidemia, asthma (as a child), ORIF L tibial Plateau (08/03/2019), OA, appendectomy, skin cancer (removed).  Patient denies hx of stroke, seizures, lung problems, heart problems, diabetes, unexplained weight loss, unexplained changes in bowel or bladder problems, unexplained stumbling or dropping things, osteoporosis, and spinal surgery  PRECAUTIONS: fracture 07/24/2022  SUBJECTIVE:                                                                                                                                                                                      SUBJECTIVE STATEMENT:  Patient states he did his HEP yesterday including FIR AAROM stretch so he states he has a little discomfort at the anterior L shoulder (but he does  not rate this over 0/10 on NPRS). He felt okay after last PT session. He has been practicing lifting plates to the cupboard. Band exercises are going well.    PAIN:  NPRS 0/10 left shoulder/upper arm  OBJECTIVE   TODAY'S TREATMENT:  Therapeutic exercise: to centralize symptoms and improve ROM, strength, muscular endurance, and activity tolerance required for successful completion of functional activities.  - supine B  shoulder flexion with PVC stick loaded with 5#AW, 2x10 with 5 second holds. L shoulder flexion.  - supine chest press with plus, 3x10 with 5/6/6#DB.   (Manual therapy - see below)  Superset: - standing L shoulder ER with towel roll under arm and YTB, 2x10 - standing L shoulder IR with towel roll under arm and GTB, 2x10  Manual therapy: to reduce pain and tissue tension, improve range of motion, neuromodulation, in order to promote improved ability to complete functional activities. SUPINE - L GHJ caudal glide at 90 degrees abduction, grade I-IV - L [place]oulder PROM abduction with inferior glide of GHJ, 1x5 with 5-10 second holds to improve abduction ROM.  PRONE with face in blue cradle - L GHJ PA glide 3x30 seconds with humerus in ER, grade III-IV - L shoulder extension PROM with stabilization at inferior angle of L scapula, 1x10 with 5-10 seconds.   Pt required multimodal cuing for proper technique and to facilitate improved neuromuscular control, strength, range of motion, and functional ability resulting in improved performance and form.   PATIENT EDUCATION: Education details: Exercise purpose/form. Self management techniques.  Reviewed cancelation/no-show policy with patient and confirmed patient has correct phone number for clinic; patient verbalized understanding (10/01/22). Person educated: Patient Education method: Explanation, Demonstration, Tactile cues, Verbal cues, and Handouts Education comprehension: verbalized understanding, returned demonstration, and  needs further education   HOME EXERCISE PROGRAM: Access Code: [email or web address] URL: [email or web address] Date: 11/19/2022 Prepared by: Norton Blizzard  Exercises - Supine Shoulder External Rotation in 45 Degrees Abduction AAROM with Dowel  - 3 x daily - 1 sets - 10 reps - 10 seconds hold - Seated Shoulder Flexion AAROM with Pulley Behind  - 3 x daily - 1 sets - 10-20 reps - 5-10 seconds hold - Seated Shoulder Abduction AAROM with Pulley Behind  -  3 x daily - 1 sets - 10-20 reps - 5-10 seconds hold - Seated Shoulder Internal Rotation AAROM with Pulley (Mirrored)  - 2 x daily - 10 reps - 10 seconds hold - Standing Anatomical Position with Scapular Retraction and Depression at Wall  - 2 x daily - 1 sets - 10 reps - 5 second hold hold - Supine Shoulder Flexion with Dowel  - 2 x daily - 2 sets - 10 reps - 5 seconds hold - Supine Shoulder Protraction with Dowel  - 2 x daily - 2 sets - 10 reps - Sidelying Shoulder Abduction Palm Forward  - 1 x daily - 1 sets - 10 reps - 5 circles each way hold - Sidelying Shoulder ER with Towel and Dumbbell  - 1 x daily - 2 sets - 10 reps - 5 seconds hold - Seated Shoulder External Rotation PROM on Table  - 1-3 x daily - 2-3 sets - 10 reps - 5-10 seconds hold - Shoulder ER Stretch in Abduction  - 1-3 x daily - 2-3 sets - 10 reps - 5-10 hold - Scaption Wall Slide with Towel  - 1 x daily - 1-3 sets - 10 reps - 5 seconds hold - Shoulder External Rotation with Anchored Resistance  - 1 x daily - 3 sets - 10 reps - Shoulder Internal Rotation with Resistance  - 1 x daily - 3 sets - 10 reps - Standing Shoulder External Rotation with Resistance  - 1 x daily - 3 sets - 10 reps    ASSESSMENT:   CLINICAL IMPRESSION: Patient arrives reporting continued participation in HEP and good tolerance to previous PT sessions. Today's session continued to address patient's limitation in strength and ROM. Patient tolerated well and reported his shoulder felt less tight by end of  session. Patient would benefit from continued management of limiting condition by skilled physical therapist to address remaining impairments and functional limitations to work towards stated goals and return to PLOF or maximal functional independence.   From initial PT eval 09/07/2022: Patient is a 76 y.o. male referred to outpatient physical therapy with a medical diagnosis of 4-part fracture of surgical neck of left humerus who presents with signs and symptoms consistent with R shoulder pain, stiffness, and weakness, and L UE swelling 2/2 proximal humerus fracture on 07/24/2022 after FOOSH. Patient presents with significant swelling, pain, joint stiffness, guarding, motor control, muscle tension, muscle performance (strength/power/endurance), swelling, tissue integrity, and activity tolerance impairments that are limiting ability to complete any activities that require use of left UE, showering, washing dishes, dressing, hygiene, working out at [employer] for health, sleeping, housework, cooking, throwing balls, some things he does left handed without difficulty. He currently has significant limitations due to pain with movement overhead with empty end feel with flexion and abduction. Patient will benefit from skilled physical therapy intervention to address current body structure impairments and activity limitations to improve function and work towards goals set in current POC in order to return to prior level of function or maximal functional improvement.    OBJECTIVE IMPAIRMENTS: decreased activity tolerance, decreased coordination, decreased endurance, decreased knowledge of condition, decreased mobility, decreased ROM, decreased strength, hypomobility, increased edema, increased fascial restrictions, impaired perceived functional ability, increased muscle spasms, impaired flexibility, impaired UE functional use, improper body mechanics, and pain.    ACTIVITY LIMITATIONS: carrying, lifting, sleeping,  bathing, dressing, reach over head, hygiene/grooming, and caring for others   PARTICIPATION LIMITATIONS: meal prep, cleaning, interpersonal relationship, shopping, community activity, occupation,  yard work, and   difficulty completing any activities that require use of left UE, showering, washing dishes, dressing, hygiene, working out at [employer] for health, sleeping, housework, cooking, throwing balls, some things he does left handed   PERSONAL FACTORS: Age, Past/current experiences, Time since onset of injury/illness/exacerbation, and 3+ comorbidities:   prostate cancer (dx Fall 2021, 40 radiation treatments in summer 2022, now doing well followed 1x a year), HTN, hyperlipidemia, asthma (as a child), ORIF L tibial Plateau (08/03/2019), OA, appendectomy, skin cancer (removed) are also affecting patient's functional outcome.    REHAB POTENTIAL: Good   CLINICAL DECISION MAKING: Stable/uncomplicated   EVALUATION COMPLEXITY: Low     GOALS: Goals reviewed with patient? No   SHORT TERM GOALS: Target date: 09/21/2022   Patient will be independent with initial home exercise program for self-management of symptoms. Baseline: Initial HEP provided at IE (09/07/22); Goal status: MET     LONG TERM GOALS: Target date: 11/30/2022  Updated to 02/08/2023 for all unmet goals on 11/16/2022.    Patient will be independent with a long-term home exercise program for self-management of symptoms.  Baseline: Initial HEP provided at IE (09/07/22); excellent participation (10/08/2022); continues participation as able (11/16/2022);  Goal status: In-progress   2.  Patient will demonstrate improved FOTO to equal or greater than 67 by visit #12 to demonstrate improvement in overall condition and self-reported functional ability.  Baseline: 44 (09/07/22); 61 at visit #8 (10/01/2022); 60 at visit #20 (11/16/2022);  Goal status: In-progress   3.  Patient will demonstrate L shoulder AROM equal or greater than 130 degrees flexion,  110 degrees abduction,  80 degrees ER at 90 degrees abduction, and IR to L1 to improve his ability to dress, reach overhead, and throw balls.  Baseline: very limited  - see objective (09/07/22); continues to be limited but improving - see objective (10/08/2022);  similar limitations but less painful  - see objective (11/16/2022);  Goal status: In-progress   4.  Patient will demonstrate L shoulder strength equal or greater than 4/5 MMT in flexion, abduction, ER, and IR to improve his ability to reach overhead, throw a ball, and dress. Baseline: testing deferred due to acuity of injury (09/07/22); resistive testing continues to be deferred but see AROM for strength (10/08/2022); lacks AROM but does have good resistance to challenge within available AROM except ER - see above (11/16/2022);  Goal status: In-progress   5.  Patient will complete community, work and/or recreational activities with equal or less than 75% limitation due to current condition.  Baseline: difficulty completing any activities that require use of left UE, showering, washing dishes, dressing, hygiene, working out at [employer] for health, sleeping, housework, cooking, throwing balls, some things he does left handed (09/07/22); has noted improvement with ability to use left UE for activities that do not require overhead motion or heavy force such as putting his shirttail in, holding low on the handle of the elliptical machine, reaching for the car door, washing dishes, flossing; still cannot shampoo hair with left hand (10/08/2022); pt reports he notices more he can do every day (11/16/2022);  Goal status: In-progress   PLAN:   PT FREQUENCY: 1-2x/week   PT DURATION: 12 weeks   PLANNED INTERVENTIONS: Therapeutic exercises, Therapeutic activity, Neuromuscular re-education, Balance training, Gait training, Patient/Family education, Self Care, Joint mobilization, Dry Needling, Electrical stimulation, Spinal mobilization, Cryotherapy, Moist  heat, Manual therapy, and Re-evaluation   PLAN FOR NEXT SESSION: Update HEP as appropriate, progressive PROM, AAROM, strengthening  for posture, shoulder girdle, and UE. Manual therapy for pain control, improved ROM.    Cira Rue, PT, DPT 11/24/2022, 11:53 AM  [facility] [address] [place], [place] [address] P: [phone] I F: [phone]

## 2022-11-26 ENCOUNTER — Ambulatory Visit: Payer: PPO | Admitting: Physical Therapy

## 2022-11-26 ENCOUNTER — Encounter: Payer: Self-pay | Admitting: Physical Therapy

## 2022-11-26 DIAGNOSIS — M79622 Pain in left upper arm: Secondary | ICD-10-CM | POA: Diagnosis not present

## 2022-11-26 DIAGNOSIS — M6281 Muscle weakness (generalized): Secondary | ICD-10-CM

## 2022-11-26 DIAGNOSIS — M25612 Stiffness of left shoulder, not elsewhere classified: Secondary | ICD-10-CM

## 2022-11-26 DIAGNOSIS — M25512 Pain in left shoulder: Secondary | ICD-10-CM

## 2022-11-26 NOTE — Therapy (Signed)
OUTPATIENT PHYSICAL THERAPY TREATMENT NOTE   Patient Name: Glenn Watson MRN: [id number] DOB:1946/03/09, 77 y.o., male Today's Date: 11/26/2022  PCP: Marguarite Arbour, MD REFERRING PROVIDER: Deeann Saint, MD  END OF SESSION:   PT End of Session - 11/26/22 0949     Visit Number 23    Number of Visits 63    Date for PT Re-Evaluation 02/08/23    Authorization Type [employer] reporting period from 10/08/2022    Progress Note Due on Visit 30    PT Start Time 0946    PT Stop Time 1025    PT Time Calculation (min) 39 min    Activity Tolerance Patient tolerated treatment well;Patient limited by pain;Patient limited by fatigue    Behavior During Therapy [facility] for tasks assessed/performed                  Past Medical History:  Diagnosis Date   Anemia    Asthma    as a child, exercise asthma - none since high school"   Cancer [facility])    skin cancer - basal, squamous- Mylenoma - stage 2- chest   Complication of anesthesia    "very sensitive to medications" "Had colonscopy with just Draminine"   Family history of adverse reaction to anesthesia    daughter- N/V   Hyperlipidemia    Hypertension    PONV (postoperative nausea and vomiting)    Past Surgical History:  Procedure Laterality Date   APPENDECTOMY  2011   Dr. Lemar Livings   COLONOSCOPY WITH PROPOFOL N/A 02/10/2017   Procedure: COLONOSCOPY WITH PROPOFOL;  Surgeon: Kieth Brightly, MD;  Location: [facility] ENDOSCOPY;  Service: Endoscopy;  Laterality: N/A;   ORIF TIBIA PLATEAU Left 08/03/2019   Procedure: OPEN REDUCTION INTERNAL FIXATION (ORIF) TIBIAL PLATEAU;  Surgeon: Myrene Galas, MD;  Location: [facility] OR;  Service: Orthopedics;  Laterality: Left;   Patient Active Problem List   Diagnosis Date Noted   Prostate cancer (HCC) 03/04/2021   Hypertension    Hyperlipidemia    Asthma    Tibial plateau fracture, left, closed, with nonunion, subsequent encounter 08/03/2019   Closed fracture of shaft of tibia  03/28/2019   Retrocalcaneal bursitis, right 04/05/2018   Chronic anemia 11/14/2013   OA (osteoarthritis) 11/14/2013    REFERRING DIAG: 4-part fracture of surgical neck of left humerus  THERAPY DIAG:  Pain in left upper arm  Stiffness of left shoulder, not elsewhere classified  Left shoulder pain, unspecified chronicity  Muscle weakness (generalized)  Rationale for Evaluation and Treatment: Rehabilitation  PERTINENT HISTORY: Patient is a 77 y.o. male who presents to outpatient physical therapy with a referral for medical diagnosis 4-part fracture of surgical neck of left humerus. This patient's chief complaints consist of left upper arm and shoulder pain, shoulder stiffness, and L UE weakness/swelling 2/2 L humeral neck fracture on 07/24/2022 leading to the following functional deficits: difficulty completing any activities that require use of left UE, showering, washing dishes, dressing, hygiene, working out at [employer] for health, sleeping, housework, cooking, throwing balls, some things he does left handed. Relevant past medical history and comorbidities include prostate cancer (dx Fall 2021, 40 radiation treatments in summer 2022, now doing well followed 1x a year), HTN, hyperlipidemia, asthma (as a child), ORIF L tibial Plateau (08/03/2019), OA, appendectomy, skin cancer (removed).  Patient denies hx of stroke, seizures, lung problems, heart problems, diabetes, unexplained weight loss, unexplained changes in bowel or bladder problems, unexplained stumbling or dropping things, osteoporosis, and spinal surgery  PRECAUTIONS: fracture 07/24/2022  SUBJECTIVE:                                                                                                                                                                                      SUBJECTIVE STATEMENT:  Patient states his left shoulder is a little sore from doing his HEP yesterday. He states he has been working on sidelying arm circles and  the circles were really big yesterday. He does not consider the soreness he has to be pain.    PAIN:  NPRS 0/10 left shoulder/upper arm  OBJECTIVE   TODAY'S TREATMENT:  Therapeutic exercise: to centralize symptoms and improve ROM, strength, muscular endurance, and activity tolerance required for successful completion of functional activities.  - supine B  shoulder flexion with PVC stick loaded with 7.5#AW, 2x10 with 5 second holds. L shoulder flexion.  - supine chest press with plus, 3x10 with 6#DB.   (Manual therapy - see below)  - sled push, 1x20 feet with 90# (unable to keep sled straight due to L UE weakness), 6x25 feet with 50#, using top of vertical poles.  - sidelying left shoulder circumduction with increasing diameter from 90 degrees abduction, both directions as patient completes at home (significant improvement in tolerance, control, and diameter).   Manual therapy: to reduce pain and tissue tension, improve range of motion, neuromodulation, in order to promote improved ability to complete functional activities. SUPINE - L GHJ caudal glide at 90 degrees abduction, grade I-IV, 2x30 seconds - L shoulder PROM abduction with inferior glide of GHJ, 1x5 with 5-10 second holds to improve abduction ROM.  - L GHJ PA mob grade III-IV with shoulder abducted to approx 90 degrees, 2x30 seconds.  - attempted L GHJ ER PROM but too uncomfortable.  PRONE with face in blue cradle - L GHJ PA glide 2x30 seconds with humerus in ER, grade III-IV - L shoulder extension PROM with stabilization at inferior angle of L scapula, 1x10 with 5-10 seconds.   Pt required multimodal cuing for proper technique and to facilitate improved neuromuscular control, strength, range of motion, and functional ability resulting in improved performance and form.   PATIENT EDUCATION: Education details: Exercise purpose/form. Self management techniques.  Reviewed cancelation/no-show policy with patient and confirmed  patient has correct phone number for clinic; patient verbalized understanding (10/01/22). Person educated: Patient Education method: Explanation, Demonstration, Tactile cues, Verbal cues, and Handouts Education comprehension: verbalized understanding, returned demonstration, and needs further education   HOME EXERCISE PROGRAM: Access Code: [email or web address] URL: [email or web address] Date: 11/19/2022 Prepared by: Norton Blizzard  Exercises - Supine Shoulder External Rotation in 45 Degrees Abduction AAROM with Dowel  - 3 x  daily - 1 sets - 10 reps - 10 seconds hold - Seated Shoulder Flexion AAROM with Pulley Behind  - 3 x daily - 1 sets - 10-20 reps - 5-10 seconds hold - Seated Shoulder Abduction AAROM with Pulley Behind  - 3 x daily - 1 sets - 10-20 reps - 5-10 seconds hold - Seated Shoulder Internal Rotation AAROM with Pulley (Mirrored)  - 2 x daily - 10 reps - 10 seconds hold - Standing Anatomical Position with Scapular Retraction and Depression at Wall  - 2 x daily - 1 sets - 10 reps - 5 second hold hold - Supine Shoulder Flexion with Dowel  - 2 x daily - 2 sets - 10 reps - 5 seconds hold - Supine Shoulder Protraction with Dowel  - 2 x daily - 2 sets - 10 reps - Sidelying Shoulder Abduction Palm Forward  - 1 x daily - 1 sets - 10 reps - 5 circles each way hold - Sidelying Shoulder ER with Towel and Dumbbell  - 1 x daily - 2 sets - 10 reps - 5 seconds hold - Seated Shoulder External Rotation PROM on Table  - 1-3 x daily - 2-3 sets - 10 reps - 5-10 seconds hold - Shoulder ER Stretch in Abduction  - 1-3 x daily - 2-3 sets - 10 reps - 5-10 hold - Scaption Wall Slide with Towel  - 1 x daily - 1-3 sets - 10 reps - 5 seconds hold - Shoulder External Rotation with Anchored Resistance  - 1 x daily - 3 sets - 10 reps - Shoulder Internal Rotation with Resistance  - 1 x daily - 3 sets - 10 reps - Standing Shoulder External Rotation with Resistance  - 1 x daily - 3 sets - 10 reps    ASSESSMENT:    CLINICAL IMPRESSION: Patient arrives reporting continued tolerance to last PT session and HEP that has been getting easier. Today's session continued to focus on interventions to improve ROM and strength in  order to restore function of the left UE. Patient tolerated well but is limited by pain, stiffness, weakness, and disrupted motor control. Patient would benefit from continued management of limiting condition by skilled physical therapist to address remaining impairments and functional limitations to work towards stated goals and return to PLOF or maximal functional independence.   From initial PT eval 09/07/2022: Patient is a 77 y.o. male referred to outpatient physical therapy with a medical diagnosis of 4-part fracture of surgical neck of left humerus who presents with signs and symptoms consistent with R shoulder pain, stiffness, and weakness, and L UE swelling 2/2 proximal humerus fracture on 07/24/2022 after FOOSH. Patient presents with significant swelling, pain, joint stiffness, guarding, motor control, muscle tension, muscle performance (strength/power/endurance), swelling, tissue integrity, and activity tolerance impairments that are limiting ability to complete any activities that require use of left UE, showering, washing dishes, dressing, hygiene, working out at [employer] for health, sleeping, housework, cooking, throwing balls, some things he does left handed without difficulty. He currently has significant limitations due to pain with movement overhead with empty end feel with flexion and abduction. Patient will benefit from skilled physical therapy intervention to address current body structure impairments and activity limitations to improve function and work towards goals set in current POC in order to return to prior level of function or maximal functional improvement.    OBJECTIVE IMPAIRMENTS: decreased activity tolerance, decreased coordination, decreased endurance, decreased knowledge of  condition, decreased mobility, decreased ROM,  decreased strength, hypomobility, increased edema, increased fascial restrictions, impaired perceived functional ability, increased muscle spasms, impaired flexibility, impaired UE functional use, improper body mechanics, and pain.    ACTIVITY LIMITATIONS: carrying, lifting, sleeping, bathing, dressing, reach over head, hygiene/grooming, and caring for others   PARTICIPATION LIMITATIONS: meal prep, cleaning, interpersonal relationship, shopping, community activity, occupation, yard work, and   difficulty completing any activities that require use of left UE, showering, washing dishes, dressing, hygiene, working out at [employer] for health, sleeping, housework, cooking, throwing balls, some things he does left handed   PERSONAL FACTORS: Age, Past/current experiences, Time since onset of injury/illness/exacerbation, and 3+ comorbidities:   prostate cancer (dx Fall 2021, 40 radiation treatments in summer 2022, now doing well followed 1x a year), HTN, hyperlipidemia, asthma (as a child), ORIF L tibial Plateau (08/03/2019), OA, appendectomy, skin cancer (removed) are also affecting patient's functional outcome.    REHAB POTENTIAL: Good   CLINICAL DECISION MAKING: Stable/uncomplicated   EVALUATION COMPLEXITY: Low     GOALS: Goals reviewed with patient? No   SHORT TERM GOALS: Target date: 09/21/2022   Patient will be independent with initial home exercise program for self-management of symptoms. Baseline: Initial HEP provided at IE (09/07/22); Goal status: MET     LONG TERM GOALS: Target date: 11/30/2022  Updated to 02/08/2023 for all unmet goals on 11/16/2022.    Patient will be independent with a long-term home exercise program for self-management of symptoms.  Baseline: Initial HEP provided at IE (09/07/22); excellent participation (10/08/2022); continues participation as able (11/16/2022);  Goal status: In-progress   2.  Patient will demonstrate improved  FOTO to equal or greater than 67 by visit #12 to demonstrate improvement in overall condition and self-reported functional ability.  Baseline: 44 (09/07/22); 61 at visit #8 (10/01/2022); 60 at visit #20 (11/16/2022);  Goal status: In-progress   3.  Patient will demonstrate L shoulder AROM equal or greater than 130 degrees flexion, 110 degrees abduction,  80 degrees ER at 90 degrees abduction, and IR to L1 to improve his ability to dress, reach overhead, and throw balls.  Baseline: very limited  - see objective (09/07/22); continues to be limited but improving - see objective (10/08/2022);  similar limitations but less painful  - see objective (11/16/2022);  Goal status: In-progress   4.  Patient will demonstrate L shoulder strength equal or greater than 4/5 MMT in flexion, abduction, ER, and IR to improve his ability to reach overhead, throw a ball, and dress. Baseline: testing deferred due to acuity of injury (09/07/22); resistive testing continues to be deferred but see AROM for strength (10/08/2022); lacks AROM but does have good resistance to challenge within available AROM except ER - see above (11/16/2022);  Goal status: In-progress   5.  Patient will complete community, work and/or recreational activities with equal or less than 75% limitation due to current condition.  Baseline: difficulty completing any activities that require use of left UE, showering, washing dishes, dressing, hygiene, working out at [employer] for health, sleeping, housework, cooking, throwing balls, some things he does left handed (09/07/22); has noted improvement with ability to use left UE for activities that do not require overhead motion or heavy force such as putting his shirttail in, holding low on the handle of the elliptical machine, reaching for the car door, washing dishes, flossing; still cannot shampoo hair with left hand (10/08/2022); pt reports he notices more he can do every day (11/16/2022);  Goal status: In-progress    PLAN:  PT FREQUENCY: 1-2x/week   PT DURATION: 12 weeks   PLANNED INTERVENTIONS: Therapeutic exercises, Therapeutic activity, Neuromuscular re-education, Balance training, Gait training, Patient/Family education, Self Care, Joint mobilization, Dry Needling, Electrical stimulation, Spinal mobilization, Cryotherapy, Moist heat, Manual therapy, and Re-evaluation   PLAN FOR NEXT SESSION: Update HEP as appropriate, progressive PROM, AAROM, strengthening for posture, shoulder girdle, and UE. Manual therapy for pain control, improved ROM.    Cira Rue, PT, DPT 11/26/2022, 11:30 AM  [facility] [address] [place], [place] [address] P: [phone] I F: [phone]

## 2022-11-27 DIAGNOSIS — D2262 Melanocytic nevi of left upper limb, including shoulder: Secondary | ICD-10-CM | POA: Diagnosis not present

## 2022-11-27 DIAGNOSIS — D2272 Melanocytic nevi of left lower limb, including hip: Secondary | ICD-10-CM | POA: Diagnosis not present

## 2022-11-27 DIAGNOSIS — L820 Inflamed seborrheic keratosis: Secondary | ICD-10-CM | POA: Diagnosis not present

## 2022-11-27 DIAGNOSIS — D2261 Melanocytic nevi of right upper limb, including shoulder: Secondary | ICD-10-CM | POA: Diagnosis not present

## 2022-11-27 DIAGNOSIS — L570 Actinic keratosis: Secondary | ICD-10-CM | POA: Diagnosis not present

## 2022-11-27 DIAGNOSIS — D225 Melanocytic nevi of trunk: Secondary | ICD-10-CM | POA: Diagnosis not present

## 2022-11-27 DIAGNOSIS — Z85820 Personal history of malignant melanoma of skin: Secondary | ICD-10-CM | POA: Diagnosis not present

## 2022-11-27 DIAGNOSIS — L538 Other specified erythematous conditions: Secondary | ICD-10-CM | POA: Diagnosis not present

## 2022-11-30 ENCOUNTER — Encounter: Payer: Self-pay | Admitting: Physical Therapy

## 2022-11-30 ENCOUNTER — Ambulatory Visit: Payer: PPO | Attending: Specialist | Admitting: Physical Therapy

## 2022-11-30 DIAGNOSIS — M79622 Pain in left upper arm: Secondary | ICD-10-CM | POA: Insufficient documentation

## 2022-11-30 DIAGNOSIS — M25512 Pain in left shoulder: Secondary | ICD-10-CM | POA: Insufficient documentation

## 2022-11-30 DIAGNOSIS — M6281 Muscle weakness (generalized): Secondary | ICD-10-CM | POA: Insufficient documentation

## 2022-11-30 DIAGNOSIS — M25612 Stiffness of left shoulder, not elsewhere classified: Secondary | ICD-10-CM | POA: Insufficient documentation

## 2022-11-30 NOTE — Therapy (Signed)
OUTPATIENT PHYSICAL THERAPY TREATMENT NOTE   Patient Name: Glenn Watson MRN: [id number] DOB:March 07, 1946, 77 y.o., male Today's Date: 11/30/2022  PCP: Marguarite Arbour, MD REFERRING PROVIDER: Deeann Saint, MD  END OF SESSION:   PT End of Session - 11/30/22 0905     Visit Number 24    Number of Visits 63    Date for PT Re-Evaluation 02/08/23    Authorization Type [employer] reporting period from 10/08/2022    Progress Note Due on Visit 30    PT Start Time 0902    PT Stop Time 0940    PT Time Calculation (min) 38 min    Activity Tolerance Patient tolerated treatment well;Patient limited by pain;Patient limited by fatigue    Behavior During Therapy [facility] for tasks assessed/performed              Past Medical History:  Diagnosis Date   Anemia    Asthma    as a child, exercise asthma - none since high school"   Cancer [facility])    skin cancer - basal, squamous- Mylenoma - stage 2- chest   Complication of anesthesia    "very sensitive to medications" "Had colonscopy with just Draminine"   Family history of adverse reaction to anesthesia    daughter- N/V   Hyperlipidemia    Hypertension    PONV (postoperative nausea and vomiting)    Past Surgical History:  Procedure Laterality Date   APPENDECTOMY  2011   Dr. Lemar Livings   COLONOSCOPY WITH PROPOFOL N/A 02/10/2017   Procedure: COLONOSCOPY WITH PROPOFOL;  Surgeon: Kieth Brightly, MD;  Location: [facility] ENDOSCOPY;  Service: Endoscopy;  Laterality: N/A;   ORIF TIBIA PLATEAU Left 08/03/2019   Procedure: OPEN REDUCTION INTERNAL FIXATION (ORIF) TIBIAL PLATEAU;  Surgeon: Myrene Galas, MD;  Location: [facility] OR;  Service: Orthopedics;  Laterality: Left;   Patient Active Problem List   Diagnosis Date Noted   Prostate cancer (HCC) 03/04/2021   Hypertension    Hyperlipidemia    Asthma    Tibial plateau fracture, left, closed, with nonunion, subsequent encounter 08/03/2019   Closed fracture of shaft of tibia 03/28/2019    Retrocalcaneal bursitis, right 04/05/2018   Chronic anemia 11/14/2013   OA (osteoarthritis) 11/14/2013    REFERRING DIAG: 4-part fracture of surgical neck of left humerus  THERAPY DIAG:  Pain in left upper arm  Stiffness of left shoulder, not elsewhere classified  Left shoulder pain, unspecified chronicity  Muscle weakness (generalized)  Rationale for Evaluation and Treatment: Rehabilitation  PERTINENT HISTORY: Patient is a 77 y.o. male who presents to outpatient physical therapy with a referral for medical diagnosis 4-part fracture of surgical neck of left humerus. This patient's chief complaints consist of left upper arm and shoulder pain, shoulder stiffness, and L UE weakness/swelling 2/2 L humeral neck fracture on 07/24/2022 leading to the following functional deficits: difficulty completing any activities that require use of left UE, showering, washing dishes, dressing, hygiene, working out at [employer] for health, sleeping, housework, cooking, throwing balls, some things he does left handed. Relevant past medical history and comorbidities include prostate cancer (dx Fall 2021, 40 radiation treatments in summer 2022, now doing well followed 1x a year), HTN, hyperlipidemia, asthma (as a child), ORIF L tibial Plateau (08/03/2019), OA, appendectomy, skin cancer (removed).  Patient denies hx of stroke, seizures, lung problems, heart problems, diabetes, unexplained weight loss, unexplained changes in bowel or bladder problems, unexplained stumbling or dropping things, osteoporosis, and spinal surgery   PRECAUTIONS: fracture  07/24/2022  SUBJECTIVE:                                                                                                                                                                                      SUBJECTIVE STATEMENT:  Patient states his left shoulder is doing well. His shoulder is a little sore after a busy weekend at his son's house .   PAIN:  NPRS 0/10 left  shoulder/upper arm  OBJECTIVE   TODAY'S TREATMENT:  Therapeutic exercise: to centralize symptoms and improve ROM, strength, muscular endurance, and activity tolerance required for successful completion of functional activities.  - supine B  shoulder flexion with PVC stick loaded with 7.5#AW, 2x10 with 5 second holds. L shoulder flexion.  - supine chest press with plus, 3x10 with 6/7/7#DB.  (Manual therapy - see below) - supine B shoulder flexion with active abduction against RTB loop, 3x10 - sidelying left shoulder circumduction with increasing diameter from 90 degrees abduction, both directions as patient completes at home (significant improvement in tolerance, control, and diameter).   Manual therapy: to reduce pain and tissue tension, improve range of motion, neuromodulation, in order to promote improved ability to complete functional activities. SUPINE - L GHJ AP glide at 30 degrees abduction and max IR, 3x30 seconds, grade III-IV - L GHJ caudale glide at 90 degrees abduction, 3x30 seconds, grade III-IV - L GHJ PA glide at 70 degrees abduction in ER with arm stabilized on double towel roll, 2x30 seconds, grade III-IV - L shoulder PROM ER with various hold times with OP to tolerance and upper arm propped on double towel roll.  SIDELYING - L shoulder extension PROM, 1x10 with 5-10 second holds - L shoulder FIR PROM, 1x10 with 5-10 second holds.   Pt required multimodal cuing for proper technique and to facilitate improved neuromuscular control, strength, range of motion, and functional ability resulting in improved performance and form.   PATIENT EDUCATION: Education details: Exercise purpose/form. Self management techniques.  Reviewed cancelation/no-show policy with patient and confirmed patient has correct phone number for clinic; patient verbalized understanding (10/01/22). Person educated: Patient Education method: Explanation, Demonstration, Tactile cues, Verbal cues, and  Handouts Education comprehension: verbalized understanding, returned demonstration, and needs further education   HOME EXERCISE PROGRAM: Access Code: [email or web address] URL: [email or web address] Date: 11/19/2022 Prepared by: Norton Blizzard  Exercises - Supine Shoulder External Rotation in 45 Degrees Abduction AAROM with Dowel  - 3 x daily - 1 sets - 10 reps - 10 seconds hold - Seated Shoulder Flexion AAROM with Pulley Behind  - 3 x daily - 1 sets - 10-20 reps - 5-10 seconds hold - Seated Shoulder Abduction AAROM with Pulley  Behind  - 3 x daily - 1 sets - 10-20 reps - 5-10 seconds hold - Seated Shoulder Internal Rotation AAROM with Pulley (Mirrored)  - 2 x daily - 10 reps - 10 seconds hold - Standing Anatomical Position with Scapular Retraction and Depression at Wall  - 2 x daily - 1 sets - 10 reps - 5 second hold hold - Supine Shoulder Flexion with Dowel  - 2 x daily - 2 sets - 10 reps - 5 seconds hold - Supine Shoulder Protraction with Dowel  - 2 x daily - 2 sets - 10 reps - Sidelying Shoulder Abduction Palm Forward  - 1 x daily - 1 sets - 10 reps - 5 circles each way hold - Sidelying Shoulder ER with Towel and Dumbbell  - 1 x daily - 2 sets - 10 reps - 5 seconds hold - Seated Shoulder External Rotation PROM on Table  - 1-3 x daily - 2-3 sets - 10 reps - 5-10 seconds hold - Shoulder ER Stretch in Abduction  - 1-3 x daily - 2-3 sets - 10 reps - 5-10 hold - Scaption Wall Slide with Towel  - 1 x daily - 1-3 sets - 10 reps - 5 seconds hold - Shoulder External Rotation with Anchored Resistance  - 1 x daily - 3 sets - 10 reps - Shoulder Internal Rotation with Resistance  - 1 x daily - 3 sets - 10 reps - Standing Shoulder External Rotation with Resistance  - 1 x daily - 3 sets - 10 reps    ASSESSMENT:   CLINICAL IMPRESSION: Patient arrives reporting some soreness after an active weekend. Today's session focused more on manual therapy and stretching to maximize motion with some progressive  exercise for scapular and shoulder girdle strengthening an motor control. Pateint continues to be very stiff and weak in L shoulder ER but is improving his protraction and abduction strength and tolerance. Patient would benefit from continued management of limiting condition by skilled physical therapist to address remaining impairments and functional limitations to work towards stated goals and return to PLOF or maximal functional independence.  From initial PT eval 09/07/2022: Patient is a 77 y.o. male referred to outpatient physical therapy with a medical diagnosis of 4-part fracture of surgical neck of left humerus who presents with signs and symptoms consistent with R shoulder pain, stiffness, and weakness, and L UE swelling 2/2 proximal humerus fracture on 07/24/2022 after FOOSH. Patient presents with significant swelling, pain, joint stiffness, guarding, motor control, muscle tension, muscle performance (strength/power/endurance), swelling, tissue integrity, and activity tolerance impairments that are limiting ability to complete any activities that require use of left UE, showering, washing dishes, dressing, hygiene, working out at [employer] for health, sleeping, housework, cooking, throwing balls, some things he does left handed without difficulty. He currently has significant limitations due to pain with movement overhead with empty end feel with flexion and abduction. Patient will benefit from skilled physical therapy intervention to address current body structure impairments and activity limitations to improve function and work towards goals set in current POC in order to return to prior level of function or maximal functional improvement.    OBJECTIVE IMPAIRMENTS: decreased activity tolerance, decreased coordination, decreased endurance, decreased knowledge of condition, decreased mobility, decreased ROM, decreased strength, hypomobility, increased edema, increased fascial restrictions, impaired  perceived functional ability, increased muscle spasms, impaired flexibility, impaired UE functional use, improper body mechanics, and pain.    ACTIVITY LIMITATIONS: carrying, lifting, sleeping, bathing, dressing, reach over  head, hygiene/grooming, and caring for others   PARTICIPATION LIMITATIONS: meal prep, cleaning, interpersonal relationship, shopping, community activity, occupation, yard work, and   difficulty completing any activities that require use of left UE, showering, washing dishes, dressing, hygiene, working out at [employer] for health, sleeping, housework, cooking, throwing balls, some things he does left handed   PERSONAL FACTORS: Age, Past/current experiences, Time since onset of injury/illness/exacerbation, and 3+ comorbidities:   prostate cancer (dx Fall 2021, 40 radiation treatments in summer 2022, now doing well followed 1x a year), HTN, hyperlipidemia, asthma (as a child), ORIF L tibial Plateau (08/03/2019), OA, appendectomy, skin cancer (removed) are also affecting patient's functional outcome.    REHAB POTENTIAL: Good   CLINICAL DECISION MAKING: Stable/uncomplicated   EVALUATION COMPLEXITY: Low     GOALS: Goals reviewed with patient? No   SHORT TERM GOALS: Target date: 09/21/2022   Patient will be independent with initial home exercise program for self-management of symptoms. Baseline: Initial HEP provided at IE (09/07/22); Goal status: MET     LONG TERM GOALS: Target date: 11/30/2022  Updated to 02/08/2023 for all unmet goals on 11/16/2022.    Patient will be independent with a long-term home exercise program for self-management of symptoms.  Baseline: Initial HEP provided at IE (09/07/22); excellent participation (10/08/2022); continues participation as able (11/16/2022);  Goal status: In-progress   2.  Patient will demonstrate improved FOTO to equal or greater than 67 by visit #12 to demonstrate improvement in overall condition and self-reported functional ability.   Baseline: 44 (09/07/22); 61 at visit #8 (10/01/2022); 60 at visit #20 (11/16/2022);  Goal status: In-progress   3.  Patient will demonstrate L shoulder AROM equal or greater than 130 degrees flexion, 110 degrees abduction,  80 degrees ER at 90 degrees abduction, and IR to L1 to improve his ability to dress, reach overhead, and throw balls.  Baseline: very limited  - see objective (09/07/22); continues to be limited but improving - see objective (10/08/2022);  similar limitations but less painful  - see objective (11/16/2022);  Goal status: In-progress   4.  Patient will demonstrate L shoulder strength equal or greater than 4/5 MMT in flexion, abduction, ER, and IR to improve his ability to reach overhead, throw a ball, and dress. Baseline: testing deferred due to acuity of injury (09/07/22); resistive testing continues to be deferred but see AROM for strength (10/08/2022); lacks AROM but does have good resistance to challenge within available AROM except ER - see above (11/16/2022);  Goal status: In-progress   5.  Patient will complete community, work and/or recreational activities with equal or less than 75% limitation due to current condition.  Baseline: difficulty completing any activities that require use of left UE, showering, washing dishes, dressing, hygiene, working out at [employer] for health, sleeping, housework, cooking, throwing balls, some things he does left handed (09/07/22); has noted improvement with ability to use left UE for activities that do not require overhead motion or heavy force such as putting his shirttail in, holding low on the handle of the elliptical machine, reaching for the car door, washing dishes, flossing; still cannot shampoo hair with left hand (10/08/2022); pt reports he notices more he can do every day (11/16/2022);  Goal status: In-progress   PLAN:   PT FREQUENCY: 1-2x/week   PT DURATION: 12 weeks   PLANNED INTERVENTIONS: Therapeutic exercises, Therapeutic  activity, Neuromuscular re-education, Balance training, Gait training, Patient/Family education, Self Care, Joint mobilization, Dry Needling, Electrical stimulation, Spinal mobilization, Cryotherapy, Moist  heat, Manual therapy, and Re-evaluation   PLAN FOR NEXT SESSION: Update HEP as appropriate, progressive PROM, AAROM, strengthening for posture, shoulder girdle, and UE. Manual therapy for pain control, improved ROM.    Cira Rue, PT, DPT 11/30/2022, 9:42 AM  [facility] [address] [place], [place] [address] P: [phone] I F: [phone]

## 2022-12-01 ENCOUNTER — Encounter: Payer: PPO | Admitting: Physical Therapy

## 2022-12-03 ENCOUNTER — Ambulatory Visit: Payer: PPO | Admitting: Physical Therapy

## 2022-12-03 ENCOUNTER — Encounter: Payer: Self-pay | Admitting: Physical Therapy

## 2022-12-03 DIAGNOSIS — M25612 Stiffness of left shoulder, not elsewhere classified: Secondary | ICD-10-CM

## 2022-12-03 DIAGNOSIS — M79622 Pain in left upper arm: Secondary | ICD-10-CM

## 2022-12-03 DIAGNOSIS — M25512 Pain in left shoulder: Secondary | ICD-10-CM

## 2022-12-03 DIAGNOSIS — M6281 Muscle weakness (generalized): Secondary | ICD-10-CM

## 2022-12-03 NOTE — Therapy (Signed)
OUTPATIENT PHYSICAL THERAPY TREATMENT NOTE   Patient Name: Glenn Watson MRN: [id number] DOB:30-Apr-1946, 77 y.o., male Today's Date: 12/03/2022  PCP: Marguarite Arbour, MD REFERRING PROVIDER: Deeann Saint, MD  END OF SESSION:   PT End of Session - 12/03/22 0949     Visit Number 25    Number of Visits 63    Date for PT Re-Evaluation 02/08/23    Authorization Type [employer] reporting period from 10/08/2022    Progress Note Due on Visit 30    PT Start Time 0946    PT Stop Time 1025    PT Time Calculation (min) 39 min    Activity Tolerance Patient tolerated treatment well;Patient limited by pain;Patient limited by fatigue    Behavior During Therapy [facility] for tasks assessed/performed               Past Medical History:  Diagnosis Date   Anemia    Asthma    as a child, exercise asthma - none since high school"   Cancer [facility])    skin cancer - basal, squamous- Mylenoma - stage 2- chest   Complication of anesthesia    "very sensitive to medications" "Had colonscopy with just Draminine"   Family history of adverse reaction to anesthesia    daughter- N/V   Hyperlipidemia    Hypertension    PONV (postoperative nausea and vomiting)    Past Surgical History:  Procedure Laterality Date   APPENDECTOMY  2011   Dr. Lemar Livings   COLONOSCOPY WITH PROPOFOL N/A 02/10/2017   Procedure: COLONOSCOPY WITH PROPOFOL;  Surgeon: Kieth Brightly, MD;  Location: [facility] ENDOSCOPY;  Service: Endoscopy;  Laterality: N/A;   ORIF TIBIA PLATEAU Left 08/03/2019   Procedure: OPEN REDUCTION INTERNAL FIXATION (ORIF) TIBIAL PLATEAU;  Surgeon: Myrene Galas, MD;  Location: [facility] OR;  Service: Orthopedics;  Laterality: Left;   Patient Active Problem List   Diagnosis Date Noted   Prostate cancer (HCC) 03/04/2021   Hypertension    Hyperlipidemia    Asthma    Tibial plateau fracture, left, closed, with nonunion, subsequent encounter 08/03/2019   Closed fracture of shaft of tibia 03/28/2019    Retrocalcaneal bursitis, right 04/05/2018   Chronic anemia 11/14/2013   OA (osteoarthritis) 11/14/2013    REFERRING DIAG: 4-part fracture of surgical neck of left humerus  THERAPY DIAG:  Pain in left upper arm  Stiffness of left shoulder, not elsewhere classified  Left shoulder pain, unspecified chronicity  Muscle weakness (generalized)  Rationale for Evaluation and Treatment: Rehabilitation  PERTINENT HISTORY: Patient is a 77 y.o. male who presents to outpatient physical therapy with a referral for medical diagnosis 4-part fracture of surgical neck of left humerus. This patient's chief complaints consist of left upper arm and shoulder pain, shoulder stiffness, and L UE weakness/swelling 2/2 L humeral neck fracture on 07/24/2022 leading to the following functional deficits: difficulty completing any activities that require use of left UE, showering, washing dishes, dressing, hygiene, working out at [employer] for health, sleeping, housework, cooking, throwing balls, some things he does left handed. Relevant past medical history and comorbidities include prostate cancer (dx Fall 2021, 40 radiation treatments in summer 2022, now doing well followed 1x a year), HTN, hyperlipidemia, asthma (as a child), ORIF L tibial Plateau (08/03/2019), OA, appendectomy, skin cancer (removed).  Patient denies hx of stroke, seizures, lung problems, heart problems, diabetes, unexplained weight loss, unexplained changes in bowel or bladder problems, unexplained stumbling or dropping things, osteoporosis, and spinal surgery   PRECAUTIONS:  fracture 07/24/2022  SUBJECTIVE:                                                                                                                                                                                      SUBJECTIVE STATEMENT:  Patient states his left shoulder is doing well. His shoulder is a little sore after a busy weekend at his son's house .   PAIN:  NPRS 0/10 left  shoulder/upper arm  OBJECTIVE   TODAY'S TREATMENT:  Therapeutic exercise: to centralize symptoms and improve ROM, strength, muscular endurance, and activity tolerance required for successful completion of functional activities.  - supine B  shoulder flexion with PVC stick loaded with 7.5#AW, 2x10 with 5 second holds. L shoulder flexion.  - supine chest press with plus, 3x10 with 7#DB.  (Manual therapy - see below) - supine B shoulder flexion at 25 degree incline with active abduction against RTB loop, 3x10  Manual therapy: to reduce pain and tissue tension, improve range of motion, neuromodulation, in order to promote improved ability to complete functional activities. SUPINE - L GHJ AP glide at 30 degrees abduction and max IR, 3x30 seconds, grade III-IV - L GHJ caudal glide at 90 degrees abduction, 3x30 seconds, grade III-IV - L shoulder PROM ER with various hold times with OP to tolerance and upper arm propped on double towel roll. 1x10-15 - L GHJ PA glide on scapuloclavicular complex at 80 degrees abduction in ER with arm stabilized on half foam roll, 3x30 seconds, grade III-IV SIDELYING - L shoulder extension PROM, 1x10 with 5-10 second holds - L shoulder FIR PROM, 1x10 with 5-10 second holds.   Pt required multimodal cuing for proper technique and to facilitate improved neuromuscular control, strength, range of motion, and functional ability resulting in improved performance and form.   PATIENT EDUCATION: Education details: Exercise purpose/form. Self management techniques.  Reviewed cancelation/no-show policy with patient and confirmed patient has correct phone number for clinic; patient verbalized understanding (10/01/22). Person educated: Patient Education method: Explanation, Demonstration, Tactile cues, Verbal cues, and Handouts Education comprehension: verbalized understanding, returned demonstration, and needs further education   HOME EXERCISE PROGRAM: Access Code:  [email or web address] URL: [email or web address] Date: 11/19/2022 Prepared by: Norton Blizzard  Exercises - Supine Shoulder External Rotation in 45 Degrees Abduction AAROM with Dowel  - 3 x daily - 1 sets - 10 reps - 10 seconds hold - Seated Shoulder Flexion AAROM with Pulley Behind  - 3 x daily - 1 sets - 10-20 reps - 5-10 seconds hold - Seated Shoulder Abduction AAROM with Pulley Behind  - 3 x daily - 1 sets - 10-20 reps - 5-10 seconds hold - Seated Shoulder  Internal Rotation AAROM with Pulley (Mirrored)  - 2 x daily - 10 reps - 10 seconds hold - Standing Anatomical Position with Scapular Retraction and Depression at Wall  - 2 x daily - 1 sets - 10 reps - 5 second hold hold - Supine Shoulder Flexion with Dowel  - 2 x daily - 2 sets - 10 reps - 5 seconds hold - Supine Shoulder Protraction with Dowel  - 2 x daily - 2 sets - 10 reps - Sidelying Shoulder Abduction Palm Forward  - 1 x daily - 1 sets - 10 reps - 5 circles each way hold - Sidelying Shoulder ER with Towel and Dumbbell  - 1 x daily - 2 sets - 10 reps - 5 seconds hold - Seated Shoulder External Rotation PROM on Table  - 1-3 x daily - 2-3 sets - 10 reps - 5-10 seconds hold - Shoulder ER Stretch in Abduction  - 1-3 x daily - 2-3 sets - 10 reps - 5-10 hold - Scaption Wall Slide with Towel  - 1 x daily - 1-3 sets - 10 reps - 5 seconds hold - Shoulder External Rotation with Anchored Resistance  - 1 x daily - 3 sets - 10 reps - Shoulder Internal Rotation with Resistance  - 1 x daily - 3 sets - 10 reps - Standing Shoulder External Rotation with Resistance  - 1 x daily - 3 sets - 10 reps    ASSESSMENT:   CLINICAL IMPRESSION: Patient arrives reporting good tolerance to last PT session and HEP. He states he feels like the manual therapy and banded IR/ER really helps. Today's session continued with interventions to improve left shoulder mobility, motor control and strength. Patient continues to have end range discomfort, strength, and motor  control deficits that limit his function. Patient would benefit from continued management of limiting condition by skilled physical therapist to address remaining impairments and functional limitations to work towards stated goals and return to PLOF or maximal functional independence.  From initial PT eval 09/07/2022: Patient is a 77 y.o. male referred to outpatient physical therapy with a medical diagnosis of 4-part fracture of surgical neck of left humerus who presents with signs and symptoms consistent with R shoulder pain, stiffness, and weakness, and L UE swelling 2/2 proximal humerus fracture on 07/24/2022 after FOOSH. Patient presents with significant swelling, pain, joint stiffness, guarding, motor control, muscle tension, muscle performance (strength/power/endurance), swelling, tissue integrity, and activity tolerance impairments that are limiting ability to complete any activities that require use of left UE, showering, washing dishes, dressing, hygiene, working out at [employer] for health, sleeping, housework, cooking, throwing balls, some things he does left handed without difficulty. He currently has significant limitations due to pain with movement overhead with empty end feel with flexion and abduction. Patient will benefit from skilled physical therapy intervention to address current body structure impairments and activity limitations to improve function and work towards goals set in current POC in order to return to prior level of function or maximal functional improvement.    OBJECTIVE IMPAIRMENTS: decreased activity tolerance, decreased coordination, decreased endurance, decreased knowledge of condition, decreased mobility, decreased ROM, decreased strength, hypomobility, increased edema, increased fascial restrictions, impaired perceived functional ability, increased muscle spasms, impaired flexibility, impaired UE functional use, improper body mechanics, and pain.    ACTIVITY LIMITATIONS:  carrying, lifting, sleeping, bathing, dressing, reach over head, hygiene/grooming, and caring for others   PARTICIPATION LIMITATIONS: meal prep, cleaning, interpersonal relationship, shopping, community activity, occupation, yard work,  and   difficulty completing any activities that require use of left UE, showering, washing dishes, dressing, hygiene, working out at [employer] for health, sleeping, housework, cooking, throwing balls, some things he does left handed   PERSONAL FACTORS: Age, Past/current experiences, Time since onset of injury/illness/exacerbation, and 3+ comorbidities:   prostate cancer (dx Fall 2021, 40 radiation treatments in summer 2022, now doing well followed 1x a year), HTN, hyperlipidemia, asthma (as a child), ORIF L tibial Plateau (08/03/2019), OA, appendectomy, skin cancer (removed) are also affecting patient's functional outcome.    REHAB POTENTIAL: Good   CLINICAL DECISION MAKING: Stable/uncomplicated   EVALUATION COMPLEXITY: Low     GOALS: Goals reviewed with patient? No   SHORT TERM GOALS: Target date: 09/21/2022   Patient will be independent with initial home exercise program for self-management of symptoms. Baseline: Initial HEP provided at IE (09/07/22); Goal status: MET     LONG TERM GOALS: Target date: 11/30/2022  Updated to 02/08/2023 for all unmet goals on 11/16/2022.    Patient will be independent with a long-term home exercise program for self-management of symptoms.  Baseline: Initial HEP provided at IE (09/07/22); excellent participation (10/08/2022); continues participation as able (11/16/2022);  Goal status: In-progress   2.  Patient will demonstrate improved FOTO to equal or greater than 67 by visit #12 to demonstrate improvement in overall condition and self-reported functional ability.  Baseline: 44 (09/07/22); 61 at visit #8 (10/01/2022); 60 at visit #20 (11/16/2022);  Goal status: In-progress   3.  Patient will demonstrate L shoulder AROM equal or  greater than 130 degrees flexion, 110 degrees abduction,  80 degrees ER at 90 degrees abduction, and IR to L1 to improve his ability to dress, reach overhead, and throw balls.  Baseline: very limited  - see objective (09/07/22); continues to be limited but improving - see objective (10/08/2022);  similar limitations but less painful  - see objective (11/16/2022);  Goal status: In-progress   4.  Patient will demonstrate L shoulder strength equal or greater than 4/5 MMT in flexion, abduction, ER, and IR to improve his ability to reach overhead, throw a ball, and dress. Baseline: testing deferred due to acuity of injury (09/07/22); resistive testing continues to be deferred but see AROM for strength (10/08/2022); lacks AROM but does have good resistance to challenge within available AROM except ER - see above (11/16/2022);  Goal status: In-progress   5.  Patient will complete community, work and/or recreational activities with equal or less than 75% limitation due to current condition.  Baseline: difficulty completing any activities that require use of left UE, showering, washing dishes, dressing, hygiene, working out at [employer] for health, sleeping, housework, cooking, throwing balls, some things he does left handed (09/07/22); has noted improvement with ability to use left UE for activities that do not require overhead motion or heavy force such as putting his shirttail in, holding low on the handle of the elliptical machine, reaching for the car door, washing dishes, flossing; still cannot shampoo hair with left hand (10/08/2022); pt reports he notices more he can do every day (11/16/2022);  Goal status: In-progress   PLAN:   PT FREQUENCY: 1-2x/week   PT DURATION: 12 weeks   PLANNED INTERVENTIONS: Therapeutic exercises, Therapeutic activity, Neuromuscular re-education, Balance training, Gait training, Patient/Family education, Self Care, Joint mobilization, Dry Needling, Electrical stimulation, Spinal  mobilization, Cryotherapy, Moist heat, Manual therapy, and Re-evaluation   PLAN FOR NEXT SESSION: Update HEP as appropriate, progressive PROM, AAROM, strengthening for posture,  shoulder girdle, and UE. Manual therapy for pain control, improved ROM.    Cira Rue, PT, DPT 12/03/2022, 10:29 AM  [facility] [address] [place], [place] [address] P: [phone] I F: [phone]

## 2022-12-07 ENCOUNTER — Encounter: Payer: Self-pay | Admitting: Physical Therapy

## 2022-12-07 ENCOUNTER — Ambulatory Visit: Payer: PPO | Admitting: Physical Therapy

## 2022-12-07 DIAGNOSIS — M79622 Pain in left upper arm: Secondary | ICD-10-CM | POA: Diagnosis not present

## 2022-12-07 DIAGNOSIS — M25612 Stiffness of left shoulder, not elsewhere classified: Secondary | ICD-10-CM

## 2022-12-07 DIAGNOSIS — M6281 Muscle weakness (generalized): Secondary | ICD-10-CM

## 2022-12-07 DIAGNOSIS — M25512 Pain in left shoulder: Secondary | ICD-10-CM

## 2022-12-07 NOTE — Therapy (Signed)
OUTPATIENT PHYSICAL THERAPY TREATMENT NOTE   Patient Name: Glenn Watson MRN: [id number] DOB:07/16/1945, 77 y.o., male Today's Date: 12/07/2022  PCP: Marguarite Arbour, MD REFERRING PROVIDER: Deeann Saint, MD  END OF SESSION:   PT End of Session - 12/07/22 0950     Visit Number 26    Number of Visits 63    Date for PT Re-Evaluation 02/08/23    Authorization Type [employer] reporting period from 10/08/2022    Progress Note Due on Visit 30    PT Start Time 0949    PT Stop Time 1027    PT Time Calculation (min) 38 min    Activity Tolerance Patient tolerated treatment well;Patient limited by pain;Patient limited by fatigue    Behavior During Therapy [facility] for tasks assessed/performed                Past Medical History:  Diagnosis Date   Anemia    Asthma    as a child, exercise asthma - none since high school"   Cancer [facility])    skin cancer - basal, squamous- Mylenoma - stage 2- chest   Complication of anesthesia    "very sensitive to medications" "Had colonscopy with just Draminine"   Family history of adverse reaction to anesthesia    daughter- N/V   Hyperlipidemia    Hypertension    PONV (postoperative nausea and vomiting)    Past Surgical History:  Procedure Laterality Date   APPENDECTOMY  2011   Dr. Lemar Livings   COLONOSCOPY WITH PROPOFOL N/A 02/10/2017   Procedure: COLONOSCOPY WITH PROPOFOL;  Surgeon: Kieth Brightly, MD;  Location: [facility] ENDOSCOPY;  Service: Endoscopy;  Laterality: N/A;   ORIF TIBIA PLATEAU Left 08/03/2019   Procedure: OPEN REDUCTION INTERNAL FIXATION (ORIF) TIBIAL PLATEAU;  Surgeon: Myrene Galas, MD;  Location: [facility] OR;  Service: Orthopedics;  Laterality: Left;   Patient Active Problem List   Diagnosis Date Noted   Prostate cancer (HCC) 03/04/2021   Hypertension    Hyperlipidemia    Asthma    Tibial plateau fracture, left, closed, with nonunion, subsequent encounter 08/03/2019   Closed fracture of shaft of tibia 03/28/2019    Retrocalcaneal bursitis, right 04/05/2018   Chronic anemia 11/14/2013   OA (osteoarthritis) 11/14/2013    REFERRING DIAG: 4-part fracture of surgical neck of left humerus  THERAPY DIAG:  Pain in left upper arm  Stiffness of left shoulder, not elsewhere classified  Left shoulder pain, unspecified chronicity  Muscle weakness (generalized)  Rationale for Evaluation and Treatment: Rehabilitation  PERTINENT HISTORY: Patient is a 77 y.o. male who presents to outpatient physical therapy with a referral for medical diagnosis 4-part fracture of surgical neck of left humerus. This patient's chief complaints consist of left upper arm and shoulder pain, shoulder stiffness, and L UE weakness/swelling 2/2 L humeral neck fracture on 07/24/2022 leading to the following functional deficits: difficulty completing any activities that require use of left UE, showering, washing dishes, dressing, hygiene, working out at [employer] for health, sleeping, housework, cooking, throwing balls, some things he does left handed. Relevant past medical history and comorbidities include prostate cancer (dx Fall 2021, 40 radiation treatments in summer 2022, now doing well followed 1x a year), HTN, hyperlipidemia, asthma (as a child), ORIF L tibial Plateau (08/03/2019), OA, appendectomy, skin cancer (removed).  Patient denies hx of stroke, seizures, lung problems, heart problems, diabetes, unexplained weight loss, unexplained changes in bowel or bladder problems, unexplained stumbling or dropping things, osteoporosis, and spinal surgery  PRECAUTIONS: fracture 07/24/2022  SUBJECTIVE:                                                                                                                                                                                      SUBJECTIVE STATEMENT:  Patient reports he is feeling well this morning. His shoulder is a little sore as usual but he does not consider it pain. He did HEP exercises  Saturday and Sunday. He thinks he got the highest he has ever gotten with the pulley and his arm behind his back over the weekend. He has gotten a little more soreness this morning and yesterday. He has noticed he can put on long sleeved garments better.    PAIN:  NPRS 0/10 left shoulder/upper arm  OBJECTIVE   TODAY'S TREATMENT:  Therapeutic exercise: to centralize symptoms and improve ROM, strength, muscular endurance, and activity tolerance required for successful completion of functional activities.  - supine B  shoulder flexion with PVC stick loaded with 7.5#AW, 2x10 with 5 second holds. L shoulder flexion.  - supine chest press with plus, 3x10 with 7/7/8/#DB.  (Manual therapy - see below) - supine B shoulder flexion at 25 degree incline with active abduction against GTB loop, 3x10  Superset: - standing L shoulder ER with towel roll under arm and RTB, 3x10 - standing L shoulder IR with towel roll under arm and BTB, 3x10  Manual therapy: to reduce pain and tissue tension, improve range of motion, neuromodulation, in order to promote improved ability to complete functional activities. SUPINE - L GHJ AP glide at 30 degrees abduction, 3x30 seconds, grade III-IV - L GHJ caudal glide at 90 degrees abduction, 3x30 seconds, grade III-IV - L GHJ PA glide on scapuloclavicular complex at 80 degrees abduction in ER with arm stabilized on half foam roll, 3x30 seconds, grade III-IV - L shoulder PROM ER with various hold times with OP to tolerance and upper arm propped on double towel roll. 1x10-15  Pt required multimodal cuing for proper technique and to facilitate improved neuromuscular control, strength, range of motion, and functional ability resulting in improved performance and form.   PATIENT EDUCATION: Education details: Exercise purpose/form. Self management techniques.  Reviewed cancelation/no-show policy with patient and confirmed patient has correct phone number for clinic; patient  verbalized understanding (10/01/22). Person educated: Patient Education method: Explanation, Demonstration, Tactile cues, Verbal cues, and Handouts Education comprehension: verbalized understanding, returned demonstration, and needs further education   HOME EXERCISE PROGRAM: Access Code: [email or web address] URL: [email or web address] Date: 11/19/2022 Prepared by: Norton Blizzard  Exercises - Supine Shoulder External Rotation in 45 Degrees Abduction AAROM with Dowel  - 3 x daily - 1 sets -  10 reps - 10 seconds hold - Seated Shoulder Flexion AAROM with Pulley Behind  - 3 x daily - 1 sets - 10-20 reps - 5-10 seconds hold - Seated Shoulder Abduction AAROM with Pulley Behind  - 3 x daily - 1 sets - 10-20 reps - 5-10 seconds hold - Seated Shoulder Internal Rotation AAROM with Pulley (Mirrored)  - 2 x daily - 10 reps - 10 seconds hold - Standing Anatomical Position with Scapular Retraction and Depression at Wall  - 2 x daily - 1 sets - 10 reps - 5 second hold hold - Supine Shoulder Flexion with Dowel  - 2 x daily - 2 sets - 10 reps - 5 seconds hold - Supine Shoulder Protraction with Dowel  - 2 x daily - 2 sets - 10 reps - Sidelying Shoulder Abduction Palm Forward  - 1 x daily - 1 sets - 10 reps - 5 circles each way hold - Sidelying Shoulder ER with Towel and Dumbbell  - 1 x daily - 2 sets - 10 reps - 5 seconds hold - Seated Shoulder External Rotation PROM on Table  - 1-3 x daily - 2-3 sets - 10 reps - 5-10 seconds hold - Shoulder ER Stretch in Abduction  - 1-3 x daily - 2-3 sets - 10 reps - 5-10 hold - Scaption Wall Slide with Towel  - 1 x daily - 1-3 sets - 10 reps - 5 seconds hold - Shoulder External Rotation with Anchored Resistance  - 1 x daily - 3 sets - 10 reps - Shoulder Internal Rotation with Resistance  - 1 x daily - 3 sets - 10 reps - Standing Shoulder External Rotation with Resistance  - 1 x daily - 3 sets - 10 reps    ASSESSMENT:   CLINICAL IMPRESSION: Patient arrives with  continued report of soreness but good tolerance to HEP and last PT session. Today's session continued to focus on progressive strengthening and motor control of the shoulder girdle and improved ROM. Patient continues to lack ER and abduction strength and motion but was able to progress exercises slightly. Patient would benefit from continued management of limiting condition by skilled physical therapist to address remaining impairments and functional limitations to work towards stated goals and return to PLOF or maximal functional independence.   From initial PT eval 09/07/2022: Patient is a 77 y.o. male referred to outpatient physical therapy with a medical diagnosis of 4-part fracture of surgical neck of left humerus who presents with signs and symptoms consistent with R shoulder pain, stiffness, and weakness, and L UE swelling 2/2 proximal humerus fracture on 07/24/2022 after FOOSH. Patient presents with significant swelling, pain, joint stiffness, guarding, motor control, muscle tension, muscle performance (strength/power/endurance), swelling, tissue integrity, and activity tolerance impairments that are limiting ability to complete any activities that require use of left UE, showering, washing dishes, dressing, hygiene, working out at [employer] for health, sleeping, housework, cooking, throwing balls, some things he does left handed without difficulty. He currently has significant limitations due to pain with movement overhead with empty end feel with flexion and abduction. Patient will benefit from skilled physical therapy intervention to address current body structure impairments and activity limitations to improve function and work towards goals set in current POC in order to return to prior level of function or maximal functional improvement.    OBJECTIVE IMPAIRMENTS: decreased activity tolerance, decreased coordination, decreased endurance, decreased knowledge of condition, decreased mobility, decreased  ROM, decreased strength, hypomobility, increased edema, increased  fascial restrictions, impaired perceived functional ability, increased muscle spasms, impaired flexibility, impaired UE functional use, improper body mechanics, and pain.    ACTIVITY LIMITATIONS: carrying, lifting, sleeping, bathing, dressing, reach over head, hygiene/grooming, and caring for others   PARTICIPATION LIMITATIONS: meal prep, cleaning, interpersonal relationship, shopping, community activity, occupation, yard work, and   difficulty completing any activities that require use of left UE, showering, washing dishes, dressing, hygiene, working out at [employer] for health, sleeping, housework, cooking, throwing balls, some things he does left handed   PERSONAL FACTORS: Age, Past/current experiences, Time since onset of injury/illness/exacerbation, and 3+ comorbidities:   prostate cancer (dx Fall 2021, 40 radiation treatments in summer 2022, now doing well followed 1x a year), HTN, hyperlipidemia, asthma (as a child), ORIF L tibial Plateau (08/03/2019), OA, appendectomy, skin cancer (removed) are also affecting patient's functional outcome.    REHAB POTENTIAL: Good   CLINICAL DECISION MAKING: Stable/uncomplicated   EVALUATION COMPLEXITY: Low     GOALS: Goals reviewed with patient? No   SHORT TERM GOALS: Target date: 09/21/2022   Patient will be independent with initial home exercise program for self-management of symptoms. Baseline: Initial HEP provided at IE (09/07/22); Goal status: MET     LONG TERM GOALS: Target date: 11/30/2022  Updated to 02/08/2023 for all unmet goals on 11/16/2022.    Patient will be independent with a long-term home exercise program for self-management of symptoms.  Baseline: Initial HEP provided at IE (09/07/22); excellent participation (10/08/2022); continues participation as able (11/16/2022);  Goal status: In-progress   2.  Patient will demonstrate improved FOTO to equal or greater than 67 by  visit #12 to demonstrate improvement in overall condition and self-reported functional ability.  Baseline: 44 (09/07/22); 61 at visit #8 (10/01/2022); 60 at visit #20 (11/16/2022);  Goal status: In-progress   3.  Patient will demonstrate L shoulder AROM equal or greater than 130 degrees flexion, 110 degrees abduction,  80 degrees ER at 90 degrees abduction, and IR to L1 to improve his ability to dress, reach overhead, and throw balls.  Baseline: very limited  - see objective (09/07/22); continues to be limited but improving - see objective (10/08/2022);  similar limitations but less painful  - see objective (11/16/2022);  Goal status: In-progress   4.  Patient will demonstrate L shoulder strength equal or greater than 4/5 MMT in flexion, abduction, ER, and IR to improve his ability to reach overhead, throw a ball, and dress. Baseline: testing deferred due to acuity of injury (09/07/22); resistive testing continues to be deferred but see AROM for strength (10/08/2022); lacks AROM but does have good resistance to challenge within available AROM except ER - see above (11/16/2022);  Goal status: In-progress   5.  Patient will complete community, work and/or recreational activities with equal or less than 75% limitation due to current condition.  Baseline: difficulty completing any activities that require use of left UE, showering, washing dishes, dressing, hygiene, working out at [employer] for health, sleeping, housework, cooking, throwing balls, some things he does left handed (09/07/22); has noted improvement with ability to use left UE for activities that do not require overhead motion or heavy force such as putting his shirttail in, holding low on the handle of the elliptical machine, reaching for the car door, washing dishes, flossing; still cannot shampoo hair with left hand (10/08/2022); pt reports he notices more he can do every day (11/16/2022);  Goal status: In-progress   PLAN:   PT FREQUENCY:  1-2x/week  PT DURATION: 12 weeks   PLANNED INTERVENTIONS: Therapeutic exercises, Therapeutic activity, Neuromuscular re-education, Balance training, Gait training, Patient/Family education, Self Care, Joint mobilization, Dry Needling, Electrical stimulation, Spinal mobilization, Cryotherapy, Moist heat, Manual therapy, and Re-evaluation   PLAN FOR NEXT SESSION: Update HEP as appropriate, progressive PROM, AAROM, strengthening for posture, shoulder girdle, and UE. Manual therapy for pain control, improved ROM.    Cira Rue, PT, DPT 12/07/2022, 10:30 AM  [facility] [address] [place], [place] [address] P: [phone] I F: [phone]

## 2022-12-08 ENCOUNTER — Encounter: Payer: PPO | Admitting: Physical Therapy

## 2022-12-10 ENCOUNTER — Ambulatory Visit: Payer: PPO | Admitting: Physical Therapy

## 2022-12-10 ENCOUNTER — Encounter: Payer: Self-pay | Admitting: Physical Therapy

## 2022-12-10 DIAGNOSIS — M79622 Pain in left upper arm: Secondary | ICD-10-CM

## 2022-12-10 DIAGNOSIS — M6281 Muscle weakness (generalized): Secondary | ICD-10-CM

## 2022-12-10 DIAGNOSIS — M25612 Stiffness of left shoulder, not elsewhere classified: Secondary | ICD-10-CM

## 2022-12-10 DIAGNOSIS — M25512 Pain in left shoulder: Secondary | ICD-10-CM

## 2022-12-10 NOTE — Therapy (Signed)
OUTPATIENT PHYSICAL THERAPY TREATMENT NOTE   Patient Name: Glenn Watson MRN: [id number] DOB:11/28/1945, 77 y.o., male Today's Date: 12/10/2022  PCP: Marguarite Arbour, MD REFERRING PROVIDER: Deeann Saint, MD  END OF SESSION:   PT End of Session - 12/10/22 0951     Visit Number 27    Number of Visits 63    Date for PT Re-Evaluation 02/08/23    Authorization Type [employer] reporting period from 10/08/2022    Progress Note Due on Visit 30    PT Start Time 0946    PT Stop Time 1030    PT Time Calculation (min) 44 min    Activity Tolerance Patient tolerated treatment well;Patient limited by fatigue;Patient limited by pain    Behavior During Therapy [facility] for tasks assessed/performed                 Past Medical History:  Diagnosis Date   Anemia    Asthma    as a child, exercise asthma - none since high school"   Cancer (HCC)    skin cancer - basal, squamous- Mylenoma - stage 2- chest   Complication of anesthesia    "very sensitive to medications" "Had colonscopy with just Draminine"   Family history of adverse reaction to anesthesia    daughter- N/V   Hyperlipidemia    Hypertension    PONV (postoperative nausea and vomiting)    Past Surgical History:  Procedure Laterality Date   APPENDECTOMY  2011   Dr. Lemar Livings   COLONOSCOPY WITH PROPOFOL N/A 02/10/2017   Procedure: COLONOSCOPY WITH PROPOFOL;  Surgeon: Kieth Brightly, MD;  Location: [facility] ENDOSCOPY;  Service: Endoscopy;  Laterality: N/A;   ORIF TIBIA PLATEAU Left 08/03/2019   Procedure: OPEN REDUCTION INTERNAL FIXATION (ORIF) TIBIAL PLATEAU;  Surgeon: Myrene Galas, MD;  Location: [facility] OR;  Service: Orthopedics;  Laterality: Left;   Patient Active Problem List   Diagnosis Date Noted   Prostate cancer (HCC) 03/04/2021   Hypertension    Hyperlipidemia    Asthma    Tibial plateau fracture, left, closed, with nonunion, subsequent encounter 08/03/2019   Closed fracture of shaft of tibia 03/28/2019    Retrocalcaneal bursitis, right 04/05/2018   Chronic anemia 11/14/2013   OA (osteoarthritis) 11/14/2013    REFERRING DIAG: 4-part fracture of surgical neck of left humerus  THERAPY DIAG:  Pain in left upper arm  Stiffness of left shoulder, not elsewhere classified  Left shoulder pain, unspecified chronicity  Muscle weakness (generalized)  Rationale for Evaluation and Treatment: Rehabilitation  PERTINENT HISTORY: Patient is a 77 y.o. male who presents to outpatient physical therapy with a referral for medical diagnosis 4-part fracture of surgical neck of left humerus. This patient's chief complaints consist of left upper arm and shoulder pain, shoulder stiffness, and L UE weakness/swelling 2/2 L humeral neck fracture on 07/24/2022 leading to the following functional deficits: difficulty completing any activities that require use of left UE, showering, washing dishes, dressing, hygiene, working out at [employer] for health, sleeping, housework, cooking, throwing balls, some things he does left handed. Relevant past medical history and comorbidities include prostate cancer (dx Fall 2021, 40 radiation treatments in summer 2022, now doing well followed 1x a year), HTN, hyperlipidemia, asthma (as a child), ORIF L tibial Plateau (08/03/2019), OA, appendectomy, skin cancer (removed).  Patient denies hx of stroke, seizures, lung problems, heart problems, diabetes, unexplained weight loss, unexplained changes in bowel or bladder problems, unexplained stumbling or dropping things, osteoporosis, and spinal surgery  PRECAUTIONS: fracture 07/24/2022  SUBJECTIVE:                                                                                                                                                                                      SUBJECTIVE STATEMENT:  Patient reports he is doing well this morning. He states he feels like he is doing really well with the FIR stretch with the pulleys. He reports  normal soreness but does not consider it pain. He did some of his HEP yesterday. He has not been doing wall slides.    PAIN:  NPRS 0/10 left shoulder/upper arm  OBJECTIVE   TODAY'S TREATMENT:  Therapeutic exercise: to centralize symptoms and improve ROM, strength, muscular endurance, and activity tolerance required for successful completion of functional activities.  - supine B  shoulder flexion with PVC stick loaded with 10#AW, 2x10 with 5 second holds. L shoulder flexion.  - supine chest press with plus, 3x10 with 8/#DB.  (Manual therapy - see below) - supine B shoulder flexion at 25 degree incline with active abduction against GTB loop, 3x10 - standing wall slide AAROM L shoulder scaption, 1x2 with 5 second hold.   Manual therapy: to reduce pain and tissue tension, improve range of motion, neuromodulation, in order to promote improved ability to complete functional activities. SUPINE - L GHJ AP glide at 30 degrees abduction, 3x30 seconds, grade III-IV - L GHJ caudal glide at 90 degrees abduction, 3x30 seconds, grade III-IV - L shoulder PROM scaption with caudal mobilization using mob belt, 1x10 with varying hold times.  - L GHJ distraction at 45 degrees scaption, 3x30 seconds, grade III-IV - L shoulder ER PROM at 90 degrees scaption, 1x10 with varying hold times.   Pt required multimodal cuing for proper technique and to facilitate improved neuromuscular control, strength, range of motion, and functional ability resulting in improved performance and form.   PATIENT EDUCATION: Education details: Exercise purpose/form. Self management techniques.  Reviewed cancelation/no-show policy with patient and confirmed patient has correct phone number for clinic; patient verbalized understanding (10/01/22). Person educated: Patient Education method: Explanation, Demonstration, Tactile cues, Verbal cues, and Handouts Education comprehension: verbalized understanding, returned demonstration, and  needs further education   HOME EXERCISE PROGRAM: Access Code: [email or web address] URL: [email or web address] Date: 11/19/2022 Prepared by: Norton Blizzard  Exercises - Supine Shoulder External Rotation in 45 Degrees Abduction AAROM with Dowel  - 3 x daily - 1 sets - 10 reps - 10 seconds hold - Seated Shoulder Flexion AAROM with Pulley Behind  - 3 x daily - 1 sets - 10-20 reps - 5-10 seconds hold - Seated Shoulder Abduction AAROM with Pulley Behind  - 3 x  daily - 1 sets - 10-20 reps - 5-10 seconds hold - Seated Shoulder Internal Rotation AAROM with Pulley (Mirrored)  - 2 x daily - 10 reps - 10 seconds hold - Standing Anatomical Position with Scapular Retraction and Depression at Wall  - 2 x daily - 1 sets - 10 reps - 5 second hold hold - Supine Shoulder Flexion with Dowel  - 2 x daily - 2 sets - 10 reps - 5 seconds hold - Supine Shoulder Protraction with Dowel  - 2 x daily - 2 sets - 10 reps - Sidelying Shoulder Abduction Palm Forward  - 1 x daily - 1 sets - 10 reps - 5 circles each way hold - Sidelying Shoulder ER with Towel and Dumbbell  - 1 x daily - 2 sets - 10 reps - 5 seconds hold - Seated Shoulder External Rotation PROM on Table  - 1-3 x daily - 2-3 sets - 10 reps - 5-10 seconds hold - Shoulder ER Stretch in Abduction  - 1-3 x daily - 2-3 sets - 10 reps - 5-10 hold - Scaption Wall Slide with Towel  - 1 x daily - 1-3 sets - 10 reps - 5 seconds hold - Shoulder External Rotation with Anchored Resistance  - 1 x daily - 3 sets - 10 reps - Shoulder Internal Rotation with Resistance  - 1 x daily - 3 sets - 10 reps - Standing Shoulder External Rotation with Resistance  - 1 x daily - 3 sets - 10 reps    ASSESSMENT:   CLINICAL IMPRESSION: Patient arrives reporting good tolerance to PT so far. Today's session continued to focus on improving strength and motion within tolerance. Pateint continues to demonstrate singificant motion and strength loss especially for ER. Continued to utilize manual  therapy and exercise to improve strength and motion. Patient has pain at the superior aspect of the shoulder with overhead movement. Patient would benefit from continued management of limiting condition by skilled physical therapist to address remaining impairments and functional limitations to work towards stated goals and return to PLOF or maximal functional independence.   From initial PT eval 09/07/2022: Patient is a 77 y.o. male referred to outpatient physical therapy with a medical diagnosis of 4-part fracture of surgical neck of left humerus who presents with signs and symptoms consistent with R shoulder pain, stiffness, and weakness, and L UE swelling 2/2 proximal humerus fracture on 07/24/2022 after FOOSH. Patient presents with significant swelling, pain, joint stiffness, guarding, motor control, muscle tension, muscle performance (strength/power/endurance), swelling, tissue integrity, and activity tolerance impairments that are limiting ability to complete any activities that require use of left UE, showering, washing dishes, dressing, hygiene, working out at [employer] for health, sleeping, housework, cooking, throwing balls, some things he does left handed without difficulty. He currently has significant limitations due to pain with movement overhead with empty end feel with flexion and abduction. Patient will benefit from skilled physical therapy intervention to address current body structure impairments and activity limitations to improve function and work towards goals set in current POC in order to return to prior level of function or maximal functional improvement.    OBJECTIVE IMPAIRMENTS: decreased activity tolerance, decreased coordination, decreased endurance, decreased knowledge of condition, decreased mobility, decreased ROM, decreased strength, hypomobility, increased edema, increased fascial restrictions, impaired perceived functional ability, increased muscle spasms, impaired flexibility,  impaired UE functional use, improper body mechanics, and pain.    ACTIVITY LIMITATIONS: carrying, lifting, sleeping, bathing, dressing, reach over head, hygiene/grooming,  and caring for others   PARTICIPATION LIMITATIONS: meal prep, cleaning, interpersonal relationship, shopping, community activity, occupation, yard work, and   difficulty completing any activities that require use of left UE, showering, washing dishes, dressing, hygiene, working out at [employer] for health, sleeping, housework, cooking, throwing balls, some things he does left handed   PERSONAL FACTORS: Age, Past/current experiences, Time since onset of injury/illness/exacerbation, and 3+ comorbidities:   prostate cancer (dx Fall 2021, 40 radiation treatments in summer 2022, now doing well followed 1x a year), HTN, hyperlipidemia, asthma (as a child), ORIF L tibial Plateau (08/03/2019), OA, appendectomy, skin cancer (removed) are also affecting patient's functional outcome.    REHAB POTENTIAL: Good   CLINICAL DECISION MAKING: Stable/uncomplicated   EVALUATION COMPLEXITY: Low     GOALS: Goals reviewed with patient? No   SHORT TERM GOALS: Target date: 09/21/2022   Patient will be independent with initial home exercise program for self-management of symptoms. Baseline: Initial HEP provided at IE (09/07/22); Goal status: MET     LONG TERM GOALS: Target date: 11/30/2022  Updated to 02/08/2023 for all unmet goals on 11/16/2022.    Patient will be independent with a long-term home exercise program for self-management of symptoms.  Baseline: Initial HEP provided at IE (09/07/22); excellent participation (10/08/2022); continues participation as able (11/16/2022);  Goal status: In-progress   2.  Patient will demonstrate improved FOTO to equal or greater than 67 by visit #12 to demonstrate improvement in overall condition and self-reported functional ability.  Baseline: 44 (09/07/22); 61 at visit #8 (10/01/2022); 60 at visit #20 (11/16/2022);   Goal status: In-progress   3.  Patient will demonstrate L shoulder AROM equal or greater than 130 degrees flexion, 110 degrees abduction,  80 degrees ER at 90 degrees abduction, and IR to L1 to improve his ability to dress, reach overhead, and throw balls.  Baseline: very limited  - see objective (09/07/22); continues to be limited but improving - see objective (10/08/2022);  similar limitations but less painful  - see objective (11/16/2022);  Goal status: In-progress   4.  Patient will demonstrate L shoulder strength equal or greater than 4/5 MMT in flexion, abduction, ER, and IR to improve his ability to reach overhead, throw a ball, and dress. Baseline: testing deferred due to acuity of injury (09/07/22); resistive testing continues to be deferred but see AROM for strength (10/08/2022); lacks AROM but does have good resistance to challenge within available AROM except ER - see above (11/16/2022);  Goal status: In-progress   5.  Patient will complete community, work and/or recreational activities with equal or less than 75% limitation due to current condition.  Baseline: difficulty completing any activities that require use of left UE, showering, washing dishes, dressing, hygiene, working out at [employer] for health, sleeping, housework, cooking, throwing balls, some things he does left handed (09/07/22); has noted improvement with ability to use left UE for activities that do not require overhead motion or heavy force such as putting his shirttail in, holding low on the handle of the elliptical machine, reaching for the car door, washing dishes, flossing; still cannot shampoo hair with left hand (10/08/2022); pt reports he notices more he can do every day (11/16/2022);  Goal status: In-progress   PLAN:   PT FREQUENCY: 1-2x/week   PT DURATION: 12 weeks   PLANNED INTERVENTIONS: Therapeutic exercises, Therapeutic activity, Neuromuscular re-education, Balance training, Gait training, Patient/Family  education, Self Care, Joint mobilization, Dry Needling, Electrical stimulation, Spinal mobilization, Cryotherapy, Moist heat, Manual  therapy, and Re-evaluation   PLAN FOR NEXT SESSION: Update HEP as appropriate, progressive PROM, AAROM, strengthening for posture, shoulder girdle, and UE. Manual therapy for pain control, improved ROM.    Cira Rue, PT, DPT 12/10/2022, 10:37 AM  [facility] [address] [place], [place] [address] P: [phone] I F: [phone]

## 2022-12-15 ENCOUNTER — Ambulatory Visit: Payer: PPO | Admitting: Physical Therapy

## 2022-12-15 ENCOUNTER — Encounter: Payer: Self-pay | Admitting: Physical Therapy

## 2022-12-15 DIAGNOSIS — M79622 Pain in left upper arm: Secondary | ICD-10-CM | POA: Diagnosis not present

## 2022-12-15 DIAGNOSIS — M25612 Stiffness of left shoulder, not elsewhere classified: Secondary | ICD-10-CM

## 2022-12-15 DIAGNOSIS — M6281 Muscle weakness (generalized): Secondary | ICD-10-CM

## 2022-12-15 DIAGNOSIS — M25512 Pain in left shoulder: Secondary | ICD-10-CM

## 2022-12-15 NOTE — Therapy (Signed)
OUTPATIENT PHYSICAL THERAPY TREATMENT NOTE   Patient Name: Glenn Watson MRN: [id number] DOB:1945/10/15, 77 y.o., male Today's Date: 12/15/2022  PCP: Marguarite Arbour, MD REFERRING PROVIDER: Deeann Saint, MD  END OF SESSION:   PT End of Session - 12/15/22 0954     Visit Number 28    Number of Visits 63    Date for PT Re-Evaluation 02/08/23    Authorization Type [employer] reporting period from 10/08/2022    Progress Note Due on Visit 30    PT Start Time 0952    PT Stop Time 1030    PT Time Calculation (min) 38 min    Activity Tolerance Patient tolerated treatment well;Patient limited by fatigue;Patient limited by pain    Behavior During Therapy [facility] for tasks assessed/performed                 Past Medical History:  Diagnosis Date   Anemia    Asthma    as a child, exercise asthma - none since high school"   Cancer (HCC)    skin cancer - basal, squamous- Mylenoma - stage 2- chest   Complication of anesthesia    "very sensitive to medications" "Had colonscopy with just Draminine"   Family history of adverse reaction to anesthesia    daughter- N/V   Hyperlipidemia    Hypertension    PONV (postoperative nausea and vomiting)    Past Surgical History:  Procedure Laterality Date   APPENDECTOMY  2011   Dr. Lemar Livings   COLONOSCOPY WITH PROPOFOL N/A 02/10/2017   Procedure: COLONOSCOPY WITH PROPOFOL;  Surgeon: Kieth Brightly, MD;  Location: [facility] ENDOSCOPY;  Service: Endoscopy;  Laterality: N/A;   ORIF TIBIA PLATEAU Left 08/03/2019   Procedure: OPEN REDUCTION INTERNAL FIXATION (ORIF) TIBIAL PLATEAU;  Surgeon: Myrene Galas, MD;  Location: [facility] OR;  Service: Orthopedics;  Laterality: Left;   Patient Active Problem List   Diagnosis Date Noted   Prostate cancer (HCC) 03/04/2021   Hypertension    Hyperlipidemia    Asthma    Tibial plateau fracture, left, closed, with nonunion, subsequent encounter 08/03/2019   Closed fracture of shaft of tibia 03/28/2019    Retrocalcaneal bursitis, right 04/05/2018   Chronic anemia 11/14/2013   OA (osteoarthritis) 11/14/2013    REFERRING DIAG: 4-part fracture of surgical neck of left humerus  THERAPY DIAG:  Pain in left upper arm  Stiffness of left shoulder, not elsewhere classified  Left shoulder pain, unspecified chronicity  Muscle weakness (generalized)  Rationale for Evaluation and Treatment: Rehabilitation  PERTINENT HISTORY: Patient is a 77 y.o. male who presents to outpatient physical therapy with a referral for medical diagnosis 4-part fracture of surgical neck of left humerus. This patient's chief complaints consist of left upper arm and shoulder pain, shoulder stiffness, and L UE weakness/swelling 2/2 L humeral neck fracture on 07/24/2022 leading to the following functional deficits: difficulty completing any activities that require use of left UE, showering, washing dishes, dressing, hygiene, working out at [employer] for health, sleeping, housework, cooking, throwing balls, some things he does left handed. Relevant past medical history and comorbidities include prostate cancer (dx Fall 2021, 40 radiation treatments in summer 2022, now doing well followed 1x a year), HTN, hyperlipidemia, asthma (as a child), ORIF L tibial Plateau (08/03/2019), OA, appendectomy, skin cancer (removed).  Patient denies hx of stroke, seizures, lung problems, heart problems, diabetes, unexplained weight loss, unexplained changes in bowel or bladder problems, unexplained stumbling or dropping things, osteoporosis, and spinal surgery  PRECAUTIONS: fracture 07/24/2022  SUBJECTIVE:                                                                                                                                                                                      SUBJECTIVE STATEMENT:  Patient reports he is feeling well this morning. He was doing wall slides at his friend's home last night. He states he continues to have low levels  of pain.    PAIN:  NPRS 0-1/10 left shoulder/upper arm  OBJECTIVE   TODAY'S TREATMENT:  Therapeutic exercise: to centralize symptoms and improve ROM, strength, muscular endurance, and activity tolerance required for successful completion of functional activities.  - standing wall slide AAROM L shoulder scaption, 1x10 with 5 second hold. - supine chest press with plus, 1x12 with 8#DB.  - supine chest press with plus with 25 degree incline, 1x10 with 8#DB (incomplete ROM on L side).  - supine B  shoulder flexion with PVC stick loaded with 10#AW, 1x10 with 5 second holds. L shoulder flexion.  - supine chest press with plus with 25 degree incline, 2x10 with 6#DB. - supine B shoulder flexion at 25 degree incline with active abduction against GTB loop, 3x10.  Manual therapy: to reduce pain and tissue tension, improve range of motion, neuromodulation, in order to promote improved ability to complete functional activities. SUPINE - L GHJ distraction at 45 degrees scaption, 3x30 seconds, grade III-IV - L GHJ AP glide at 30 degrees abduction in full IR, 3x30 seconds, grade III-IV - L GHJ caudal glide at 90 degrees abduction, 3x30 seconds, grade III-IV - L shoulder ER PROM at 90 degrees scaption, 2x10 with varying hold times.   Pt required multimodal cuing for proper technique and to facilitate improved neuromuscular control, strength, range of motion, and functional ability resulting in improved performance and form.   PATIENT EDUCATION: Education details: Exercise purpose/form. Self management techniques.  Reviewed cancelation/no-show policy with patient and confirmed patient has correct phone number for clinic; patient verbalized understanding (10/01/22). Person educated: Patient Education method: Explanation, Demonstration, Tactile cues, Verbal cues, and Handouts Education comprehension: verbalized understanding, returned demonstration, and needs further education   HOME EXERCISE  PROGRAM: Access Code: [email or web address] URL: [email or web address] Date: 11/19/2022 Prepared by: Norton Blizzard  Exercises - Supine Shoulder External Rotation in 45 Degrees Abduction AAROM with Dowel  - 3 x daily - 1 sets - 10 reps - 10 seconds hold - Seated Shoulder Flexion AAROM with Pulley Behind  - 3 x daily - 1 sets - 10-20 reps - 5-10 seconds hold - Seated Shoulder Abduction AAROM with Pulley Behind  - 3 x daily - 1 sets - 10-20 reps -  5-10 seconds hold - Seated Shoulder Internal Rotation AAROM with Pulley (Mirrored)  - 2 x daily - 10 reps - 10 seconds hold - Standing Anatomical Position with Scapular Retraction and Depression at Wall  - 2 x daily - 1 sets - 10 reps - 5 second hold hold - Supine Shoulder Flexion with Dowel  - 2 x daily - 2 sets - 10 reps - 5 seconds hold - Supine Shoulder Protraction with Dowel  - 2 x daily - 2 sets - 10 reps - Sidelying Shoulder Abduction Palm Forward  - 1 x daily - 1 sets - 10 reps - 5 circles each way hold - Sidelying Shoulder ER with Towel and Dumbbell  - 1 x daily - 2 sets - 10 reps - 5 seconds hold - Seated Shoulder External Rotation PROM on Table  - 1-3 x daily - 2-3 sets - 10 reps - 5-10 seconds hold - Shoulder ER Stretch in Abduction  - 1-3 x daily - 2-3 sets - 10 reps - 5-10 hold - Scaption Wall Slide with Towel  - 1 x daily - 1-3 sets - 10 reps - 5 seconds hold - Shoulder External Rotation with Anchored Resistance  - 1 x daily - 3 sets - 10 reps - Shoulder Internal Rotation with Resistance  - 1 x daily - 3 sets - 10 reps - Standing Shoulder External Rotation with Resistance  - 1 x daily - 3 sets - 10 reps    ASSESSMENT:   CLINICAL IMPRESSION: Patient arrives continuing to have good tolerance to HEP and last PT session. Today's session continued to focus on progressive strengthening. Patient continues to have weakness in overhead movements, abduction, and ER but is demonstrating gradual progress. Patient would benefit from continued  management of limiting condition by skilled physical therapist to address remaining impairments and functional limitations to work towards stated goals and return to PLOF or maximal functional independence.   From initial PT eval 09/07/2022: Patient is a 77 y.o. male referred to outpatient physical therapy with a medical diagnosis of 4-part fracture of surgical neck of left humerus who presents with signs and symptoms consistent with R shoulder pain, stiffness, and weakness, and L UE swelling 2/2 proximal humerus fracture on 07/24/2022 after FOOSH. Patient presents with significant swelling, pain, joint stiffness, guarding, motor control, muscle tension, muscle performance (strength/power/endurance), swelling, tissue integrity, and activity tolerance impairments that are limiting ability to complete any activities that require use of left UE, showering, washing dishes, dressing, hygiene, working out at [employer] for health, sleeping, housework, cooking, throwing balls, some things he does left handed without difficulty. He currently has significant limitations due to pain with movement overhead with empty end feel with flexion and abduction. Patient will benefit from skilled physical therapy intervention to address current body structure impairments and activity limitations to improve function and work towards goals set in current POC in order to return to prior level of function or maximal functional improvement.    OBJECTIVE IMPAIRMENTS: decreased activity tolerance, decreased coordination, decreased endurance, decreased knowledge of condition, decreased mobility, decreased ROM, decreased strength, hypomobility, increased edema, increased fascial restrictions, impaired perceived functional ability, increased muscle spasms, impaired flexibility, impaired UE functional use, improper body mechanics, and pain.    ACTIVITY LIMITATIONS: carrying, lifting, sleeping, bathing, dressing, reach over head,  hygiene/grooming, and caring for others   PARTICIPATION LIMITATIONS: meal prep, cleaning, interpersonal relationship, shopping, community activity, occupation, yard work, and   difficulty completing any activities that require use  of left UE, showering, washing dishes, dressing, hygiene, working out at [employer] for health, sleeping, housework, cooking, throwing balls, some things he does left handed   PERSONAL FACTORS: Age, Past/current experiences, Time since onset of injury/illness/exacerbation, and 3+ comorbidities:   prostate cancer (dx Fall 2021, 40 radiation treatments in summer 2022, now doing well followed 1x a year), HTN, hyperlipidemia, asthma (as a child), ORIF L tibial Plateau (08/03/2019), OA, appendectomy, skin cancer (removed) are also affecting patient's functional outcome.    REHAB POTENTIAL: Good   CLINICAL DECISION MAKING: Stable/uncomplicated   EVALUATION COMPLEXITY: Low     GOALS: Goals reviewed with patient? No   SHORT TERM GOALS: Target date: 09/21/2022   Patient will be independent with initial home exercise program for self-management of symptoms. Baseline: Initial HEP provided at IE (09/07/22); Goal status: MET     LONG TERM GOALS: Target date: 11/30/2022  Updated to 02/08/2023 for all unmet goals on 11/16/2022.    Patient will be independent with a long-term home exercise program for self-management of symptoms.  Baseline: Initial HEP provided at IE (09/07/22); excellent participation (10/08/2022); continues participation as able (11/16/2022);  Goal status: In-progress   2.  Patient will demonstrate improved FOTO to equal or greater than 67 by visit #12 to demonstrate improvement in overall condition and self-reported functional ability.  Baseline: 44 (09/07/22); 61 at visit #8 (10/01/2022); 60 at visit #20 (11/16/2022);  Goal status: In-progress   3.  Patient will demonstrate L shoulder AROM equal or greater than 130 degrees flexion, 110 degrees abduction,  80 degrees  ER at 90 degrees abduction, and IR to L1 to improve his ability to dress, reach overhead, and throw balls.  Baseline: very limited  - see objective (09/07/22); continues to be limited but improving - see objective (10/08/2022);  similar limitations but less painful  - see objective (11/16/2022);  Goal status: In-progress   4.  Patient will demonstrate L shoulder strength equal or greater than 4/5 MMT in flexion, abduction, ER, and IR to improve his ability to reach overhead, throw a ball, and dress. Baseline: testing deferred due to acuity of injury (09/07/22); resistive testing continues to be deferred but see AROM for strength (10/08/2022); lacks AROM but does have good resistance to challenge within available AROM except ER - see above (11/16/2022);  Goal status: In-progress   5.  Patient will complete community, work and/or recreational activities with equal or less than 75% limitation due to current condition.  Baseline: difficulty completing any activities that require use of left UE, showering, washing dishes, dressing, hygiene, working out at [employer] for health, sleeping, housework, cooking, throwing balls, some things he does left handed (09/07/22); has noted improvement with ability to use left UE for activities that do not require overhead motion or heavy force such as putting his shirttail in, holding low on the handle of the elliptical machine, reaching for the car door, washing dishes, flossing; still cannot shampoo hair with left hand (10/08/2022); pt reports he notices more he can do every day (11/16/2022);  Goal status: In-progress   PLAN:   PT FREQUENCY: 1-2x/week   PT DURATION: 12 weeks   PLANNED INTERVENTIONS: Therapeutic exercises, Therapeutic activity, Neuromuscular re-education, Balance training, Gait training, Patient/Family education, Self Care, Joint mobilization, Dry Needling, Electrical stimulation, Spinal mobilization, Cryotherapy, Moist heat, Manual therapy, and  Re-evaluation   PLAN FOR NEXT SESSION: Update HEP as appropriate, progressive PROM, AAROM, strengthening for posture, shoulder girdle, and UE. Manual therapy for pain control, improved  ROM.    Cira Rue, PT, DPT 12/15/2022, 2:49 PM  [facility] [address] [place], [place] [address] P: [phone] I F: [phone]

## 2022-12-17 ENCOUNTER — Ambulatory Visit: Payer: PPO | Admitting: Physical Therapy

## 2022-12-17 ENCOUNTER — Encounter: Payer: Self-pay | Admitting: Physical Therapy

## 2022-12-17 DIAGNOSIS — M6281 Muscle weakness (generalized): Secondary | ICD-10-CM

## 2022-12-17 DIAGNOSIS — M25612 Stiffness of left shoulder, not elsewhere classified: Secondary | ICD-10-CM

## 2022-12-17 DIAGNOSIS — M79622 Pain in left upper arm: Secondary | ICD-10-CM

## 2022-12-17 DIAGNOSIS — M25512 Pain in left shoulder: Secondary | ICD-10-CM

## 2022-12-17 NOTE — Therapy (Signed)
OUTPATIENT PHYSICAL THERAPY TREATMENT NOTE   Patient Name: Glenn Watson MRN: [id number] DOB:Oct 01, 1945, 77 y.o., male Today's Date: 12/17/2022  PCP: Marguarite Arbour, MD REFERRING PROVIDER: Deeann Saint, MD  END OF SESSION:   PT End of Session - 12/17/22 1148     Visit Number 29    Number of Visits 63    Date for PT Re-Evaluation 02/08/23    Authorization Type [employer] reporting period from 10/08/2022    Progress Note Due on Visit 30    PT Start Time 1116    PT Stop Time 1156    PT Time Calculation (min) 40 min    Activity Tolerance Patient tolerated treatment well;Patient limited by fatigue;Patient limited by pain    Behavior During Therapy WFL for tasks assessed/performed               Past Medical History:  Diagnosis Date   Anemia    Asthma    as a child, exercise asthma - none since high school"   Cancer (HCC)    skin cancer - basal, squamous- Mylenoma - stage 2- chest   Complication of anesthesia    "very sensitive to medications" "Had colonscopy with just Draminine"   Family history of adverse reaction to anesthesia    daughter- N/V   Hyperlipidemia    Hypertension    PONV (postoperative nausea and vomiting)    Past Surgical History:  Procedure Laterality Date   APPENDECTOMY  2011   Dr. Lemar Livings   COLONOSCOPY WITH PROPOFOL N/A 02/10/2017   Procedure: COLONOSCOPY WITH PROPOFOL;  Surgeon: Kieth Brightly, MD;  Location: [facility];  Service: Endoscopy;  Laterality: N/A;   ORIF TIBIA PLATEAU Left 08/03/2019   Procedure: OPEN REDUCTION INTERNAL FIXATION (ORIF) TIBIAL PLATEAU;  Surgeon: Myrene Galas, MD;  Location: [facility] OR;  Service: Orthopedics;  Laterality: Left;   Patient Active Problem List   Diagnosis Date Noted   Prostate cancer (HCC) 03/04/2021   Hypertension    Hyperlipidemia    Asthma    Tibial plateau fracture, left, closed, with nonunion, subsequent encounter 08/03/2019   Closed fracture of shaft of tibia 03/28/2019    Retrocalcaneal bursitis, right 04/05/2018   Chronic anemia 11/14/2013   OA (osteoarthritis) 11/14/2013    REFERRING DIAG: 4-part fracture of surgical neck of left humerus  THERAPY DIAG:  Pain in left upper arm  Stiffness of left shoulder, not elsewhere classified  Left shoulder pain, unspecified chronicity  Muscle weakness (generalized)  Rationale for Evaluation and Treatment: Rehabilitation  PERTINENT HISTORY: Patient is a 77 y.o. male who presents to outpatient physical therapy with a referral for medical diagnosis 4-part fracture of surgical neck of left humerus. This patient's chief complaints consist of left upper arm and shoulder pain, shoulder stiffness, and L UE weakness/swelling 2/2 L humeral neck fracture on 07/24/2022 leading to the following functional deficits: difficulty completing any activities that require use of left UE, showering, washing dishes, dressing, hygiene, working out at [employer] for health, sleeping, housework, cooking, throwing balls, some things he does left handed. Relevant past medical history and comorbidities include prostate cancer (dx Fall 2021, 40 radiation treatments in summer 2022, now doing well followed 1x a year), HTN, hyperlipidemia, asthma (as a child), ORIF L tibial Plateau (08/03/2019), OA, appendectomy, skin cancer (removed).  Patient denies hx of stroke, seizures, lung problems, heart problems, diabetes, unexplained weight loss, unexplained changes in bowel or bladder problems, unexplained stumbling or dropping things, osteoporosis, and spinal surgery   PRECAUTIONS:  fracture 07/24/2022  SUBJECTIVE:                                                                                                                                                                                      SUBJECTIVE STATEMENT:  Patient reports he is feeling well today but did not get much HEP done since last PT session since he had to take an urgent trip to his son's house near  [place].    PAIN:  NPRS 0-1/10 left shoulder/upper arm  OBJECTIVE   TODAY'S TREATMENT:  Therapeutic exercise: to centralize symptoms and improve ROM, strength, muscular endurance, and activity tolerance required for successful completion of functional activities.  - standing wall slide AAROM L shoulder scaption, 1x10 with 5 second hold. - supine B  shoulder flexion with PVC stick loaded with 10#AW, 2x10 with 5 second holds.  - supine chest press with plus, 1x12 with 8#DB.  - supine chest press with plus with 25 degree incline, 2x10 with 6/4#DB (incomplete ROM on L side).  - supine B shoulder flexion at 25 degree incline with active abduction against GTB loop, 3x10.  Circuit:  - sidelying L shoulder abduction hip to 90 degrees, 2x10/9 AROM - sidelying L shoulder ER AROM, 2x10 (2nd set with towel roll bolster) - sidelying L shoulder horizontal abduction short arc AROM , 2x10 (hands together in front of chest, left arm  lifting towards ceiling).   Manual therapy: to reduce pain and tissue tension, improve range of motion, neuromodulation, in order to promote improved ability to complete functional activities. SUPINE - L GHJ distraction at 45 degrees scaption, 3x30 seconds, grade III-IV - L GHJ AP glide at 30 degrees abduction in full IR, 3x30 seconds, grade III-IV - L GHJ distraction at 90 degrees abduction, 3x30 seconds, grade III-IV - L GHJ caudal glide at 90 degrees abduction, 3x30 seconds, grade III-IV - L shoulder ER PROM at 90 degrees scaption, 1x10 with 5 second hold.   Pt required multimodal cuing for proper technique and to facilitate improved neuromuscular control, strength, range of motion, and functional ability resulting in improved performance and form.   PATIENT EDUCATION: Education details: Exercise purpose/form. Self management techniques.  Reviewed cancelation/no-show policy with patient and confirmed patient has correct phone number for clinic; patient verbalized  understanding (10/01/22). Person educated: Patient Education method: Explanation, Demonstration, Tactile cues, Verbal cues, and Handouts Education comprehension: verbalized understanding, returned demonstration, and needs further education   HOME EXERCISE PROGRAM: Access Code: [email or web address] URL: [email or web address] Date: 11/19/2022 Prepared by: Norton Blizzard  Exercises - Supine Shoulder External Rotation in 45 Degrees Abduction AAROM with Dowel  - 3 x daily - 1 sets -  10 reps - 10 seconds hold - Seated Shoulder Flexion AAROM with Pulley Behind  - 3 x daily - 1 sets - 10-20 reps - 5-10 seconds hold - Seated Shoulder Abduction AAROM with Pulley Behind  - 3 x daily - 1 sets - 10-20 reps - 5-10 seconds hold - Seated Shoulder Internal Rotation AAROM with Pulley (Mirrored)  - 2 x daily - 10 reps - 10 seconds hold - Standing Anatomical Position with Scapular Retraction and Depression at Wall  - 2 x daily - 1 sets - 10 reps - 5 second hold hold - Supine Shoulder Flexion with Dowel  - 2 x daily - 2 sets - 10 reps - 5 seconds hold - Supine Shoulder Protraction with Dowel  - 2 x daily - 2 sets - 10 reps - Sidelying Shoulder Abduction Palm Forward  - 1 x daily - 1 sets - 10 reps - 5 circles each way hold - Sidelying Shoulder ER with Towel and Dumbbell  - 1 x daily - 2 sets - 10 reps - 5 seconds hold - Seated Shoulder External Rotation PROM on Table  - 1-3 x daily - 2-3 sets - 10 reps - 5-10 seconds hold - Shoulder ER Stretch in Abduction  - 1-3 x daily - 2-3 sets - 10 reps - 5-10 hold - Scaption Wall Slide with Towel  - 1 x daily - 1-3 sets - 10 reps - 5 seconds hold - Shoulder External Rotation with Anchored Resistance  - 1 x daily - 3 sets - 10 reps - Shoulder Internal Rotation with Resistance  - 1 x daily - 3 sets - 10 reps - Standing Shoulder External Rotation with Resistance  - 1 x daily - 3 sets - 10 reps    ASSESSMENT:   CLINICAL IMPRESSION: Patient arrives reporting continued  tolerance for HEP and last PT session. Today's session continued to focus on improving ROM and strength. Patient would benefit from maximizing deltoid strength with movement pattern suggesting rotator cuff in competence. Patient was able to progress to full sidelying L shoulder abduction AROM.  Patient was limited by pain at the lateral left shoulder and weakness. Patient would benefit from continued management of limiting condition by skilled physical therapist to address remaining impairments and functional limitations to work towards stated goals and return to PLOF or maximal functional independence.    From initial PT eval 09/07/2022: Patient is a 77 y.o. male referred to outpatient physical therapy with a medical diagnosis of 4-part fracture of surgical neck of left humerus who presents with signs and symptoms consistent with R shoulder pain, stiffness, and weakness, and L UE swelling 2/2 proximal humerus fracture on 07/24/2022 after FOOSH. Patient presents with significant swelling, pain, joint stiffness, guarding, motor control, muscle tension, muscle performance (strength/power/endurance), swelling, tissue integrity, and activity tolerance impairments that are limiting ability to complete any activities that require use of left UE, showering, washing dishes, dressing, hygiene, working out at [employer] for health, sleeping, housework, cooking, throwing balls, some things he does left handed without difficulty. He currently has significant limitations due to pain with movement overhead with empty end feel with flexion and abduction. Patient will benefit from skilled physical therapy intervention to address current body structure impairments and activity limitations to improve function and work towards goals set in current POC in order to return to prior level of function or maximal functional improvement.    OBJECTIVE IMPAIRMENTS: decreased activity tolerance, decreased coordination, decreased endurance,  decreased knowledge of  condition, decreased mobility, decreased ROM, decreased strength, hypomobility, increased edema, increased fascial restrictions, impaired perceived functional ability, increased muscle spasms, impaired flexibility, impaired UE functional use, improper body mechanics, and pain.    ACTIVITY LIMITATIONS: carrying, lifting, sleeping, bathing, dressing, reach over head, hygiene/grooming, and caring for others   PARTICIPATION LIMITATIONS: meal prep, cleaning, interpersonal relationship, shopping, community activity, occupation, yard work, and   difficulty completing any activities that require use of left UE, showering, washing dishes, dressing, hygiene, working out at [employer] for health, sleeping, housework, cooking, throwing balls, some things he does left handed   PERSONAL FACTORS: Age, Past/current experiences, Time since onset of injury/illness/exacerbation, and 3+ comorbidities:   prostate cancer (dx Fall 2021, 40 radiation treatments in summer 2022, now doing well followed 1x a year), HTN, hyperlipidemia, asthma (as a child), ORIF L tibial Plateau (08/03/2019), OA, appendectomy, skin cancer (removed) are also affecting patient's functional outcome.    REHAB POTENTIAL: Good   CLINICAL DECISION MAKING: Stable/uncomplicated   EVALUATION COMPLEXITY: Low     GOALS: Goals reviewed with patient? No   SHORT TERM GOALS: Target date: 09/21/2022   Patient will be independent with initial home exercise program for self-management of symptoms. Baseline: Initial HEP provided at [facility] (09/07/22); Goal status: MET     LONG TERM GOALS: Target date: 11/30/2022  Updated to 02/08/2023 for all unmet goals on 11/16/2022.    Patient will be independent with a long-term home exercise program for self-management of symptoms.  Baseline: Initial HEP provided at [facility] (09/07/22); excellent participation (10/08/2022); continues participation as able (11/16/2022);  Goal status: In-progress   2.  Patient  will demonstrate improved FOTO to equal or greater than 67 by visit #12 to demonstrate improvement in overall condition and self-reported functional ability.  Baseline: 44 (09/07/22); 61 at visit #8 (10/01/2022); 60 at visit #20 (11/16/2022);  Goal status: In-progress   3.  Patient will demonstrate L shoulder AROM equal or greater than 130 degrees flexion, 110 degrees abduction,  80 degrees ER at 90 degrees abduction, and IR to L1 to improve his ability to dress, reach overhead, and throw balls.  Baseline: very limited  - see objective (09/07/22); continues to be limited but improving - see objective (10/08/2022);  similar limitations but less painful  - see objective (11/16/2022);  Goal status: In-progress   4.  Patient will demonstrate L shoulder strength equal or greater than 4/5 MMT in flexion, abduction, ER, and IR to improve his ability to reach overhead, throw a ball, and dress. Baseline: testing deferred due to acuity of injury (09/07/22); resistive testing continues to be deferred but see AROM for strength (10/08/2022); lacks AROM but does have good resistance to challenge within available AROM except ER - see above (11/16/2022);  Goal status: In-progress   5.  Patient will complete community, work and/or recreational activities with equal or less than 75% limitation due to current condition.  Baseline: difficulty completing any activities that require use of left UE, showering, washing dishes, dressing, hygiene, working out at [employer] for health, sleeping, housework, cooking, throwing balls, some things he does left handed (09/07/22); has noted improvement with ability to use left UE for activities that do not require overhead motion or heavy force such as putting his shirttail in, holding low on the handle of the elliptical machine, reaching for the car door, washing dishes, flossing; still cannot shampoo hair with left hand (10/08/2022); pt reports he notices more he can do every day (11/16/2022);   Goal status:  In-progress   PLAN:   PT FREQUENCY: 1-2x/week   PT DURATION: 12 weeks   PLANNED INTERVENTIONS: Therapeutic exercises, Therapeutic activity, Neuromuscular re-education, Balance training, Gait training, Patient/Family education, Self Care, Joint mobilization, Dry Needling, Electrical stimulation, Spinal mobilization, Cryotherapy, Moist heat, Manual therapy, and Re-evaluation   PLAN FOR NEXT SESSION: Update HEP as appropriate, progressive PROM, AAROM, strengthening for posture, shoulder girdle, and UE. Manual therapy for pain control, improved ROM.    Cira Rue, PT, DPT 12/17/2022, 12:01 PM  [facility] [address] [place], [place] [address] P: [phone] I F: [phone]

## 2022-12-22 ENCOUNTER — Encounter: Payer: Self-pay | Admitting: Physical Therapy

## 2022-12-22 ENCOUNTER — Ambulatory Visit: Payer: PPO | Admitting: Physical Therapy

## 2022-12-22 DIAGNOSIS — M25512 Pain in left shoulder: Secondary | ICD-10-CM

## 2022-12-22 DIAGNOSIS — M79622 Pain in left upper arm: Secondary | ICD-10-CM

## 2022-12-22 DIAGNOSIS — M6281 Muscle weakness (generalized): Secondary | ICD-10-CM

## 2022-12-22 DIAGNOSIS — M25612 Stiffness of left shoulder, not elsewhere classified: Secondary | ICD-10-CM

## 2022-12-22 NOTE — Therapy (Signed)
OUTPATIENT PHYSICAL THERAPY TREATMENT NOTE / PROGRESS NOTE Dates of reporting from 11/16/2022 to 12/22/2022  Patient Name: Glenn Watson MRN: [id number] DOB:31-Jul-1945, 77 y.o., male Today's Date: 12/22/2022  PCP: Marguarite Arbour, MD REFERRING PROVIDER: Deeann Saint, MD  END OF SESSION:   PT End of Session - 12/22/22 0902     Visit Number 30    Number of Visits 63    Date for PT Re-Evaluation 02/08/23    Authorization Type [employer] reporting period from 12/17/2022    Progress Note Due on Visit 30    PT Start Time 0901    PT Stop Time 0946    PT Time Calculation (min) 45 min    Activity Tolerance Patient tolerated treatment well;Patient limited by fatigue;Patient limited by pain    Behavior During Therapy [facility] for tasks assessed/performed                Past Medical History:  Diagnosis Date   Anemia    Asthma    as a child, exercise asthma - none since high school"   Cancer (HCC)    skin cancer - basal, squamous- Mylenoma - stage 2- chest   Complication of anesthesia    "very sensitive to medications" "Had colonscopy with just Draminine"   Family history of adverse reaction to anesthesia    daughter- N/V   Hyperlipidemia    Hypertension    PONV (postoperative nausea and vomiting)    Past Surgical History:  Procedure Laterality Date   APPENDECTOMY  2011   Dr. Lemar Livings   COLONOSCOPY WITH PROPOFOL N/A 02/10/2017   Procedure: COLONOSCOPY WITH PROPOFOL;  Surgeon: Kieth Brightly, MD;  Location: [facility] ENDOSCOPY;  Service: Endoscopy;  Laterality: N/A;   ORIF TIBIA PLATEAU Left 08/03/2019   Procedure: OPEN REDUCTION INTERNAL FIXATION (ORIF) TIBIAL PLATEAU;  Surgeon: Myrene Galas, MD;  Location: [facility] OR;  Service: Orthopedics;  Laterality: Left;   Patient Active Problem List   Diagnosis Date Noted   Prostate cancer (HCC) 03/04/2021   Hypertension    Hyperlipidemia    Asthma    Tibial plateau fracture, left, closed, with nonunion, subsequent  encounter 08/03/2019   Closed fracture of shaft of tibia 03/28/2019   Retrocalcaneal bursitis, right 04/05/2018   Chronic anemia 11/14/2013   OA (osteoarthritis) 11/14/2013    REFERRING DIAG: 4-part fracture of surgical neck of left humerus  THERAPY DIAG:  Pain in left upper arm  Stiffness of left shoulder, not elsewhere classified  Left shoulder pain, unspecified chronicity  Muscle weakness (generalized)  Rationale for Evaluation and Treatment: Rehabilitation  PERTINENT HISTORY: Patient is a 77 y.o. male who presents to outpatient physical therapy with a referral for medical diagnosis 4-part fracture of surgical neck of left humerus. This patient's chief complaints consist of left upper arm and shoulder pain, shoulder stiffness, and L UE weakness/swelling 2/2 L humeral neck fracture on 07/24/2022 leading to the following functional deficits: difficulty completing any activities that require use of left UE, showering, washing dishes, dressing, hygiene, working out at [employer] for health, sleeping, housework, cooking, throwing balls, some things he does left handed. Relevant past medical history and comorbidities include prostate cancer (dx Fall 2021, 40 radiation treatments in summer 2022, now doing well followed 1x a year), HTN, hyperlipidemia, asthma (as a child), ORIF L tibial Plateau (08/03/2019), OA, appendectomy, skin cancer (removed).  Patient denies hx of stroke, seizures, lung problems, heart problems, diabetes, unexplained weight loss, unexplained changes in bowel or bladder problems, unexplained stumbling  or dropping things, osteoporosis, and spinal surgery   PRECAUTIONS: fracture 07/24/2022  SUBJECTIVE:                                                                                                                                                                                      SUBJECTIVE STATEMENT:  Patient reports he is feeling well today. He states he worked yesterday and there  is a shelf that is about chin high or higher that he is supposed to put ready prescriptions on when they are ready. He was able to put them up there with his left UE. At first it was harder but by the end of the afternoon it was easier. He still feels like he is getting a little better every day. Some days he is not sure it is measurable.  He states he has noticed it is hard to reach down to get something heavy, like a case of water, from the bottom shelf at the grocery store. He has started doing bicep curls last week at the gym and was able to do about 30# bar with B UE.   He will see Dr. Hyacinth Meeker again on 01/04/2023   PAIN:  NPRS 0-1/10 left shoulder/upper arm  OBJECTIVE   SELF-REPORTED FUNCTION FOTO score: 74/100 (upper arm questionnaire)   PERIPHERAL JOINT MOTION (in degrees)   ACTIVE RANGE OF MOTION (AROM) *Indicates pain 09/07/22 10/08/22 11/16/22 12/22/22  Joint/Motion R/L R/L R/L R/L  Shoulder         Flexion 142/50* /70 /73 /78  Extension 72/40* /48* /45 /50  Abduction  155/55* /65* /69* /72  External rotation 60/0 /0 /20 /15  Internal rotation T8/Sacrum* /T12 /T12 /T12  Elbow         Flexion  134/130 /134 /140 /135  Extension  -6/-8 /-4 /-4 /-3  Comments:  09/07/2022: pulling/discomfort pain at left painful movements. Abduction most painful. Compensatory L shoulder hike with ABD>FLEX.  10/08/2022; 11/16/2022: abduction most painful   PASSIVE RANGE OF MOTION (PROM) *Indicates pain 09/07/22 10/08/22 11/16/22 12/22/22  Joint/Motion R/L R/L R/L R/L  Shoulder         Flexion /108* /130* /143* /150*  Extension / / / /  Abduction  /68* /110* /130* /130*  External rotation /28* /18* /45* /15*  Internal rotation /60* /82* /55* /35  Comments:  09/07/2022: flexion and abduction limited by pain/empty end feel with mild guarding. L shoulder ER/IR at 45 degrees scaption.  10/08/2022: L shoulder ER/IR at 90 degrees abduction 11/16/2022: ER/IR at 45 degrees scaption. IR isolated to [facility] joint.  Flexion oin scaption plane sidelying with inferior glide of GHJ.  12/22/2022: pain at  lateral shoulder. ER and IR measured at 90 degrees abduction.    MUSCLE PERFORMANCE (MMT):  *Indicates pain 11/16/22 12/22/22 Date  Joint/Motion R/L R/L R/L  Shoulder        Flexion 5/3+* 5/4 /  Abduction (C5) 5/4 5/4+ /  External rotation 4+/2 4+/2 /  Internal rotation 5/4 5/5 /  Extension / / /  Elbow        Flexion (C6) 5/4 5/4+ /  Extension (C7) 5/4 5/5 /  11/16/2022; 12/22/2022: within available ROM (see AROM above).    Grip strength (in pounds, average of three measures).  09/07/2022:  R: (59+59+65)/3 = 61 L: (34+29+30)/ 3 = 31 10/08/2022:  R: (65+73+65)/3 = 67.7 L: (50+46+46)/ 3 = 47.3 11/16/2022:  R: (67+74+68)/3 = 69.7 L: (54+59+68)/3 = 60.3 12/22/2022:  R: (70+78+72)/3 = 73.3 L: (60+*56+65)/3 = 60.3   TODAY'S TREATMENT:  Therapeutic exercise: to centralize symptoms and improve ROM, strength, muscular endurance, and activity tolerance required for successful completion of functional activities.  - supine B  shoulder flexion with PVC stick loaded with 10#AW, 2x10 with 5 second holds.  - measurements to assess progress (see above).  - standing single arm row, 3x15 L side, 2x15 R side. 10# cable.  - seated supinated bicep curl, 3x15 L side, 2x15 R side. 8# DB.  Pt required multimodal cuing for proper technique and to facilitate improved neuromuscular control, strength, range of motion, and functional ability resulting in improved performance and form.   PATIENT EDUCATION: Education details: Exercise purpose/form. Self management techniques.  Reviewed cancelation/no-show policy with patient and confirmed patient has correct phone number for clinic; patient verbalized understanding (10/01/22). Person educated: Patient Education method: Explanation, Demonstration, Tactile cues, Verbal cues, and Handouts Education comprehension: verbalized understanding, returned demonstration, and needs  further education   HOME EXERCISE PROGRAM: Access Code: [email or web address] URL: [email or web address] Date: 12/22/2022 Prepared by: Norton Blizzard  Exercises - Supine Shoulder Flexion with Dowel  - 1 x daily - 1 sets - 20 reps - 5 seconds hold - Shoulder Flexion Wall Slide with Towel  - 1 x daily - 20 reps - 5 second hold - Seated Shoulder Abduction AAROM with Pulley Behind  - 1 x daily - 1 sets - 20 reps - 5 seconds hold - Seated Shoulder Internal Rotation AAROM with Pulley (Mirrored)  - 1 x daily - 1 sets - 20 reps - 5 seconds hold - Shoulder ER Stretch in Abduction  - 1 x daily - 2-3 sets - 10 reps - 5-10 hold - Standing Single Arm Row with Resistance Thumb Up  - 3-5 x weekly - 3 sets - 10 reps - Shoulder External Rotation with Anchored Resistance  - 3-5 x weekly - 3 sets - 10 reps - Shoulder Internal Rotation with Resistance  - 3-5 x weekly - 3 sets - 10 reps - Sidelying Shoulder Abduction Palm Forward  - 3-5 x weekly - 3 sets - 10 reps - Sidelying Shoulder Horizontal Abduction  - 3-5 x weekly - 3 sets - 10 reps - Seated Single Arm Bicep Curls Supinated with Dumbbell  - 3-5 x weekly - 3 sets - 10-15 reps    ASSESSMENT:   CLINICAL IMPRESSION: Patient has attended 30 physical therapy sessions since starting current episode of care on 09/07/2022. He is now 21 weeks, 4 days s/p R proximal humerus fracture. He has demonstrated some improvement in function, FOTO score, pain, strength within available ROM, and PROM. He has similar AROM to last progress note with  less pain. He continues to show significant deficits in shoulder ER and overhead movement, which suggests RTC tear. Patient would benefit from 10 more visits with focus on strengthening deltoid and increasing strength and function of left shoulder prior to discharge. With less pain, patient is more tolerant of strengthening now. Plan to continue with visit frequency of 2x a week and re-assess for appropriateness for discharge at visit  40 or by 02/08/2023, whichever is first.  Patient would benefit from continued management of limiting condition by skilled physical therapist to address remaining impairments and functional limitations to work towards stated goals and return to PLOF or maximal functional independence.    From initial PT eval 09/07/2022: Patient is a 77 y.o. male referred to outpatient physical therapy with a medical diagnosis of 4-part fracture of surgical neck of left humerus who presents with signs and symptoms consistent with R shoulder pain, stiffness, and weakness, and L UE swelling 2/2 proximal humerus fracture on 07/24/2022 after FOOSH. Patient presents with significant swelling, pain, joint stiffness, guarding, motor control, muscle tension, muscle performance (strength/power/endurance), swelling, tissue integrity, and activity tolerance impairments that are limiting ability to complete any activities that require use of left UE, showering, washing dishes, dressing, hygiene, working out at [employer] for health, sleeping, housework, cooking, throwing balls, some things he does left handed without difficulty. He currently has significant limitations due to pain with movement overhead with empty end feel with flexion and abduction. Patient will benefit from skilled physical therapy intervention to address current body structure impairments and activity limitations to improve function and work towards goals set in current POC in order to return to prior level of function or maximal functional improvement.    OBJECTIVE IMPAIRMENTS: decreased activity tolerance, decreased coordination, decreased endurance, decreased knowledge of condition, decreased mobility, decreased ROM, decreased strength, hypomobility, increased edema, increased fascial restrictions, impaired perceived functional ability, increased muscle spasms, impaired flexibility, impaired UE functional use, improper body mechanics, and pain.    ACTIVITY LIMITATIONS:  carrying, lifting, sleeping, bathing, dressing, reach over head, hygiene/grooming, and caring for others   PARTICIPATION LIMITATIONS: meal prep, cleaning, interpersonal relationship, shopping, community activity, occupation, yard work, and   difficulty completing any activities that require use of left UE, showering, washing dishes, dressing, hygiene, working out at [employer] for health, sleeping, housework, cooking, throwing balls, some things he does left handed   PERSONAL FACTORS: Age, Past/current experiences, Time since onset of injury/illness/exacerbation, and 3+ comorbidities:   prostate cancer (dx Fall 2021, 40 radiation treatments in summer 2022, now doing well followed 1x a year), HTN, hyperlipidemia, asthma (as a child), ORIF L tibial Plateau (08/03/2019), OA, appendectomy, skin cancer (removed) are also affecting patient's functional outcome.    REHAB POTENTIAL: Good   CLINICAL DECISION MAKING: Stable/uncomplicated   EVALUATION COMPLEXITY: Low     GOALS: Goals reviewed with patient? No   SHORT TERM GOALS: Target date: 09/21/2022   Patient will be independent with initial home exercise program for self-management of symptoms. Baseline: Initial HEP provided at IE (09/07/22); Goal status: MET     LONG TERM GOALS: Target date: 11/30/2022  Updated to 02/08/2023 for all unmet goals on 11/16/2022.    Patient will be independent with a long-term home exercise program for self-management of symptoms.  Baseline: Initial HEP provided at IE (09/07/22); excellent participation (10/08/2022); continues participation as able (11/16/2022; 12/22/2022);  Goal status: In-progress   2.  Patient will demonstrate improved FOTO to equal or greater than 67 by visit #12  to demonstrate improvement in overall condition and self-reported functional ability.  Baseline: 44 (09/07/22); 61 at visit #8 (10/01/2022); 60 at visit #20 (11/16/2022); 74 at visit #30 (12/22/2022);  Goal status: In-progress   3.  Patient will  demonstrate L shoulder AROM equal or greater than 130 degrees flexion, 110 degrees abduction,  80 degrees ER at 90 degrees abduction, and IR to L1 to improve his ability to dress, reach overhead, and throw balls.  Baseline: very limited  - see objective (09/07/22); continues to be limited but improving - see objective (10/08/2022);  similar limitations but less painful  - see objective (11/16/2022); similar limitations but no longer painful - see objective (12/22/2022);  Goal status: In-progress   4.  Patient will demonstrate L shoulder strength equal or greater than 4/5 MMT in flexion, abduction, ER, and IR to improve his ability to reach overhead, throw a ball, and dress. Baseline: testing deferred due to acuity of injury (09/07/22); resistive testing continues to be deferred but see AROM for strength (10/08/2022); lacks AROM but does have good resistance to challenge within available AROM except ER - see above (11/16/2022; 12/22/2022);  Goal status: In-progress   5.  Patient will complete community, work and/or recreational activities with equal or less than 75% limitation due to current condition.  Baseline: difficulty completing any activities that require use of left UE, showering, washing dishes, dressing, hygiene, working out at [employer] for health, sleeping, housework, cooking, throwing balls, some things he does left handed (09/07/22); has noted improvement with ability to use left UE for activities that do not require overhead motion or heavy force such as putting his shirttail in, holding low on the handle of the elliptical machine, reaching for the car door, washing dishes, flossing; still cannot shampoo hair with left hand (10/08/2022); pt reports he notices more he can do every day (11/16/2022); patient reports he ocntinues to feel improvement daily (12/22/2022);  Goal status: In-progress   PLAN:   PT FREQUENCY: 1-2x/week   PT DURATION: 12 weeks   PLANNED INTERVENTIONS: Therapeutic exercises,  Therapeutic activity, Neuromuscular re-education, Balance training, Gait training, Patient/Family education, Self Care, Joint mobilization, Dry Needling, Electrical stimulation, Spinal mobilization, Cryotherapy, Moist heat, Manual therapy, and Re-evaluation   PLAN FOR NEXT SESSION: Update HEP as appropriate, progressive PROM, AAROM, strengthening for posture, shoulder girdle, and UE. Manual therapy for pain control, improved ROM.    Cira Rue, PT, DPT 12/22/2022, 10:07 AM  [facility] [address] [place], [place] [address] P: [phone] I F: [phone]

## 2022-12-23 ENCOUNTER — Encounter: Payer: PPO | Admitting: Physical Therapy

## 2022-12-24 ENCOUNTER — Ambulatory Visit: Payer: PPO | Admitting: Physical Therapy

## 2022-12-24 ENCOUNTER — Encounter: Payer: Self-pay | Admitting: Physical Therapy

## 2022-12-24 DIAGNOSIS — M79622 Pain in left upper arm: Secondary | ICD-10-CM | POA: Diagnosis not present

## 2022-12-24 DIAGNOSIS — M25512 Pain in left shoulder: Secondary | ICD-10-CM

## 2022-12-24 DIAGNOSIS — M25612 Stiffness of left shoulder, not elsewhere classified: Secondary | ICD-10-CM

## 2022-12-24 DIAGNOSIS — M6281 Muscle weakness (generalized): Secondary | ICD-10-CM

## 2022-12-24 NOTE — Therapy (Signed)
OUTPATIENT PHYSICAL THERAPY TREATMENT NOTE   Patient Name: Glenn Watson MRN: [id number] DOB:Jun 14, 1946, 77 y.o., male Today's Date: 12/24/2022  PCP: Marguarite Arbour, MD REFERRING PROVIDER: Deeann Saint, MD  END OF SESSION:   PT End of Session - 12/24/22 0905     Visit Number 31    Number of Visits 63    Date for PT Re-Evaluation 02/08/23    Authorization Type [employer] reporting period from 12/17/2022    Progress Note Due on Visit 30    PT Start Time 0901    PT Stop Time 1000    PT Time Calculation (min) 59 min    Activity Tolerance Patient tolerated treatment well;Patient limited by fatigue;Patient limited by pain    Behavior During Therapy [facility] for tasks assessed/performed              Past Medical History:  Diagnosis Date   Anemia    Asthma    as a child, exercise asthma - none since high school"   Cancer (HCC)    skin cancer - basal, squamous- Mylenoma - stage 2- chest   Complication of anesthesia    "very sensitive to medications" "Had colonscopy with just Draminine"   Family history of adverse reaction to anesthesia    daughter- N/V   Hyperlipidemia    Hypertension    PONV (postoperative nausea and vomiting)    Past Surgical History:  Procedure Laterality Date   APPENDECTOMY  2011   Dr. Lemar Livings   COLONOSCOPY WITH PROPOFOL N/A 02/10/2017   Procedure: COLONOSCOPY WITH PROPOFOL;  Surgeon: Kieth Brightly, MD;  Location: [facility] ENDOSCOPY;  Service: Endoscopy;  Laterality: N/A;   ORIF TIBIA PLATEAU Left 08/03/2019   Procedure: OPEN REDUCTION INTERNAL FIXATION (ORIF) TIBIAL PLATEAU;  Surgeon: Myrene Galas, MD;  Location: [facility] OR;  Service: Orthopedics;  Laterality: Left;   Patient Active Problem List   Diagnosis Date Noted   Prostate cancer (HCC) 03/04/2021   Hypertension    Hyperlipidemia    Asthma    Tibial plateau fracture, left, closed, with nonunion, subsequent encounter 08/03/2019   Closed fracture of shaft of tibia 03/28/2019    Retrocalcaneal bursitis, right 04/05/2018   Chronic anemia 11/14/2013   OA (osteoarthritis) 11/14/2013    REFERRING DIAG: 4-part fracture of surgical neck of left humerus  THERAPY DIAG:  Pain in left upper arm  Stiffness of left shoulder, not elsewhere classified  Left shoulder pain, unspecified chronicity  Muscle weakness (generalized)  Rationale for Evaluation and Treatment: Rehabilitation  PERTINENT HISTORY: Patient is a 77 y.o. male who presents to outpatient physical therapy with a referral for medical diagnosis 4-part fracture of surgical neck of left humerus. This patient's chief complaints consist of left upper arm and shoulder pain, shoulder stiffness, and L UE weakness/swelling 2/2 L humeral neck fracture on 07/24/2022 leading to the following functional deficits: difficulty completing any activities that require use of left UE, showering, washing dishes, dressing, hygiene, working out at [employer] for health, sleeping, housework, cooking, throwing balls, some things he does left handed. Relevant past medical history and comorbidities include prostate cancer (dx Fall 2021, 40 radiation treatments in summer 2022, now doing well followed 1x a year), HTN, hyperlipidemia, asthma (as a child), ORIF L tibial Plateau (08/03/2019), OA, appendectomy, skin cancer (removed).  Patient denies hx of stroke, seizures, lung problems, heart problems, diabetes, unexplained weight loss, unexplained changes in bowel or bladder problems, unexplained stumbling or dropping things, osteoporosis, and spinal surgery   PRECAUTIONS: fracture  07/24/2022  SUBJECTIVE:                                                                                                                                                                                      SUBJECTIVE STATEMENT:  Patient reports he is feeling well today and his shoulder is feeling about the same. He has not done his HEP since he worked yesterday.   He will see  Dr. Hyacinth Meeker again on 01/04/2023   PAIN:  NPRS 0-1/10 left shoulder/upper arm  OBJECTIVE   TODAY'S TREATMENT:  Therapeutic exercise: to centralize symptoms and improve ROM, strength, muscular endurance, and activity tolerance required for successful completion of functional activities.  - supine B  shoulder flexion with PVC stick loaded with 10#AW, 2x10 with 5 second holds.  - wall slide AAROM L flexion with chest rotation towards R to stretch towards scaption/abduction at top of ROM, 1x10 with 5 second hold. (Needs help at times from right UE to help get arm up and to bring it down without losing control).  - prone L shoulder extension with scapular retraction and thumb out (ER), AROM 1x10, 2x10 with 2#DB.  - prone L shoulder horizontal abduction with scapular retraction and back of hand towards ceiling(T), AROM  2x10, with 1x10 with 1#DB.  - prone L shoulder scaption (Y), AROM  3x10 - prone L scapula row with 10#DB, 3x10.  - sled push, no weight added 1x25 feet, 20# added 1x25 feet, 50# added 1x25 feet, 70# added, 1x25 feet, 90# added 4x25 feet with standing/ rest every two reps. PT assists turning sled (90# added was most appropriate weight).   Pt required multimodal cuing for proper technique and to facilitate improved neuromuscular control, strength, range of motion, and functional ability resulting in improved performance and form.   PATIENT EDUCATION: Education details: Exercise purpose/form. Self management techniques.  Reviewed cancelation/no-show policy with patient and confirmed patient has correct phone number for clinic; patient verbalized understanding (10/01/22). Person educated: Patient Education method: Explanation, Demonstration, Tactile cues, Verbal cues, and Handouts Education comprehension: verbalized understanding, returned demonstration, and needs further education   HOME EXERCISE PROGRAM: Access Code: [email or web address] URL: [email or web address] Date:  12/22/2022 Prepared by: Norton Blizzard  Exercises - Supine Shoulder Flexion with Dowel  - 1 x daily - 1 sets - 20 reps - 5 seconds hold - Shoulder Flexion Wall Slide with Towel  - 1 x daily - 20 reps - 5 second hold - Seated Shoulder Abduction AAROM with Pulley Behind  - 1 x daily - 1 sets - 20 reps - 5 seconds hold - Seated Shoulder Internal Rotation AAROM with Pulley (Mirrored)  - 1 x daily -  1 sets - 20 reps - 5 seconds hold - Shoulder ER Stretch in Abduction  - 1 x daily - 2-3 sets - 10 reps - 5-10 hold - Standing Single Arm Row with Resistance Thumb Up  - 3-5 x weekly - 3 sets - 10 reps - Shoulder External Rotation with Anchored Resistance  - 3-5 x weekly - 3 sets - 10 reps - Shoulder Internal Rotation with Resistance  - 3-5 x weekly - 3 sets - 10 reps - Sidelying Shoulder Abduction Palm Forward  - 3-5 x weekly - 3 sets - 10 reps - Sidelying Shoulder Horizontal Abduction  - 3-5 x weekly - 3 sets - 10 reps - Seated Single Arm Bicep Curls Supinated with Dumbbell  - 3-5 x weekly - 3 sets - 10-15 reps    ASSESSMENT:   CLINICAL IMPRESSION: Patient arrives with similar discomfort as usual. Today's session intensified focus on strengthening to improve patient's functional ability. Patient continues to have weakness that suggests RTC dysfunction and would benefit from continued strengthening of compensatory muscles such as the deltoid to make progress in functional capacity. Patient would benefit from continued management of limiting condition by skilled physical therapist to address remaining impairments and functional limitations to work towards stated goals and return to PLOF or maximal functional independence.   From initial PT eval 09/07/2022: Patient is a 77 y.o. male referred to outpatient physical therapy with a medical diagnosis of 4-part fracture of surgical neck of left humerus who presents with signs and symptoms consistent with R shoulder pain, stiffness, and weakness, and L UE swelling  2/2 proximal humerus fracture on 07/24/2022 after FOOSH. Patient presents with significant swelling, pain, joint stiffness, guarding, motor control, muscle tension, muscle performance (strength/power/endurance), swelling, tissue integrity, and activity tolerance impairments that are limiting ability to complete any activities that require use of left UE, showering, washing dishes, dressing, hygiene, working out at [employer] for health, sleeping, housework, cooking, throwing balls, some things he does left handed without difficulty. He currently has significant limitations due to pain with movement overhead with empty end feel with flexion and abduction. Patient will benefit from skilled physical therapy intervention to address current body structure impairments and activity limitations to improve function and work towards goals set in current POC in order to return to prior level of function or maximal functional improvement.    OBJECTIVE IMPAIRMENTS: decreased activity tolerance, decreased coordination, decreased endurance, decreased knowledge of condition, decreased mobility, decreased ROM, decreased strength, hypomobility, increased edema, increased fascial restrictions, impaired perceived functional ability, increased muscle spasms, impaired flexibility, impaired UE functional use, improper body mechanics, and pain.    ACTIVITY LIMITATIONS: carrying, lifting, sleeping, bathing, dressing, reach over head, hygiene/grooming, and caring for others   PARTICIPATION LIMITATIONS: meal prep, cleaning, interpersonal relationship, shopping, community activity, occupation, yard work, and   difficulty completing any activities that require use of left UE, showering, washing dishes, dressing, hygiene, working out at [employer] for health, sleeping, housework, cooking, throwing balls, some things he does left handed   PERSONAL FACTORS: Age, Past/current experiences, Time since onset of injury/illness/exacerbation, and 3+  comorbidities:   prostate cancer (dx Fall 2021, 40 radiation treatments in summer 2022, now doing well followed 1x a year), HTN, hyperlipidemia, asthma (as a child), ORIF L tibial Plateau (08/03/2019), OA, appendectomy, skin cancer (removed) are also affecting patient's functional outcome.    REHAB POTENTIAL: Good   CLINICAL DECISION MAKING: Stable/uncomplicated   EVALUATION COMPLEXITY: Low     GOALS: Goals  reviewed with patient? No   SHORT TERM GOALS: Target date: 09/21/2022   Patient will be independent with initial home exercise program for self-management of symptoms. Baseline: Initial HEP provided at IE (09/07/22); Goal status: MET     LONG TERM GOALS: Target date: 11/30/2022  Updated to 02/08/2023 for all unmet goals on 11/16/2022.    Patient will be independent with a long-term home exercise program for self-management of symptoms.  Baseline: Initial HEP provided at IE (09/07/22); excellent participation (10/08/2022); continues participation as able (11/16/2022; 12/22/2022);  Goal status: In-progress   2.  Patient will demonstrate improved FOTO to equal or greater than 67 by visit #12 to demonstrate improvement in overall condition and self-reported functional ability.  Baseline: 44 (09/07/22); 61 at visit #8 (10/01/2022); 60 at visit #20 (11/16/2022); 74 at visit #30 (12/22/2022);  Goal status: In-progress   3.  Patient will demonstrate L shoulder AROM equal or greater than 130 degrees flexion, 110 degrees abduction,  80 degrees ER at 90 degrees abduction, and IR to L1 to improve his ability to dress, reach overhead, and throw balls.  Baseline: very limited  - see objective (09/07/22); continues to be limited but improving - see objective (10/08/2022);  similar limitations but less painful  - see objective (11/16/2022); similar limitations but no longer painful - see objective (12/22/2022);  Goal status: In-progress   4.  Patient will demonstrate L shoulder strength equal or greater than 4/5  MMT in flexion, abduction, ER, and IR to improve his ability to reach overhead, throw a ball, and dress. Baseline: testing deferred due to acuity of injury (09/07/22); resistive testing continues to be deferred but see AROM for strength (10/08/2022); lacks AROM but does have good resistance to challenge within available AROM except ER - see above (11/16/2022; 12/22/2022);  Goal status: In-progress   5.  Patient will complete community, work and/or recreational activities with equal or less than 75% limitation due to current condition.  Baseline: difficulty completing any activities that require use of left UE, showering, washing dishes, dressing, hygiene, working out at [employer] for health, sleeping, housework, cooking, throwing balls, some things he does left handed (09/07/22); has noted improvement with ability to use left UE for activities that do not require overhead motion or heavy force such as putting his shirttail in, holding low on the handle of the elliptical machine, reaching for the car door, washing dishes, flossing; still cannot shampoo hair with left hand (10/08/2022); pt reports he notices more he can do every day (11/16/2022); patient reports he ocntinues to feel improvement daily (12/22/2022);  Goal status: In-progress   PLAN:   PT FREQUENCY: 1-2x/week   PT DURATION: 12 weeks   PLANNED INTERVENTIONS: Therapeutic exercises, Therapeutic activity, Neuromuscular re-education, Balance training, Gait training, Patient/Family education, Self Care, Joint mobilization, Dry Needling, Electrical stimulation, Spinal mobilization, Cryotherapy, Moist heat, Manual therapy, and Re-evaluation   PLAN FOR NEXT SESSION: Update HEP as appropriate, progressive PROM, AAROM, strengthening for posture, shoulder girdle, and UE. Manual therapy for pain control, improved ROM.    Cira Rue, PT, DPT 12/24/2022, 10:14 AM  [facility] [address] [place], [place]  [address] P: [phone] I F: [phone]

## 2022-12-28 ENCOUNTER — Ambulatory Visit: Payer: PPO | Attending: Specialist

## 2022-12-28 DIAGNOSIS — M25512 Pain in left shoulder: Secondary | ICD-10-CM | POA: Insufficient documentation

## 2022-12-28 DIAGNOSIS — M79622 Pain in left upper arm: Secondary | ICD-10-CM | POA: Insufficient documentation

## 2022-12-28 DIAGNOSIS — M6281 Muscle weakness (generalized): Secondary | ICD-10-CM | POA: Insufficient documentation

## 2022-12-28 DIAGNOSIS — M25612 Stiffness of left shoulder, not elsewhere classified: Secondary | ICD-10-CM | POA: Insufficient documentation

## 2022-12-28 NOTE — Therapy (Signed)
OUTPATIENT PHYSICAL THERAPY TREATMENT   Patient Name: Glenn Watson MRN: [id number] DOB:September 20, 1945, 77 y.o., male Today's Date: 12/28/2022  PCP: Marguarite Arbour, MD REFERRING PROVIDER: Deeann Saint, MD  END OF SESSION:   PT End of Session - 12/28/22 1115     Visit Number 32    Number of Visits 63    Date for PT Re-Evaluation 02/08/23    Authorization Type [employer] reporting period from 12/17/2022    Progress Note Due on Visit 40    PT Start Time 1114    PT Stop Time 1158    PT Time Calculation (min) 44 min    Equipment Utilized During Treatment Gait belt    Activity Tolerance Patient tolerated treatment well;Patient limited by fatigue;Patient limited by pain    Behavior During Therapy WFL for tasks assessed/performed              Past Medical History:  Diagnosis Date   Anemia    Asthma    as a child, exercise asthma - none since high school"   Cancer (HCC)    skin cancer - basal, squamous- Mylenoma - stage 2- chest   Complication of anesthesia    "very sensitive to medications" "Had colonscopy with just Draminine"   Family history of adverse reaction to anesthesia    daughter- N/V   Hyperlipidemia    Hypertension    PONV (postoperative nausea and vomiting)    Past Surgical History:  Procedure Laterality Date   APPENDECTOMY  2011   Dr. Lemar Livings   COLONOSCOPY WITH PROPOFOL N/A 02/10/2017   Procedure: COLONOSCOPY WITH PROPOFOL;  Surgeon: Kieth Brightly, MD;  Location: [facility];  Service: Endoscopy;  Laterality: N/A;   ORIF TIBIA PLATEAU Left 08/03/2019   Procedure: OPEN REDUCTION INTERNAL FIXATION (ORIF) TIBIAL PLATEAU;  Surgeon: Myrene Galas, MD;  Location: [facility] OR;  Service: Orthopedics;  Laterality: Left;   Patient Active Problem List   Diagnosis Date Noted   Prostate cancer (HCC) 03/04/2021   Hypertension    Hyperlipidemia    Asthma    Tibial plateau fracture, left, closed, with nonunion, subsequent encounter 08/03/2019   Closed  fracture of shaft of tibia 03/28/2019   Retrocalcaneal bursitis, right 04/05/2018   Chronic anemia 11/14/2013   OA (osteoarthritis) 11/14/2013    REFERRING DIAG: 4-part fracture of surgical neck of left humerus  THERAPY DIAG:  Pain in left upper arm  Stiffness of left shoulder, not elsewhere classified  Left shoulder pain, unspecified chronicity  Muscle weakness (generalized)  Rationale for Evaluation and Treatment: Rehabilitation  PERTINENT HISTORY: Patient is a 77 y.o. male who presents to outpatient physical therapy with a referral for medical diagnosis 4-part fracture of surgical neck of left humerus. This patient's chief complaints consist of left upper arm and shoulder pain, shoulder stiffness, and L UE weakness/swelling 2/2 L humeral neck fracture on 07/24/2022 leading to the following functional deficits: difficulty completing any activities that require use of left UE, showering, washing dishes, dressing, hygiene, working out at [employer] for health, sleeping, housework, cooking, throwing balls, some things he does left handed. Relevant past medical history and comorbidities include prostate cancer (dx Fall 2021, 40 radiation treatments in summer 2022, now doing well followed 1x a year), HTN, hyperlipidemia, asthma (as a child), ORIF L tibial Plateau (08/03/2019), OA, appendectomy, skin cancer (removed).  Patient denies hx of stroke, seizures, lung problems, heart problems, diabetes, unexplained weight loss, unexplained changes in bowel or bladder problems, unexplained stumbling or dropping things,  osteoporosis, and spinal surgery   PRECAUTIONS: fracture 07/24/2022  SUBJECTIVE:                                                                                                                                                                                      SUBJECTIVE STATEMENT:  Still no pain, continues to work on his HEP with consistency at home.  He will see Dr. Hyacinth Meeker again on  01/04/2023   PAIN:  NPRS 0-1/10 left shoulder/upper arm  OBJECTIVE   TODAY'S TREATMENT:  -flexion 'wall slides' at stairs x20, hands on railings, ABDCT 1x10, then 2x35sec -return to performance on door to doorframe (much less labored and painful)  -seated lat bar traction/scaption stretch 2x45 @ 65lb  -standing flexion to ABDCT traction with single grip attachment, 35lb 1x30sec (less comfortable)  -supine B  shoulder 'wand' flexion with iron pipe from sled, 2x10 with 5 second holds.   - prone bilat scapular retraction + slight shoulder extension x15 (0lb)   - prone bilat shoulder horizontal abduction with scapular retraction and back of hand towards ceiling(T) 1x15 (minA to LUE for full range due to weakness)  Chest on 2 pillows  - prone W (unable, severe ER ROM restriction)   -Prone LUE ER stretch (very limited painful) -> Left teres major release (significant reduction in tension, multiple twitch response) -> subscapularis release -seated LLLD setup for LUE ER in 60 degrees flexion and 45 degrees scaption    Pt required multimodal cuing for proper technique and to facilitate improved neuromuscular control, strength, range of motion, and functional ability resulting in improved performance and form.   PATIENT EDUCATION: Education details: Exercise purpose/form. Self management techniques.  Reviewed cancelation/no-show policy with patient and confirmed patient has correct phone number for clinic; patient verbalized understanding (10/01/22). Person educated: Patient Education method: Explanation, Demonstration, Tactile cues, Verbal cues, and Handouts Education comprehension: verbalized understanding, returned demonstration, and needs further education   HOME EXERCISE PROGRAM: Access Code: [email or web address] URL: [email or web address] Date: 12/22/2022 Prepared by: Norton Blizzard  Exercises - Supine Shoulder Flexion with Dowel  - 1 x daily - 1 sets - 20 reps - 5 seconds hold -  Shoulder Flexion Wall Slide with Towel  - 1 x daily - 20 reps - 5 second hold - Seated Shoulder Abduction AAROM with Pulley Behind  - 1 x daily - 1 sets - 20 reps - 5 seconds hold - Seated Shoulder Internal Rotation AAROM with Pulley (Mirrored)  - 1 x daily - 1 sets - 20 reps - 5 seconds hold - Shoulder ER Stretch in Abduction  - 1 x daily - 2-3 sets - 10 reps - 5-10 hold -  Standing Single Arm Row with Resistance Thumb Up  - 3-5 x weekly - 3 sets - 10 reps - Shoulder External Rotation with Anchored Resistance  - 3-5 x weekly - 3 sets - 10 reps - Shoulder Internal Rotation with Resistance  - 3-5 x weekly - 3 sets - 10 reps - Sidelying Shoulder Abduction Palm Forward  - 3-5 x weekly - 3 sets - 10 reps - Sidelying Shoulder Horizontal Abduction  - 3-5 x weekly - 3 sets - 10 reps - Seated Single Arm Bicep Curls Supinated with Dumbbell  - 3-5 x weekly - 3 sets - 10-15 reps    ASSESSMENT:   CLINICAL IMPRESSION: COnitnued with ROM work and Therapist, music. Pt continues to have significant capsular ROM restirctions that likely impose more end-range resistance to active loading than any weights added in yet. Trial of some stretches this date that pair LLLD stretches with GHJ traction rather than compression, appear to be well tolerates and have less intraarticular pinching. Patient would benefit from continued management of limiting condition by skilled physical therapist to address remaining impairments and functional limitations to work towards stated goals and return to PLOF or maximal functional independence.   From initial PT eval 09/07/2022: Patient is a 77 y.o. male referred to outpatient physical therapy with a medical diagnosis of 4-part fracture of surgical neck of left humerus who presents with signs and symptoms consistent with R shoulder pain, stiffness, and weakness, and L UE swelling 2/2 proximal humerus fracture on 07/24/2022 after FOOSH. Patient presents with significant swelling, pain,  joint stiffness, guarding, motor control, muscle tension, muscle performance (strength/power/endurance), swelling, tissue integrity, and activity tolerance impairments that are limiting ability to complete any activities that require use of left UE, showering, washing dishes, dressing, hygiene, working out at [employer] for health, sleeping, housework, cooking, throwing balls, some things he does left handed without difficulty. He currently has significant limitations due to pain with movement overhead with empty end feel with flexion and abduction. Patient will benefit from skilled physical therapy intervention to address current body structure impairments and activity limitations to improve function and work towards goals set in current POC in order to return to prior level of function or maximal functional improvement.    OBJECTIVE IMPAIRMENTS: decreased activity tolerance, decreased coordination, decreased endurance, decreased knowledge of condition, decreased mobility, decreased ROM, decreased strength, hypomobility, increased edema, increased fascial restrictions, impaired perceived functional ability, increased muscle spasms, impaired flexibility, impaired UE functional use, improper body mechanics, and pain.    ACTIVITY LIMITATIONS: carrying, lifting, sleeping, bathing, dressing, reach over head, hygiene/grooming, and caring for others   PARTICIPATION LIMITATIONS: meal prep, cleaning, interpersonal relationship, shopping, community activity, occupation, yard work, and   difficulty completing any activities that require use of left UE, showering, washing dishes, dressing, hygiene, working out at [employer] for health, sleeping, housework, cooking, throwing balls, some things he does left handed   PERSONAL FACTORS: Age, Past/current experiences, Time since onset of injury/illness/exacerbation, and 3+ comorbidities:   prostate cancer (dx Fall 2021, 40 radiation treatments in summer 2022, now doing well  followed 1x a year), HTN, hyperlipidemia, asthma (as a child), ORIF L tibial Plateau (08/03/2019), OA, appendectomy, skin cancer (removed) are also affecting patient's functional outcome.    REHAB POTENTIAL: Good   CLINICAL DECISION MAKING: Stable/uncomplicated   EVALUATION COMPLEXITY: Low     GOALS: Goals reviewed with patient? No   SHORT TERM GOALS: Target date: 09/21/2022   Patient will be independent with initial  home exercise program for self-management of symptoms. Baseline: Initial HEP provided at IE (09/07/22); Goal status: MET     LONG TERM GOALS: Target date: 11/30/2022  Updated to 02/08/2023 for all unmet goals on 11/16/2022.    Patient will be independent with a long-term home exercise program for self-management of symptoms.  Baseline: Initial HEP provided at IE (09/07/22); excellent participation (10/08/2022); continues participation as able (11/16/2022; 12/22/2022);  Goal status: In-progress   2.  Patient will demonstrate improved FOTO to equal or greater than 67 by visit #12 to demonstrate improvement in overall condition and self-reported functional ability.  Baseline: 44 (09/07/22); 61 at visit #8 (10/01/2022); 60 at visit #20 (11/16/2022); 74 at visit #30 (12/22/2022);  Goal status: In-progress   3.  Patient will demonstrate L shoulder AROM equal or greater than 130 degrees flexion, 110 degrees abduction,  80 degrees ER at 90 degrees abduction, and IR to L1 to improve his ability to dress, reach overhead, and throw balls.  Baseline: very limited  - see objective (09/07/22); continues to be limited but improving - see objective (10/08/2022);  similar limitations but less painful  - see objective (11/16/2022); similar limitations but no longer painful - see objective (12/22/2022);  Goal status: In-progress   4.  Patient will demonstrate L shoulder strength equal or greater than 4/5 MMT in flexion, abduction, ER, and IR to improve his ability to reach overhead, throw a ball, and  dress. Baseline: testing deferred due to acuity of injury (09/07/22); resistive testing continues to be deferred but see AROM for strength (10/08/2022); lacks AROM but does have good resistance to challenge within available AROM except ER - see above (11/16/2022; 12/22/2022);  Goal status: In-progress   5.  Patient will complete community, work and/or recreational activities with equal or less than 75% limitation due to current condition.  Baseline: difficulty completing any activities that require use of left UE, showering, washing dishes, dressing, hygiene, working out at [employer] for health, sleeping, housework, cooking, throwing balls, some things he does left handed (09/07/22); has noted improvement with ability to use left UE for activities that do not require overhead motion or heavy force such as putting his shirttail in, holding low on the handle of the elliptical machine, reaching for the car door, washing dishes, flossing; still cannot shampoo hair with left hand (10/08/2022); pt reports he notices more he can do every day (11/16/2022); patient reports he ocntinues to feel improvement daily (12/22/2022);  Goal status: In-progress   PLAN:   PT FREQUENCY: 1-2x/week   PT DURATION: 12 weeks   PLANNED INTERVENTIONS: Therapeutic exercises, Therapeutic activity, Neuromuscular re-education, Balance training, Gait training, Patient/Family education, Self Care, Joint mobilization, Dry Needling, Electrical stimulation, Spinal mobilization, Cryotherapy, Moist heat, Manual therapy, and Re-evaluation   PLAN FOR NEXT SESSION: Update HEP as appropriate, progressive PROM, AAROM, strengthening for posture, shoulder girdle, and UE. Manual therapy for pain control, improved ROM.    Aaryan Essman C, PT, DPT 12/28/2022, 11:19 AM  11:20 AM, 12/28/22 Rosamaria Lints, PT, DPT Physical Therapist - [place] [phone] (Office)   [facility] [address] [place],  [place] [address] P: [phone] I F: [phone]

## 2022-12-30 ENCOUNTER — Encounter: Payer: PPO | Admitting: Physical Therapy

## 2023-01-04 ENCOUNTER — Encounter: Payer: Self-pay | Admitting: Physical Therapy

## 2023-01-04 ENCOUNTER — Ambulatory Visit: Payer: PPO | Admitting: Physical Therapy

## 2023-01-04 DIAGNOSIS — M6281 Muscle weakness (generalized): Secondary | ICD-10-CM

## 2023-01-04 DIAGNOSIS — M25512 Pain in left shoulder: Secondary | ICD-10-CM

## 2023-01-04 DIAGNOSIS — M25612 Stiffness of left shoulder, not elsewhere classified: Secondary | ICD-10-CM

## 2023-01-04 DIAGNOSIS — M79622 Pain in left upper arm: Secondary | ICD-10-CM

## 2023-01-04 DIAGNOSIS — S42242A 4-part fracture of surgical neck of left humerus, initial encounter for closed fracture: Secondary | ICD-10-CM | POA: Diagnosis not present

## 2023-01-04 NOTE — Therapy (Signed)
OUTPATIENT PHYSICAL THERAPY TREATMENT   Patient Name: Glenn Watson MRN: [id number] DOB:12-11-1945, 77 y.o., male Today's Date: 01/04/2023  PCP: Marguarite Arbour, MD REFERRING PROVIDER: Deeann Saint, MD  END OF SESSION:   PT End of Session - 01/04/23 1116     Visit Number 33    Number of Visits 63    Date for PT Re-Evaluation 02/08/23    Authorization Type [employer] reporting period from 12/17/2022    Progress Note Due on Visit 40    PT Start Time 1116    PT Stop Time 1205    PT Time Calculation (min) 49 min    Equipment Utilized During Treatment --    Activity Tolerance Patient tolerated treatment well;Patient limited by fatigue;Patient limited by pain    Behavior During Therapy WFL for tasks assessed/performed               Past Medical History:  Diagnosis Date   Anemia    Asthma    as a child, exercise asthma - none since high school"   Cancer (HCC)    skin cancer - basal, squamous- Mylenoma - stage 2- chest   Complication of anesthesia    "very sensitive to medications" "Had colonscopy with just Draminine"   Family history of adverse reaction to anesthesia    daughter- N/V   Hyperlipidemia    Hypertension    PONV (postoperative nausea and vomiting)    Past Surgical History:  Procedure Laterality Date   APPENDECTOMY  2011   Dr. Lemar Livings   COLONOSCOPY WITH PROPOFOL N/A 02/10/2017   Procedure: COLONOSCOPY WITH PROPOFOL;  Surgeon: Kieth Brightly, MD;  Location: [facility];  Service: Endoscopy;  Laterality: N/A;   ORIF TIBIA PLATEAU Left 08/03/2019   Procedure: OPEN REDUCTION INTERNAL FIXATION (ORIF) TIBIAL PLATEAU;  Surgeon: Myrene Galas, MD;  Location: [facility] OR;  Service: Orthopedics;  Laterality: Left;   Patient Active Problem List   Diagnosis Date Noted   Prostate cancer (HCC) 03/04/2021   Hypertension    Hyperlipidemia    Asthma    Tibial plateau fracture, left, closed, with nonunion, subsequent encounter 08/03/2019   Closed  fracture of shaft of tibia 03/28/2019   Retrocalcaneal bursitis, right 04/05/2018   Chronic anemia 11/14/2013   OA (osteoarthritis) 11/14/2013    REFERRING DIAG: 4-part fracture of surgical neck of left humerus  THERAPY DIAG:  Pain in left upper arm  Stiffness of left shoulder, not elsewhere classified  Left shoulder pain, unspecified chronicity  Muscle weakness (generalized)  Rationale for Evaluation and Treatment: Rehabilitation  PERTINENT HISTORY: Patient is a 77 y.o. male who presents to outpatient physical therapy with a referral for medical diagnosis 4-part fracture of surgical neck of left humerus. This patient's chief complaints consist of left upper arm and shoulder pain, shoulder stiffness, and L UE weakness/swelling 2/2 L humeral neck fracture on 07/24/2022 leading to the following functional deficits: difficulty completing any activities that require use of left UE, showering, washing dishes, dressing, hygiene, working out at [employer] for health, sleeping, housework, cooking, throwing balls, some things he does left handed. Relevant past medical history and comorbidities include prostate cancer (dx Fall 2021, 40 radiation treatments in summer 2022, now doing well followed 1x a year), HTN, hyperlipidemia, asthma (as a child), ORIF L tibial Plateau (08/03/2019), OA, appendectomy, skin cancer (removed).  Patient denies hx of stroke, seizures, lung problems, heart problems, diabetes, unexplained weight loss, unexplained changes in bowel or bladder problems, unexplained stumbling or dropping things,  osteoporosis, and spinal surgery   PRECAUTIONS: fracture 07/24/2022  SUBJECTIVE:                                                                                                                                                                                      SUBJECTIVE STATEMENT:  Patient states he is feeling well today. He states he did lots of exercise today and he measured the differences  between his left and right thumb up his back in FIR. He states he has about 3 inches difference between the sides. He states he is pretty sore today because he usually doesn't do the exercises the day before PT.  Dr. Hyacinth Meeker today at 1:15pm. He liked the banister exercise from last PT session.    PAIN:  NPRS 0-1/10 left shoulder/upper arm  OBJECTIVE   TODAY'S TREATMENT:  Therapeutic exercise: to centralize symptoms and improve ROM, strength, muscular endurance, and activity tolerance required for successful completion of functional activities.  -standing L shoulder AAROM at stair banister, flexion and abduction, 1x20 each direction.  -standing L shoulder scaption traction with single grip attachment (TRX),1x10 with 5 second hold. -standing L shoulder abduction traction with single grip attachment (TRX),1x10 with 5 second hold.   - wall slide AAROM L flexion with chest rotation towards R to stretch towards scaption/abduction at top of ROM, 1x10 with 5 second hold. (Needs help at times from right UE to help get arm up and to bring it down without losing control).   Circuit (prone with left UE hanging off edge of plinth, one pillow under head/chest):  - prone L shoulder extension with scapular retraction and thumb out (ER), 3x10 with 2/3/4#DB.  - prone L shoulder horizontal abduction with scapular retraction and back of hand towards ceiling(T), AROM  1x10, with 2x10 with 1#DB.  - prone L shoulder scaption (Y), AROM  3x10 - prone L scapular row with 10#DB, 3x10.   Manual therapy: to reduce pain and tissue tension, improve range of motion, neuromodulation, in order to promote improved ability to complete functional activities. SUPINE - STM to left subscapularis, teres major, and lat with intermittent PROM L shoulder ER and scaption/flexion.    Pt required multimodal cuing for proper technique and to facilitate improved neuromuscular control, strength, range of motion, and functional ability  resulting in improved performance and form.   PATIENT EDUCATION: Education details: Exercise purpose/form. Self management techniques.  Reviewed cancelation/no-show policy with patient and confirmed patient has correct phone number for clinic; patient verbalized understanding (10/01/22). Person educated: Patient Education method: Explanation, Demonstration, Tactile cues, Verbal cues, and Handouts Education comprehension: verbalized understanding, returned demonstration, and needs further education   HOME  EXERCISE PROGRAM: Access Code: [email or web address] URL: [email or web address] Date: 12/22/2022 Prepared by: Norton Blizzard  Exercises - Supine Shoulder Flexion with Dowel  - 1 x daily - 1 sets - 20 reps - 5 seconds hold - Shoulder Flexion Wall Slide with Towel  - 1 x daily - 20 reps - 5 second hold - Seated Shoulder Abduction AAROM with Pulley Behind  - 1 x daily - 1 sets - 20 reps - 5 seconds hold - Seated Shoulder Internal Rotation AAROM with Pulley (Mirrored)  - 1 x daily - 1 sets - 20 reps - 5 seconds hold - Shoulder ER Stretch in Abduction  - 1 x daily - 2-3 sets - 10 reps - 5-10 hold - Standing Single Arm Row with Resistance Thumb Up  - 3-5 x weekly - 3 sets - 10 reps - Shoulder External Rotation with Anchored Resistance  - 3-5 x weekly - 3 sets - 10 reps - Shoulder Internal Rotation with Resistance  - 3-5 x weekly - 3 sets - 10 reps - Sidelying Shoulder Abduction Palm Forward  - 3-5 x weekly - 3 sets - 10 reps - Sidelying Shoulder Horizontal Abduction  - 3-5 x weekly - 3 sets - 10 reps - Seated Single Arm Bicep Curls Supinated with Dumbbell  - 3-5 x weekly - 3 sets - 10-15 reps    ASSESSMENT:   CLINICAL IMPRESSION: Patient arrives reporting continued low level of discomfort. Continued with focus on deltoid and shoulder girdle strengthening with interventions for ROM as tolerated. Patient with significant ROM restrictions but also unable to utilize available ROM due to weakness.  Weights added in motions where added weights doe not cause decrease in ROM for optimal strengthening stimulus. Patient did seem to be tighter in L shoulder overhead motion today and recommend increased focus on this next session. Patient most limited in overhead and ER motions. Patient would benefit from continued management of limiting condition by skilled physical therapist to address remaining impairments and functional limitations to work towards stated goals and return to PLOF or maximal functional independence.   From initial PT eval 09/07/2022: Patient is a 77 y.o. male referred to outpatient physical therapy with a medical diagnosis of 4-part fracture of surgical neck of left humerus who presents with signs and symptoms consistent with R shoulder pain, stiffness, and weakness, and L UE swelling 2/2 proximal humerus fracture on 07/24/2022 after FOOSH. Patient presents with significant swelling, pain, joint stiffness, guarding, motor control, muscle tension, muscle performance (strength/power/endurance), swelling, tissue integrity, and activity tolerance impairments that are limiting ability to complete any activities that require use of left UE, showering, washing dishes, dressing, hygiene, working out at [employer] for health, sleeping, housework, cooking, throwing balls, some things he does left handed without difficulty. He currently has significant limitations due to pain with movement overhead with empty end feel with flexion and abduction. Patient will benefit from skilled physical therapy intervention to address current body structure impairments and activity limitations to improve function and work towards goals set in current POC in order to return to prior level of function or maximal functional improvement.    OBJECTIVE IMPAIRMENTS: decreased activity tolerance, decreased coordination, decreased endurance, decreased knowledge of condition, decreased mobility, decreased ROM, decreased strength,  hypomobility, increased edema, increased fascial restrictions, impaired perceived functional ability, increased muscle spasms, impaired flexibility, impaired UE functional use, improper body mechanics, and pain.    ACTIVITY LIMITATIONS: carrying, lifting, sleeping, bathing, dressing, reach over head, hygiene/grooming, and caring for others  PARTICIPATION LIMITATIONS: meal prep, cleaning, interpersonal relationship, shopping, community activity, occupation, yard work, and   difficulty completing any activities that require use of left UE, showering, washing dishes, dressing, hygiene, working out at [employer] for health, sleeping, housework, cooking, throwing balls, some things he does left handed   PERSONAL FACTORS: Age, Past/current experiences, Time since onset of injury/illness/exacerbation, and 3+ comorbidities:   prostate cancer (dx Fall 2021, 40 radiation treatments in summer 2022, now doing well followed 1x a year), HTN, hyperlipidemia, asthma (as a child), ORIF L tibial Plateau (08/03/2019), OA, appendectomy, skin cancer (removed) are also affecting patient's functional outcome.    REHAB POTENTIAL: Good   CLINICAL DECISION MAKING: Stable/uncomplicated   EVALUATION COMPLEXITY: Low     GOALS: Goals reviewed with patient? No   SHORT TERM GOALS: Target date: 09/21/2022   Patient will be independent with initial home exercise program for self-management of symptoms. Baseline: Initial HEP provided at IE (09/07/22); Goal status: MET     LONG TERM GOALS: Target date: 11/30/2022  Updated to 02/08/2023 for all unmet goals on 11/16/2022.    Patient will be independent with a long-term home exercise program for self-management of symptoms.  Baseline: Initial HEP provided at IE (09/07/22); excellent participation (10/08/2022); continues participation as able (11/16/2022; 12/22/2022);  Goal status: In-progress   2.  Patient will demonstrate improved FOTO to equal or greater than 67 by visit #12 to  demonstrate improvement in overall condition and self-reported functional ability.  Baseline: 44 (09/07/22); 61 at visit #8 (10/01/2022); 60 at visit #20 (11/16/2022); 74 at visit #30 (12/22/2022);  Goal status: In-progress   3.  Patient will demonstrate L shoulder AROM equal or greater than 130 degrees flexion, 110 degrees abduction,  80 degrees ER at 90 degrees abduction, and IR to L1 to improve his ability to dress, reach overhead, and throw balls.  Baseline: very limited  - see objective (09/07/22); continues to be limited but improving - see objective (10/08/2022);  similar limitations but less painful  - see objective (11/16/2022); similar limitations but no longer painful - see objective (12/22/2022);  Goal status: In-progress   4.  Patient will demonstrate L shoulder strength equal or greater than 4/5 MMT in flexion, abduction, ER, and IR to improve his ability to reach overhead, throw a ball, and dress. Baseline: testing deferred due to acuity of injury (09/07/22); resistive testing continues to be deferred but see AROM for strength (10/08/2022); lacks AROM but does have good resistance to challenge within available AROM except ER - see above (11/16/2022; 12/22/2022);  Goal status: In-progress   5.  Patient will complete community, work and/or recreational activities with equal or less than 75% limitation due to current condition.  Baseline: difficulty completing any activities that require use of left UE, showering, washing dishes, dressing, hygiene, working out at [employer] for health, sleeping, housework, cooking, throwing balls, some things he does left handed (09/07/22); has noted improvement with ability to use left UE for activities that do not require overhead motion or heavy force such as putting his shirttail in, holding low on the handle of the elliptical machine, reaching for the car door, washing dishes, flossing; still cannot shampoo hair with left hand (10/08/2022); pt reports he notices more  he can do every day (11/16/2022); patient reports he ocntinues to feel improvement daily (12/22/2022);  Goal status: In-progress   PLAN:   PT FREQUENCY: 1-2x/week   PT DURATION: 12 weeks   PLANNED INTERVENTIONS: Therapeutic exercises, Therapeutic activity, Neuromuscular re-education,  Balance training, Gait training, Patient/Family education, Self Care, Joint mobilization, Dry Needling, Electrical stimulation, Spinal mobilization, Cryotherapy, Moist heat, Manual therapy, and Re-evaluation   PLAN FOR NEXT SESSION: Update HEP as appropriate, progressive PROM, AAROM, strengthening for posture, shoulder girdle, and UE. Manual therapy for pain control, improved ROM.    Cira Rue, PT, DPT 01/04/2023, 12:07 PM   [facility] [address] [place], [place] [address] P: [phone] I F: [phone]

## 2023-01-06 ENCOUNTER — Encounter: Payer: Self-pay | Admitting: Physical Therapy

## 2023-01-06 ENCOUNTER — Ambulatory Visit: Payer: PPO | Admitting: Physical Therapy

## 2023-01-06 DIAGNOSIS — M25512 Pain in left shoulder: Secondary | ICD-10-CM

## 2023-01-06 DIAGNOSIS — M79622 Pain in left upper arm: Secondary | ICD-10-CM

## 2023-01-06 DIAGNOSIS — M6281 Muscle weakness (generalized): Secondary | ICD-10-CM

## 2023-01-06 DIAGNOSIS — M25612 Stiffness of left shoulder, not elsewhere classified: Secondary | ICD-10-CM

## 2023-01-06 NOTE — Therapy (Signed)
OUTPATIENT PHYSICAL THERAPY TREATMENT   Patient Name: Glenn Watson MRN: [id number] DOB:[birth year], 77 y.o., male Today's Date: 01/06/2023  PCP: Marguarite Arbour, MD REFERRING PROVIDER: Deeann Saint, MD  END OF SESSION:   PT End of Session - 01/06/23 1125     Visit Number 34    Number of Visits 63    Date for PT Re-Evaluation 02/08/23    Authorization Type [employer] reporting period from 12/17/2022    Progress Note Due on Visit 40    PT Start Time 1119    PT Stop Time 1207    PT Time Calculation (min) 48 min    Activity Tolerance Patient tolerated treatment well;Patient limited by fatigue;Patient limited by pain    Behavior During Therapy [facility] for tasks assessed/performed                Past Medical History:  Diagnosis Date   Anemia    Asthma    as a child, exercise asthma - none since high school"   Cancer (HCC)    skin cancer - basal, squamous- Mylenoma - stage 2- chest   Complication of anesthesia    "very sensitive to medications" "Had colonscopy with just Draminine"   Family history of adverse reaction to anesthesia    daughter- N/V   Hyperlipidemia    Hypertension    PONV (postoperative nausea and vomiting)    Past Surgical History:  Procedure Laterality Date   APPENDECTOMY  2011   Dr. Lemar Livings   COLONOSCOPY WITH PROPOFOL N/A 02/10/2017   Procedure: COLONOSCOPY WITH PROPOFOL;  Surgeon: Kieth Brightly, MD;  Location: [facility] ENDOSCOPY;  Service: Endoscopy;  Laterality: N/A;   ORIF TIBIA PLATEAU Left 08/03/2019   Procedure: OPEN REDUCTION INTERNAL FIXATION (ORIF) TIBIAL PLATEAU;  Surgeon: Myrene Galas, MD;  Location: [facility] OR;  Service: Orthopedics;  Laterality: Left;   Patient Active Problem List   Diagnosis Date Noted   Prostate cancer (HCC) 03/04/2021   Hypertension    Hyperlipidemia    Asthma    Tibial plateau fracture, left, closed, with nonunion, subsequent encounter 08/03/2019   Closed fracture of shaft of tibia 03/28/2019    Retrocalcaneal bursitis, right 04/05/2018   Chronic anemia 11/14/2013   OA (osteoarthritis) 11/14/2013    REFERRING DIAG: 4-part fracture of surgical neck of left humerus  THERAPY DIAG:  Pain in left upper arm  Stiffness of left shoulder, not elsewhere classified  Left shoulder pain, unspecified chronicity  Muscle weakness (generalized)  Rationale for Evaluation and Treatment: Rehabilitation  PERTINENT HISTORY: Patient is a 77 y.o. male who presents to outpatient physical therapy with a referral for medical diagnosis 4-part fracture of surgical neck of left humerus. This patient's chief complaints consist of left upper arm and shoulder pain, shoulder stiffness, and L UE weakness/swelling 2/2 L humeral neck fracture on 07/24/2022 leading to the following functional deficits: difficulty completing any activities that require use of left UE, showering, washing dishes, dressing, hygiene, working out at [employer] for health, sleeping, housework, cooking, throwing balls, some things he does left handed. Relevant past medical history and comorbidities include prostate cancer (dx Fall 2021, 40 radiation treatments in summer 2022, now doing well followed 1x a year), HTN, hyperlipidemia, asthma (as a child), ORIF L tibial Plateau (08/03/2019), OA, appendectomy, skin cancer (removed).  Patient denies hx of stroke, seizures, lung problems, heart problems, diabetes, unexplained weight loss, unexplained changes in bowel or bladder problems, unexplained stumbling or dropping things, osteoporosis, and spinal surgery   PRECAUTIONS:  fracture 07/24/2022  SUBJECTIVE:                                                                                                                                                                                      SUBJECTIVE STATEMENT:  Patient states he is feeling well today. He had no excessive soreness after last PT session. He saw Dr. Hyacinth Meeker since last PT session who did an xray that  shows his fracture is "completely healed" and Dr. Hyacinth Meeker released him from needing to come to follow ups.   PAIN:  NPRS 0-1/10 left shoulder/upper arm  OBJECTIVE   TODAY'S TREATMENT:  Therapeutic exercise: to centralize symptoms and improve ROM, strength, muscular endurance, and activity tolerance required for successful completion of functional activities.  - Upper body ergometer with no added resistance encourage joint nutrition, warm tissue, induce analgesic effect of aerobic exercise, improve muscular strength and endurance,  and prepare for remainder of session. 5 min switching directions every 1 minute.  Seat position 9 - supine AAROM L shoulder flexion with PVC stick loaded with 10#AW, 2x10 (manual therapy performed between sets, felt more ROM and increased pain after manual therapy).  - wall slide AAROM L flexion with 5 second hold. (Needs help at times from right UE to help get arm up and to bring it down without losing control). 1x5 - wall slide AAROM with B UE in pillow case, 1x10 with 5 second holds. On wall not doorway.   Circuit (prone with left UE hanging off edge of plinth, one pillow under head/chest):  - prone L shoulder extension with scapular retraction and thumb out (ER), 2x10 with 4#DB.  - prone L shoulder horizontal abduction with scapular retraction and back of hand towards ceiling(T), 2x10 with 1/2#DB.  - prone L shoulder scaption (Y), AROM  2x10 - prone L scapular row with 10#DB, 2x10.   Manual therapy: to reduce pain and tissue tension, improve range of motion, neuromodulation, in order to promote improved ability to complete functional activities. SUPINE - STM to left subscapularis, teres major, and lat with intermittent PROM L shoulder ER and scaption/flexion.    Pt required multimodal cuing for proper technique and to facilitate improved neuromuscular control, strength, range of motion, and functional ability resulting in improved performance and form.    PATIENT EDUCATION: Education details: Exercise purpose/form. Self management techniques.  Reviewed cancelation/no-show policy with patient and confirmed patient has correct phone number for clinic; patient verbalized understanding (10/01/22). Person educated: Patient Education method: Explanation, Demonstration, Tactile cues, Verbal cues, and Handouts Education comprehension: verbalized understanding, returned demonstration, and needs further education   HOME EXERCISE PROGRAM: Access Code: [email or web address] URL: [email or web address] Date:  12/22/2022 Prepared by: Norton Blizzard  Exercises - Supine Shoulder Flexion with Dowel  - 1 x daily - 1 sets - 20 reps - 5 seconds hold - Shoulder Flexion Wall Slide with Towel  - 1 x daily - 20 reps - 5 second hold - Seated Shoulder Abduction AAROM with Pulley Behind  - 1 x daily - 1 sets - 20 reps - 5 seconds hold - Seated Shoulder Internal Rotation AAROM with Pulley (Mirrored)  - 1 x daily - 1 sets - 20 reps - 5 seconds hold - Shoulder ER Stretch in Abduction  - 1 x daily - 2-3 sets - 10 reps - 5-10 hold - Standing Single Arm Row with Resistance Thumb Up  - 3-5 x weekly - 3 sets - 10 reps - Shoulder External Rotation with Anchored Resistance  - 3-5 x weekly - 3 sets - 10 reps - Shoulder Internal Rotation with Resistance  - 3-5 x weekly - 3 sets - 10 reps - Sidelying Shoulder Abduction Palm Forward  - 3-5 x weekly - 3 sets - 10 reps - Sidelying Shoulder Horizontal Abduction  - 3-5 x weekly - 3 sets - 10 reps - Seated Single Arm Bicep Curls Supinated with Dumbbell  - 3-5 x weekly - 3 sets - 10-15 reps    ASSESSMENT:   CLINICAL IMPRESSION: Patient arrives reporting Dr. Hyacinth Meeker said the xray showed his fracture is completely healed and he no longer needs to return to him for follow ups but that he could continue with PT as long as patient wants to go. Patient continues to have a lot of stiffness and weakness that is limiting him in improving  function. Today's session focused on improving stiffness in the joint followed by strengthening to maximize ability to achieve usable ROM. Patient had a lot of pain during motion overhead, that resolved with rest. Patient would benefit from continued management of limiting condition by skilled physical therapist to address remaining impairments and functional limitations to work towards stated goals and return to PLOF or maximal functional independence.   From initial PT eval 09/07/2022: Patient is a 77 y.o. male referred to outpatient physical therapy with a medical diagnosis of 4-part fracture of surgical neck of left humerus who presents with signs and symptoms consistent with R shoulder pain, stiffness, and weakness, and L UE swelling 2/2 proximal humerus fracture on 07/24/2022 after FOOSH. Patient presents with significant swelling, pain, joint stiffness, guarding, motor control, muscle tension, muscle performance (strength/power/endurance), swelling, tissue integrity, and activity tolerance impairments that are limiting ability to complete any activities that require use of left UE, showering, washing dishes, dressing, hygiene, working out at [employer] for health, sleeping, housework, cooking, throwing balls, some things he does left handed without difficulty. He currently has significant limitations due to pain with movement overhead with empty end feel with flexion and abduction. Patient will benefit from skilled physical therapy intervention to address current body structure impairments and activity limitations to improve function and work towards goals set in current POC in order to return to prior level of function or maximal functional improvement.    OBJECTIVE IMPAIRMENTS: decreased activity tolerance, decreased coordination, decreased endurance, decreased knowledge of condition, decreased mobility, decreased ROM, decreased strength, hypomobility, increased edema, increased fascial restrictions,  impaired perceived functional ability, increased muscle spasms, impaired flexibility, impaired UE functional use, improper body mechanics, and pain.    ACTIVITY LIMITATIONS: carrying, lifting, sleeping, bathing, dressing, reach over head, hygiene/grooming, and caring for others   PARTICIPATION  LIMITATIONS: meal prep, cleaning, interpersonal relationship, shopping, community activity, occupation, yard work, and   difficulty completing any activities that require use of left UE, showering, washing dishes, dressing, hygiene, working out at [employer] for health, sleeping, housework, cooking, throwing balls, some things he does left handed   PERSONAL FACTORS: Age, Past/current experiences, Time since onset of injury/illness/exacerbation, and 3+ comorbidities:   prostate cancer (dx Fall 2021, 40 radiation treatments in summer 2022, now doing well followed 1x a year), HTN, hyperlipidemia, asthma (as a child), ORIF L tibial Plateau (08/03/2019), OA, appendectomy, skin cancer (removed) are also affecting patient's functional outcome.    REHAB POTENTIAL: Good   CLINICAL DECISION MAKING: Stable/uncomplicated   EVALUATION COMPLEXITY: Low     GOALS: Goals reviewed with patient? No   SHORT TERM GOALS: Target date: 09/21/2022   Patient will be independent with initial home exercise program for self-management of symptoms. Baseline: Initial HEP provided at IE (09/07/22); Goal status: MET     LONG TERM GOALS: Target date: 11/30/2022  Updated to 02/08/2023 for all unmet goals on 11/16/2022.    Patient will be independent with a long-term home exercise program for self-management of symptoms.  Baseline: Initial HEP provided at IE (09/07/22); excellent participation (10/08/2022); continues participation as able (11/16/2022; 12/22/2022);  Goal status: In-progress   2.  Patient will demonstrate improved FOTO to equal or greater than 67 by visit #12 to demonstrate improvement in overall condition and self-reported  functional ability.  Baseline: 44 (09/07/22); 61 at visit #8 (10/01/2022); 60 at visit #20 (11/16/2022); 74 at visit #30 (12/22/2022);  Goal status: In-progress   3.  Patient will demonstrate L shoulder AROM equal or greater than 130 degrees flexion, 110 degrees abduction,  80 degrees ER at 90 degrees abduction, and IR to L1 to improve his ability to dress, reach overhead, and throw balls.  Baseline: very limited  - see objective (09/07/22); continues to be limited but improving - see objective (10/08/2022);  similar limitations but less painful  - see objective (11/16/2022); similar limitations but no longer painful - see objective (12/22/2022);  Goal status: In-progress   4.  Patient will demonstrate L shoulder strength equal or greater than 4/5 MMT in flexion, abduction, ER, and IR to improve his ability to reach overhead, throw a ball, and dress. Baseline: testing deferred due to acuity of injury (09/07/22); resistive testing continues to be deferred but see AROM for strength (10/08/2022); lacks AROM but does have good resistance to challenge within available AROM except ER - see above (11/16/2022; 12/22/2022);  Goal status: In-progress   5.  Patient will complete community, work and/or recreational activities with equal or less than 75% limitation due to current condition.  Baseline: difficulty completing any activities that require use of left UE, showering, washing dishes, dressing, hygiene, working out at [employer] for health, sleeping, housework, cooking, throwing balls, some things he does left handed (09/07/22); has noted improvement with ability to use left UE for activities that do not require overhead motion or heavy force such as putting his shirttail in, holding low on the handle of the elliptical machine, reaching for the car door, washing dishes, flossing; still cannot shampoo hair with left hand (10/08/2022); pt reports he notices more he can do every day (11/16/2022); patient reports he ocntinues  to feel improvement daily (12/22/2022);  Goal status: In-progress   PLAN:   PT FREQUENCY: 1-2x/week   PT DURATION: 12 weeks   PLANNED INTERVENTIONS: Therapeutic exercises, Therapeutic activity, Neuromuscular re-education, Balance  training, Gait training, Patient/Family education, Self Care, Joint mobilization, Dry Needling, Electrical stimulation, Spinal mobilization, Cryotherapy, Moist heat, Manual therapy, and Re-evaluation   PLAN FOR NEXT SESSION: Update HEP as appropriate, progressive PROM, AAROM, strengthening for posture, shoulder girdle, and UE. Manual therapy for pain control, improved ROM.    Cira Rue, PT, DPT 01/06/2023, 12:09 PM   [facility] [address] [place], [place] [address] P: [phone] I F: [phone]

## 2023-01-12 ENCOUNTER — Encounter: Payer: Self-pay | Admitting: Physical Therapy

## 2023-01-12 ENCOUNTER — Ambulatory Visit: Payer: PPO | Admitting: Physical Therapy

## 2023-01-12 DIAGNOSIS — M6281 Muscle weakness (generalized): Secondary | ICD-10-CM

## 2023-01-12 DIAGNOSIS — M25612 Stiffness of left shoulder, not elsewhere classified: Secondary | ICD-10-CM

## 2023-01-12 DIAGNOSIS — M79622 Pain in left upper arm: Secondary | ICD-10-CM

## 2023-01-12 DIAGNOSIS — M25512 Pain in left shoulder: Secondary | ICD-10-CM

## 2023-01-12 NOTE — Therapy (Signed)
OUTPATIENT PHYSICAL THERAPY TREATMENT   Patient Name: Glenn Watson MRN: [id number] DOB:Feb 04, 1946, 77 y.o., male Today's Date: 01/12/2023  PCP: Marguarite Arbour, MD REFERRING PROVIDER: Deeann Saint, MD  END OF SESSION:   PT End of Session - 01/12/23 1117     Visit Number 35    Number of Visits 63    Date for PT Re-Evaluation 02/08/23    Authorization Type [employer] reporting period from 12/17/2022    Progress Note Due on Visit 40    PT Start Time 1116    PT Stop Time 1200    PT Time Calculation (min) 44 min    Activity Tolerance Patient tolerated treatment well;Patient limited by fatigue;Patient limited by pain    Behavior During Therapy WFL for tasks assessed/performed                 Past Medical History:  Diagnosis Date   Anemia    Asthma    as a child, exercise asthma - none since high school"   Cancer (HCC)    skin cancer - basal, squamous- Mylenoma - stage 2- chest   Complication of anesthesia    "very sensitive to medications" "Had colonscopy with just Draminine"   Family history of adverse reaction to anesthesia    daughter- N/V   Hyperlipidemia    Hypertension    PONV (postoperative nausea and vomiting)    Past Surgical History:  Procedure Laterality Date   APPENDECTOMY  2011   Dr. Lemar Livings   COLONOSCOPY WITH PROPOFOL N/A 02/10/2017   Procedure: COLONOSCOPY WITH PROPOFOL;  Surgeon: Kieth Brightly, MD;  Location: [facility];  Service: Endoscopy;  Laterality: N/A;   ORIF TIBIA PLATEAU Left 08/03/2019   Procedure: OPEN REDUCTION INTERNAL FIXATION (ORIF) TIBIAL PLATEAU;  Surgeon: Myrene Galas, MD;  Location: [facility] OR;  Service: Orthopedics;  Laterality: Left;   Patient Active Problem List   Diagnosis Date Noted   Prostate cancer (HCC) 03/04/2021   Hypertension    Hyperlipidemia    Asthma    Tibial plateau fracture, left, closed, with nonunion, subsequent encounter 08/03/2019   Closed fracture of shaft of tibia 03/28/2019    Retrocalcaneal bursitis, right 04/05/2018   Chronic anemia 11/14/2013   OA (osteoarthritis) 11/14/2013    REFERRING DIAG: 4-part fracture of surgical neck of left humerus  THERAPY DIAG:  Pain in left upper arm  Stiffness of left shoulder, not elsewhere classified  Left shoulder pain, unspecified chronicity  Muscle weakness (generalized)  Rationale for Evaluation and Treatment: Rehabilitation  PERTINENT HISTORY: Patient is a 77 y.o. male who presents to outpatient physical therapy with a referral for medical diagnosis 4-part fracture of surgical neck of left humerus. This patient's chief complaints consist of left upper arm and shoulder pain, shoulder stiffness, and L UE weakness/swelling 2/2 L humeral neck fracture on 07/24/2022 leading to the following functional deficits: difficulty completing any activities that require use of left UE, showering, washing dishes, dressing, hygiene, working out at [employer] for health, sleeping, housework, cooking, throwing balls, some things he does left handed. Relevant past medical history and comorbidities include prostate cancer (dx Fall 2021, 40 radiation treatments in summer 2022, now doing well followed 1x a year), HTN, hyperlipidemia, asthma (as a child), ORIF L tibial Plateau (08/03/2019), OA, appendectomy, skin cancer (removed).  Patient denies hx of stroke, seizures, lung problems, heart problems, diabetes, unexplained weight loss, unexplained changes in bowel or bladder problems, unexplained stumbling or dropping things, osteoporosis, and spinal surgery  PRECAUTIONS: fracture 07/24/2022  SUBJECTIVE:                                                                                                                                                                                      SUBJECTIVE STATEMENT:  Patient states he is feeling good today. He reports no pain upon arrival.  He states HEP is going well and the FIR stretch with pulleys is still the most  challenging exercise for him. Her 22 year old granddaughter is visiting.   PAIN:  NPRS 0-1/10 left shoulder/upper arm  OBJECTIVE   TODAY'S TREATMENT:  Therapeutic exercise: to centralize symptoms and improve ROM, strength, muscular endurance, and activity tolerance required for successful completion of functional activities.  - Upper body ergometer at level 5-7 to  encourage joint nutrition, warm tissue, induce analgesic effect of aerobic exercise, improve muscular strength and endurance,  and prepare for remainder of session. 5 min switching directions every 1 minute.  Seat position 9.  (Manual therapy - see below) - supine AAROM L shoulder flexion with PVC stick loaded with 10#AW, 2x10 (manual therapy performed between sets, felt more ROM and increased pain after manual therapy).  - standing L shoulder flexion and abduction mini ball circles on the wall, 2x40 each direction CW and CCW.  - Education on HEP including handout   Manual therapy: to reduce pain and tissue tension, improve range of motion, neuromodulation, in order to promote improved ability to complete functional activities. SUPINE - STM to left subscapularis, teres major, and lat with intermittent PROM L shoulder ER and scaption/flexion with prolonged hold.  - left GHJ mobilizations AP, caudal glide in various positions.  - L GHJ ER/IR manual perturbations in 45 degrees scaption, 3x45 seconds.    Pt required multimodal cuing for proper technique and to facilitate improved neuromuscular control, strength, range of motion, and functional ability resulting in improved performance and form.   PATIENT EDUCATION: Education details: Exercise purpose/form. Self management techniques.  Reviewed cancelation/no-show policy with patient and confirmed patient has correct phone number for clinic; patient verbalized understanding (10/01/22). Person educated: Patient Education method: Explanation, Demonstration, Tactile cues, Verbal cues,  and Handouts Education comprehension: verbalized understanding, returned demonstration, and needs further education   HOME EXERCISE PROGRAM: Access Code: [email or web address] URL: [email or web address] Date: 01/12/2023 Prepared by: Norton Blizzard  Exercises - Supine Shoulder Flexion with Dowel  - 1 x daily - 1 sets - 20 reps - 5 seconds hold - Shoulder Flexion Wall Slide with Towel  - 1 x daily - 20 reps - 5 second hold - Seated Shoulder Abduction AAROM with Pulley Behind  - 1 x daily - 1 sets -  20 reps - 5 seconds hold - Seated Shoulder Internal Rotation AAROM with Pulley (Mirrored)  - 1 x daily - 1 sets - 20 reps - 5 seconds hold - Shoulder ER Stretch in Abduction  - 1 x daily - 2-3 sets - 10 reps - 5-10 hold - Standing Single Arm Row with Resistance Thumb Up  - 3-5 x weekly - 3 sets - 10 reps - Shoulder External Rotation with Anchored Resistance  - 3-5 x weekly - 3 sets - 10 reps - Shoulder Internal Rotation with Resistance  - 3-5 x weekly - 3 sets - 10 reps - Sidelying Shoulder Abduction Palm Forward  - 3-5 x weekly - 3 sets - 10 reps - Sidelying Shoulder Horizontal Abduction  - 3-5 x weekly - 3 sets - 10 reps - Seated Single Arm Bicep Curls Supinated with Dumbbell  - 3-5 x weekly - 3 sets - 10-15 reps - Standing Wall [employer] with Mini Swiss Ball  - 3-5 x weekly - 2 sets - 40 reps - Standing Wall [employer] in Scaption with Mini Swiss Ball  - 3-5 x weekly - 2 sets - 40 reps    ASSESSMENT:   CLINICAL IMPRESSION: Patient arrives reporting continued low levels of pain. Today's session continued to work on ROM, co-contraction at glenohumeral joint, and strength. Patient participated in perturbation training to encourage improved humeral head control and shoulder strength with PT providing perturbations and with ball circles at the wall. Added ball circles to HEP after patient found it challenging and a different sensation in his shoulder. He struggled with scapular shrug with  abducted position. Patient would benefit from continued management of limiting condition by skilled physical therapist to address remaining impairments and functional limitations to work towards stated goals and return to PLOF or maximal functional independence.   From initial PT eval 09/07/2022: Patient is a 77 y.o. male referred to outpatient physical therapy with a medical diagnosis of 4-part fracture of surgical neck of left humerus who presents with signs and symptoms consistent with R shoulder pain, stiffness, and weakness, and L UE swelling 2/2 proximal humerus fracture on 07/24/2022 after FOOSH. Patient presents with significant swelling, pain, joint stiffness, guarding, motor control, muscle tension, muscle performance (strength/power/endurance), swelling, tissue integrity, and activity tolerance impairments that are limiting ability to complete any activities that require use of left UE, showering, washing dishes, dressing, hygiene, working out at [employer] for health, sleeping, housework, cooking, throwing balls, some things he does left handed without difficulty. He currently has significant limitations due to pain with movement overhead with empty end feel with flexion and abduction. Patient will benefit from skilled physical therapy intervention to address current body structure impairments and activity limitations to improve function and work towards goals set in current POC in order to return to prior level of function or maximal functional improvement.    OBJECTIVE IMPAIRMENTS: decreased activity tolerance, decreased coordination, decreased endurance, decreased knowledge of condition, decreased mobility, decreased ROM, decreased strength, hypomobility, increased edema, increased fascial restrictions, impaired perceived functional ability, increased muscle spasms, impaired flexibility, impaired UE functional use, improper body mechanics, and pain.    ACTIVITY LIMITATIONS: carrying, lifting,  sleeping, bathing, dressing, reach over head, hygiene/grooming, and caring for others   PARTICIPATION LIMITATIONS: meal prep, cleaning, interpersonal relationship, shopping, community activity, occupation, yard work, and   difficulty completing any activities that require use of left UE, showering, washing dishes, dressing, hygiene, working out at [employer] for health, sleeping, housework, cooking,  throwing balls, some things he does left handed   PERSONAL FACTORS: Age, Past/current experiences, Time since onset of injury/illness/exacerbation, and 3+ comorbidities:   prostate cancer (dx Fall 2021, 40 radiation treatments in summer 2022, now doing well followed 1x a year), HTN, hyperlipidemia, asthma (as a child), ORIF L tibial Plateau (08/03/2019), OA, appendectomy, skin cancer (removed) are also affecting patient's functional outcome.    REHAB POTENTIAL: Good   CLINICAL DECISION MAKING: Stable/uncomplicated   EVALUATION COMPLEXITY: Low     GOALS: Goals reviewed with patient? No   SHORT TERM GOALS: Target date: 09/21/2022   Patient will be independent with initial home exercise program for self-management of symptoms. Baseline: Initial HEP provided at IE (09/07/22); Goal status: MET     LONG TERM GOALS: Target date: 11/30/2022  Updated to 02/08/2023 for all unmet goals on 11/16/2022.    Patient will be independent with a long-term home exercise program for self-management of symptoms.  Baseline: Initial HEP provided at [facility] (09/07/22); excellent participation (10/08/2022); continues participation as able (11/16/2022; 12/22/2022);  Goal status: In-progress   2.  Patient will demonstrate improved FOTO to equal or greater than 67 by visit #12 to demonstrate improvement in overall condition and self-reported functional ability.  Baseline: 44 (09/07/22); 61 at visit #8 (10/01/2022); 60 at visit #20 (11/16/2022); 74 at visit #30 (12/22/2022);  Goal status: In-progress   3.  Patient will demonstrate L  shoulder AROM equal or greater than 130 degrees flexion, 110 degrees abduction,  80 degrees ER at 90 degrees abduction, and IR to L1 to improve his ability to dress, reach overhead, and throw balls.  Baseline: very limited  - see objective (09/07/22); continues to be limited but improving - see objective (10/08/2022);  similar limitations but less painful  - see objective (11/16/2022); similar limitations but no longer painful - see objective (12/22/2022);  Goal status: In-progress   4.  Patient will demonstrate L shoulder strength equal or greater than 4/5 MMT in flexion, abduction, ER, and IR to improve his ability to reach overhead, throw a ball, and dress. Baseline: testing deferred due to acuity of injury (09/07/22); resistive testing continues to be deferred but see AROM for strength (10/08/2022); lacks AROM but does have good resistance to challenge within available AROM except ER - see above (11/16/2022; 12/22/2022);  Goal status: In-progress   5.  Patient will complete community, work and/or recreational activities with equal or less than 75% limitation due to current condition.  Baseline: difficulty completing any activities that require use of left UE, showering, washing dishes, dressing, hygiene, working out at [employer] for health, sleeping, housework, cooking, throwing balls, some things he does left handed (09/07/22); has noted improvement with ability to use left UE for activities that do not require overhead motion or heavy force such as putting his shirttail in, holding low on the handle of the elliptical machine, reaching for the car door, washing dishes, flossing; still cannot shampoo hair with left hand (10/08/2022); pt reports he notices more he can do every day (11/16/2022); patient reports he ocntinues to feel improvement daily (12/22/2022);  Goal status: In-progress   PLAN:   PT FREQUENCY: 1-2x/week   PT DURATION: 12 weeks   PLANNED INTERVENTIONS: Therapeutic exercises, Therapeutic  activity, Neuromuscular re-education, Balance training, Gait training, Patient/Family education, Self Care, Joint mobilization, Dry Needling, Electrical stimulation, Spinal mobilization, Cryotherapy, Moist heat, Manual therapy, and Re-evaluation   PLAN FOR NEXT SESSION: Update HEP as appropriate, progressive PROM, AAROM, strengthening for posture, shoulder girdle,  and UE. Manual therapy for pain control, improved ROM.    Cira Rue, PT, DPT 01/12/2023, 4:41 PM   [facility] [address] [place], [place] [address] P: [phone] I F: [phone]

## 2023-01-14 ENCOUNTER — Encounter: Payer: Self-pay | Admitting: Physical Therapy

## 2023-01-14 ENCOUNTER — Ambulatory Visit: Payer: PPO | Admitting: Physical Therapy

## 2023-01-14 DIAGNOSIS — M6281 Muscle weakness (generalized): Secondary | ICD-10-CM

## 2023-01-14 DIAGNOSIS — M79622 Pain in left upper arm: Secondary | ICD-10-CM

## 2023-01-14 DIAGNOSIS — M25612 Stiffness of left shoulder, not elsewhere classified: Secondary | ICD-10-CM

## 2023-01-14 DIAGNOSIS — M25512 Pain in left shoulder: Secondary | ICD-10-CM

## 2023-01-14 NOTE — Therapy (Signed)
OUTPATIENT PHYSICAL THERAPY TREATMENT   Patient Name: Glenn Watson MRN: [id number] DOB:06-Apr-1946, 77 y.o., male Today's Date: 01/14/2023  PCP: Marguarite Arbour, MD REFERRING PROVIDER: Deeann Saint, MD  END OF SESSION:   PT End of Session - 01/14/23 0903     Visit Number 56    Number of Visits 63    Date for PT Re-Evaluation 02/08/23    Authorization Type [employer] reporting period from 12/17/2022    Progress Note Due on Visit 40    PT Start Time 0901    PT Stop Time 0940    PT Time Calculation (min) 39 min    Activity Tolerance Patient tolerated treatment well;Patient limited by fatigue;Patient limited by pain    Behavior During Therapy [facility] for tasks assessed/performed              Past Medical History:  Diagnosis Date   Anemia    Asthma    as a child, exercise asthma - none since high school"   Cancer (HCC)    skin cancer - basal, squamous- Mylenoma - stage 2- chest   Complication of anesthesia    "very sensitive to medications" "Had colonscopy with just Draminine"   Family history of adverse reaction to anesthesia    daughter- N/V   Hyperlipidemia    Hypertension    PONV (postoperative nausea and vomiting)    Past Surgical History:  Procedure Laterality Date   APPENDECTOMY  2011   Dr. Lemar Livings   COLONOSCOPY WITH PROPOFOL N/A 02/10/2017   Procedure: COLONOSCOPY WITH PROPOFOL;  Surgeon: Kieth Brightly, MD;  Location: [facility] ENDOSCOPY;  Service: Endoscopy;  Laterality: N/A;   ORIF TIBIA PLATEAU Left 08/03/2019   Procedure: OPEN REDUCTION INTERNAL FIXATION (ORIF) TIBIAL PLATEAU;  Surgeon: Myrene Galas, MD;  Location: [facility] OR;  Service: Orthopedics;  Laterality: Left;   Patient Active Problem List   Diagnosis Date Noted   Prostate cancer (HCC) 03/04/2021   Hypertension    Hyperlipidemia    Asthma    Tibial plateau fracture, left, closed, with nonunion, subsequent encounter 08/03/2019   Closed fracture of shaft of tibia 03/28/2019    Retrocalcaneal bursitis, right 04/05/2018   Chronic anemia 11/14/2013   OA (osteoarthritis) 11/14/2013    REFERRING DIAG: 4-part fracture of surgical neck of left humerus  THERAPY DIAG:  Pain in left upper arm  Stiffness of left shoulder, not elsewhere classified  Left shoulder pain, unspecified chronicity  Muscle weakness (generalized)  Rationale for Evaluation and Treatment: Rehabilitation  PERTINENT HISTORY: Patient is a 77 y.o. male who presents to outpatient physical therapy with a referral for medical diagnosis 4-part fracture of surgical neck of left humerus. This patient's chief complaints consist of left upper arm and shoulder pain, shoulder stiffness, and L UE weakness/swelling 2/2 L humeral neck fracture on 07/24/2022 leading to the following functional deficits: difficulty completing any activities that require use of left UE, showering, washing dishes, dressing, hygiene, working out at [employer] for health, sleeping, housework, cooking, throwing balls, some things he does left handed. Relevant past medical history and comorbidities include prostate cancer (dx Fall 2021, 40 radiation treatments in summer 2022, now doing well followed 1x a year), HTN, hyperlipidemia, asthma (as a child), ORIF L tibial Plateau (08/03/2019), OA, appendectomy, skin cancer (removed).  Patient denies hx of stroke, seizures, lung problems, heart problems, diabetes, unexplained weight loss, unexplained changes in bowel or bladder problems, unexplained stumbling or dropping things, osteoporosis, and spinal surgery   PRECAUTIONS: fracture 07/24/2022  SUBJECTIVE:                                                                                                                                                                                      SUBJECTIVE STATEMENT:  Patient states he is feeling well today. He is wearing gloves due to having trouble with his psoriasis on his hands. He did his new HEP yesterday and  felt it went pretty well.   PAIN:  NPRS 0/10 left shoulder/upper arm  OBJECTIVE   TODAY'S TREATMENT:  Therapeutic exercise: to centralize symptoms and improve ROM, strength, muscular endurance, and activity tolerance required for successful completion of functional activities.  - Upper body ergometer at level 5-7 to  encourage joint nutrition, warm tissue, induce analgesic effect of aerobic exercise, improve muscular strength and endurance,  and prepare for remainder of session. 5 min switching directions every 1 minute.  Seat position 9.  (Manual therapy - see below) - supine AAROM L shoulder flexion with PVC stick loaded with 10#AW, 2x10 with 5 second holds.  - supine L GHJ ER/IR body blade in 45 degrees scaption, 3x45 seconds each position.  - standing L shoulder flexion and abduction mini ball circles on the wall, 1x40 each direction CW and CCW.   Manual therapy: to reduce pain and tissue tension, improve range of motion, neuromodulation, in order to promote improved ability to complete functional activities. SUPINE - STM to left subscapularis, teres major, and lat with intermittent PROM L shoulder ER and scaption/flexion with prolonged hold.  - left GHJ mobilizations AP in 30 degrees abduction, caudal glide in 90 degrees scaption/abduction  - L GHJ ER/IR manual perturbations in 45 degrees scaption, 3x45 seconds each position.    Pt required multimodal cuing for proper technique and to facilitate improved neuromuscular control, strength, range of motion, and functional ability resulting in improved performance and form.   PATIENT EDUCATION: Education details: Exercise purpose/form. Self management techniques.  Reviewed cancelation/no-show policy with patient and confirmed patient has correct phone number for clinic; patient verbalized understanding (10/01/22). Person educated: Patient Education method: Explanation, Demonstration, Tactile cues, Verbal cues, and Handouts Education  comprehension: verbalized understanding, returned demonstration, and needs further education   HOME EXERCISE PROGRAM: Access Code: [email or web address] URL: [email or web address] Date: 01/12/2023 Prepared by: Norton Blizzard  Exercises - Supine Shoulder Flexion with Dowel  - 1 x daily - 1 sets - 20 reps - 5 seconds hold - Shoulder Flexion Wall Slide with Towel  - 1 x daily - 20 reps - 5 second hold - Seated Shoulder Abduction AAROM with Pulley Behind  - 1 x daily - 1 sets - 20 reps - 5 seconds hold -  Seated Shoulder Internal Rotation AAROM with Pulley (Mirrored)  - 1 x daily - 1 sets - 20 reps - 5 seconds hold - Shoulder ER Stretch in Abduction  - 1 x daily - 2-3 sets - 10 reps - 5-10 hold - Standing Single Arm Row with Resistance Thumb Up  - 3-5 x weekly - 3 sets - 10 reps - Shoulder External Rotation with Anchored Resistance  - 3-5 x weekly - 3 sets - 10 reps - Shoulder Internal Rotation with Resistance  - 3-5 x weekly - 3 sets - 10 reps - Sidelying Shoulder Abduction Palm Forward  - 3-5 x weekly - 3 sets - 10 reps - Sidelying Shoulder Horizontal Abduction  - 3-5 x weekly - 3 sets - 10 reps - Seated Single Arm Bicep Curls Supinated with Dumbbell  - 3-5 x weekly - 3 sets - 10-15 reps - Standing Wall [employer] with Mini Swiss Ball  - 3-5 x weekly - 2 sets - 40 reps - Standing Wall [employer] in Scaption with Mini Swiss Ball  - 3-5 x weekly - 2 sets - 40 reps    ASSESSMENT:   CLINICAL IMPRESSION: Patient arrives reporting good tolerance to last PT session and new exercises in HEP.  He needed instruction in correcting form for wall ball exercise today and was able to progress some of his perturbation exercises in ER/IR. He continues to have very stiff L GHJ and difficulty with motor control and strength.Patient would benefit from continued management of limiting condition by skilled physical therapist to address remaining impairments and functional limitations to work towards stated goals  and return to PLOF or maximal functional independence.    From initial PT eval 09/07/2022: Patient is a 77 y.o. male referred to outpatient physical therapy with a medical diagnosis of 4-part fracture of surgical neck of left humerus who presents with signs and symptoms consistent with R shoulder pain, stiffness, and weakness, and L UE swelling 2/2 proximal humerus fracture on 07/24/2022 after FOOSH. Patient presents with significant swelling, pain, joint stiffness, guarding, motor control, muscle tension, muscle performance (strength/power/endurance), swelling, tissue integrity, and activity tolerance impairments that are limiting ability to complete any activities that require use of left UE, showering, washing dishes, dressing, hygiene, working out at [employer] for health, sleeping, housework, cooking, throwing balls, some things he does left handed without difficulty. He currently has significant limitations due to pain with movement overhead with empty end feel with flexion and abduction. Patient will benefit from skilled physical therapy intervention to address current body structure impairments and activity limitations to improve function and work towards goals set in current POC in order to return to prior level of function or maximal functional improvement.    OBJECTIVE IMPAIRMENTS: decreased activity tolerance, decreased coordination, decreased endurance, decreased knowledge of condition, decreased mobility, decreased ROM, decreased strength, hypomobility, increased edema, increased fascial restrictions, impaired perceived functional ability, increased muscle spasms, impaired flexibility, impaired UE functional use, improper body mechanics, and pain.    ACTIVITY LIMITATIONS: carrying, lifting, sleeping, bathing, dressing, reach over head, hygiene/grooming, and caring for others   PARTICIPATION LIMITATIONS: meal prep, cleaning, interpersonal relationship, shopping, community activity, occupation, yard  work, and   difficulty completing any activities that require use of left UE, showering, washing dishes, dressing, hygiene, working out at [employer] for health, sleeping, housework, cooking, throwing balls, some things he does left handed   PERSONAL FACTORS: Age, Past/current experiences, Time since onset of injury/illness/exacerbation, and 3+ comorbidities:  prostate cancer (dx Fall 2021, 40 radiation treatments in summer 2022, now doing well followed 1x a year), HTN, hyperlipidemia, asthma (as a child), ORIF L tibial Plateau (08/03/2019), OA, appendectomy, skin cancer (removed) are also affecting patient's functional outcome.    REHAB POTENTIAL: Good   CLINICAL DECISION MAKING: Stable/uncomplicated   EVALUATION COMPLEXITY: Low     GOALS: Goals reviewed with patient? No   SHORT TERM GOALS: Target date: 09/21/2022   Patient will be independent with initial home exercise program for self-management of symptoms. Baseline: Initial HEP provided at IE (09/07/22); Goal status: MET     LONG TERM GOALS: Target date: 11/30/2022  Updated to 02/08/2023 for all unmet goals on 11/16/2022.    Patient will be independent with a long-term home exercise program for self-management of symptoms.  Baseline: Initial HEP provided at IE (09/07/22); excellent participation (10/08/2022); continues participation as able (11/16/2022; 12/22/2022);  Goal status: In-progress   2.  Patient will demonstrate improved FOTO to equal or greater than 67 by visit #12 to demonstrate improvement in overall condition and self-reported functional ability.  Baseline: 44 (09/07/22); 61 at visit #8 (10/01/2022); 60 at visit #20 (11/16/2022); 74 at visit #30 (12/22/2022);  Goal status: In-progress   3.  Patient will demonstrate L shoulder AROM equal or greater than 130 degrees flexion, 110 degrees abduction,  80 degrees ER at 90 degrees abduction, and IR to L1 to improve his ability to dress, reach overhead, and throw balls.  Baseline: very  limited  - see objective (09/07/22); continues to be limited but improving - see objective (10/08/2022);  similar limitations but less painful  - see objective (11/16/2022); similar limitations but no longer painful - see objective (12/22/2022);  Goal status: In-progress   4.  Patient will demonstrate L shoulder strength equal or greater than 4/5 MMT in flexion, abduction, ER, and IR to improve his ability to reach overhead, throw a ball, and dress. Baseline: testing deferred due to acuity of injury (09/07/22); resistive testing continues to be deferred but see AROM for strength (10/08/2022); lacks AROM but does have good resistance to challenge within available AROM except ER - see above (11/16/2022; 12/22/2022);  Goal status: In-progress   5.  Patient will complete community, work and/or recreational activities with equal or less than 75% limitation due to current condition.  Baseline: difficulty completing any activities that require use of left UE, showering, washing dishes, dressing, hygiene, working out at [employer] for health, sleeping, housework, cooking, throwing balls, some things he does left handed (09/07/22); has noted improvement with ability to use left UE for activities that do not require overhead motion or heavy force such as putting his shirttail in, holding low on the handle of the elliptical machine, reaching for the car door, washing dishes, flossing; still cannot shampoo hair with left hand (10/08/2022); pt reports he notices more he can do every day (11/16/2022); patient reports he ocntinues to feel improvement daily (12/22/2022);  Goal status: In-progress   PLAN:   PT FREQUENCY: 1-2x/week   PT DURATION: 12 weeks   PLANNED INTERVENTIONS: Therapeutic exercises, Therapeutic activity, Neuromuscular re-education, Balance training, Gait training, Patient/Family education, Self Care, Joint mobilization, Dry Needling, Electrical stimulation, Spinal mobilization, Cryotherapy, Moist heat, Manual  therapy, and Re-evaluation   PLAN FOR NEXT SESSION: Update HEP as appropriate, progressive PROM, AAROM, strengthening for posture, shoulder girdle, and UE. Manual therapy for pain control, improved ROM.    Cira Rue, PT, DPT 01/14/2023, 9:50 AM   [facility] [address] [place], [place] [address] P: [phone] I F: [phone]

## 2023-01-18 ENCOUNTER — Encounter: Payer: PPO | Admitting: Physical Therapy

## 2023-01-20 ENCOUNTER — Encounter: Payer: PPO | Admitting: Physical Therapy

## 2023-01-25 ENCOUNTER — Encounter: Payer: Self-pay | Admitting: Physical Therapy

## 2023-01-25 ENCOUNTER — Ambulatory Visit: Payer: PPO

## 2023-01-25 DIAGNOSIS — M25612 Stiffness of left shoulder, not elsewhere classified: Secondary | ICD-10-CM

## 2023-01-25 DIAGNOSIS — M6281 Muscle weakness (generalized): Secondary | ICD-10-CM

## 2023-01-25 DIAGNOSIS — M79622 Pain in left upper arm: Secondary | ICD-10-CM

## 2023-01-25 DIAGNOSIS — M25512 Pain in left shoulder: Secondary | ICD-10-CM

## 2023-01-25 NOTE — Therapy (Signed)
OUTPATIENT PHYSICAL THERAPY TREATMENT   Patient Name: Glenn Watson MRN: [id number] DOB:[birth year], 77 y.o., male Today's Date: 01/25/2023  PCP: Marguarite Arbour, MD REFERRING PROVIDER: Deeann Saint, MD  END OF SESSION:   PT End of Session - 01/25/23 0908     Visit Number 37    Number of Visits 63    Date for PT Re-Evaluation 02/08/23    Authorization Type [employer] reporting period from 12/17/2022    Progress Note Due on Visit 40    PT Start Time 0940    PT Stop Time 1024    PT Time Calculation (min) 44 min    Activity Tolerance Patient tolerated treatment well;Patient limited by fatigue;Patient limited by pain    Behavior During Therapy [facility] for tasks assessed/performed               Past Medical History:  Diagnosis Date   Anemia    Asthma    as a child, exercise asthma - none since high school"   Cancer (HCC)    skin cancer - basal, squamous- Mylenoma - stage 2- chest   Complication of anesthesia    "very sensitive to medications" "Had colonscopy with just Draminine"   Family history of adverse reaction to anesthesia    daughter- N/V   Hyperlipidemia    Hypertension    PONV (postoperative nausea and vomiting)    Past Surgical History:  Procedure Laterality Date   APPENDECTOMY  2011   Dr. Lemar Livings   COLONOSCOPY WITH PROPOFOL N/A 02/10/2017   Procedure: COLONOSCOPY WITH PROPOFOL;  Surgeon: Kieth Brightly, MD;  Location: [facility] ENDOSCOPY;  Service: Endoscopy;  Laterality: N/A;   ORIF TIBIA PLATEAU Left 08/03/2019   Procedure: OPEN REDUCTION INTERNAL FIXATION (ORIF) TIBIAL PLATEAU;  Surgeon: Myrene Galas, MD;  Location: [facility] OR;  Service: Orthopedics;  Laterality: Left;   Patient Active Problem List   Diagnosis Date Noted   Prostate cancer (HCC) 03/04/2021   Hypertension    Hyperlipidemia    Asthma    Tibial plateau fracture, left, closed, with nonunion, subsequent encounter 08/03/2019   Closed fracture of shaft of tibia 03/28/2019    Retrocalcaneal bursitis, right 04/05/2018   Chronic anemia 11/14/2013   OA (osteoarthritis) 11/14/2013    REFERRING DIAG: 4-part fracture of surgical neck of left humerus  THERAPY DIAG:  Pain in left upper arm  Muscle weakness (generalized)  Stiffness of left shoulder, not elsewhere classified  Left shoulder pain, unspecified chronicity  Rationale for Evaluation and Treatment: Rehabilitation  PERTINENT HISTORY: Patient is a 77 y.o. male who presents to outpatient physical therapy with a referral for medical diagnosis 4-part fracture of surgical neck of left humerus. This patient's chief complaints consist of left upper arm and shoulder pain, shoulder stiffness, and L UE weakness/swelling 2/2 L humeral neck fracture on 07/24/2022 leading to the following functional deficits: difficulty completing any activities that require use of left UE, showering, washing dishes, dressing, hygiene, working out at [employer] for health, sleeping, housework, cooking, throwing balls, some things he does left handed. Relevant past medical history and comorbidities include prostate cancer (dx Fall 2021, 40 radiation treatments in summer 2022, now doing well followed 1x a year), HTN, hyperlipidemia, asthma (as a child), ORIF L tibial Plateau (08/03/2019), OA, appendectomy, skin cancer (removed).  Patient denies hx of stroke, seizures, lung problems, heart problems, diabetes, unexplained weight loss, unexplained changes in bowel or bladder problems, unexplained stumbling or dropping things, osteoporosis, and spinal surgery   PRECAUTIONS: fracture  07/24/2022  SUBJECTIVE:                                                                                                                                                                                      SUBJECTIVE STATEMENT: Patient reports 1/10 soreness in L shoulder. Able to complete HEP while on vacation.   PAIN:  NPRS 0/10 left shoulder/upper arm  OBJECTIVE    TODAY'S TREATMENT:   Therapeutic exercise: to centralize symptoms and improve ROM, strength, muscular endurance, and activity tolerance required for successful completion of functional activities.  - Upper body ergometer at level 5-7 to  encourage joint nutrition, warm tissue, induce analgesic effect of aerobic exercise, improve muscular strength and endurance,  and prepare for remainder of session. 5 min switching directions every 1 minute.  Seat position 9.  (Manual therapy - see below) - supine AAROM L shoulder flexion with PVC stick loaded with 10#AW, 2x10 with 5 second holds.  -Supine chest press with PVC stick loaded with 10# AW, 2 x 10  -supine L shoulder rhythmic stabilization with 4# DB clockwise/counterclockwise 2 x 25 each direction  - supine L GHJ ER/IR body blade in 45 degrees scaption, 3x45 seconds each position. (Not today) -standing L shoulder ER/IR with red TB 2 x 10 - towel under arm   -standing horizontal abduction with red TB 2 x 10   Manual therapy: to reduce pain and tissue tension, improve range of motion, neuromodulation, in order to promote improved ability to complete functional activities. SUPINE - STM to left subscapularis, teres major, and lat with intermittent PROM L shoulder ER and scaption/flexion with prolonged hold.  - left GHJ mobilizations AP in 30 degrees abduction, caudal glide in 90 degrees scaption/abduction (not today) - L GHJ ER/IR manual perturbations in 45 degrees scaption with 4# DB, 3x45 seconds each position.  L GHJ flexion manual perturbations at 90 degrees flexion with 4# DB 3 x 45 seconds    Pt required multimodal cuing for proper technique and to facilitate improved neuromuscular control, strength, range of motion, and functional ability resulting in improved performance and form.   PATIENT EDUCATION: Education details: Exercise purpose/form. Self management techniques.  Reviewed cancelation/no-show policy with patient and confirmed patient  has correct phone number for clinic; patient verbalized understanding (10/01/22). Person educated: Patient Education method: Explanation, Demonstration, Tactile cues, Verbal cues, and Handouts Education comprehension: verbalized understanding, returned demonstration, and needs further education   HOME EXERCISE PROGRAM: Access Code: [email or web address] URL: [email or web address] Date: 01/12/2023 Prepared by: Norton Blizzard  Exercises - Supine Shoulder Flexion with Dowel  - 1 x daily - 1 sets - 20 reps - 5 seconds hold - Shoulder  Flexion Wall Slide with Towel  - 1 x daily - 20 reps - 5 second hold - Seated Shoulder Abduction AAROM with Pulley Behind  - 1 x daily - 1 sets - 20 reps - 5 seconds hold - Seated Shoulder Internal Rotation AAROM with Pulley (Mirrored)  - 1 x daily - 1 sets - 20 reps - 5 seconds hold - Shoulder ER Stretch in Abduction  - 1 x daily - 2-3 sets - 10 reps - 5-10 hold - Standing Single Arm Row with Resistance Thumb Up  - 3-5 x weekly - 3 sets - 10 reps - Shoulder External Rotation with Anchored Resistance  - 3-5 x weekly - 3 sets - 10 reps - Shoulder Internal Rotation with Resistance  - 3-5 x weekly - 3 sets - 10 reps - Sidelying Shoulder Abduction Palm Forward  - 3-5 x weekly - 3 sets - 10 reps - Sidelying Shoulder Horizontal Abduction  - 3-5 x weekly - 3 sets - 10 reps - Seated Single Arm Bicep Curls Supinated with Dumbbell  - 3-5 x weekly - 3 sets - 10-15 reps - Standing Wall [employer] with Mini Swiss Ball  - 3-5 x weekly - 2 sets - 40 reps - Standing Wall [employer] in Scaption with Mini Swiss Ball  - 3-5 x weekly - 2 sets - 40 reps    ASSESSMENT:   CLINICAL IMPRESSION:   Patient arrives to therapy session with reports of 1/10 soreness in L shoulder. Session focused on manual therapy and L UE strengthening within tolerance. Continues to demonstrate stiff L GHJ with associated weakness. Patient tolerated treatment session well with no increase in pain. Patient  would benefit from continued management of limiting condition by skilled physical therapist to address remaining impairments and functional limitations to work towards stated goals and return to PLOF or maximal functional independence.   From initial PT eval 09/07/2022: Patient is a 77 y.o. male referred to outpatient physical therapy with a medical diagnosis of 4-part fracture of surgical neck of left humerus who presents with signs and symptoms consistent with R shoulder pain, stiffness, and weakness, and L UE swelling 2/2 proximal humerus fracture on 07/24/2022 after FOOSH. Patient presents with significant swelling, pain, joint stiffness, guarding, motor control, muscle tension, muscle performance (strength/power/endurance), swelling, tissue integrity, and activity tolerance impairments that are limiting ability to complete any activities that require use of left UE, showering, washing dishes, dressing, hygiene, working out at [employer] for health, sleeping, housework, cooking, throwing balls, some things he does left handed without difficulty. He currently has significant limitations due to pain with movement overhead with empty end feel with flexion and abduction. Patient will benefit from skilled physical therapy intervention to address current body structure impairments and activity limitations to improve function and work towards goals set in current POC in order to return to prior level of function or maximal functional improvement.    OBJECTIVE IMPAIRMENTS: decreased activity tolerance, decreased coordination, decreased endurance, decreased knowledge of condition, decreased mobility, decreased ROM, decreased strength, hypomobility, increased edema, increased fascial restrictions, impaired perceived functional ability, increased muscle spasms, impaired flexibility, impaired UE functional use, improper body mechanics, and pain.    ACTIVITY LIMITATIONS: carrying, lifting, sleeping, bathing, dressing,  reach over head, hygiene/grooming, and caring for others   PARTICIPATION LIMITATIONS: meal prep, cleaning, interpersonal relationship, shopping, community activity, occupation, yard work, and   difficulty completing any activities that require use of left UE, showering, washing dishes, dressing, hygiene, working out  at the gym for health, sleeping, housework, cooking, throwing balls, some things he does left handed   PERSONAL FACTORS: Age, Past/current experiences, Time since onset of injury/illness/exacerbation, and 3+ comorbidities:   prostate cancer (dx Fall 2021, 40 radiation treatments in summer 2022, now doing well followed 1x a year), HTN, hyperlipidemia, asthma (as a child), ORIF L tibial Plateau (08/03/2019), OA, appendectomy, skin cancer (removed) are also affecting patient's functional outcome.    REHAB POTENTIAL: Good   CLINICAL DECISION MAKING: Stable/uncomplicated   EVALUATION COMPLEXITY: Low     GOALS: Goals reviewed with patient? No   SHORT TERM GOALS: Target date: 09/21/2022   Patient will be independent with initial home exercise program for self-management of symptoms. Baseline: Initial HEP provided at IE (09/07/22); Goal status: MET     LONG TERM GOALS: Target date: 11/30/2022  Updated to 02/08/2023 for all unmet goals on 11/16/2022.    Patient will be independent with a long-term home exercise program for self-management of symptoms.  Baseline: Initial HEP provided at IE (09/07/22); excellent participation (10/08/2022); continues participation as able (11/16/2022; 12/22/2022);  Goal status: In-progress   2.  Patient will demonstrate improved FOTO to equal or greater than 67 by visit #12 to demonstrate improvement in overall condition and self-reported functional ability.  Baseline: 44 (09/07/22); 61 at visit #8 (10/01/2022); 60 at visit #20 (11/16/2022); 74 at visit #30 (12/22/2022);  Goal status: In-progress   3.  Patient will demonstrate L shoulder AROM equal or greater than  130 degrees flexion, 110 degrees abduction,  80 degrees ER at 90 degrees abduction, and IR to L1 to improve his ability to dress, reach overhead, and throw balls.  Baseline: very limited  - see objective (09/07/22); continues to be limited but improving - see objective (10/08/2022);  similar limitations but less painful  - see objective (11/16/2022); similar limitations but no longer painful - see objective (12/22/2022);  Goal status: In-progress   4.  Patient will demonstrate L shoulder strength equal or greater than 4/5 MMT in flexion, abduction, ER, and IR to improve his ability to reach overhead, throw a ball, and dress. Baseline: testing deferred due to acuity of injury (09/07/22); resistive testing continues to be deferred but see AROM for strength (10/08/2022); lacks AROM but does have good resistance to challenge within available AROM except ER - see above (11/16/2022; 12/22/2022);  Goal status: In-progress   5.  Patient will complete community, work and/or recreational activities with equal or less than 75% limitation due to current condition.  Baseline: difficulty completing any activities that require use of left UE, showering, washing dishes, dressing, hygiene, working out at [employer] for health, sleeping, housework, cooking, throwing balls, some things he does left handed (09/07/22); has noted improvement with ability to use left UE for activities that do not require overhead motion or heavy force such as putting his shirttail in, holding low on the handle of the elliptical machine, reaching for the car door, washing dishes, flossing; still cannot shampoo hair with left hand (10/08/2022); pt reports he notices more he can do every day (11/16/2022); patient reports he ocntinues to feel improvement daily (12/22/2022);  Goal status: In-progress   PLAN:   PT FREQUENCY: 1-2x/week   PT DURATION: 12 weeks   PLANNED INTERVENTIONS: Therapeutic exercises, Therapeutic activity, Neuromuscular re-education,  Balance training, Gait training, Patient/Family education, Self Care, Joint mobilization, Dry Needling, Electrical stimulation, Spinal mobilization, Cryotherapy, Moist heat, Manual therapy, and Re-evaluation   PLAN FOR NEXT SESSION: Update HEP as appropriate,  progressive PROM, AAROM, strengthening for posture, shoulder girdle, and UE. Manual therapy for pain control, improved ROM.    Viviann Spare, PT, DPT 01/25/2023, 11:24 AM   [facility] [address] [place], [place] [address] P: [phone] I F: [phone]

## 2023-01-27 ENCOUNTER — Encounter: Payer: PPO | Admitting: Physical Therapy

## 2023-01-28 ENCOUNTER — Ambulatory Visit: Payer: PPO | Attending: Specialist | Admitting: Physical Therapy

## 2023-01-28 ENCOUNTER — Encounter: Payer: Self-pay | Admitting: Physical Therapy

## 2023-01-28 DIAGNOSIS — M25612 Stiffness of left shoulder, not elsewhere classified: Secondary | ICD-10-CM | POA: Diagnosis not present

## 2023-01-28 DIAGNOSIS — M6281 Muscle weakness (generalized): Secondary | ICD-10-CM

## 2023-01-28 DIAGNOSIS — M25512 Pain in left shoulder: Secondary | ICD-10-CM | POA: Diagnosis not present

## 2023-01-28 DIAGNOSIS — M79622 Pain in left upper arm: Secondary | ICD-10-CM

## 2023-01-28 NOTE — Therapy (Signed)
OUTPATIENT PHYSICAL THERAPY TREATMENT   Patient Name: Glenn Watson MRN: [id number] DOB:05-16-1946, 77 y.o., male Today's Date: 01/28/2023  PCP: Marguarite Arbour, MD REFERRING PROVIDER: Deeann Saint, MD  END OF SESSION:   PT End of Session - 01/28/23 2007     Visit Number 38    Number of Visits 63    Date for PT Re-Evaluation 02/08/23    Authorization Type [employer] reporting period from 12/17/2022    Progress Note Due on Visit 40    PT Start Time 1302    PT Stop Time 1342    PT Time Calculation (min) 40 min    Activity Tolerance Patient tolerated treatment well;Patient limited by fatigue;Patient limited by pain    Behavior During Therapy WFL for tasks assessed/performed                Past Medical History:  Diagnosis Date   Anemia    Asthma    as a child, exercise asthma - none since high school"   Cancer (HCC)    skin cancer - basal, squamous- Mylenoma - stage 2- chest   Complication of anesthesia    "very sensitive to medications" "Had colonscopy with just Draminine"   Family history of adverse reaction to anesthesia    daughter- N/V   Hyperlipidemia    Hypertension    PONV (postoperative nausea and vomiting)    Past Surgical History:  Procedure Laterality Date   APPENDECTOMY  2011   Dr. Lemar Livings   COLONOSCOPY WITH PROPOFOL N/A 02/10/2017   Procedure: COLONOSCOPY WITH PROPOFOL;  Surgeon: Kieth Brightly, MD;  Location: [facility];  Service: Endoscopy;  Laterality: N/A;   ORIF TIBIA PLATEAU Left 08/03/2019   Procedure: OPEN REDUCTION INTERNAL FIXATION (ORIF) TIBIAL PLATEAU;  Surgeon: Myrene Galas, MD;  Location: [facility] OR;  Service: Orthopedics;  Laterality: Left;   Patient Active Problem List   Diagnosis Date Noted   Prostate cancer (HCC) 03/04/2021   Hypertension    Hyperlipidemia    Asthma    Tibial plateau fracture, left, closed, with nonunion, subsequent encounter 08/03/2019   Closed fracture of shaft of tibia 03/28/2019    Retrocalcaneal bursitis, right 04/05/2018   Chronic anemia 11/14/2013   OA (osteoarthritis) 11/14/2013    REFERRING DIAG: 4-part fracture of surgical neck of left humerus  THERAPY DIAG:  Pain in left upper arm  Stiffness of left shoulder, not elsewhere classified  Left shoulder pain, unspecified chronicity  Muscle weakness (generalized)  Rationale for Evaluation and Treatment: Rehabilitation  PERTINENT HISTORY: Patient is a 77 y.o. male who presents to outpatient physical therapy with a referral for medical diagnosis 4-part fracture of surgical neck of left humerus. This patient's chief complaints consist of left upper arm and shoulder pain, shoulder stiffness, and L UE weakness/swelling 2/2 L humeral neck fracture on 07/24/2022 leading to the following functional deficits: difficulty completing any activities that require use of left UE, showering, washing dishes, dressing, hygiene, working out at [employer] for health, sleeping, housework, cooking, throwing balls, some things he does left handed. Relevant past medical history and comorbidities include prostate cancer (dx Fall 2021, 40 radiation treatments in summer 2022, now doing well followed 1x a year), HTN, hyperlipidemia, asthma (as a child), ORIF L tibial Plateau (08/03/2019), OA, appendectomy, skin cancer (removed).  Patient denies hx of stroke, seizures, lung problems, heart problems, diabetes, unexplained weight loss, unexplained changes in bowel or bladder problems, unexplained stumbling or dropping things, osteoporosis, and spinal surgery   PRECAUTIONS:  fracture 07/24/2022  SUBJECTIVE:                                                                                                                                                                                      SUBJECTIVE STATEMENT: Patient reports his left shoulder has been feeling about the same. He states he has gotten better at several things, but the important thing he has gotten  better with is FIR. He states the ball circles on the wall seems to help a lot.   PAIN:  NPRS 1/10 left shoulder/upper arm  OBJECTIVE   TODAY'S TREATMENT:   Therapeutic exercise: to centralize symptoms and improve ROM, strength, muscular endurance, and activity tolerance required for successful completion of functional activities.  - Upper body ergometer at level 7 to  encourage joint nutrition, warm tissue, induce analgesic effect of aerobic exercise, improve muscular strength and endurance,  and prepare for remainder of session. 5 min switching directions every 1 minute.   (Manual therapy / dry needling - see below) - supine AAROM L shoulder flexion with PVC stick loaded with 10#AW, 2x10 with 5 second holds.   Manual therapy: to reduce pain and tissue tension, improve range of motion, neuromodulation, in order to promote improved ability to complete functional activities. SUPINE - STM to left pectoralis major/minor,  - STM to subscapularis and lat dorsi including sustained pressure.   Modality: Dry needling performed to left periscapular muscles to decrease pain and spasms along patient's L shoulder region and allow for improved ROM with patient in supine utilizing 2 dry needle(s) .30mm x 30mm with 3 sticks along left pectoralis major and 3 sticks at left latissimus dorsi, and  1 dry needle(s) .30mm x 60mm with 3 sticks along L subscapularis and 1 stick at left latissimus dorsi. Patient educated about the risks and benefits from therapy and verbally consents to treatment. Patient draped appropiately. Dry needling performed by Luretha Murphy. Ilsa Iha PT, DPT who is certified in this technique.   Pt required multimodal cuing for proper technique and to facilitate improved neuromuscular control, strength, range of motion, and functional ability resulting in improved performance and form.   PATIENT EDUCATION: Education details: Exercise purpose/form. Self management techniques. Dry needling. Reviewed  cancelation/no-show policy with patient and confirmed patient has correct phone number for clinic; patient verbalized understanding (10/01/22). Person educated: Patient Education method: Explanation, Demonstration, Tactile cues, Verbal cues, and Handouts Education comprehension: verbalized understanding, returned demonstration, and needs further education   HOME EXERCISE PROGRAM: Access Code: [email or web address] URL: [email or web address] Date: 01/12/2023 Prepared by: Norton Blizzard  Exercises - Supine Shoulder Flexion with Dowel  - 1 x daily - 1 sets - 20  reps - 5 seconds hold - Shoulder Flexion Wall Slide with Towel  - 1 x daily - 20 reps - 5 second hold - Seated Shoulder Abduction AAROM with Pulley Behind  - 1 x daily - 1 sets - 20 reps - 5 seconds hold - Seated Shoulder Internal Rotation AAROM with Pulley (Mirrored)  - 1 x daily - 1 sets - 20 reps - 5 seconds hold - Shoulder ER Stretch in Abduction  - 1 x daily - 2-3 sets - 10 reps - 5-10 hold - Standing Single Arm Row with Resistance Thumb Up  - 3-5 x weekly - 3 sets - 10 reps - Shoulder External Rotation with Anchored Resistance  - 3-5 x weekly - 3 sets - 10 reps - Shoulder Internal Rotation with Resistance  - 3-5 x weekly - 3 sets - 10 reps - Sidelying Shoulder Abduction Palm Forward  - 3-5 x weekly - 3 sets - 10 reps - Sidelying Shoulder Horizontal Abduction  - 3-5 x weekly - 3 sets - 10 reps - Seated Single Arm Bicep Curls Supinated with Dumbbell  - 3-5 x weekly - 3 sets - 10-15 reps - Standing Wall [employer] with Mini Swiss Ball  - 3-5 x weekly - 2 sets - 40 reps - Standing Wall [employer] in Scaption with Mini Swiss Ball  - 3-5 x weekly - 2 sets - 40 reps    ASSESSMENT:   CLINICAL IMPRESSION:   Patient arrives reporting continued improvement in L UE function but also continued limitation in ROM and strength. Today's session included a trial of dry needling at left subscapularis, latissimus dorsi, and pec muscles to  improve overhead and ER range of motion. He had good tolerance to dry needling. Patient was able to get improved and easier overhead AAROM with loaded PVC stick following dry needling and manual therapy. Plan to continue with techniques for improved strength and motion next session. Patient would benefit from continued management of limiting condition by skilled physical therapist to address remaining impairments and functional limitations to work towards stated goals and return to PLOF or maximal functional independence.   From initial PT eval 09/07/2022: Patient is a 77 y.o. male referred to outpatient physical therapy with a medical diagnosis of 4-part fracture of surgical neck of left humerus who presents with signs and symptoms consistent with R shoulder pain, stiffness, and weakness, and L UE swelling 2/2 proximal humerus fracture on 07/24/2022 after FOOSH. Patient presents with significant swelling, pain, joint stiffness, guarding, motor control, muscle tension, muscle performance (strength/power/endurance), swelling, tissue integrity, and activity tolerance impairments that are limiting ability to complete any activities that require use of left UE, showering, washing dishes, dressing, hygiene, working out at [employer] for health, sleeping, housework, cooking, throwing balls, some things he does left handed without difficulty. He currently has significant limitations due to pain with movement overhead with empty end feel with flexion and abduction. Patient will benefit from skilled physical therapy intervention to address current body structure impairments and activity limitations to improve function and work towards goals set in current POC in order to return to prior level of function or maximal functional improvement.    OBJECTIVE IMPAIRMENTS: decreased activity tolerance, decreased coordination, decreased endurance, decreased knowledge of condition, decreased mobility, decreased ROM, decreased strength,  hypomobility, increased edema, increased fascial restrictions, impaired perceived functional ability, increased muscle spasms, impaired flexibility, impaired UE functional use, improper body mechanics, and pain.    ACTIVITY LIMITATIONS: carrying, lifting, sleeping, bathing,  dressing, reach over head, hygiene/grooming, and caring for others   PARTICIPATION LIMITATIONS: meal prep, cleaning, interpersonal relationship, shopping, community activity, occupation, yard work, and   difficulty completing any activities that require use of left UE, showering, washing dishes, dressing, hygiene, working out at [employer] for health, sleeping, housework, cooking, throwing balls, some things he does left handed   PERSONAL FACTORS: Age, Past/current experiences, Time since onset of injury/illness/exacerbation, and 3+ comorbidities:   prostate cancer (dx Fall 2021, 40 radiation treatments in summer 2022, now doing well followed 1x a year), HTN, hyperlipidemia, asthma (as a child), ORIF L tibial Plateau (08/03/2019), OA, appendectomy, skin cancer (removed) are also affecting patient's functional outcome.    REHAB POTENTIAL: Good   CLINICAL DECISION MAKING: Stable/uncomplicated   EVALUATION COMPLEXITY: Low     GOALS: Goals reviewed with patient? No   SHORT TERM GOALS: Target date: 09/21/2022   Patient will be independent with initial home exercise program for self-management of symptoms. Baseline: Initial HEP provided at IE (09/07/22); Goal status: MET     LONG TERM GOALS: Target date: 11/30/2022  Updated to 02/08/2023 for all unmet goals on 11/16/2022.    Patient will be independent with a long-term home exercise program for self-management of symptoms.  Baseline: Initial HEP provided at IE (09/07/22); excellent participation (10/08/2022); continues participation as able (11/16/2022; 12/22/2022);  Goal status: In-progress   2.  Patient will demonstrate improved FOTO to equal or greater than 67 by visit #12 to  demonstrate improvement in overall condition and self-reported functional ability.  Baseline: 44 (09/07/22); 61 at visit #8 (10/01/2022); 60 at visit #20 (11/16/2022); 74 at visit #30 (12/22/2022);  Goal status: In-progress   3.  Patient will demonstrate L shoulder AROM equal or greater than 130 degrees flexion, 110 degrees abduction,  80 degrees ER at 90 degrees abduction, and IR to L1 to improve his ability to dress, reach overhead, and throw balls.  Baseline: very limited  - see objective (09/07/22); continues to be limited but improving - see objective (10/08/2022);  similar limitations but less painful  - see objective (11/16/2022); similar limitations but no longer painful - see objective (12/22/2022);  Goal status: In-progress   4.  Patient will demonstrate L shoulder strength equal or greater than 4/5 MMT in flexion, abduction, ER, and IR to improve his ability to reach overhead, throw a ball, and dress. Baseline: testing deferred due to acuity of injury (09/07/22); resistive testing continues to be deferred but see AROM for strength (10/08/2022); lacks AROM but does have good resistance to challenge within available AROM except ER - see above (11/16/2022; 12/22/2022);  Goal status: In-progress   5.  Patient will complete community, work and/or recreational activities with equal or less than 75% limitation due to current condition.  Baseline: difficulty completing any activities that require use of left UE, showering, washing dishes, dressing, hygiene, working out at [employer] for health, sleeping, housework, cooking, throwing balls, some things he does left handed (09/07/22); has noted improvement with ability to use left UE for activities that do not require overhead motion or heavy force such as putting his shirttail in, holding low on the handle of the elliptical machine, reaching for the car door, washing dishes, flossing; still cannot shampoo hair with left hand (10/08/2022); pt reports he notices more  he can do every day (11/16/2022); patient reports he ocntinues to feel improvement daily (12/22/2022);  Goal status: In-progress   PLAN:   PT FREQUENCY: 1-2x/week   PT DURATION: 12  weeks   PLANNED INTERVENTIONS: Therapeutic exercises, Therapeutic activity, Neuromuscular re-education, Balance training, Gait training, Patient/Family education, Self Care, Joint mobilization, Dry Needling, Electrical stimulation, Spinal mobilization, Cryotherapy, Moist heat, Manual therapy, and Re-evaluation   PLAN FOR NEXT SESSION: Update HEP as appropriate, progressive PROM, AAROM, strengthening for posture, shoulder girdle, and UE. Manual therapy for pain control, improved ROM.    Cira Rue, PT, DPT 01/28/2023, 8:15 PM   [facility] [address] [place], [place] [address] P: [phone] I F: [phone]

## 2023-02-02 ENCOUNTER — Encounter: Payer: Self-pay | Admitting: Physical Therapy

## 2023-02-02 ENCOUNTER — Ambulatory Visit: Payer: PPO | Admitting: Physical Therapy

## 2023-02-02 DIAGNOSIS — M79622 Pain in left upper arm: Secondary | ICD-10-CM | POA: Diagnosis not present

## 2023-02-02 DIAGNOSIS — M25512 Pain in left shoulder: Secondary | ICD-10-CM

## 2023-02-02 DIAGNOSIS — M25612 Stiffness of left shoulder, not elsewhere classified: Secondary | ICD-10-CM

## 2023-02-02 DIAGNOSIS — M6281 Muscle weakness (generalized): Secondary | ICD-10-CM

## 2023-02-02 NOTE — Therapy (Signed)
OUTPATIENT PHYSICAL THERAPY TREATMENT   Patient Name: Glenn Watson MRN: [id number] DOB:March 03, 1946, 77 y.o., male Today's Date: 02/02/2023  PCP: Marguarite Arbour, MD REFERRING PROVIDER: Deeann Saint, MD  END OF SESSION:   PT End of Session - 02/02/23 0906     Visit Number 39    Number of Visits 63    Date for PT Re-Evaluation 02/08/23    Authorization Type [employer] reporting period from 12/17/2022    Progress Note Due on Visit 40    PT Start Time 0903    PT Stop Time 0940    PT Time Calculation (min) 37 min    Activity Tolerance Patient tolerated treatment well;Patient limited by fatigue;Patient limited by pain    Behavior During Therapy [facility] for tasks assessed/performed                 Past Medical History:  Diagnosis Date   Anemia    Asthma    as a child, exercise asthma - none since high school"   Cancer (HCC)    skin cancer - basal, squamous- Mylenoma - stage 2- chest   Complication of anesthesia    "very sensitive to medications" "Had colonscopy with just Draminine"   Family history of adverse reaction to anesthesia    daughter- N/V   Hyperlipidemia    Hypertension    PONV (postoperative nausea and vomiting)    Past Surgical History:  Procedure Laterality Date   APPENDECTOMY  2011   Dr. Lemar Livings   COLONOSCOPY WITH PROPOFOL N/A 02/10/2017   Procedure: COLONOSCOPY WITH PROPOFOL;  Surgeon: Kieth Brightly, MD;  Location: [facility] ENDOSCOPY;  Service: Endoscopy;  Laterality: N/A;   ORIF TIBIA PLATEAU Left 08/03/2019   Procedure: OPEN REDUCTION INTERNAL FIXATION (ORIF) TIBIAL PLATEAU;  Surgeon: Myrene Galas, MD;  Location: [facility] OR;  Service: Orthopedics;  Laterality: Left;   Patient Active Problem List   Diagnosis Date Noted   Prostate cancer (HCC) 03/04/2021   Hypertension    Hyperlipidemia    Asthma    Tibial plateau fracture, left, closed, with nonunion, subsequent encounter 08/03/2019   Closed fracture of shaft of tibia 03/28/2019    Retrocalcaneal bursitis, right 04/05/2018   Chronic anemia 11/14/2013   OA (osteoarthritis) 11/14/2013    REFERRING DIAG: 4-part fracture of surgical neck of left humerus  THERAPY DIAG:  Pain in left upper arm  Stiffness of left shoulder, not elsewhere classified  Left shoulder pain, unspecified chronicity  Muscle weakness (generalized)  Rationale for Evaluation and Treatment: Rehabilitation  PERTINENT HISTORY: Patient is a 77 y.o. male who presents to outpatient physical therapy with a referral for medical diagnosis 4-part fracture of surgical neck of left humerus. This patient's chief complaints consist of left upper arm and shoulder pain, shoulder stiffness, and L UE weakness/swelling 2/2 L humeral neck fracture on 07/24/2022 leading to the following functional deficits: difficulty completing any activities that require use of left UE, showering, washing dishes, dressing, hygiene, working out at [employer] for health, sleeping, housework, cooking, throwing balls, some things he does left handed. Relevant past medical history and comorbidities include prostate cancer (dx Fall 2021, 40 radiation treatments in summer 2022, now doing well followed 1x a year), HTN, hyperlipidemia, asthma (as a child), ORIF L tibial Plateau (08/03/2019), OA, appendectomy, skin cancer (removed).  Patient denies hx of stroke, seizures, lung problems, heart problems, diabetes, unexplained weight loss, unexplained changes in bowel or bladder problems, unexplained stumbling or dropping things, osteoporosis, and spinal surgery  PRECAUTIONS: fracture 07/24/2022  SUBJECTIVE:                                                                                                                                                                                      SUBJECTIVE STATEMENT: Patient reports he did his exercises yesterday which made his L shoulder a little sore on the top. Patient states the needling was "wonderful." The next  day he had more motion than he has had in a long time. He states he did the best he has ever done with [facility] pulley yesterday.   PAIN:  NPRS 1/10 left shoulder/upper arm  OBJECTIVE   TODAY'S TREATMENT:   Therapeutic exercise: to centralize symptoms and improve ROM, strength, muscular endurance, and activity tolerance required for successful completion of functional activities.  - Upper body ergometer at level 7 to  encourage joint nutrition, warm tissue, induce analgesic effect of aerobic exercise, improve muscular strength and endurance,  and prepare for remainder of session. 5 min switching directions every 1 minute.    (Manual therapy / dry needling - see below)  - supine AAROM L shoulder flexion with PVC stick loaded with 10#AW, 2x10 with 5 second holds. (150 degrees scaption at the end).  - standing pec/ER stretch in doorway at 90 degrees abduction, 3x30 seconds.  - Standing flexion stretch hip hinge and chest towards the floor while holding both door jambs.  Manual therapy: to reduce pain and tissue tension, improve range of motion, neuromodulation, in order to promote improved ability to complete functional activities. SUPINE - STM to left pectoralis major/minor,  - STM to subscapularis and lat dorsi including sustained pressure.   Modality: Dry needling performed to left periscapular muscles to decrease pain and spasms along patient's L shoulder region and allow for improved ROM with patient in supine utilizing 2 dry needle(s) .30mm x 30mm and 2 dry needle(s) .30mm x 60mm with 3 sticks along L subscapularis and 3 sticks at left latissimus dorsi. Patient educated about the risks and benefits from therapy and verbally consents to treatment. Patient draped appropiately. Dry needling performed by Luretha Murphy. Ilsa Iha PT, DPT who is certified in this technique.   Pt required multimodal cuing for proper technique and to facilitate improved neuromuscular control, strength, range of motion, and  functional ability resulting in improved performance and form.   PATIENT EDUCATION: Education details: Exercise purpose/form. Self management techniques. Dry needling. Reviewed cancelation/no-show policy with patient and confirmed patient has correct phone number for clinic; patient verbalized understanding (10/01/22). Person educated: Patient Education method: Explanation, Demonstration, Tactile cues, Verbal cues, and Handouts Education comprehension: verbalized understanding, returned demonstration, and needs further education  HOME EXERCISE PROGRAM: Access Code: [email or web address] URL: [email or web address] Date: 01/12/2023 Prepared by: Norton Blizzard  Exercises - Supine Shoulder Flexion with Dowel  - 1 x daily - 1 sets - 20 reps - 5 seconds hold - Shoulder Flexion Wall Slide with Towel  - 1 x daily - 20 reps - 5 second hold - Seated Shoulder Abduction AAROM with Pulley Behind  - 1 x daily - 1 sets - 20 reps - 5 seconds hold - Seated Shoulder Internal Rotation AAROM with Pulley (Mirrored)  - 1 x daily - 1 sets - 20 reps - 5 seconds hold - Shoulder ER Stretch in Abduction  - 1 x daily - 2-3 sets - 10 reps - 5-10 hold - Standing Single Arm Row with Resistance Thumb Up  - 3-5 x weekly - 3 sets - 10 reps - Shoulder External Rotation with Anchored Resistance  - 3-5 x weekly - 3 sets - 10 reps - Shoulder Internal Rotation with Resistance  - 3-5 x weekly - 3 sets - 10 reps - Sidelying Shoulder Abduction Palm Forward  - 3-5 x weekly - 3 sets - 10 reps - Sidelying Shoulder Horizontal Abduction  - 3-5 x weekly - 3 sets - 10 reps - Seated Single Arm Bicep Curls Supinated with Dumbbell  - 3-5 x weekly - 3 sets - 10-15 reps - Standing Wall [employer] with Mini Swiss Ball  - 3-5 x weekly - 2 sets - 40 reps - Standing Wall [employer] in Scaption with Mini Swiss Ball  - 3-5 x weekly - 2 sets - 40 reps    ASSESSMENT:   CLINICAL IMPRESSION:   Patient arrives reporting continued improvement  in L UE function and at least temporary improvement in motion after dry needling last PT session. Continued with dry needling today with improved tolerance and ROM in abduction, ER, and overhead motions. Plan to continue working on improving ROM and strength/motor control as appropriate while completing progress note next session.Patient would benefit from continued management of limiting condition by skilled physical therapist to address remaining impairments and functional limitations to work towards stated goals and return to PLOF or maximal functional independence.   From initial PT eval 09/07/2022: Patient is a 77 y.o. male referred to outpatient physical therapy with a medical diagnosis of 4-part fracture of surgical neck of left humerus who presents with signs and symptoms consistent with R shoulder pain, stiffness, and weakness, and L UE swelling 2/2 proximal humerus fracture on 07/24/2022 after FOOSH. Patient presents with significant swelling, pain, joint stiffness, guarding, motor control, muscle tension, muscle performance (strength/power/endurance), swelling, tissue integrity, and activity tolerance impairments that are limiting ability to complete any activities that require use of left UE, showering, washing dishes, dressing, hygiene, working out at [employer] for health, sleeping, housework, cooking, throwing balls, some things he does left handed without difficulty. He currently has significant limitations due to pain with movement overhead with empty end feel with flexion and abduction. Patient will benefit from skilled physical therapy intervention to address current body structure impairments and activity limitations to improve function and work towards goals set in current POC in order to return to prior level of function or maximal functional improvement.    OBJECTIVE IMPAIRMENTS: decreased activity tolerance, decreased coordination, decreased endurance, decreased knowledge of condition,  decreased mobility, decreased ROM, decreased strength, hypomobility, increased edema, increased fascial restrictions, impaired perceived functional ability, increased muscle spasms, impaired flexibility, impaired UE functional use, improper body mechanics, and pain.  ACTIVITY LIMITATIONS: carrying, lifting, sleeping, bathing, dressing, reach over head, hygiene/grooming, and caring for others   PARTICIPATION LIMITATIONS: meal prep, cleaning, interpersonal relationship, shopping, community activity, occupation, yard work, and   difficulty completing any activities that require use of left UE, showering, washing dishes, dressing, hygiene, working out at [employer] for health, sleeping, housework, cooking, throwing balls, some things he does left handed   PERSONAL FACTORS: Age, Past/current experiences, Time since onset of injury/illness/exacerbation, and 3+ comorbidities:   prostate cancer (dx Fall 2021, 40 radiation treatments in summer 2022, now doing well followed 1x a year), HTN, hyperlipidemia, asthma (as a child), ORIF L tibial Plateau (08/03/2019), OA, appendectomy, skin cancer (removed) are also affecting patient's functional outcome.    REHAB POTENTIAL: Good   CLINICAL DECISION MAKING: Stable/uncomplicated   EVALUATION COMPLEXITY: Low     GOALS: Goals reviewed with patient? No   SHORT TERM GOALS: Target date: 09/21/2022   Patient will be independent with initial home exercise program for self-management of symptoms. Baseline: Initial HEP provided at IE (09/07/22); Goal status: MET     LONG TERM GOALS: Target date: 11/30/2022  Updated to 02/08/2023 for all unmet goals on 11/16/2022.    Patient will be independent with a long-term home exercise program for self-management of symptoms.  Baseline: Initial HEP provided at IE (09/07/22); excellent participation (10/08/2022); continues participation as able (11/16/2022; 12/22/2022);  Goal status: In-progress   2.  Patient will demonstrate improved  FOTO to equal or greater than 67 by visit #12 to demonstrate improvement in overall condition and self-reported functional ability.  Baseline: 44 (09/07/22); 61 at visit #8 (10/01/2022); 60 at visit #20 (11/16/2022); 74 at visit #30 (12/22/2022);  Goal status: In-progress   3.  Patient will demonstrate L shoulder AROM equal or greater than 130 degrees flexion, 110 degrees abduction,  80 degrees ER at 90 degrees abduction, and IR to L1 to improve his ability to dress, reach overhead, and throw balls.  Baseline: very limited  - see objective (09/07/22); continues to be limited but improving - see objective (10/08/2022);  similar limitations but less painful  - see objective (11/16/2022); similar limitations but no longer painful - see objective (12/22/2022);  Goal status: In-progress   4.  Patient will demonstrate L shoulder strength equal or greater than 4/5 MMT in flexion, abduction, ER, and IR to improve his ability to reach overhead, throw a ball, and dress. Baseline: testing deferred due to acuity of injury (09/07/22); resistive testing continues to be deferred but see AROM for strength (10/08/2022); lacks AROM but does have good resistance to challenge within available AROM except ER - see above (11/16/2022; 12/22/2022);  Goal status: In-progress   5.  Patient will complete community, work and/or recreational activities with equal or less than 75% limitation due to current condition.  Baseline: difficulty completing any activities that require use of left UE, showering, washing dishes, dressing, hygiene, working out at [employer] for health, sleeping, housework, cooking, throwing balls, some things he does left handed (09/07/22); has noted improvement with ability to use left UE for activities that do not require overhead motion or heavy force such as putting his shirttail in, holding low on the handle of the elliptical machine, reaching for the car door, washing dishes, flossing; still cannot shampoo hair with  left hand (10/08/2022); pt reports he notices more he can do every day (11/16/2022); patient reports he ocntinues to feel improvement daily (12/22/2022);  Goal status: In-progress   PLAN:   PT FREQUENCY:  1-2x/week   PT DURATION: 12 weeks   PLANNED INTERVENTIONS: Therapeutic exercises, Therapeutic activity, Neuromuscular re-education, Balance training, Gait training, Patient/Family education, Self Care, Joint mobilization, Dry Needling, Electrical stimulation, Spinal mobilization, Cryotherapy, Moist heat, Manual therapy, and Re-evaluation   PLAN FOR NEXT SESSION: Update HEP as appropriate, progressive PROM, AAROM, strengthening for posture, shoulder girdle, and UE. Manual therapy for pain control, improved ROM.    Cira Rue, PT, DPT 02/02/2023, 9:46 AM   [facility] [address] [place], [place] [address] P: [phone] I F: [phone]

## 2023-02-04 ENCOUNTER — Ambulatory Visit: Payer: PPO | Admitting: Physical Therapy

## 2023-02-04 ENCOUNTER — Encounter: Payer: Self-pay | Admitting: Physical Therapy

## 2023-02-04 DIAGNOSIS — M25612 Stiffness of left shoulder, not elsewhere classified: Secondary | ICD-10-CM

## 2023-02-04 DIAGNOSIS — M79622 Pain in left upper arm: Secondary | ICD-10-CM

## 2023-02-04 DIAGNOSIS — M25512 Pain in left shoulder: Secondary | ICD-10-CM

## 2023-02-04 DIAGNOSIS — M6281 Muscle weakness (generalized): Secondary | ICD-10-CM

## 2023-02-04 NOTE — Therapy (Signed)
OUTPATIENT PHYSICAL THERAPY TREATMENT / PROGRESS NOTE / RE-CERTIFICATION Dates of reporting from 12/22/2022 to 02/04/2023  Patient Name: Glenn Watson MRN: [id number] DOB:[birth year], 77 y.o., male Today's Date: 02/04/2023  PCP: Glenn Arbour, MD REFERRING PROVIDER: Deeann Saint, MD  END OF SESSION:   PT End of Session - 02/04/23 1043     Visit Number 40    Number of Visits 63    Date for PT Re-Evaluation 04/29/23    Authorization Type [employer] reporting period from 12/17/2022    Progress Note Due on Visit 40    PT Start Time 1037    PT Stop Time 1120    PT Time Calculation (min) 43 min    Activity Tolerance Patient tolerated treatment well;Patient limited by pain    Behavior During Therapy WFL for tasks assessed/performed                  Past Medical History:  Diagnosis Date   Anemia    Asthma    as a child, exercise asthma - none since high school"   Cancer (HCC)    skin cancer - basal, squamous- Mylenoma - stage 2- chest   Complication of anesthesia    "very sensitive to medications" "Had colonscopy with just Glenn Watson"   Family history of adverse reaction to anesthesia    daughter- N/V   Hyperlipidemia    Hypertension    PONV (postoperative nausea and vomiting)    Past Surgical History:  Procedure Laterality Date   APPENDECTOMY  2011   Dr. Lemar Watson   COLONOSCOPY WITH PROPOFOL N/A 02/10/2017   Procedure: COLONOSCOPY WITH PROPOFOL;  Surgeon: Glenn Brightly, MD;  Location: [facility];  Service: Endoscopy;  Laterality: N/A;   ORIF TIBIA PLATEAU Left 08/03/2019   Procedure: OPEN REDUCTION INTERNAL FIXATION (ORIF) TIBIAL PLATEAU;  Surgeon: Glenn Galas, MD;  Location: [facility] OR;  Service: Orthopedics;  Laterality: Left;   Patient Active Problem List   Diagnosis Date Noted   Prostate cancer (HCC) 03/04/2021   Hypertension    Hyperlipidemia    Asthma    Tibial plateau fracture, left, closed, with nonunion, subsequent encounter  08/03/2019   Closed fracture of shaft of tibia 03/28/2019   Retrocalcaneal bursitis, right 04/05/2018   Chronic anemia 11/14/2013   OA (osteoarthritis) 11/14/2013    REFERRING DIAG: 4-part fracture of surgical neck of left humerus  THERAPY DIAG:  Pain in left upper arm  Stiffness of left shoulder, not elsewhere classified  Left shoulder pain, unspecified chronicity  Muscle weakness (generalized)  Rationale for Evaluation and Treatment: Rehabilitation  PERTINENT HISTORY: Patient is a 77 y.o. male who presents to outpatient physical therapy with a referral for medical diagnosis 4-part fracture of surgical neck of left humerus. This patient's chief complaints consist of left upper arm and shoulder pain, shoulder stiffness, and L UE weakness/swelling 2/2 L humeral neck fracture on 07/24/2022 leading to the following functional deficits: difficulty completing any activities that require use of left UE, showering, washing dishes, dressing, hygiene, working out at [employer] for health, sleeping, housework, cooking, throwing balls, some things he does left handed. Relevant past medical history and comorbidities include prostate cancer (dx Fall 2021, 40 radiation treatments in summer 2022, now doing well followed 1x a year), HTN, hyperlipidemia, asthma (as a child), ORIF L tibial Plateau (08/03/2019), OA, appendectomy, skin cancer (removed).  Patient denies hx of stroke, seizures, lung problems, heart problems, diabetes, unexplained weight loss, unexplained changes in bowel or bladder problems, unexplained stumbling  or dropping things, osteoporosis, and spinal surgery   PRECAUTIONS: fracture 07/24/2022  SUBJECTIVE:                                                                                                                                                                                      SUBJECTIVE STATEMENT: Patient reports he feels PT is helping him still and he has improvement in what he can do  for about a day after coming to PT. He has his usual soreness at the left shoulder upon arrival. He continues to participate in HEP, usually twice a week depending on when he comes to PT.   PAIN:  NPRS 1/10 left shoulder/upper arm  OBJECTIVE  SELF-REPORTED FUNCTION FOTO score: 77/100 (upper arm questionnaire)   PERIPHERAL JOINT MOTION (in degrees)   ACTIVE RANGE OF MOTION (AROM) *Indicates pain 09/07/22 10/08/22 11/16/22 12/22/22 02/04/23  Joint/Motion R/L R/L R/L R/L R/L  Shoulder           Flexion 142/50* /70 /73 /78 /80  Extension 72/40* /48* /45 /50 /42  Abduction  155/55* /65* /69* /72 /75  External rotation 60/0 /0 /20 /15 /17  Internal rotation T8/Sacrum* /T12 /T12 /T12 /T12  Elbow           Flexion  134/130 /134 /140 /135   Extension  -6/-8 /-4 /-4 /-3   Comments:  09/07/2022: pulling/discomfort pain at left painful movements. Abduction most painful. Compensatory L shoulder hike with ABD>FLEX.  10/08/2022; 11/16/2022: abduction most painful 02/04/2023: measured prior to dry needling/manual/stretching   PASSIVE RANGE OF MOTION (PROM) *Indicates pain 09/07/22 10/08/22 11/16/22 12/22/22 02/04/23 before dry needling/stretch 02/04/23 After dry needling 02/04/23 After dry needling/stretch  Joint/Motion R/L R/L R/L R/L R/L    Shoulder             Flexion /108* /130* /143* /150* /130* /150* /163*  Extension / / / /     Glenn Watson  /68* /110* /130* /130* /120* /126* /135*  External rotation /28* /18* /45* /15* /20* /45* /50*  Internal rotation /60* /82* /55* /35 /60* /60* /65*  Comments:  09/07/2022: flexion and abduction limited by pain/empty end feel with mild guarding. L shoulder ER/IR at 45 degrees scaption.  10/08/2022: L shoulder ER/IR at 90 degrees abduction 11/16/2022: ER/IR at 45 degrees scaption. IR isolated to [facility] joint. Flexion oin scaption plane sidelying with inferior glide of GHJ.  12/22/2022: pain at lateral shoulder. ER and IR measured at 90 degrees abduction.    MUSCLE PERFORMANCE  (MMT):  *Indicates pain 11/16/22 12/22/22 Date  Joint/Motion R/L R/L R/L  Shoulder        Flexion 5/3+* 5/4 /  Abduction (C5) 5/4 5/4+ /  External rotation 4+/2 4+/2 /  Internal rotation 5/4 5/5 /  Extension / / /  Elbow        Flexion (C6) 5/4 5/4+ /  Extension (C7) 5/4 5/5 /  11/16/2022; 12/22/2022: within available ROM (see AROM above).    Grip strength (in pounds, average of three measures).  09/07/2022:  R: (59+59+65)/3 = 61 L: (34+29+30)/ 3 = 31 10/08/2022:  R: (65+73+65)/3 = 67.7 L: (50+46+46)/ 3 = 47.3 11/16/2022:  R: (67+74+68)/3 = 69.7 L: (54+59+68)/3 = 60.3 12/22/2022:  R: (70+78+72)/3 = 73.3 L: (60+*56+65)/3 = 60.3   TODAY'S TREATMENT:   Therapeutic exercise: to centralize symptoms and improve ROM, strength, muscular endurance, and activity tolerance required for successful completion of functional activities.  - Upper body ergometer at level 7 to  encourage joint nutrition, warm tissue, induce analgesic effect of aerobic exercise, improve muscular strength and endurance,  and prepare for remainder of session. 5 min switching directions every 1 minute.   - AROM measurements to assess progress - see above (Manual therapy / dry needling - see below) - supine AAROM L shoulder flexion with PVC stick loaded with 10#AW, 2x10 with 5 second holds. - Education on HEP to do AAROM flexion in supine with 10# added to stick 2x10 with 5 second hold, 1-2x a day.   Manual therapy: to reduce pain and tissue tension, improve range of motion, neuromodulation, in order to promote improved ability to complete functional activities. SUPINE - STM to left pectoralis major/minor, and lat dorsi - STM to subscapularis including sustained pressure.  - PROM measurements to assess progress taken throughout session (see above)  Modality: Dry needling performed to left periscapular muscles to decrease pain and spasms along patient's L shoulder region and allow for improved ROM with patient in supine  utilizing 1 dry needle(s) .30mm x 30mm with 2 sticks at left pectoralis major and 1 dry needle(s) .30mm x 60mm with 2 sticks along L subscapularis and 1 sticks at left latissimus dorsi. Patient educated about the risks and benefits from therapy and verbally consents to treatment. Patient draped appropiately. Dry needling performed by Luretha Murphy. Ilsa Iha PT, DPT who is certified in this technique.   Pt required multimodal cuing for proper technique and to facilitate improved neuromuscular control, strength, range of motion, and functional ability resulting in improved performance and form.   PATIENT EDUCATION: Education details: Exercise purpose/form. Self management techniques. Dry needling. Reviewed cancelation/no-show policy with patient and confirmed patient has correct phone number for clinic; patient verbalized understanding (10/01/22). Person educated: Patient Education method: Explanation, Demonstration, Tactile cues, Verbal cues, and Handouts Education comprehension: verbalized understanding, returned demonstration, and needs further education   HOME EXERCISE PROGRAM: Access Code: [email or web address] URL: [email or web address] Date: 01/12/2023 Prepared by: Norton Blizzard  Exercises - Supine Shoulder Flexion with Dowel  - 1 x daily - 1 sets - 20 reps - 5 seconds hold - Shoulder Flexion Wall Slide with Towel  - 1 x daily - 20 reps - 5 second hold - Seated Shoulder Abduction AAROM with Pulley Behind  - 1 x daily - 1 sets - 20 reps - 5 seconds hold - Seated Shoulder Internal Rotation AAROM with Pulley (Mirrored)  - 1 x daily - 1 sets - 20 reps - 5 seconds hold - Shoulder ER Stretch in Abduction  - 1 x daily - 2-3 sets - 10 reps - 5-10 hold - Standing Single Arm Row with Resistance Thumb Up  - 3-5 x  weekly - 3 sets - 10 reps - Shoulder External Rotation with Anchored Resistance  - 3-5 x weekly - 3 sets - 10 reps - Shoulder Internal Rotation with Resistance  - 3-5 x weekly - 3 sets - 10  reps - Sidelying Shoulder Abduction Palm Forward  - 3-5 x weekly - 3 sets - 10 reps - Sidelying Shoulder Horizontal Abduction  - 3-5 x weekly - 3 sets - 10 reps - Seated Single Arm Bicep Curls Supinated with Dumbbell  - 3-5 x weekly - 3 sets - 10-15 reps - Standing Wall [employer] with Mini Swiss Ball  - 3-5 x weekly - 2 sets - 40 reps - Standing Wall [employer] in Scaption with Mini Swiss Ball  - 3-5 x weekly - 2 sets - 40 reps    ASSESSMENT:   CLINICAL IMPRESSION:   Patient has attended 40 physical therapy sessions since starting current episode of care on 09/07/2022. Since his last progress note he has not made much progress in AROM or strength but shows significant gains in PROM after dry needling was added to stretching. He continues to have a lot of GHJ stiffness that is limiting his ability to effectively strengthening full ROM. Patient does appear to have further potential for improvement based on the recent and within visit gains in ROM with dry needling added to his treatment plan. Patient educated on the need to stretch overhead daily to improve carry over, and patinet has agreed. Plan to re-assess progress at end of August. Patient would benefit from continued management of limiting condition by skilled physical therapist to address remaining impairments and functional limitations to work towards stated goals and return to PLOF or maximal functional independence.    From initial PT eval 09/07/2022: Patient is a 77 y.o. male referred to outpatient physical therapy with a medical diagnosis of 4-part fracture of surgical neck of left humerus who presents with signs and symptoms consistent with R shoulder pain, stiffness, and weakness, and L UE swelling 2/2 proximal humerus fracture on 07/24/2022 after FOOSH. Patient presents with significant swelling, pain, joint stiffness, guarding, motor control, muscle tension, muscle performance (strength/power/endurance), swelling, tissue integrity, and  activity tolerance impairments that are limiting ability to complete any activities that require use of left UE, showering, washing dishes, dressing, hygiene, working out at [employer] for health, sleeping, housework, cooking, throwing balls, some things he does left handed without difficulty. He currently has significant limitations due to pain with movement overhead with empty end feel with flexion and abduction. Patient will benefit from skilled physical therapy intervention to address current body structure impairments and activity limitations to improve function and work towards goals set in current POC in order to return to prior level of function or maximal functional improvement.    OBJECTIVE IMPAIRMENTS: decreased activity tolerance, decreased coordination, decreased endurance, decreased knowledge of condition, decreased mobility, decreased ROM, decreased strength, hypomobility, increased edema, increased fascial restrictions, impaired perceived functional ability, increased muscle spasms, impaired flexibility, impaired UE functional use, improper body mechanics, and pain.    ACTIVITY LIMITATIONS: carrying, lifting, sleeping, bathing, dressing, reach over head, hygiene/grooming, and caring for others   PARTICIPATION LIMITATIONS: meal prep, cleaning, interpersonal relationship, shopping, community activity, occupation, yard work, and   difficulty completing any activities that require use of left UE, showering, washing dishes, dressing, hygiene, working out at [employer] for health, sleeping, housework, cooking, throwing balls, some things he does left handed   PERSONAL FACTORS: Age, Past/current experiences, Time since  onset of injury/illness/exacerbation, and 3+ comorbidities:   prostate cancer (dx Fall 2021, 40 radiation treatments in summer 2022, now doing well followed 1x a year), HTN, hyperlipidemia, asthma (as a child), ORIF L tibial Plateau (08/03/2019), OA, appendectomy, skin cancer (removed) are  also affecting patient's functional outcome.    REHAB POTENTIAL: Good   CLINICAL DECISION MAKING: Stable/uncomplicated   EVALUATION COMPLEXITY: Low     GOALS: Goals reviewed with patient? No   SHORT TERM GOALS: Target date: 09/21/2022   Patient will be independent with initial home exercise program for self-management of symptoms. Baseline: Initial HEP provided at [facility] (09/07/22); Goal status: MET     LONG TERM GOALS: Target date: 11/30/2022  Updated to 02/08/2023 for all unmet goals on 11/16/2022. Updated to 04/29/2023 for all unmet goals on 02/04/2023.   Patient will be independent with a long-term home exercise program for self-management of symptoms.  Baseline: Initial HEP provided at [facility] (09/07/22); excellent participation (10/08/2022); continues participation as able (11/16/2022; 12/22/2022); participating 2x a week (02/04/2023);  Goal status: In-progress   2.  Patient will demonstrate improved FOTO to equal or greater than 67 by visit #12 to demonstrate improvement in overall condition and self-reported functional ability.  Baseline: 44 (09/07/22); 61 at visit #8 (10/01/2022); 60 at visit #20 (11/16/2022); 74 at visit #30 (12/22/2022); 77 at visit #40 (02/04/2023);  Goal status: Met but not by visit 12   3.  Patient will demonstrate L shoulder AROM equal or greater than 130 degrees flexion, 110 degrees abduction,  80 degrees ER at 90 degrees abduction, and IR to L1 to improve his ability to dress, reach overhead, and throw balls.  Baseline: very limited  - see objective (09/07/22); continues to be limited but improving - see objective (10/08/2022);  similar limitations but less painful  - see objective (11/16/2022); similar limitations but no longer painful - see objective (12/22/2022); similar limitations prior to stretching and dry needling - PROM with significant improvements following needling and stretching - see objective (02/04/2023);  Goal status: In-progress   4.  Patient will demonstrate L  shoulder strength equal or greater than 4/5 MMT in flexion, abduction, ER, and IR to improve his ability to reach overhead, throw a ball, and dress. Baseline: testing deferred due to acuity of injury (09/07/22); resistive testing continues to be deferred but see AROM for strength (10/08/2022); lacks AROM but does have good resistance to challenge within available AROM except ER - see above (11/16/2022; 12/22/2022; 02/04/2023);  Goal status: In-progress   5.  Patient will complete community, work and/or recreational activities with equal or less than 75% limitation due to current condition.  Baseline: difficulty completing any activities that require use of left UE, showering, washing dishes, dressing, hygiene, working out at [employer] for health, sleeping, housework, cooking, throwing balls, some things he does left handed (09/07/22); has noted improvement with ability to use left UE for activities that do not require overhead motion or heavy force such as putting his shirttail in, holding low on the handle of the elliptical machine, reaching for the car door, washing dishes, flossing; still cannot shampoo hair with left hand (10/08/2022); pt reports he notices more he can do every day (11/16/2022); patient reports he ocntinues to feel improvement daily (12/22/2022); patient continues to report improvements after every PT session (02/04/2023);  Goal status: In-progress   PLAN:   PT FREQUENCY: 1-2x/week   PT DURATION: 12 weeks   PLANNED INTERVENTIONS: Therapeutic exercises, Therapeutic activity, Neuromuscular re-education, Balance training, Gait  training, Patient/Family education, Self Care, Joint mobilization, Dry Needling, Electrical stimulation, Spinal mobilization, Cryotherapy, Moist heat, Manual therapy, and Re-evaluation   PLAN FOR NEXT SESSION: Update HEP as appropriate, progressive PROM, AAROM, strengthening for posture, shoulder girdle, and UE. Manual therapy for pain control, improved ROM.    Cira Rue, PT, DPT 02/04/2023, 1:36 PM   [facility] [address] [place], [place] [address] P: [phone] I F: [phone]

## 2023-02-08 ENCOUNTER — Ambulatory Visit: Payer: PPO | Admitting: Physical Therapy

## 2023-02-08 ENCOUNTER — Encounter: Payer: Self-pay | Admitting: Physical Therapy

## 2023-02-08 DIAGNOSIS — M25612 Stiffness of left shoulder, not elsewhere classified: Secondary | ICD-10-CM

## 2023-02-08 DIAGNOSIS — M6281 Muscle weakness (generalized): Secondary | ICD-10-CM

## 2023-02-08 DIAGNOSIS — M79622 Pain in left upper arm: Secondary | ICD-10-CM | POA: Diagnosis not present

## 2023-02-08 DIAGNOSIS — M25512 Pain in left shoulder: Secondary | ICD-10-CM

## 2023-02-08 NOTE — Therapy (Signed)
OUTPATIENT PHYSICAL THERAPY TREATMENT   Patient Name: Glenn Watson MRN: [id number] DOB:1945-11-30, 77 y.o., male Today's Date: 02/08/2023  PCP: Marguarite Arbour, MD REFERRING PROVIDER: Deeann Saint, MD  END OF SESSION:   PT End of Session - 02/08/23 0907     Visit Number 41    Number of Visits 63    Date for PT Re-Evaluation 04/29/23    Authorization Type [employer] reporting period from 02/04/2023    Progress Note Due on Visit 50    PT Start Time 0902    PT Stop Time 0942    PT Time Calculation (min) 40 min    Activity Tolerance Patient tolerated treatment well;Patient limited by pain    Behavior During Therapy [facility] for tasks assessed/performed                   Past Medical History:  Diagnosis Date   Anemia    Asthma    as a child, exercise asthma - none since high school"   Cancer (HCC)    skin cancer - basal, squamous- Mylenoma - stage 2- chest   Complication of anesthesia    "very sensitive to medications" "Had colonscopy with just Draminine"   Family history of adverse reaction to anesthesia    daughter- N/V   Hyperlipidemia    Hypertension    PONV (postoperative nausea and vomiting)    Past Surgical History:  Procedure Laterality Date   APPENDECTOMY  2011   Dr. Lemar Livings   COLONOSCOPY WITH PROPOFOL N/A 02/10/2017   Procedure: COLONOSCOPY WITH PROPOFOL;  Surgeon: Kieth Brightly, MD;  Location: [facility] ENDOSCOPY;  Service: Endoscopy;  Laterality: N/A;   ORIF TIBIA PLATEAU Left 08/03/2019   Procedure: OPEN REDUCTION INTERNAL FIXATION (ORIF) TIBIAL PLATEAU;  Surgeon: Myrene Galas, MD;  Location: [facility] OR;  Service: Orthopedics;  Laterality: Left;   Patient Active Problem List   Diagnosis Date Noted   Prostate cancer (HCC) 03/04/2021   Hypertension    Hyperlipidemia    Asthma    Tibial plateau fracture, left, closed, with nonunion, subsequent encounter 08/03/2019   Closed fracture of shaft of tibia 03/28/2019   Retrocalcaneal bursitis,  right 04/05/2018   Chronic anemia 11/14/2013   OA (osteoarthritis) 11/14/2013    REFERRING DIAG: 4-part fracture of surgical neck of left humerus  THERAPY DIAG:  Pain in left upper arm  Stiffness of left shoulder, not elsewhere classified  Left shoulder pain, unspecified chronicity  Muscle weakness (generalized)  Rationale for Evaluation and Treatment: Rehabilitation  PERTINENT HISTORY: Patient is a 77 y.o. male who presents to outpatient physical therapy with a referral for medical diagnosis 4-part fracture of surgical neck of left humerus. This patient's chief complaints consist of left upper arm and shoulder pain, shoulder stiffness, and L UE weakness/swelling 2/2 L humeral neck fracture on 07/24/2022 leading to the following functional deficits: difficulty completing any activities that require use of left UE, showering, washing dishes, dressing, hygiene, working out at [employer] for health, sleeping, housework, cooking, throwing balls, some things he does left handed. Relevant past medical history and comorbidities include prostate cancer (dx Fall 2021, 40 radiation treatments in summer 2022, now doing well followed 1x a year), HTN, hyperlipidemia, asthma (as a child), ORIF L tibial Plateau (08/03/2019), OA, appendectomy, skin cancer (removed).  Patient denies hx of stroke, seizures, lung problems, heart problems, diabetes, unexplained weight loss, unexplained changes in bowel or bladder problems, unexplained stumbling or dropping things, osteoporosis, and spinal surgery   PRECAUTIONS:  fracture 07/24/2022  SUBJECTIVE:                                                                                                                                                                                      SUBJECTIVE STATEMENT: Patient reports he is feeling well and doing his HEP stretches more often is going well for him. He reports he cannot necessarily feel like he is stretching farther with his  stretching but he states it is easier for him to reach up to the shelf.    PAIN:  NPRS 1/10 left shoulder/upper arm  OBJECTIVE   TODAY'S TREATMENT:   Therapeutic exercise: to centralize symptoms and improve ROM, strength, muscular endurance, and activity tolerance required for successful completion of functional activities.  - Upper body ergometer at level 7 to  encourage joint nutrition, warm tissue, induce analgesic effect of aerobic exercise, improve muscular strength and endurance,  and prepare for remainder of session. 5 min switching directions every 1 minute.   - AROM measurements to assess progress - see above (Manual therapy / dry needling - see below) - supine AAROM L shoulder flexion with PVC stick loaded with 10#AW, 2x10 with 5 second holds. - standing left shoulder ball circles at the wall, 2x30 seconds CW and CCW in flexion and abduction. Needed 30 -60 second rests every 30 seconds of work in abduction and 30 seconds of rest every 60 seconds of work in flexion abduction.   Manual therapy: to reduce pain and tissue tension, improve range of motion, neuromodulation, in order to promote improved ability to complete functional activities. SUPINE - STM to left pectoralis major/minor, and lat dorsi - STM to subscapularis including sustained pressure.   Modality: Dry needling performed to left periscapular muscles to decrease pain and spasms along patient's L shoulder region and allow for improved ROM with patient in supine utilizing 1 dry needle(s) .30mm x 30mm with 2 sticks at left pectoralis major and 1 dry needle(s) .30mm x 60mm with 2 sticks along L subscapularis and 1 sticks at left latissimus dorsi. Patient educated about the risks and benefits from therapy and verbally consents to treatment. Patient draped appropiately. Dry needling performed by Luretha Murphy. Ilsa Iha PT, DPT who is certified in this technique.   Pt required multimodal cuing for proper technique and to facilitate  improved neuromuscular control, strength, range of motion, and functional ability resulting in improved performance and form.   PATIENT EDUCATION: Education details: Exercise purpose/form. Self management techniques. Reviewed cancelation/no-show policy with patient and confirmed patient has correct phone number for clinic; patient verbalized understanding (10/01/22). Person educated: Patient Education method: Explanation, Demonstration, Tactile cues, Verbal cues, and Handouts  Education comprehension: verbalized understanding, returned demonstration, and needs further education   HOME EXERCISE PROGRAM: Access Code: [email or web address] URL: [email or web address] Date: 01/12/2023 Prepared by: Norton Blizzard  Exercises - Supine Shoulder Flexion with Dowel  - 1 x daily - 1 sets - 20 reps - 5 seconds hold - Shoulder Flexion Wall Slide with Towel  - 1 x daily - 20 reps - 5 second hold - Seated Shoulder Abduction AAROM with Pulley Behind  - 1 x daily - 1 sets - 20 reps - 5 seconds hold - Seated Shoulder Internal Rotation AAROM with Pulley (Mirrored)  - 1 x daily - 1 sets - 20 reps - 5 seconds hold - Shoulder ER Stretch in Abduction  - 1 x daily - 2-3 sets - 10 reps - 5-10 hold - Standing Single Arm Row with Resistance Thumb Up  - 3-5 x weekly - 3 sets - 10 reps - Shoulder External Rotation with Anchored Resistance  - 3-5 x weekly - 3 sets - 10 reps - Shoulder Internal Rotation with Resistance  - 3-5 x weekly - 3 sets - 10 reps - Sidelying Shoulder Abduction Palm Forward  - 3-5 x weekly - 3 sets - 10 reps - Sidelying Shoulder Horizontal Abduction  - 3-5 x weekly - 3 sets - 10 reps - Seated Single Arm Bicep Curls Supinated with Dumbbell  - 3-5 x weekly - 3 sets - 10-15 reps - Standing Wall [employer] with Mini Swiss Ball  - 3-5 x weekly - 2 sets - 40 reps - Standing Wall [employer] in Scaption with Mini Swiss Ball  - 3-5 x weekly - 2 sets - 40 reps    ASSESSMENT:   CLINICAL IMPRESSION:    Patient arrived reporting improved participation in HEP stretching and improved ability to reach overhead. Today's session focused on dry needling / manual / stretching to improve ROM and ball on wall stabilization exercises to build strength and endurance for AROM and improved shoulder girdle mechanics. Patient would benefit from continued management of limiting condition by skilled physical therapist to address remaining impairments and functional limitations to work towards stated goals and return to PLOF or maximal functional independence.   From initial PT eval 09/07/2022: Patient is a 77 y.o. male referred to outpatient physical therapy with a medical diagnosis of 4-part fracture of surgical neck of left humerus who presents with signs and symptoms consistent with R shoulder pain, stiffness, and weakness, and L UE swelling 2/2 proximal humerus fracture on 07/24/2022 after FOOSH. Patient presents with significant swelling, pain, joint stiffness, guarding, motor control, muscle tension, muscle performance (strength/power/endurance), swelling, tissue integrity, and activity tolerance impairments that are limiting ability to complete any activities that require use of left UE, showering, washing dishes, dressing, hygiene, working out at [employer] for health, sleeping, housework, cooking, throwing balls, some things he does left handed without difficulty. He currently has significant limitations due to pain with movement overhead with empty end feel with flexion and abduction. Patient will benefit from skilled physical therapy intervention to address current body structure impairments and activity limitations to improve function and work towards goals set in current POC in order to return to prior level of function or maximal functional improvement.    OBJECTIVE IMPAIRMENTS: decreased activity tolerance, decreased coordination, decreased endurance, decreased knowledge of condition, decreased mobility, decreased  ROM, decreased strength, hypomobility, increased edema, increased fascial restrictions, impaired perceived functional ability, increased muscle spasms, impaired flexibility, impaired UE functional use, improper body mechanics, and pain.  ACTIVITY LIMITATIONS: carrying, lifting, sleeping, bathing, dressing, reach over head, hygiene/grooming, and caring for others   PARTICIPATION LIMITATIONS: meal prep, cleaning, interpersonal relationship, shopping, community activity, occupation, yard work, and   difficulty completing any activities that require use of left UE, showering, washing dishes, dressing, hygiene, working out at [employer] for health, sleeping, housework, cooking, throwing balls, some things he does left handed   PERSONAL FACTORS: Age, Past/current experiences, Time since onset of injury/illness/exacerbation, and 3+ comorbidities:   prostate cancer (dx Fall 2021, 40 radiation treatments in summer 2022, now doing well followed 1x a year), HTN, hyperlipidemia, asthma (as a child), ORIF L tibial Plateau (08/03/2019), OA, appendectomy, skin cancer (removed) are also affecting patient's functional outcome.    REHAB POTENTIAL: Good   CLINICAL DECISION MAKING: Stable/uncomplicated   EVALUATION COMPLEXITY: Low     GOALS: Goals reviewed with patient? No   SHORT TERM GOALS: Target date: 09/21/2022   Patient will be independent with initial home exercise program for self-management of symptoms. Baseline: Initial HEP provided at IE (09/07/22); Goal status: MET     LONG TERM GOALS: Target date: 11/30/2022  Updated to 02/08/2023 for all unmet goals on 11/16/2022. Updated to 04/29/2023 for all unmet goals on 02/04/2023.   Patient will be independent with a long-term home exercise program for self-management of symptoms.  Baseline: Initial HEP provided at IE (09/07/22); excellent participation (10/08/2022); continues participation as able (11/16/2022; 12/22/2022); participating 2x a week (02/04/2023);  Goal  status: In-progress   2.  Patient will demonstrate improved FOTO to equal or greater than 67 by visit #12 to demonstrate improvement in overall condition and self-reported functional ability.  Baseline: 44 (09/07/22); 61 at visit #8 (10/01/2022); 60 at visit #20 (11/16/2022); 74 at visit #30 (12/22/2022); 77 at visit #40 (02/04/2023);  Goal status: Met but not by visit 12   3.  Patient will demonstrate L shoulder AROM equal or greater than 130 degrees flexion, 110 degrees abduction,  80 degrees ER at 90 degrees abduction, and IR to L1 to improve his ability to dress, reach overhead, and throw balls.  Baseline: very limited  - see objective (09/07/22); continues to be limited but improving - see objective (10/08/2022);  similar limitations but less painful  - see objective (11/16/2022); similar limitations but no longer painful - see objective (12/22/2022); similar limitations prior to stretching and dry needling - PROM with significant improvements following needling and stretching - see objective (02/04/2023);  Goal status: In-progress   4.  Patient will demonstrate L shoulder strength equal or greater than 4/5 MMT in flexion, abduction, ER, and IR to improve his ability to reach overhead, throw a ball, and dress. Baseline: testing deferred due to acuity of injury (09/07/22); resistive testing continues to be deferred but see AROM for strength (10/08/2022); lacks AROM but does have good resistance to challenge within available AROM except ER - see above (11/16/2022; 12/22/2022; 02/04/2023);  Goal status: In-progress   5.  Patient will complete community, work and/or recreational activities with equal or less than 75% limitation due to current condition.  Baseline: difficulty completing any activities that require use of left UE, showering, washing dishes, dressing, hygiene, working out at [employer] for health, sleeping, housework, cooking, throwing balls, some things he does left handed (09/07/22); has noted  improvement with ability to use left UE for activities that do not require overhead motion or heavy force such as putting his shirttail in, holding low on the handle of the elliptical machine, reaching for  the car door, washing dishes, flossing; still cannot shampoo hair with left hand (10/08/2022); pt reports he notices more he can do every day (11/16/2022); patient reports he ocntinues to feel improvement daily (12/22/2022); patient continues to report improvements after every PT session (02/04/2023);  Goal status: In-progress   PLAN:   PT FREQUENCY: 1-2x/week   PT DURATION: 12 weeks   PLANNED INTERVENTIONS: Therapeutic exercises, Therapeutic activity, Neuromuscular re-education, Balance training, Gait training, Patient/Family education, Self Care, Joint mobilization, Dry Needling, Electrical stimulation, Spinal mobilization, Cryotherapy, Moist heat, Manual therapy, and Re-evaluation   PLAN FOR NEXT SESSION: Update HEP as appropriate, progressive PROM, AAROM, strengthening for posture, shoulder girdle, and UE. Manual therapy for pain control, improved ROM.    Cira Rue, PT, DPT 02/08/2023, 9:43 AM   [facility] [address] [place], [place] [address] P: [phone] I F: [phone]

## 2023-02-10 ENCOUNTER — Encounter: Payer: PPO | Admitting: Physical Therapy

## 2023-02-11 ENCOUNTER — Ambulatory Visit: Payer: PPO | Admitting: Physical Therapy

## 2023-02-11 ENCOUNTER — Encounter: Payer: Self-pay | Admitting: Physical Therapy

## 2023-02-11 DIAGNOSIS — M79622 Pain in left upper arm: Secondary | ICD-10-CM

## 2023-02-11 DIAGNOSIS — M25512 Pain in left shoulder: Secondary | ICD-10-CM

## 2023-02-11 DIAGNOSIS — M6281 Muscle weakness (generalized): Secondary | ICD-10-CM

## 2023-02-11 DIAGNOSIS — M25612 Stiffness of left shoulder, not elsewhere classified: Secondary | ICD-10-CM

## 2023-02-11 NOTE — Therapy (Signed)
OUTPATIENT PHYSICAL THERAPY TREATMENT   Patient Name: Glenn Watson MRN: [id number] DOB:09-23-1945, 77 y.o., male Today's Date: 02/11/2023  PCP: Marguarite Arbour, MD REFERRING PROVIDER: Deeann Saint, MD  END OF SESSION:   PT End of Session - 02/11/23 1428     Visit Number 42    Number of Visits 63    Date for PT Re-Evaluation 04/29/23    Authorization Type [employer] reporting period from 02/04/2023    Progress Note Due on Visit 50    PT Start Time 0943    PT Stop Time 1035    PT Time Calculation (min) 52 min    Activity Tolerance Patient tolerated treatment well;Patient limited by pain    Behavior During Therapy [facility] for tasks assessed/performed              Past Medical History:  Diagnosis Date   Anemia    Asthma    as a child, exercise asthma - none since high school"   Cancer (HCC)    skin cancer - basal, squamous- Mylenoma - stage 2- chest   Complication of anesthesia    "very sensitive to medications" "Had colonscopy with just Draminine"   Family history of adverse reaction to anesthesia    daughter- N/V   Hyperlipidemia    Hypertension    PONV (postoperative nausea and vomiting)    Past Surgical History:  Procedure Laterality Date   APPENDECTOMY  2011   Dr. Lemar Livings   COLONOSCOPY WITH PROPOFOL N/A 02/10/2017   Procedure: COLONOSCOPY WITH PROPOFOL;  Surgeon: Kieth Brightly, MD;  Location: [facility] ENDOSCOPY;  Service: Endoscopy;  Laterality: N/A;   ORIF TIBIA PLATEAU Left 08/03/2019   Procedure: OPEN REDUCTION INTERNAL FIXATION (ORIF) TIBIAL PLATEAU;  Surgeon: Myrene Galas, MD;  Location: [facility] OR;  Service: Orthopedics;  Laterality: Left;   Patient Active Problem List   Diagnosis Date Noted   Prostate cancer (HCC) 03/04/2021   Hypertension    Hyperlipidemia    Asthma    Tibial plateau fracture, left, closed, with nonunion, subsequent encounter 08/03/2019   Closed fracture of shaft of tibia 03/28/2019   Retrocalcaneal bursitis, right  04/05/2018   Chronic anemia 11/14/2013   OA (osteoarthritis) 11/14/2013    REFERRING DIAG: 4-part fracture of surgical neck of left humerus  THERAPY DIAG:  Pain in left upper arm  Stiffness of left shoulder, not elsewhere classified  Left shoulder pain, unspecified chronicity  Muscle weakness (generalized)  Rationale for Evaluation and Treatment: Rehabilitation  PERTINENT HISTORY: Patient is a 77 y.o. male who presents to outpatient physical therapy with a referral for medical diagnosis 4-part fracture of surgical neck of left humerus. This patient's chief complaints consist of left upper arm and shoulder pain, shoulder stiffness, and L UE weakness/swelling 2/2 L humeral neck fracture on 07/24/2022 leading to the following functional deficits: difficulty completing any activities that require use of left UE, showering, washing dishes, dressing, hygiene, working out at [employer] for health, sleeping, housework, cooking, throwing balls, some things he does left handed. Relevant past medical history and comorbidities include prostate cancer (dx Fall 2021, 40 radiation treatments in summer 2022, now doing well followed 1x a year), HTN, hyperlipidemia, asthma (as a child), ORIF L tibial Plateau (08/03/2019), OA, appendectomy, skin cancer (removed).  Patient denies hx of stroke, seizures, lung problems, heart problems, diabetes, unexplained weight loss, unexplained changes in bowel or bladder problems, unexplained stumbling or dropping things, osteoporosis, and spinal surgery   PRECAUTIONS: fracture 07/24/2022  SUBJECTIVE:  SUBJECTIVE STATEMENT: Patient reports he is feeling well and doing his HEP stretches more often is going well for him. He reports he cannot necessarily feel like he is stretching farther with his stretching  but he states it is easier for him to reach up to the shelf.    PAIN:  NPRS 1/10 left shoulder/upper arm  OBJECTIVE   L shoulder PROM flexion  prior to needling: 135 degrees After needling: 155 degrees  TODAY'S TREATMENT:   Therapeutic exercise: to centralize symptoms and improve ROM, strength, muscular endurance, and activity tolerance required for successful completion of functional activities.  - Upper body ergometer at level 7 to  encourage joint nutrition, warm tissue, induce analgesic effect of aerobic exercise, improve muscular strength and endurance,  and prepare for remainder of session. 5 min switching directions every 1 minute.    Manual therapy: to reduce pain and tissue tension, improve range of motion, neuromodulation, in order to promote improved ability to complete functional activities. SUPINE - STM to left pectoralis major/minor, and lat dorsi - STM to subscapularis including sustained pressure.  - education, demonstration, and supervision of patient's wife (a retired Adult nurse) in how to perform STM to pt's left subscapularis and joint mobilizations to left GHJ with the following techniques: AP glide at 30 degrees abduction, caudal glide at 90 degrees abduction-scaption, caudal glide in maximal overhead ROM tolerable, AP glide with full IR to pre- tension posterior capsule and PRONE PA glide with full ER to pre-tension anterior capsule. PT performed/demonstrated technique, wife performed technique with cuing from PT, PT performed again with wife taking video, patient providing feedback.   Modality: Dry needling performed to left periscapular muscles to decrease pain and spasms along patient's L shoulder region and allow for improved ROM with patient in supine utilizing 1 dry needle(s) .30mm x 30mm with 2 sticks at left pectoralis major and 1 dry needle(s) .30mm x 60mm with 3 sticks along L subscapularis and 1 sticks at left latissimus dorsi. Patient educated about  the risks and benefits from therapy and verbally consents to treatment. Patient draped appropiately. Dry needling performed by Luretha Murphy. Ilsa Iha PT, DPT who is certified in this technique.   Pt required multimodal cuing for proper technique and to facilitate improved neuromuscular control, strength, range of motion, and functional ability resulting in improved performance and form.   PATIENT EDUCATION: Education details: Exercise purpose/form. Self management techniques. Reviewed cancelation/no-show policy with patient and confirmed patient has correct phone number for clinic; patient verbalized understanding (10/01/22). Person educated: Patient Education method: Explanation, Demonstration, Tactile cues, Verbal cues, and Handouts Education comprehension: verbalized understanding, returned demonstration, and needs further education   HOME EXERCISE PROGRAM: Access Code: [email or web address] URL: [email or web address] Date: 01/12/2023 Prepared by: Norton Blizzard  Exercises - Supine Shoulder Flexion with Dowel  - 1 x daily - 1 sets - 20 reps - 5 seconds hold - Shoulder Flexion Wall Slide with Towel  - 1 x daily - 20 reps - 5 second hold - Seated Shoulder Abduction AAROM with Pulley Behind  - 1 x daily - 1 sets - 20 reps - 5 seconds hold - Seated Shoulder Internal Rotation AAROM with Pulley (Mirrored)  - 1 x daily - 1 sets - 20 reps - 5 seconds hold - Shoulder ER Stretch in Abduction  - 1 x daily - 2-3 sets - 10 reps - 5-10 hold - Standing Single Arm Row with Resistance Thumb Up  - 3-5 x weekly - 3 sets - 10 reps -  Shoulder External Rotation with Anchored Resistance  - 3-5 x weekly - 3 sets - 10 reps - Shoulder Internal Rotation with Resistance  - 3-5 x weekly - 3 sets - 10 reps - Sidelying Shoulder Abduction Palm Forward  - 3-5 x weekly - 3 sets - 10 reps - Sidelying Shoulder Horizontal Abduction  - 3-5 x weekly - 3 sets - 10 reps - Seated Single Arm Bicep Curls Supinated with Dumbbell  - 3-5 x  weekly - 3 sets - 10-15 reps - Standing Wall [employer] with Mini Swiss Ball  - 3-5 x weekly - 2 sets - 40 reps - Standing Wall [employer] in Scaption with Mini Swiss Ball  - 3-5 x weekly - 2 sets - 40 reps    ASSESSMENT:   CLINICAL IMPRESSION:   Patient arrived reporting continued improved participation in HEP. Today's session continued to focus on dry needling and manual techniques to improve ROM which improved 20 degrees in flexion with dry needling, and also to teach patient's wife who is a retired PT how to perform joint mobilizations and STM similar to what patient has found helpful in the clinic for use when he is discharged from formal PT. Patient tolerated session well and was provided with video demonstrations of how to perform the mobs. Plan to continue next session with focus on improved ROM and strength. Patient was educated on traffic light guide to movement safe pain to help guide him while attempting to increase his activity level.  Patient would benefit from continued management of limiting condition by skilled physical therapist to address remaining impairments and functional limitations to work towards stated goals and return to PLOF or maximal functional independence.     From initial PT eval 09/07/2022: Patient is a 77 y.o. male referred to outpatient physical therapy with a medical diagnosis of 4-part fracture of surgical neck of left humerus who presents with signs and symptoms consistent with R shoulder pain, stiffness, and weakness, and L UE swelling 2/2 proximal humerus fracture on 07/24/2022 after FOOSH. Patient presents with significant swelling, pain, joint stiffness, guarding, motor control, muscle tension, muscle performance (strength/power/endurance), swelling, tissue integrity, and activity tolerance impairments that are limiting ability to complete any activities that require use of left UE, showering, washing dishes, dressing, hygiene, working out at [employer] for  health, sleeping, housework, cooking, throwing balls, some things he does left handed without difficulty. He currently has significant limitations due to pain with movement overhead with empty end feel with flexion and abduction. Patient will benefit from skilled physical therapy intervention to address current body structure impairments and activity limitations to improve function and work towards goals set in current POC in order to return to prior level of function or maximal functional improvement.    OBJECTIVE IMPAIRMENTS: decreased activity tolerance, decreased coordination, decreased endurance, decreased knowledge of condition, decreased mobility, decreased ROM, decreased strength, hypomobility, increased edema, increased fascial restrictions, impaired perceived functional ability, increased muscle spasms, impaired flexibility, impaired UE functional use, improper body mechanics, and pain.    ACTIVITY LIMITATIONS: carrying, lifting, sleeping, bathing, dressing, reach over head, hygiene/grooming, and caring for others   PARTICIPATION LIMITATIONS: meal prep, cleaning, interpersonal relationship, shopping, community activity, occupation, yard work, and   difficulty completing any activities that require use of left UE, showering, washing dishes, dressing, hygiene, working out at [employer] for health, sleeping, housework, cooking, throwing balls, some things he does left handed   PERSONAL FACTORS: Age, Past/current experiences, Time since onset of  injury/illness/exacerbation, and 3+ comorbidities:   prostate cancer (dx Fall 2021, 40 radiation treatments in summer 2022, now doing well followed 1x a year), HTN, hyperlipidemia, asthma (as a child), ORIF L tibial Plateau (08/03/2019), OA, appendectomy, skin cancer (removed) are also affecting patient's functional outcome.    REHAB POTENTIAL: Good   CLINICAL DECISION MAKING: Stable/uncomplicated   EVALUATION COMPLEXITY: Low     GOALS: Goals reviewed with  patient? No   SHORT TERM GOALS: Target date: 09/21/2022   Patient will be independent with initial home exercise program for self-management of symptoms. Baseline: Initial HEP provided at IE (09/07/22); Goal status: MET     LONG TERM GOALS: Target date: 11/30/2022  Updated to 02/08/2023 for all unmet goals on 11/16/2022. Updated to 04/29/2023 for all unmet goals on 02/04/2023.   Patient will be independent with a long-term home exercise program for self-management of symptoms.  Baseline: Initial HEP provided at IE (09/07/22); excellent participation (10/08/2022); continues participation as able (11/16/2022; 12/22/2022); participating 2x a week (02/04/2023);  Goal status: In-progress   2.  Patient will demonstrate improved FOTO to equal or greater than 67 by visit #12 to demonstrate improvement in overall condition and self-reported functional ability.  Baseline: 44 (09/07/22); 61 at visit #8 (10/01/2022); 60 at visit #20 (11/16/2022); 74 at visit #30 (12/22/2022); 77 at visit #40 (02/04/2023);  Goal status: Met but not by visit 12   3.  Patient will demonstrate L shoulder AROM equal or greater than 130 degrees flexion, 110 degrees abduction,  80 degrees ER at 90 degrees abduction, and IR to L1 to improve his ability to dress, reach overhead, and throw balls.  Baseline: very limited  - see objective (09/07/22); continues to be limited but improving - see objective (10/08/2022);  similar limitations but less painful  - see objective (11/16/2022); similar limitations but no longer painful - see objective (12/22/2022); similar limitations prior to stretching and dry needling - PROM with significant improvements following needling and stretching - see objective (02/04/2023);  Goal status: In-progress   4.  Patient will demonstrate L shoulder strength equal or greater than 4/5 MMT in flexion, abduction, ER, and IR to improve his ability to reach overhead, throw a ball, and dress. Baseline: testing deferred due to acuity  of injury (09/07/22); resistive testing continues to be deferred but see AROM for strength (10/08/2022); lacks AROM but does have good resistance to challenge within available AROM except ER - see above (11/16/2022; 12/22/2022; 02/04/2023);  Goal status: In-progress   5.  Patient will complete community, work and/or recreational activities with equal or less than 75% limitation due to current condition.  Baseline: difficulty completing any activities that require use of left UE, showering, washing dishes, dressing, hygiene, working out at [employer] for health, sleeping, housework, cooking, throwing balls, some things he does left handed (09/07/22); has noted improvement with ability to use left UE for activities that do not require overhead motion or heavy force such as putting his shirttail in, holding low on the handle of the elliptical machine, reaching for the car door, washing dishes, flossing; still cannot shampoo hair with left hand (10/08/2022); pt reports he notices more he can do every day (11/16/2022); patient reports he ocntinues to feel improvement daily (12/22/2022); patient continues to report improvements after every PT session (02/04/2023);  Goal status: In-progress   PLAN:   PT FREQUENCY: 1-2x/week   PT DURATION: 12 weeks   PLANNED INTERVENTIONS: Therapeutic exercises, Therapeutic activity, Neuromuscular re-education, Balance training, Gait training, Patient/Family  education, Self Care, Joint mobilization, Dry Needling, Electrical stimulation, Spinal mobilization, Cryotherapy, Moist heat, Manual therapy, and Re-evaluation   PLAN FOR NEXT SESSION: Update HEP as appropriate, progressive PROM, AAROM, strengthening for posture, shoulder girdle, and UE. Manual therapy for pain control, improved ROM.    Cira Rue, PT, DPT 02/11/2023, 2:36 PM   [facility] [address] [place], [place] [address] P: [phone] I F: [phone]

## 2023-02-15 ENCOUNTER — Encounter: Payer: Self-pay | Admitting: Physical Therapy

## 2023-02-15 ENCOUNTER — Ambulatory Visit: Payer: PPO | Admitting: Physical Therapy

## 2023-02-15 DIAGNOSIS — M25612 Stiffness of left shoulder, not elsewhere classified: Secondary | ICD-10-CM

## 2023-02-15 DIAGNOSIS — M25512 Pain in left shoulder: Secondary | ICD-10-CM

## 2023-02-15 DIAGNOSIS — M79622 Pain in left upper arm: Secondary | ICD-10-CM | POA: Diagnosis not present

## 2023-02-15 DIAGNOSIS — M6281 Muscle weakness (generalized): Secondary | ICD-10-CM

## 2023-02-15 NOTE — Therapy (Signed)
OUTPATIENT PHYSICAL THERAPY TREATMENT   Patient Name: Glenn Watson MRN: [id number] DOB:December 20, 1945, 77 y.o., male Today's Date: 02/15/2023  PCP: Marguarite Arbour, MD REFERRING PROVIDER: Deeann Saint, MD  END OF SESSION:   PT End of Session - 02/15/23 1121     Visit Number 43    Number of Visits 63    Date for PT Re-Evaluation 04/29/23    Authorization Type [employer] reporting period from 02/04/2023    Progress Note Due on Visit 50    PT Start Time 1116    PT Stop Time 1202    PT Time Calculation (min) 46 min    Activity Tolerance Patient tolerated treatment well;Patient limited by pain    Behavior During Therapy WFL for tasks assessed/performed               Past Medical History:  Diagnosis Date   Anemia    Asthma    as a child, exercise asthma - none since high school"   Cancer (HCC)    skin cancer - basal, squamous- Mylenoma - stage 2- chest   Complication of anesthesia    "very sensitive to medications" "Had colonscopy with just Draminine"   Family history of adverse reaction to anesthesia    daughter- N/V   Hyperlipidemia    Hypertension    PONV (postoperative nausea and vomiting)    Past Surgical History:  Procedure Laterality Date   APPENDECTOMY  2011   Dr. Lemar Livings   COLONOSCOPY WITH PROPOFOL N/A 02/10/2017   Procedure: COLONOSCOPY WITH PROPOFOL;  Surgeon: Kieth Brightly, MD;  Location: [facility] END[facility];  Service: Endoscopy;  Laterality: N/A;   ORIF TIBIA PLATEAU Left 08/03/2019   Procedure: OPEN REDUCTION INTERNAL FIXATION (ORIF) TIBIAL PLATEAU;  Surgeon: Myrene Galas, MD;  Location: [facility] OR;  Service: Orthopedics;  Laterality: Left;   Patient Active Problem List   Diagnosis Date Noted   Prostate cancer (HCC) 03/04/2021   Hypertension    Hyperlipidemia    Asthma    Tibial plateau fracture, left, closed, with nonunion, subsequent encounter 08/03/2019   Closed fracture of shaft of tibia 03/28/2019   Retrocalcaneal bursitis, right  04/05/2018   Chronic anemia 11/14/2013   OA (osteoarthritis) 11/14/2013    REFERRING DIAG: 4-part fracture of surgical neck of left humerus  THERAPY DIAG:  Pain in left upper arm  Stiffness of left shoulder, not elsewhere classified  Left shoulder pain, unspecified chronicity  Muscle weakness (generalized)  Rationale for Evaluation and Treatment: Rehabilitation  PERTINENT HISTORY: Patient is a 77 y.o. male who presents to outpatient physical therapy with a referral for medical diagnosis 4-part fracture of surgical neck of left humerus. This patient's chief complaints consist of left upper arm and shoulder pain, shoulder stiffness, and L UE weakness/swelling 2/2 L humeral neck fracture on 07/24/2022 leading to the following functional deficits: difficulty completing any activities that require use of left UE, showering, washing dishes, dressing, hygiene, working out at [employer] for health, sleeping, housework, cooking, throwing balls, some things he does left handed. Relevant past medical history and comorbidities include prostate cancer (dx Fall 2021, 40 radiation treatments in summer 2022, now doing well followed 1x a year), HTN, hyperlipidemia, asthma (as a child), ORIF L tibial Plateau (08/03/2019), OA, appendectomy, skin cancer (removed).  Patient denies hx of stroke, seizures, lung problems, heart problems, diabetes, unexplained weight loss, unexplained changes in bowel or bladder problems, unexplained stumbling or dropping things, osteoporosis, and spinal surgery   PRECAUTIONS: fracture 07/24/2022  SUBJECTIVE:  SUBJECTIVE STATEMENT: Patient reports he is feeling well. He states he was sore after last PT session. He thinks the ball exercises at home make him sore, but not so he cannot sleep. His ability to use his  left UE is staying about the same.   PAIN:  NPRS 1/10 left shoulder/upper arm  OBJECTIVE   L shoulder PROM flexion  prior to needling: 140 degrees After needling/manual/stretching: 160 degrees  TODAY'S TREATMENT:   Therapeutic exercise: to centralize symptoms and improve ROM, strength, muscular endurance, and activity tolerance required for successful completion of functional activities.  - NuStep level 4 using bilateral upper and lower extremities. Seat/handle setting 14/15. For improved extremity mobility, muscular endurance, and activity tolerance; and to induce the analgesic effect of aerobic exercise, stimulate improved joint nutrition, and prepare body structures and systems for following interventions. x 5  minutes. Average SPM = 73 - wall slides AAROM with L UE, 1x12, needed R UE assist first few reps. (Manual therapy / dry needling - see below) - wall slides AAROM with L UE 1x5 with 5-10 second hold.  - standing left shoulder ball circles at the wall, 1x30 seconds CW and CCW in flexion and abduction. Needed 30 second rests every 30 seconds of work.   Manual therapy: to reduce pain and tissue tension, improve range of motion, neuromodulation, in order to promote improved ability to complete functional activities. SUPINE - STM to left pectoralis major/minor, and lat dorsi - STM to subscapularis including sustained pressure.   Modality: Dry needling performed to left periscapular muscles to decrease pain and spasms along patient's L shoulder region and allow for improved ROM with patient in supine utilizing 1 dry needle(s) .30mm x 30mm with 2 sticks at left pectoralis major and 1 dry needle(s) .30mm x 60mm with 2 sticks along L subscapularis and 2 sticks at left latissimus dorsi. Patient educated about the risks and benefits from therapy and verbally consents to treatment. Patient draped appropiately. Dry needling performed by Luretha Murphy. Ilsa Iha PT, DPT who is certified in this  technique.   Pt required multimodal cuing for proper technique and to facilitate improved neuromuscular control, strength, range of motion, and functional ability resulting in improved performance and form.   PATIENT EDUCATION: Education details: Exercise purpose/form. Self management techniques. Reviewed cancelation/no-show policy with patient and confirmed patient has correct phone number for clinic; patient verbalized understanding (10/01/22). Person educated: Patient Education method: Explanation, Demonstration, Tactile cues, Verbal cues, and Handouts Education comprehension: verbalized understanding, returned demonstration, and needs further education   HOME EXERCISE PROGRAM: Access Code: [email or web address] URL: [email or web address] Date: 01/12/2023 Prepared by: Norton Blizzard  Exercises - Supine Shoulder Flexion with Dowel  - 1 x daily - 1 sets - 20 reps - 5 seconds hold - Shoulder Flexion Wall Slide with Towel  - 1 x daily - 20 reps - 5 second hold - Seated Shoulder Abduction AAROM with Pulley Behind  - 1 x daily - 1 sets - 20 reps - 5 seconds hold - Seated Shoulder Internal Rotation AAROM with Pulley (Mirrored)  - 1 x daily - 1 sets - 20 reps - 5 seconds hold - Shoulder ER Stretch in Abduction  - 1 x daily - 2-3 sets - 10 reps - 5-10 hold - Standing Single Arm Row with Resistance Thumb Up  - 3-5 x weekly - 3 sets - 10 reps - Shoulder External Rotation with Anchored Resistance  - 3-5 x weekly - 3 sets - 10 reps - Shoulder Internal Rotation  with Resistance  - 3-5 x weekly - 3 sets - 10 reps - Sidelying Shoulder Abduction Palm Forward  - 3-5 x weekly - 3 sets - 10 reps - Sidelying Shoulder Horizontal Abduction  - 3-5 x weekly - 3 sets - 10 reps - Seated Single Arm Bicep Curls Supinated with Dumbbell  - 3-5 x weekly - 3 sets - 10-15 reps - Standing Wall [employer] with Mini Swiss Ball  - 3-5 x weekly - 2 sets - 40 reps - Standing Wall [employer] in Scaption with Mini Swiss  Ball  - 3-5 x weekly - 2 sets - 40 reps    ASSESSMENT:   CLINICAL IMPRESSION:   Patient arrived reporting continued participation in HEP but minimal change in function. He continues to find wall slide and ball on the wall exercises challenging. Today's session again focused on improving ROM and got good improvement in PROM pre and post dry needling, manual therapy, and stretching that improved his ability to do wall slides but he continues to have profound weakness in moving overhead. Patient would benefit from continued management of limiting condition by skilled physical therapist to address remaining impairments and functional limitations to work towards stated goals and return to PLOF or maximal functional independence.   From initial PT eval 09/07/2022: Patient is a 77 y.o. male referred to outpatient physical therapy with a medical diagnosis of 4-part fracture of surgical neck of left humerus who presents with signs and symptoms consistent with R shoulder pain, stiffness, and weakness, and L UE swelling 2/2 proximal humerus fracture on 07/24/2022 after FOOSH. Patient presents with significant swelling, pain, joint stiffness, guarding, motor control, muscle tension, muscle performance (strength/power/endurance), swelling, tissue integrity, and activity tolerance impairments that are limiting ability to complete any activities that require use of left UE, showering, washing dishes, dressing, hygiene, working out at [employer] for health, sleeping, housework, cooking, throwing balls, some things he does left handed without difficulty. He currently has significant limitations due to pain with movement overhead with empty end feel with flexion and abduction. Patient will benefit from skilled physical therapy intervention to address current body structure impairments and activity limitations to improve function and work towards goals set in current POC in order to return to prior level of function or maximal  functional improvement.    OBJECTIVE IMPAIRMENTS: decreased activity tolerance, decreased coordination, decreased endurance, decreased knowledge of condition, decreased mobility, decreased ROM, decreased strength, hypomobility, increased edema, increased fascial restrictions, impaired perceived functional ability, increased muscle spasms, impaired flexibility, impaired UE functional use, improper body mechanics, and pain.    ACTIVITY LIMITATIONS: carrying, lifting, sleeping, bathing, dressing, reach over head, hygiene/grooming, and caring for others   PARTICIPATION LIMITATIONS: meal prep, cleaning, interpersonal relationship, shopping, community activity, occupation, yard work, and   difficulty completing any activities that require use of left UE, showering, washing dishes, dressing, hygiene, working out at [employer] for health, sleeping, housework, cooking, throwing balls, some things he does left handed   PERSONAL FACTORS: Age, Past/current experiences, Time since onset of injury/illness/exacerbation, and 3+ comorbidities:   prostate cancer (dx Fall 2021, 40 radiation treatments in summer 2022, now doing well followed 1x a year), HTN, hyperlipidemia, asthma (as a child), ORIF L tibial Plateau (08/03/2019), OA, appendectomy, skin cancer (removed) are also affecting patient's functional outcome.    REHAB POTENTIAL: Good   CLINICAL DECISION MAKING: Stable/uncomplicated   EVALUATION COMPLEXITY: Low     GOALS: Goals reviewed with patient? No   SHORT TERM  GOALS: Target date: 09/21/2022   Patient will be independent with initial home exercise program for self-management of symptoms. Baseline: Initial HEP provided at [facility] (09/07/22); Goal status: MET     LONG TERM GOALS: Target date: 11/30/2022  Updated to 02/08/2023 for all unmet goals on 11/16/2022. Updated to 04/29/2023 for all unmet goals on 02/04/2023.   Patient will be independent with a long-term home exercise program for self-management of  symptoms.  Baseline: Initial HEP provided at [facility] (09/07/22); excellent participation (10/08/2022); continues participation as able (11/16/2022; 12/22/2022); participating 2x a week (02/04/2023);  Goal status: In-progress   2.  Patient will demonstrate improved FOTO to equal or greater than 67 by visit #12 to demonstrate improvement in overall condition and self-reported functional ability.  Baseline: 44 (09/07/22); 61 at visit #8 (10/01/2022); 60 at visit #20 (11/16/2022); 74 at visit #30 (12/22/2022); 77 at visit #40 (02/04/2023);  Goal status: Met but not by visit 12   3.  Patient will demonstrate L shoulder AROM equal or greater than 130 degrees flexion, 110 degrees abduction,  80 degrees ER at 90 degrees abduction, and IR to L1 to improve his ability to dress, reach overhead, and throw balls.  Baseline: very limited  - see objective (09/07/22); continues to be limited but improving - see objective (10/08/2022);  similar limitations but less painful  - see objective (11/16/2022); similar limitations but no longer painful - see objective (12/22/2022); similar limitations prior to stretching and dry needling - PROM with significant improvements following needling and stretching - see objective (02/04/2023);  Goal status: In-progress   4.  Patient will demonstrate L shoulder strength equal or greater than 4/5 MMT in flexion, abduction, ER, and IR to improve his ability to reach overhead, throw a ball, and dress. Baseline: testing deferred due to acuity of injury (09/07/22); resistive testing continues to be deferred but see AROM for strength (10/08/2022); lacks AROM but does have good resistance to challenge within available AROM except ER - see above (11/16/2022; 12/22/2022; 02/04/2023);  Goal status: In-progress   5.  Patient will complete community, work and/or recreational activities with equal or less than 75% limitation due to current condition.  Baseline: difficulty completing any activities that require use of  left UE, showering, washing dishes, dressing, hygiene, working out at [employer] for health, sleeping, housework, cooking, throwing balls, some things he does left handed (09/07/22); has noted improvement with ability to use left UE for activities that do not require overhead motion or heavy force such as putting his shirttail in, holding low on the handle of the elliptical machine, reaching for the car door, washing dishes, flossing; still cannot shampoo hair with left hand (10/08/2022); pt reports he notices more he can do every day (11/16/2022); patient reports he ocntinues to feel improvement daily (12/22/2022); patient continues to report improvements after every PT session (02/04/2023);  Goal status: In-progress   PLAN:   PT FREQUENCY: 1-2x/week   PT DURATION: 12 weeks   PLANNED INTERVENTIONS: Therapeutic exercises, Therapeutic activity, Neuromuscular re-education, Balance training, Gait training, Patient/Family education, Self Care, Joint mobilization, Dry Needling, Electrical stimulation, Spinal mobilization, Cryotherapy, Moist heat, Manual therapy, and Re-evaluation   PLAN FOR NEXT SESSION: Update HEP as appropriate, progressive PROM, AAROM, strengthening for posture, shoulder girdle, and UE. Manual therapy for pain control, improved ROM.    Cira Rue, PT, DPT 02/15/2023, 12:02 PM   [facility] [address] [place], [place] [address] P: [phone] I F: [phone]

## 2023-02-16 ENCOUNTER — Encounter: Payer: PPO | Admitting: Physical Therapy

## 2023-02-18 ENCOUNTER — Encounter: Payer: Self-pay | Admitting: Physical Therapy

## 2023-02-18 ENCOUNTER — Ambulatory Visit: Payer: PPO | Admitting: Physical Therapy

## 2023-02-18 DIAGNOSIS — M79622 Pain in left upper arm: Secondary | ICD-10-CM

## 2023-02-18 DIAGNOSIS — M25612 Stiffness of left shoulder, not elsewhere classified: Secondary | ICD-10-CM

## 2023-02-18 DIAGNOSIS — M25512 Pain in left shoulder: Secondary | ICD-10-CM

## 2023-02-18 DIAGNOSIS — M6281 Muscle weakness (generalized): Secondary | ICD-10-CM

## 2023-02-18 NOTE — Therapy (Signed)
OUTPATIENT PHYSICAL THERAPY TREATMENT   Patient Name: Glenn Watson MRN: [id number] DOB:06/02/1946, 77 y.o., male Today's Date: 02/18/2023  PCP: Marguarite Arbour, MD REFERRING PROVIDER: Deeann Saint, MD  END OF SESSION:   PT End of Session - 02/18/23 1050     Visit Number 44    Number of Visits 63    Date for PT Re-Evaluation 04/29/23    Authorization Type [employer] reporting period from 02/04/2023    Progress Note Due on Visit 50    PT Start Time 1032    PT Stop Time 1112    PT Time Calculation (min) 40 min    Activity Tolerance Patient tolerated treatment well;Patient limited by pain    Behavior During Therapy WFL for tasks assessed/performed            Past Medical History:  Diagnosis Date   Anemia    Asthma    as a child, exercise asthma - none since high school"   Cancer (HCC)    skin cancer - basal, squamous- Mylenoma - stage 2- chest   Complication of anesthesia    "very sensitive to medications" "Had colonscopy with just Draminine"   Family history of adverse reaction to anesthesia    daughter- N/V   Hyperlipidemia    Hypertension    PONV (postoperative nausea and vomiting)    Past Surgical History:  Procedure Laterality Date   APPENDECTOMY  2011   Dr. Lemar Livings   COLONOSCOPY WITH PROPOFOL N/A 02/10/2017   Procedure: COLONOSCOPY WITH PROPOFOL;  Surgeon: Kieth Brightly, MD;  Location: [facility];  Service: Endoscopy;  Laterality: N/A;   ORIF TIBIA PLATEAU Left 08/03/2019   Procedure: OPEN REDUCTION INTERNAL FIXATION (ORIF) TIBIAL PLATEAU;  Surgeon: Myrene Galas, MD;  Location: [facility] OR;  Service: Orthopedics;  Laterality: Left;   Patient Active Problem List   Diagnosis Date Noted   Prostate cancer (HCC) 03/04/2021   Hypertension    Hyperlipidemia    Asthma    Tibial plateau fracture, left, closed, with nonunion, subsequent encounter 08/03/2019   Closed fracture of shaft of tibia 03/28/2019   Retrocalcaneal bursitis, right  04/05/2018   Chronic anemia 11/14/2013   OA (osteoarthritis) 11/14/2013    REFERRING DIAG: 4-part fracture of surgical neck of left humerus  THERAPY DIAG:  Pain in left upper arm  Stiffness of left shoulder, not elsewhere classified  Left shoulder pain, unspecified chronicity  Muscle weakness (generalized)  Rationale for Evaluation and Treatment: Rehabilitation  PERTINENT HISTORY: Patient is a 77 y.o. male who presents to outpatient physical therapy with a referral for medical diagnosis 4-part fracture of surgical neck of left humerus. This patient's chief complaints consist of left upper arm and shoulder pain, shoulder stiffness, and L UE weakness/swelling 2/2 L humeral neck fracture on 07/24/2022 leading to the following functional deficits: difficulty completing any activities that require use of left UE, showering, washing dishes, dressing, hygiene, working out at [employer] for health, sleeping, housework, cooking, throwing balls, some things he does left handed. Relevant past medical history and comorbidities include prostate cancer (dx Fall 2021, 40 radiation treatments in summer 2022, now doing well followed 1x a year), HTN, hyperlipidemia, asthma (as a child), ORIF L tibial Plateau (08/03/2019), OA, appendectomy, skin cancer (removed).  Patient denies hx of stroke, seizures, lung problems, heart problems, diabetes, unexplained weight loss, unexplained changes in bowel or bladder problems, unexplained stumbling or dropping things, osteoporosis, and spinal surgery   PRECAUTIONS: fracture 07/24/2022  SUBJECTIVE:  SUBJECTIVE STATEMENT: Patient reports he is feeling well with his usual mild discomfort in the left shoulder. He felt okay after last PT session.   PAIN:  NPRS 1/10 left shoulder/upper arm  OBJECTIVE    L shoulder PROM flexion  prior to needling: 135 degrees After stretching: 160 degrees  TODAY'S TREATMENT:   Therapeutic exercise: to centralize symptoms and improve ROM, strength, muscular endurance, and activity tolerance required for successful completion of functional activities.  - Upper body ergometer level 7 to encourage joint nutrition, warm tissue, induce analgesic effect of aerobic exercise, improve muscular strength and endurance,  and prepare for remainder of session. 5 minutes total changing directions every 1 minute.  - supine AAROM L shoulder flexion with PVC stick loaded with 10#, 2x10 with 5 second  - standing AAROM L shoulder flexion with UE ranger, 1x15 with 10 second holds.  - standing L shoulder scaption with UE ranger, 1x10 with 10 second holds.  - lower trap setting with AAROM for L shoulder using PVC stick 1x10.  - lower trap setting with AAROM for L shoulder using BlueTB 2x10.  - Education on HEP including handout    Pt required multimodal cuing for proper technique and to facilitate improved neuromuscular control, strength, range of motion, and functional ability resulting in improved performance and form.   PATIENT EDUCATION: Education details: Exercise purpose/form. Self management techniques. Reviewed cancelation/no-show policy with patient and confirmed patient has correct phone number for clinic; patient verbalized understanding (10/01/22). Person educated: Patient Education method: Explanation, Demonstration, Tactile cues, Verbal cues, and Handouts Education comprehension: verbalized understanding, returned demonstration, and needs further education   HOME EXERCISE PROGRAM: Access Code: [email or web address] URL: [email or web address] Date: 02/18/2023 Prepared by: Norton Blizzard  Exercises - Supine Shoulder Flexion with Dowel  - 1 x daily - 1 sets - 20 reps - 5 seconds hold - Shoulder Flexion Wall Slide with Towel  - 1 x daily - 20 reps - 5 second  hold - Seated Shoulder Abduction AAROM with Pulley Behind  - 1 x daily - 1 sets - 20 reps - 5 seconds hold - Seated Shoulder Internal Rotation AAROM with Pulley (Mirrored)  - 1 x daily - 1 sets - 20 reps - 5 seconds hold - Shoulder ER Stretch in Abduction  - 1 x daily - 2-3 sets - 10 reps - 5-10 hold - Standing Single Arm Row with Resistance Thumb Up  - 3-5 x weekly - 3 sets - 10 reps - Shoulder External Rotation with Anchored Resistance  - 3-5 x weekly - 3 sets - 10 reps - Shoulder Internal Rotation with Resistance  - 3-5 x weekly - 3 sets - 10 reps - Sidelying Shoulder Abduction Palm Forward  - 3-5 x weekly - 3 sets - 10 reps - Sidelying Shoulder Horizontal Abduction  - 3-5 x weekly - 3 sets - 10 reps - Seated Single Arm Bicep Curls Supinated with Dumbbell  - 3-5 x weekly - 3 sets - 10-15 reps - Standing Wall [employer] with Mini Swiss Ball  - 3-5 x weekly - 2 sets - 40 reps - Standing Wall [employer] in Scaption with Mini Swiss Ball  - 3-5 x weekly - 2 sets - 40 reps - Standing Low Trap Setting with Resistance at Wall  - 3-5 x weekly - 3 sets - 10 reps    ASSESSMENT:   CLINICAL IMPRESSION:   Patient arrived reporting continued participation in HEP but minimal change in function. He continues to find  wall slide and ball on the wall exercises challenging. Today's session again focused on improving ROM and got good improvement in PROM pre and post dry needling, manual therapy, and stretching that improved his ability to do wall slides but he continues to have profound weakness in moving overhead. Patient would benefit from continued management of limiting condition by skilled physical therapist to address remaining impairments and functional limitations to work towards stated goals and return to PLOF or maximal functional independence.   From initial PT eval 09/07/2022: Patient is a 77 y.o. male referred to outpatient physical therapy with a medical diagnosis of 4-part fracture of surgical  neck of left humerus who presents with signs and symptoms consistent with R shoulder pain, stiffness, and weakness, and L UE swelling 2/2 proximal humerus fracture on 07/24/2022 after FOOSH. Patient presents with significant swelling, pain, joint stiffness, guarding, motor control, muscle tension, muscle performance (strength/power/endurance), swelling, tissue integrity, and activity tolerance impairments that are limiting ability to complete any activities that require use of left UE, showering, washing dishes, dressing, hygiene, working out at [employer] for health, sleeping, housework, cooking, throwing balls, some things he does left handed without difficulty. He currently has significant limitations due to pain with movement overhead with empty end feel with flexion and abduction. Patient will benefit from skilled physical therapy intervention to address current body structure impairments and activity limitations to improve function and work towards goals set in current POC in order to return to prior level of function or maximal functional improvement.    OBJECTIVE IMPAIRMENTS: decreased activity tolerance, decreased coordination, decreased endurance, decreased knowledge of condition, decreased mobility, decreased ROM, decreased strength, hypomobility, increased edema, increased fascial restrictions, impaired perceived functional ability, increased muscle spasms, impaired flexibility, impaired UE functional use, improper body mechanics, and pain.    ACTIVITY LIMITATIONS: carrying, lifting, sleeping, bathing, dressing, reach over head, hygiene/grooming, and caring for others   PARTICIPATION LIMITATIONS: meal prep, cleaning, interpersonal relationship, shopping, community activity, occupation, yard work, and   difficulty completing any activities that require use of left UE, showering, washing dishes, dressing, hygiene, working out at [employer] for health, sleeping, housework, cooking, throwing balls, some  things he does left handed   PERSONAL FACTORS: Age, Past/current experiences, Time since onset of injury/illness/exacerbation, and 3+ comorbidities:   prostate cancer (dx Fall 2021, 40 radiation treatments in summer 2022, now doing well followed 1x a year), HTN, hyperlipidemia, asthma (as a child), ORIF L tibial Plateau (08/03/2019), OA, appendectomy, skin cancer (removed) are also affecting patient's functional outcome.    REHAB POTENTIAL: Good   CLINICAL DECISION MAKING: Stable/uncomplicated   EVALUATION COMPLEXITY: Low     GOALS: Goals reviewed with patient? No   SHORT TERM GOALS: Target date: 09/21/2022   Patient will be independent with initial home exercise program for self-management of symptoms. Baseline: Initial HEP provided at [facility] (09/07/22); Goal status: MET     LONG TERM GOALS: Target date: 11/30/2022  Updated to 02/08/2023 for all unmet goals on 11/16/2022. Updated to 04/29/2023 for all unmet goals on 02/04/2023.   Patient will be independent with a long-term home exercise program for self-management of symptoms.  Baseline: Initial HEP provided at [facility] (09/07/22); excellent participation (10/08/2022); continues participation as able (11/16/2022; 12/22/2022); participating 2x a week (02/04/2023);  Goal status: In-progress   2.  Patient will demonstrate improved FOTO to equal or greater than 67 by visit #12 to demonstrate improvement in overall condition and self-reported functional ability.  Baseline: 44 (09/07/22); 61 at  visit #8 (10/01/2022); 60 at visit #20 (11/16/2022); 74 at visit #30 (12/22/2022); 77 at visit #40 (02/04/2023);  Goal status: Met but not by visit 12   3.  Patient will demonstrate L shoulder AROM equal or greater than 130 degrees flexion, 110 degrees abduction,  80 degrees ER at 90 degrees abduction, and IR to L1 to improve his ability to dress, reach overhead, and throw balls.  Baseline: very limited  - see objective (09/07/22); continues to be limited but improving - see  objective (10/08/2022);  similar limitations but less painful  - see objective (11/16/2022); similar limitations but no longer painful - see objective (12/22/2022); similar limitations prior to stretching and dry needling - PROM with significant improvements following needling and stretching - see objective (02/04/2023);  Goal status: In-progress   4.  Patient will demonstrate L shoulder strength equal or greater than 4/5 MMT in flexion, abduction, ER, and IR to improve his ability to reach overhead, throw a ball, and dress. Baseline: testing deferred due to acuity of injury (09/07/22); resistive testing continues to be deferred but see AROM for strength (10/08/2022); lacks AROM but does have good resistance to challenge within available AROM except ER - see above (11/16/2022; 12/22/2022; 02/04/2023);  Goal status: In-progress   5.  Patient will complete community, work and/or recreational activities with equal or less than 75% limitation due to current condition.  Baseline: difficulty completing any activities that require use of left UE, showering, washing dishes, dressing, hygiene, working out at [employer] for health, sleeping, housework, cooking, throwing balls, some things he does left handed (09/07/22); has noted improvement with ability to use left UE for activities that do not require overhead motion or heavy force such as putting his shirttail in, holding low on the handle of the elliptical machine, reaching for the car door, washing dishes, flossing; still cannot shampoo hair with left hand (10/08/2022); pt reports he notices more he can do every day (11/16/2022); patient reports he ocntinues to feel improvement daily (12/22/2022); patient continues to report improvements after every PT session (02/04/2023);  Goal status: In-progress   PLAN:   PT FREQUENCY: 1-2x/week   PT DURATION: 12 weeks   PLANNED INTERVENTIONS: Therapeutic exercises, Therapeutic activity, Neuromuscular re-education, Balance training,  Gait training, Patient/Family education, Self Care, Joint mobilization, Dry Needling, Electrical stimulation, Spinal mobilization, Cryotherapy, Moist heat, Manual therapy, and Re-evaluation   PLAN FOR NEXT SESSION: Update HEP as appropriate, progressive PROM, AAROM, strengthening for posture, shoulder girdle, and UE. Manual therapy for pain control, improved ROM.    Cira Rue, PT, DPT 02/18/2023, 11:13 AM   [facility] [address] [place], [place] [address] P: [phone] I F: [phone]

## 2023-02-22 ENCOUNTER — Ambulatory Visit: Payer: PPO | Admitting: Physical Therapy

## 2023-02-22 ENCOUNTER — Encounter: Payer: Self-pay | Admitting: Physical Therapy

## 2023-02-22 DIAGNOSIS — M25612 Stiffness of left shoulder, not elsewhere classified: Secondary | ICD-10-CM

## 2023-02-22 DIAGNOSIS — M79622 Pain in left upper arm: Secondary | ICD-10-CM

## 2023-02-22 DIAGNOSIS — M25512 Pain in left shoulder: Secondary | ICD-10-CM

## 2023-02-22 DIAGNOSIS — M6281 Muscle weakness (generalized): Secondary | ICD-10-CM

## 2023-02-22 NOTE — Therapy (Signed)
OUTPATIENT PHYSICAL THERAPY TREATMENT   Patient Name: Glenn Watson MRN: [id number] DOB:21-Jun-1946, 77 y.o., male Today's Date: 02/22/2023  PCP: Marguarite Arbour, MD REFERRING PROVIDER: Deeann Saint, MD  END OF SESSION:   PT End of Session - 02/22/23 0951     Visit Number 45    Number of Visits 63    Date for PT Re-Evaluation 04/29/23    Authorization Type [employer] reporting period from 02/04/2023    Progress Note Due on Visit 50    PT Start Time 0950    PT Stop Time 1028    PT Time Calculation (min) 38 min    Activity Tolerance Patient tolerated treatment well;Patient limited by pain    Behavior During Therapy [facility] for tasks assessed/performed             Past Medical History:  Diagnosis Date   Anemia    Asthma    as a child, exercise asthma - none since high school"   Cancer (HCC)    skin cancer - basal, squamous- Mylenoma - stage 2- chest   Complication of anesthesia    "very sensitive to medications" "Had colonscopy with just Draminine"   Family history of adverse reaction to anesthesia    daughter- N/V   Hyperlipidemia    Hypertension    PONV (postoperative nausea and vomiting)    Past Surgical History:  Procedure Laterality Date   APPENDECTOMY  2011   Dr. Lemar Livings   COLONOSCOPY WITH PROPOFOL N/A 02/10/2017   Procedure: COLONOSCOPY WITH PROPOFOL;  Surgeon: Kieth Brightly, MD;  Location: [facility] ENDOSCOPY;  Service: Endoscopy;  Laterality: N/A;   ORIF TIBIA PLATEAU Left 08/03/2019   Procedure: OPEN REDUCTION INTERNAL FIXATION (ORIF) TIBIAL PLATEAU;  Surgeon: Myrene Galas, MD;  Location: [facility] OR;  Service: Orthopedics;  Laterality: Left;   Patient Active Problem List   Diagnosis Date Noted   Prostate cancer (HCC) 03/04/2021   Hypertension    Hyperlipidemia    Asthma    Tibial plateau fracture, left, closed, with nonunion, subsequent encounter 08/03/2019   Closed fracture of shaft of tibia 03/28/2019   Retrocalcaneal bursitis, right  04/05/2018   Chronic anemia 11/14/2013   OA (osteoarthritis) 11/14/2013    REFERRING DIAG: 4-part fracture of surgical neck of left humerus  THERAPY DIAG:  Pain in left upper arm  Stiffness of left shoulder, not elsewhere classified  Left shoulder pain, unspecified chronicity  Muscle weakness (generalized)  Rationale for Evaluation and Treatment: Rehabilitation  PERTINENT HISTORY: Patient is a 77 y.o. male who presents to outpatient physical therapy with a referral for medical diagnosis 4-part fracture of surgical neck of left humerus. This patient's chief complaints consist of left upper arm and shoulder pain, shoulder stiffness, and L UE weakness/swelling 2/2 L humeral neck fracture on 07/24/2022 leading to the following functional deficits: difficulty completing any activities that require use of left UE, showering, washing dishes, dressing, hygiene, working out at [employer] for health, sleeping, housework, cooking, throwing balls, some things he does left handed. Relevant past medical history and comorbidities include prostate cancer (dx Fall 2021, 40 radiation treatments in summer 2022, now doing well followed 1x a year), HTN, hyperlipidemia, asthma (as a child), ORIF L tibial Plateau (08/03/2019), OA, appendectomy, skin cancer (removed).  Patient denies hx of stroke, seizures, lung problems, heart problems, diabetes, unexplained weight loss, unexplained changes in bowel or bladder problems, unexplained stumbling or dropping things, osteoporosis, and spinal surgery   PRECAUTIONS: fracture 07/24/2022  SUBJECTIVE:  SUBJECTIVE STATEMENT: Patient reports he is feeling well and his L shoulder continues to be about the same. He states he continues to be encouraged with how he is doing. He states he was able to do the lower  trap setting with the band, but not without torso compensations. He states his wife measured his L shoulder flexion AAROM at 155 degrees (measured with a goniometer they have) after stretching with the weighted stick.   PAIN:  NPRS 1/10 left shoulder/upper arm  OBJECTIVE  TODAY'S TREATMENT:   Therapeutic exercise: to centralize symptoms and improve ROM, strength, muscular endurance, and activity tolerance required for successful completion of functional activities.  - Upper body ergometer level 7 to encourage joint nutrition, warm tissue, induce analgesic effect of aerobic exercise, improve muscular strength and endurance,  and prepare for remainder of session. 5 minutes total changing directions every 1 minute.  - supine AAROM L shoulder flexion with PVC stick loaded with 10#, 3x10 with 5 second. - lower trap setting with AAROM for L shoulder using BlueTB 3x10 with 5 second holds.  - seated L shoulder lateral raise from approximately  80 degrees abduction resting on a table, 3x10 with 5 second holds.   Pt required multimodal cuing for proper technique and to facilitate improved neuromuscular control, strength, range of motion, and functional ability resulting in improved performance and form.   PATIENT EDUCATION: Education details: Exercise purpose/form. Self management techniques. Reviewed cancelation/no-show policy with patient and confirmed patient has correct phone number for clinic; patient verbalized understanding (10/01/22). Person educated: Patient Education method: Explanation, Demonstration, Tactile cues, Verbal cues, and Handouts Education comprehension: verbalized understanding, returned demonstration, and needs further education   HOME EXERCISE PROGRAM: Access Code: [email or web address] URL: [email or web address] Date: 02/18/2023 Prepared by: Norton Blizzard  Exercises - Supine Shoulder Flexion with Dowel  - 1 x daily - 1 sets - 20 reps - 5 seconds hold - Shoulder Flexion  Wall Slide with Towel  - 1 x daily - 20 reps - 5 second hold - Seated Shoulder Abduction AAROM with Pulley Behind  - 1 x daily - 1 sets - 20 reps - 5 seconds hold - Seated Shoulder Internal Rotation AAROM with Pulley (Mirrored)  - 1 x daily - 1 sets - 20 reps - 5 seconds hold - Shoulder ER Stretch in Abduction  - 1 x daily - 2-3 sets - 10 reps - 5-10 hold - Standing Single Arm Row with Resistance Thumb Up  - 3-5 x weekly - 3 sets - 10 reps - Shoulder External Rotation with Anchored Resistance  - 3-5 x weekly - 3 sets - 10 reps - Shoulder Internal Rotation with Resistance  - 3-5 x weekly - 3 sets - 10 reps - Sidelying Shoulder Abduction Palm Forward  - 3-5 x weekly - 3 sets - 10 reps - Sidelying Shoulder Horizontal Abduction  - 3-5 x weekly - 3 sets - 10 reps - Seated Single Arm Bicep Curls Supinated with Dumbbell  - 3-5 x weekly - 3 sets - 10-15 reps - Standing Wall [employer] with Mini Swiss Ball  - 3-5 x weekly - 2 sets - 40 reps - Standing Wall [employer] in [place] with Mini Swiss Ball  - 3-5 x weekly - 2 sets - 40 reps - Standing Low Trap Setting with Resistance at Wall  - 3-5 x weekly - 3 sets - 10 reps    ASSESSMENT:   CLINICAL IMPRESSION:   Patient arrived reporting good participation in stretching and  strengthening HEP.  Patient continues to demonstrate excellent effort in the clinic but struggles with strength and form with the left shoulder suggesting non-functional supraspinatus and infraspinatus producing poor glenohumeral rhythm and insufficient force coupling for normal function of the left shoulder. Session focused on maximizing ROM and deltoid strengthening to improve function. Patient would benefit from continued management of limiting condition by skilled physical therapist to address remaining impairments and functional limitations to work towards stated goals and return to PLOF or maximal functional independence..  From initial PT eval 09/07/2022: Patient is a 77 y.o.  male referred to outpatient physical therapy with a medical diagnosis of 4-part fracture of surgical neck of left humerus who presents with signs and symptoms consistent with R shoulder pain, stiffness, and weakness, and L UE swelling 2/2 proximal humerus fracture on 07/24/2022 after FOOSH. Patient presents with significant swelling, pain, joint stiffness, guarding, motor control, muscle tension, muscle performance (strength/power/endurance), swelling, tissue integrity, and activity tolerance impairments that are limiting ability to complete any activities that require use of left UE, showering, washing dishes, dressing, hygiene, working out at [employer] for health, sleeping, housework, cooking, throwing balls, some things he does left handed without difficulty. He currently has significant limitations due to pain with movement overhead with empty end feel with flexion and abduction. Patient will benefit from skilled physical therapy intervention to address current body structure impairments and activity limitations to improve function and work towards goals set in current POC in order to return to prior level of function or maximal functional improvement.    OBJECTIVE IMPAIRMENTS: decreased activity tolerance, decreased coordination, decreased endurance, decreased knowledge of condition, decreased mobility, decreased ROM, decreased strength, hypomobility, increased edema, increased fascial restrictions, impaired perceived functional ability, increased muscle spasms, impaired flexibility, impaired UE functional use, improper body mechanics, and pain.    ACTIVITY LIMITATIONS: carrying, lifting, sleeping, bathing, dressing, reach over head, hygiene/grooming, and caring for others   PARTICIPATION LIMITATIONS: meal prep, cleaning, interpersonal relationship, shopping, community activity, occupation, yard work, and   difficulty completing any activities that require use of left UE, showering, washing dishes, dressing,  hygiene, working out at [employer] for health, sleeping, housework, cooking, throwing balls, some things he does left handed   PERSONAL FACTORS: Age, Past/current experiences, Time since onset of injury/illness/exacerbation, and 3+ comorbidities:   prostate cancer (dx Fall 2021, 40 radiation treatments in summer 2022, now doing well followed 1x a year), HTN, hyperlipidemia, asthma (as a child), ORIF L tibial Plateau (08/03/2019), OA, appendectomy, skin cancer (removed) are also affecting patient's functional outcome.    REHAB POTENTIAL: Good   CLINICAL DECISION MAKING: Stable/uncomplicated   EVALUATION COMPLEXITY: Low     GOALS: Goals reviewed with patient? No   SHORT TERM GOALS: Target date: 09/21/2022   Patient will be independent with initial home exercise program for self-management of symptoms. Baseline: Initial HEP provided at IE (09/07/22); Goal status: MET     LONG TERM GOALS: Target date: 11/30/2022  Updated to 02/08/2023 for all unmet goals on 11/16/2022. Updated to 04/29/2023 for all unmet goals on 02/04/2023.   Patient will be independent with a long-term home exercise program for self-management of symptoms.  Baseline: Initial HEP provided at IE (09/07/22); excellent participation (10/08/2022); continues participation as able (11/16/2022; 12/22/2022); participating 2x a week (02/04/2023);  Goal status: In-progress   2.  Patient will demonstrate improved FOTO to equal or greater than 67 by visit #12 to demonstrate improvement in overall condition and self-reported functional ability.  Baseline: 44 (  09/07/22); 61 at visit #8 (10/01/2022); 60 at visit #20 (11/16/2022); 74 at visit #30 (12/22/2022); 77 at visit #40 (02/04/2023);  Goal status: Met but not by visit 12   3.  Patient will demonstrate L shoulder AROM equal or greater than 130 degrees flexion, 110 degrees abduction,  80 degrees ER at 90 degrees abduction, and IR to L1 to improve his ability to dress, reach overhead, and throw balls.   Baseline: very limited  - see objective (09/07/22); continues to be limited but improving - see objective (10/08/2022);  similar limitations but less painful  - see objective (11/16/2022); similar limitations but no longer painful - see objective (12/22/2022); similar limitations prior to stretching and dry needling - PROM with significant improvements following needling and stretching - see objective (02/04/2023);  Goal status: In-progress   4.  Patient will demonstrate L shoulder strength equal or greater than 4/5 MMT in flexion, abduction, ER, and IR to improve his ability to reach overhead, throw a ball, and dress. Baseline: testing deferred due to acuity of injury (09/07/22); resistive testing continues to be deferred but see AROM for strength (10/08/2022); lacks AROM but does have good resistance to challenge within available AROM except ER - see above (11/16/2022; 12/22/2022; 02/04/2023);  Goal status: In-progress   5.  Patient will complete community, work and/or recreational activities with equal or less than 75% limitation due to current condition.  Baseline: difficulty completing any activities that require use of left UE, showering, washing dishes, dressing, hygiene, working out at [employer] for health, sleeping, housework, cooking, throwing balls, some things he does left handed (09/07/22); has noted improvement with ability to use left UE for activities that do not require overhead motion or heavy force such as putting his shirttail in, holding low on the handle of the elliptical machine, reaching for the car door, washing dishes, flossing; still cannot shampoo hair with left hand (10/08/2022); pt reports he notices more he can do every day (11/16/2022); patient reports he ocntinues to feel improvement daily (12/22/2022); patient continues to report improvements after every PT session (02/04/2023);  Goal status: In-progress   PLAN:   PT FREQUENCY: 1-2x/week   PT DURATION: 12 weeks   PLANNED  INTERVENTIONS: Therapeutic exercises, Therapeutic activity, Neuromuscular re-education, Balance training, Gait training, Patient/Family education, Self Care, Joint mobilization, Dry Needling, Electrical stimulation, Spinal mobilization, Cryotherapy, Moist heat, Manual therapy, and Re-evaluation   PLAN FOR NEXT SESSION: Update HEP as appropriate, progressive PROM, AAROM, strengthening for posture, shoulder girdle, and UE. Manual therapy for pain control, improved ROM.    Cira Rue, PT, DPT 02/22/2023, 10:29 AM   [facility] [address] [place], [place] [address] P: [phone] I F: [phone]

## 2023-02-23 ENCOUNTER — Encounter: Payer: PPO | Admitting: Physical Therapy

## 2023-02-24 ENCOUNTER — Encounter: Payer: Self-pay | Admitting: Physical Therapy

## 2023-02-24 ENCOUNTER — Ambulatory Visit: Payer: PPO | Admitting: Physical Therapy

## 2023-02-24 DIAGNOSIS — M25612 Stiffness of left shoulder, not elsewhere classified: Secondary | ICD-10-CM

## 2023-02-24 DIAGNOSIS — M79622 Pain in left upper arm: Secondary | ICD-10-CM

## 2023-02-24 DIAGNOSIS — M6281 Muscle weakness (generalized): Secondary | ICD-10-CM

## 2023-02-24 DIAGNOSIS — M25512 Pain in left shoulder: Secondary | ICD-10-CM

## 2023-02-24 NOTE — Therapy (Signed)
OUTPATIENT PHYSICAL THERAPY TREATMENT / DISCHARGE SUMMARY Dates of reporting from 09/07/2022 to 02/24/2023  Patient Name: Glenn Watson MRN: [id number] DOB:August 31, 1945, 77 y.o., male Today's Date: 02/24/2023  PCP: Marguarite Arbour, MD REFERRING PROVIDER: Deeann Saint, MD  END OF SESSION:   PT End of Session - 02/24/23 0903     Visit Number 46    Number of Visits 63    Date for PT Re-Evaluation 04/29/23    Authorization Type [employer] reporting period from 02/04/2023    Progress Note Due on Visit 50    PT Start Time 0901    PT Stop Time 0940    PT Time Calculation (min) 39 min    Activity Tolerance Patient tolerated treatment well;Patient limited by pain    Behavior During Therapy [facility] for tasks assessed/performed              Past Medical History:  Diagnosis Date   Anemia    Asthma    as a child, exercise asthma - none since high school"   Cancer (HCC)    skin cancer - basal, squamous- Mylenoma - stage 2- chest   Complication of anesthesia    "very sensitive to medications" "Had colonscopy with just Draminine"   Family history of adverse reaction to anesthesia    daughter- N/V   Hyperlipidemia    Hypertension    PONV (postoperative nausea and vomiting)    Past Surgical History:  Procedure Laterality Date   APPENDECTOMY  2011   Dr. Lemar Livings   COLONOSCOPY WITH PROPOFOL N/A 02/10/2017   Procedure: COLONOSCOPY WITH PROPOFOL;  Surgeon: Kieth Brightly, MD;  Location: [facility] ENDOSCOPY;  Service: Endoscopy;  Laterality: N/A;   ORIF TIBIA PLATEAU Left 08/03/2019   Procedure: OPEN REDUCTION INTERNAL FIXATION (ORIF) TIBIAL PLATEAU;  Surgeon: Myrene Galas, MD;  Location: [facility] OR;  Service: Orthopedics;  Laterality: Left;   Patient Active Problem List   Diagnosis Date Noted   Prostate cancer (HCC) 03/04/2021   Hypertension    Hyperlipidemia    Asthma    Tibial plateau fracture, left, closed, with nonunion, subsequent encounter 08/03/2019   Closed fracture  of shaft of tibia 03/28/2019   Retrocalcaneal bursitis, right 04/05/2018   Chronic anemia 11/14/2013   OA (osteoarthritis) 11/14/2013    REFERRING DIAG: 4-part fracture of surgical neck of left humerus  THERAPY DIAG:  Pain in left upper arm  Stiffness of left shoulder, not elsewhere classified  Left shoulder pain, unspecified chronicity  Muscle weakness (generalized)  Rationale for Evaluation and Treatment: Rehabilitation  PERTINENT HISTORY: Patient is a 77 y.o. male who presents to outpatient physical therapy with a referral for medical diagnosis 4-part fracture of surgical neck of left humerus. This patient's chief complaints consist of left upper arm and shoulder pain, shoulder stiffness, and L UE weakness/swelling 2/2 L humeral neck fracture on 07/24/2022 leading to the following functional deficits: difficulty completing any activities that require use of left UE, showering, washing dishes, dressing, hygiene, working out at [employer] for health, sleeping, housework, cooking, throwing balls, some things he does left handed. Relevant past medical history and comorbidities include prostate cancer (dx Fall 2021, 40 radiation treatments in summer 2022, now doing well followed 1x a year), HTN, hyperlipidemia, asthma (as a child), ORIF L tibial Plateau (08/03/2019), OA, appendectomy, skin cancer (removed).  Patient denies hx of stroke, seizures, lung problems, heart problems, diabetes, unexplained weight loss, unexplained changes in bowel or bladder problems, unexplained stumbling or dropping things, osteoporosis, and spinal  surgery   PRECAUTIONS: fracture 07/24/2022  SUBJECTIVE:                                                                                                                                                                                      SUBJECTIVE STATEMENT: Patient reports he is feeling incredibly tired today after being kept up late last night. He states he did his HEP  yesterday and they were easier than when he did them in the clinic on  Monday. He states he carried an 8 foot step ladder with his left UE yesterday. He agrees he is ready to discharge to a long term HEP for independent management. He reports feeling comfortable with his current HEP for long term use with no questions.   PAIN:  NPRS 1/10 left shoulder/upper arm  OBJECTIVE  SELF-REPORTED FUNCTION FOTO score: 83/100 (upper arm questionnaire)  PERIPHERAL JOINT MOTION (in degrees) ACTIVE RANGE OF MOTION (AROM) *Indicates pain 09/07/22 10/08/22 11/16/22 12/22/22 02/04/23  Joint/Motion R/L R/L R/L R/L R/L  Shoulder            Flexion 142/50* /70 /73 /78 /80  Extension 72/40* /48* /45 /50 /42  Abduction  155/55* /65* /69* /72 /75  External rotation 60/0 /0 /20 /15 /17  Internal rotation T8/Sacrum* /T12 /T12 /T12 /T12  Elbow            Flexion  134/130 /134 /140 /135    Extension  -6/-8 /-4 /-4 /-3    Comments:  09/07/2022: pulling/discomfort pain at left painful movements. Abduction most painful. Compensatory L shoulder hike with ABD>FLEX.  10/08/2022; 11/16/2022: abduction most painful 02/04/2023: measured prior to dry needling/manual/stretching   PASSIVE RANGE OF MOTION (PROM) *Indicates pain 09/07/22 10/08/22 11/16/22 12/22/22 02/04/23 before dry needling/stretch 02/04/23 After dry needling 02/04/23 After dry needling/stretch 02/24/23 After AAROM stretch  Joint/Motion R/L R/L R/L R/L R/L  R/L  R/L R/L  Shoulder                 Flexion /108* /130* /143* /150* /130* /150* /163* /160*  Extension / / / /         [name]  /68* /110* /130* /130* /120* /126* /135* /135*  External rotation /28* /18* /45* /15* /20* /45* /50* /50*  Internal rotation /60* /82* /55* /35 /60* /60* /65* /50*  Comments:  09/07/2022: flexion and abduction limited by pain/empty end feel with mild guarding. L shoulder ER/IR at 45 degrees scaption.  10/08/2022: L shoulder ER/IR at 90 degrees abduction 11/16/2022: ER/IR at 45 degrees  scaption. IR isolated to [facility] joint. Flexion oin scaption plane sidelying with inferior glide of GHJ.  12/22/2022: pain at  lateral shoulder. ER and IR measured at 90 degrees abduction.  02/24/23: ER/IR measured at 90 degrees abduction (flat on table).    MUSCLE PERFORMANCE (MMT):  *Indicates pain 11/16/22 12/22/22 02/24/23  Joint/Motion R/L R/L R/L  Shoulder        Flexion 5/3+* 5/4 5/4  Abduction (C5) 5/4 5/4+ 5/4+  External rotation 4+/2 4+/2 4+/2  Internal rotation 5/4 5/5 5/5  Extension / / /  Elbow        Flexion (C6) 5/4 5/4+ 5/4+  Extension (C7) 5/4 5/5 5/5  11/16/2022; 12/22/2022; 02/24/23: within available ROM (see AROM above).    Grip strength (in pounds, average of three measures).  09/07/2022:  R: (59+59+65)/3 = 61 L: (34+29+30)/ 3 = 31 10/08/2022:  R: (65+73+65)/3 = 67.7 L: (50+46+46)/ 3 = 47.3 11/16/2022:  R: (67+74+68)/3 = 69.7 L: (54+59+68)/3 = 60.3 12/22/2022:  R: (70+78+72)/3 = 73.3 L: (60+*56+65)/3 = 60.3  TODAY'S TREATMENT:   Therapeutic exercise: to centralize symptoms and improve ROM, strength, muscular endurance, and activity tolerance required for successful completion of functional activities.  - Upper body ergometer level 7 to encourage joint nutrition, warm tissue, induce analgesic effect of aerobic exercise, improve muscular strength and endurance,  and prepare for remainder of session. 5 minutes total changing directions every 1 minute.  - supine AAROM L shoulder flexion with PVC stick loaded with 10#, 3x10 with 5 second hold.  - measurements to assess progress at discharge (see above).  - lower trap setting with AAROM for L shoulder using BlueTB 3x10 with 5 second holds. (Needed more breaks today due to feeling generalized fatigue).  - seated L shoulder lateral raise from approximately  80 degrees abduction resting on a table, 3x10 with 5 second holds.   Pt required multimodal cuing for proper technique and to facilitate improved neuromuscular control,  strength, range of motion, and functional ability resulting in improved performance and form.   PATIENT EDUCATION: Education details: Exercise purpose/form. Self management techniques. Discharge recommendations.  Reviewed cancelation/no-show policy with patient and confirmed patient has correct phone number for clinic; patient verbalized understanding (10/01/22). Person educated: Patient Education method: Explanation, Demonstration, Tactile cues, Verbal cues, and Handouts Education comprehension: verbalized understanding, returned demonstration.    HOME EXERCISE PROGRAM: Access Code: [email or web address] URL: [email or web address] Date: 02/18/2023 Prepared by: Norton Blizzard  Exercises - Supine Shoulder Flexion with Dowel  - 1 x daily - 1 sets - 20 reps - 5 seconds hold - Shoulder Flexion Wall Slide with Towel  - 1 x daily - 20 reps - 5 second hold - Seated Shoulder Abduction AAROM with Pulley Behind  - 1 x daily - 1 sets - 20 reps - 5 seconds hold - Seated Shoulder Internal Rotation AAROM with Pulley (Mirrored)  - 1 x daily - 1 sets - 20 reps - 5 seconds hold - Shoulder ER Stretch in Abduction  - 1 x daily - 2-3 sets - 10 reps - 5-10 hold - Standing Single Arm Row with Resistance Thumb Up  - 3-5 x weekly - 3 sets - 10 reps - Shoulder External Rotation with Anchored Resistance  - 3-5 x weekly - 3 sets - 10 reps - Shoulder Internal Rotation with Resistance  - 3-5 x weekly - 3 sets - 10 reps - Sidelying Shoulder Abduction Palm Forward  - 3-5 x weekly - 3 sets - 10 reps - Sidelying Shoulder Horizontal Abduction  - 3-5 x weekly - 3 sets - 10 reps - Seated Single Arm Bicep Curls Supinated  with Dumbbell  - 3-5 x weekly - 3 sets - 10-15 reps - Standing Wall [employer] with Mini Swiss Ball  - 3-5 x weekly - 2 sets - 40 reps - Standing Wall [employer] in Scaption with Mini Swiss Ball  - 3-5 x weekly - 2 sets - 40 reps - Standing Low Trap Setting with Resistance at Wall  - 3-5 x weekly - 3 sets  - 10 reps    ASSESSMENT:   CLINICAL IMPRESSION:   Patient has attended 46 physical therapy sessions since starting current episode of care on 09/07/2022. Patient has attended faithfully and put forth good effort throughout his episode of care. He has made significant progress towards all goals but has been unable to achieve AROM and strength goals second to ongoing pattern of weakness that suggests lack of meaningful function of the supraspinatus and infraspinatus. He also continues to have GHJ stiffness that affects his ROM and strength. He is now being discharged to long term HEP for continued independent management due to lack of continued progress in these measures. His improvement is reasonable if not ideal given the nature and severity of his injury and is similar to what could be expected if he had undergone reverse total shoulder arthroscopy.    OBJECTIVE IMPAIRMENTS: decreased activity tolerance, decreased coordination, decreased endurance, decreased knowledge of condition, decreased mobility, decreased ROM, decreased strength, hypomobility, increased edema, increased fascial restrictions, impaired perceived functional ability, increased muscle spasms, impaired flexibility, impaired UE functional use, improper body mechanics, and pain.    ACTIVITY LIMITATIONS: carrying, lifting, sleeping, bathing, dressing, reach over head, hygiene/grooming, and caring for others   PARTICIPATION LIMITATIONS: meal prep, cleaning, interpersonal relationship, shopping, community activity, occupation, yard work, and   difficulty completing any activities that require use of left UE, showering, washing dishes, dressing, hygiene, working out at [employer] for health, sleeping, housework, cooking, throwing balls, some things he does left handed   PERSONAL FACTORS: Age, Past/current experiences, Time since onset of injury/illness/exacerbation, and 3+ comorbidities:   prostate cancer (dx Fall 2021, 40 radiation treatments  in summer 2022, now doing well followed 1x a year), HTN, hyperlipidemia, asthma (as a child), ORIF L tibial Plateau (08/03/2019), OA, appendectomy, skin cancer (removed) are also affecting patient's functional outcome.    REHAB POTENTIAL: Good   CLINICAL DECISION MAKING: Stable/uncomplicated   EVALUATION COMPLEXITY: Low     GOALS: Goals reviewed with patient? No   SHORT TERM GOALS: Target date: 09/21/2022   Patient will be independent with initial home exercise program for self-management of symptoms. Baseline: Initial HEP provided at IE (09/07/22); Goal status: MET     LONG TERM GOALS: Target date: 11/30/2022  Updated to 02/08/2023 for all unmet goals on 11/16/2022. Updated to 04/29/2023 for all unmet goals on 02/04/2023.   Patient will be independent with a long-term home exercise program for self-management of symptoms.  Baseline: Initial HEP provided at IE (09/07/22); excellent participation (10/08/2022); continues participation as able (11/16/2022; 12/22/2022); participating 2x a week (02/04/2023);  Goal status: MET   2.  Patient will demonstrate improved FOTO to equal or greater than 67 by visit #12 to demonstrate improvement in overall condition and self-reported functional ability.  Baseline: 44 (09/07/22); 61 at visit #8 (10/01/2022); 60 at visit #20 (11/16/2022); 74 at visit #30 (12/22/2022); 77 at visit #40 (02/04/2023); 83 at visit # 46 (02/24/2023); Goal status: Met but not by visit 12   3.  Patient will demonstrate L shoulder AROM equal or greater than  130 degrees flexion, 110 degrees abduction,  80 degrees ER at 90 degrees abduction, and IR to L1 to improve his ability to dress, reach overhead, and throw balls.  Baseline: very limited  - see objective (09/07/22); continues to be limited but improving - see objective (10/08/2022);  similar limitations but less painful  - see objective (11/16/2022); similar limitations but no longer painful - see objective (12/22/2022); similar limitations prior  to stretching and dry needling - PROM with significant improvements following needling and stretching - see objective (02/04/2023);  Goal status: partially met   4.  Patient will demonstrate L shoulder strength equal or greater than 4/5 MMT in flexion, abduction, ER, and IR to improve his ability to reach overhead, throw a ball, and dress. Baseline: testing deferred due to acuity of injury (09/07/22); resistive testing continues to be deferred but see AROM for strength (10/08/2022); lacks AROM but does have good resistance to challenge within available AROM except ER - see above (11/16/2022; 12/22/2022; 02/04/2023);  Goal status: partially met   5.  Patient will complete community, work and/or recreational activities with equal or less than 75% limitation due to current condition.  Baseline: difficulty completing any activities that require use of left UE, showering, washing dishes, dressing, hygiene, working out at [employer] for health, sleeping, housework, cooking, throwing balls, some things he does left handed (09/07/22); has noted improvement with ability to use left UE for activities that do not require overhead motion or heavy force such as putting his shirttail in, holding low on the handle of the elliptical machine, reaching for the car door, washing dishes, flossing; still cannot shampoo hair with left hand (10/08/2022); pt reports he notices more he can do every day (11/16/2022); patient reports he ocntinues to feel improvement daily (12/22/2022); patient continues to report improvements after every PT session (02/04/2023); estimates 80% improvement (02/24/2023);  Goal status: MET   PLAN:   PT FREQUENCY: 1-2x/week   PT DURATION: 12 weeks   PLANNED INTERVENTIONS: Therapeutic exercises, Therapeutic activity, Neuromuscular re-education, Balance training, Gait training, Patient/Family education, Self Care, Joint mobilization, Dry Needling, Electrical stimulation, Spinal mobilization, Cryotherapy, Moist  heat, Manual therapy, and Re-evaluation   PLAN FOR NEXT SESSION: Patient is now discharged from PT to long term HEP for independent management.    Cira Rue, PT, DPT 02/24/2023, 2:07 PM  [facility] [address] [place], [place] [address] P: [phone] I F: [phone]

## 2023-02-25 ENCOUNTER — Other Ambulatory Visit: Payer: PPO

## 2023-02-25 DIAGNOSIS — Z125 Encounter for screening for malignant neoplasm of prostate: Secondary | ICD-10-CM | POA: Diagnosis not present

## 2023-02-25 DIAGNOSIS — Z79899 Other long term (current) drug therapy: Secondary | ICD-10-CM | POA: Diagnosis not present

## 2023-02-25 DIAGNOSIS — E781 Pure hyperglyceridemia: Secondary | ICD-10-CM | POA: Diagnosis not present

## 2023-02-25 DIAGNOSIS — E7800 Pure hypercholesterolemia, unspecified: Secondary | ICD-10-CM | POA: Diagnosis not present

## 2023-03-03 ENCOUNTER — Other Ambulatory Visit: Payer: PPO

## 2023-03-04 ENCOUNTER — Ambulatory Visit
Admission: RE | Admit: 2023-03-04 | Discharge: 2023-03-04 | Disposition: A | Discharge status: Home / Self Care | Payer: PPO | Source: Ambulatory Visit | Attending: Radiation Oncology | Admitting: Radiation Oncology

## 2023-03-04 VITALS — BP 122/70 | HR 57 | Temp 98.3°F | Resp 16 | Ht 75.0 in | Wt 211.0 lb

## 2023-03-04 DIAGNOSIS — C61 Malignant neoplasm of prostate: Secondary | ICD-10-CM | POA: Insufficient documentation

## 2023-03-04 DIAGNOSIS — Z191 Hormone sensitive malignancy status: Secondary | ICD-10-CM | POA: Diagnosis not present

## 2023-03-04 DIAGNOSIS — Z923 Personal history of irradiation: Secondary | ICD-10-CM | POA: Insufficient documentation

## 2023-03-04 NOTE — Progress Notes (Signed)
Radiation Oncology Follow up Note  Name: Glenn Watson   Date:   03/04/2023 MRN:  [id number] DOB: [birth year]    This 77 y.o. male presents to the clinic today for 22-month follow-up status post image guided IMRT radiation therapy to his prostate for Gleason 7 (4+3).  Presented with a PSA in the 10 range.  REFERRING PROVIDER: Marguarite Arbour, MD  HPI: Patient is a 77 year old male now out 22 months having completed IMRT radiation therapy for Gleason 7 adenocarcinoma the prostate.  Seen today in routine follow-up he is doing fairly well.Marland Kitchen  He is currently in physical therapy for a four-part fracture of the surgical neck of his humerus he specifically denies any increased lower urinary tract symptoms or diarrhea.  His most recent PSA is 0.05  COMPLICATIONS OF TREATMENT: none  FOLLOW UP COMPLIANCE: keeps appointments   PHYSICAL EXAM:  BP 122/70   Pulse (!) 57   Temp 98.3 F (36.8 C)   Resp 16   Ht 6\' 3"  (1.905 m)   Wt 211 lb (95.7 kg)   BMI 26.37 kg/m  Frail-appearing male in NAD.  Well-developed well-nourished patient in NAD. HEENT reveals PERLA, EOMI, discs not visualized.  Oral cavity is clear. No oral mucosal lesions are identified. Neck is clear without evidence of cervical or supraclavicular adenopathy. Lungs are clear to A&P. Cardiac examination is essentially unremarkable with regular rate and rhythm without murmur rub or thrill. Abdomen is benign with no organomegaly or masses noted. Motor sensory and DTR levels are equal and symmetric in the upper and lower extremities. Cranial nerves II through XII are grossly intact. Proprioception is intact. No peripheral adenopathy or edema is identified. No motor or sensory levels are noted. Crude visual fields are within normal range.  RADIOLOGY RESULTS: No current films for review  PLAN: Present time patient is now out 22 months under excellent biochemical control of his prostate cancer.  Based on his overall general status I am going  to turn follow-up care over to urology.  Be happy to reevaluate him at any time should that be indicated.  Patient knows to call with concerns.  I would like to take this opportunity to thank you for allowing me to participate in the care of your patient.Carmina Miller, MD

## 2023-03-11 DIAGNOSIS — R7309 Other abnormal glucose: Secondary | ICD-10-CM | POA: Diagnosis not present

## 2023-03-11 DIAGNOSIS — Z125 Encounter for screening for malignant neoplasm of prostate: Secondary | ICD-10-CM | POA: Diagnosis not present

## 2023-03-11 DIAGNOSIS — D649 Anemia, unspecified: Secondary | ICD-10-CM | POA: Diagnosis not present

## 2023-03-11 DIAGNOSIS — E7800 Pure hypercholesterolemia, unspecified: Secondary | ICD-10-CM | POA: Diagnosis not present

## 2023-03-11 DIAGNOSIS — Z79899 Other long term (current) drug therapy: Secondary | ICD-10-CM | POA: Diagnosis not present

## 2023-03-11 DIAGNOSIS — I10 Essential (primary) hypertension: Secondary | ICD-10-CM | POA: Diagnosis not present

## 2023-03-11 DIAGNOSIS — Z0000 Encounter for general adult medical examination without abnormal findings: Secondary | ICD-10-CM | POA: Diagnosis not present

## 2023-03-24 DIAGNOSIS — Z85828 Personal history of other malignant neoplasm of skin: Secondary | ICD-10-CM | POA: Diagnosis not present

## 2023-03-24 DIAGNOSIS — L570 Actinic keratosis: Secondary | ICD-10-CM | POA: Diagnosis not present

## 2023-03-24 DIAGNOSIS — X32XXXA Exposure to sunlight, initial encounter: Secondary | ICD-10-CM | POA: Diagnosis not present

## 2023-03-24 DIAGNOSIS — L728 Other follicular cysts of the skin and subcutaneous tissue: Secondary | ICD-10-CM | POA: Diagnosis not present

## 2023-03-24 DIAGNOSIS — Z85820 Personal history of malignant melanoma of skin: Secondary | ICD-10-CM | POA: Diagnosis not present

## 2023-03-24 DIAGNOSIS — Z08 Encounter for follow-up examination after completed treatment for malignant neoplasm: Secondary | ICD-10-CM | POA: Diagnosis not present

## 2023-03-25 ENCOUNTER — Other Ambulatory Visit: Payer: Self-pay

## 2023-03-25 MED ORDER — FLUAD 0.5 ML IM SUSY
PREFILLED_SYRINGE | INTRAMUSCULAR | 0 refills | Status: AC
  Filled 2023-03-25: qty 0.5, 1d supply, fill #0

## 2023-03-25 MED ORDER — COMIRNATY 30 MCG/0.3ML IM SUSY
PREFILLED_SYRINGE | INTRAMUSCULAR | 0 refills | Status: AC
  Filled 2023-03-25: qty 0.3, 1d supply, fill #0

## 2023-03-25 MED ORDER — SHINGRIX 50 MCG/0.5ML IM SUSR
INTRAMUSCULAR | 0 refills | Status: DC
  Filled 2023-03-25: qty 0.5, 1d supply, fill #0

## 2023-04-12 DIAGNOSIS — H40003 Preglaucoma, unspecified, bilateral: Secondary | ICD-10-CM | POA: Diagnosis not present

## 2023-04-12 DIAGNOSIS — H2513 Age-related nuclear cataract, bilateral: Secondary | ICD-10-CM | POA: Diagnosis not present

## 2023-04-12 DIAGNOSIS — E119 Type 2 diabetes mellitus without complications: Secondary | ICD-10-CM | POA: Diagnosis not present

## 2023-06-01 ENCOUNTER — Other Ambulatory Visit: Payer: Self-pay

## 2023-06-01 MED ORDER — SHINGRIX 50 MCG/0.5ML IM SUSR
INTRAMUSCULAR | 0 refills | Status: AC
  Filled 2023-06-01: qty 0.5, 1d supply, fill #0

## 2023-06-24 ENCOUNTER — Ambulatory Visit: Payer: PPO | Admitting: Urology

## 2023-08-02 ENCOUNTER — Encounter: Payer: Self-pay | Admitting: Podiatry

## 2023-08-02 ENCOUNTER — Ambulatory Visit: Payer: PPO | Admitting: Podiatry

## 2023-08-02 VITALS — Ht 75.0 in | Wt 211.0 lb

## 2023-08-02 DIAGNOSIS — M79672 Pain in left foot: Secondary | ICD-10-CM

## 2023-08-02 DIAGNOSIS — L84 Corns and callosities: Secondary | ICD-10-CM

## 2023-08-02 NOTE — Progress Notes (Signed)
  Subjective:  Patient ID: Glenn Watson, male    DOB: Oct 14, 1945,  MRN: [id number]  Chief Complaint  Patient presents with   Callouses    Pt is here due to callous on his left foot, pt states it has been there for over a month, states it doesn't hurt like it did before, but still wants to get it checked out.    78 y.o. male presents with the above complaint. History confirmed with patient.  Starting to do better now that his shoulder is doing better  Objective:  Physical Exam: warm, good capillary refill, no trophic changes or ulcerative lesions, normal DP and PT pulses, normal sensory exam, and callus submetatarsal 5 left foot  Assessment:   1. Callus of foot   2. Pain in left foot      Plan:  Patient was evaluated and treated and all questions answered.   All symptomatic hyperkeratoses were safely debrided left foot submetatarsal 5 as a courtesy today with a sterile #15 blade to patient's level of comfort without incident. We discussed preventative and palliative care of these lesions including supportive and accommodative shoegear, padding, prefabricated and custom molded accommodative orthoses, use of a pumice stone and lotions/creams daily.   No follow-ups on file.

## 2023-08-27 DIAGNOSIS — D225 Melanocytic nevi of trunk: Secondary | ICD-10-CM | POA: Diagnosis not present

## 2023-08-27 DIAGNOSIS — D2261 Melanocytic nevi of right upper limb, including shoulder: Secondary | ICD-10-CM | POA: Diagnosis not present

## 2023-08-27 DIAGNOSIS — D2272 Melanocytic nevi of left lower limb, including hip: Secondary | ICD-10-CM | POA: Diagnosis not present

## 2023-08-27 DIAGNOSIS — Z85828 Personal history of other malignant neoplasm of skin: Secondary | ICD-10-CM | POA: Diagnosis not present

## 2023-08-27 DIAGNOSIS — D2262 Melanocytic nevi of left upper limb, including shoulder: Secondary | ICD-10-CM | POA: Diagnosis not present

## 2023-08-27 DIAGNOSIS — L538 Other specified erythematous conditions: Secondary | ICD-10-CM | POA: Diagnosis not present

## 2023-08-27 DIAGNOSIS — L570 Actinic keratosis: Secondary | ICD-10-CM | POA: Diagnosis not present

## 2023-08-27 DIAGNOSIS — D485 Neoplasm of uncertain behavior of skin: Secondary | ICD-10-CM | POA: Diagnosis not present

## 2023-08-27 DIAGNOSIS — Z85820 Personal history of malignant melanoma of skin: Secondary | ICD-10-CM | POA: Diagnosis not present

## 2023-09-08 DIAGNOSIS — Z79899 Other long term (current) drug therapy: Secondary | ICD-10-CM | POA: Diagnosis not present

## 2023-09-08 DIAGNOSIS — Z125 Encounter for screening for malignant neoplasm of prostate: Secondary | ICD-10-CM | POA: Diagnosis not present

## 2023-09-08 DIAGNOSIS — R7309 Other abnormal glucose: Secondary | ICD-10-CM | POA: Diagnosis not present

## 2023-09-08 DIAGNOSIS — I10 Essential (primary) hypertension: Secondary | ICD-10-CM | POA: Diagnosis not present

## 2023-09-08 DIAGNOSIS — E7800 Pure hypercholesterolemia, unspecified: Secondary | ICD-10-CM | POA: Diagnosis not present

## 2023-09-09 ENCOUNTER — Ambulatory Visit (INDEPENDENT_AMBULATORY_CARE_PROVIDER_SITE_OTHER): Payer: Self-pay | Admitting: Urology

## 2023-09-09 VITALS — BP 130/68 | HR 67 | Ht 75.0 in | Wt 205.0 lb

## 2023-09-09 DIAGNOSIS — C61 Malignant neoplasm of prostate: Secondary | ICD-10-CM

## 2023-09-09 DIAGNOSIS — Z8546 Personal history of malignant neoplasm of prostate: Secondary | ICD-10-CM | POA: Diagnosis not present

## 2023-09-09 DIAGNOSIS — R399 Unspecified symptoms and signs involving the genitourinary system: Secondary | ICD-10-CM | POA: Diagnosis not present

## 2023-09-09 DIAGNOSIS — N529 Male erectile dysfunction, unspecified: Secondary | ICD-10-CM | POA: Diagnosis not present

## 2023-09-09 LAB — BLADDER SCAN AMB NON-IMAGING

## 2023-09-09 NOTE — Progress Notes (Signed)
   09/09/2023 9:11 AM   Roda Shutters 12/31/1945 [id number]  Reason for visit: Follow up prostate cancer, ED, urinary symptoms  HPI: 78 year old very healthy male who had an elevated PSA of 9.9 and prostate biopsy in April 2022 showed a 40 g prostate with 4/12 cores positive for unfavorable intermediate risk prostate cancer with max core involvement of 92% with significant cribriform features.He underwent 41-month ADT and gold seed marker placement on 11/27/2020, followed by IMRT with Dr. Rushie Chestnut with radiation oncology.  Initial PSA was undetectable.  PSA remains undetectable, most recently on 09/08/2023 (0.08).    He has done very well since treatment, and denies any significant urinary symptoms at this time or gross hematuria.  PVR normal today at 27ml.  No longer taking Flomax and denies any urinary symptoms.  He has had ED refractory to Cialis, not interested in continuing Cialis at this time, but will contact us if he changes his mind.  Could also trial Viagra in the future if he desires.  Okay to use Cialis either daily or as needed for ED, with dose 5 to 20 mg if interested in the future RTC with urology 1 year PSA prior   Sondra Come, MD   [facility] [address] [place], [place] [address] [phone]

## 2023-10-21 DIAGNOSIS — R519 Headache, unspecified: Secondary | ICD-10-CM | POA: Diagnosis not present

## 2024-02-11 DIAGNOSIS — E781 Pure hyperglyceridemia: Secondary | ICD-10-CM | POA: Diagnosis not present

## 2024-02-11 DIAGNOSIS — E7800 Pure hypercholesterolemia, unspecified: Secondary | ICD-10-CM | POA: Diagnosis not present

## 2024-02-11 DIAGNOSIS — Z125 Encounter for screening for malignant neoplasm of prostate: Secondary | ICD-10-CM | POA: Diagnosis not present

## 2024-02-11 DIAGNOSIS — Z79899 Other long term (current) drug therapy: Secondary | ICD-10-CM | POA: Diagnosis not present

## 2024-02-11 DIAGNOSIS — R7309 Other abnormal glucose: Secondary | ICD-10-CM | POA: Diagnosis not present

## 2024-02-14 ENCOUNTER — Other Ambulatory Visit: Payer: Self-pay | Admitting: *Deleted

## 2024-02-14 DIAGNOSIS — C61 Malignant neoplasm of prostate: Secondary | ICD-10-CM

## 2024-02-15 ENCOUNTER — Encounter: Payer: Self-pay | Admitting: Internal Medicine

## 2024-02-23 ENCOUNTER — Encounter: Payer: Self-pay | Admitting: Urology

## 2024-02-24 ENCOUNTER — Other Ambulatory Visit: Payer: Self-pay

## 2024-03-02 ENCOUNTER — Encounter: Payer: Self-pay | Admitting: Radiation Oncology

## 2024-03-02 ENCOUNTER — Ambulatory Visit
Admission: RE | Admit: 2024-03-02 | Discharge: 2024-03-02 | Disposition: A | Discharge status: Home / Self Care | Payer: Self-pay | Source: Ambulatory Visit | Attending: Radiation Oncology | Admitting: Radiation Oncology

## 2024-03-02 VITALS — BP 111/68 | HR 52 | Temp 98.0°F | Resp 16 | Wt 210.0 lb

## 2024-03-02 DIAGNOSIS — Z191 Hormone sensitive malignancy status: Secondary | ICD-10-CM | POA: Diagnosis not present

## 2024-03-02 DIAGNOSIS — Z923 Personal history of irradiation: Secondary | ICD-10-CM | POA: Diagnosis not present

## 2024-03-02 DIAGNOSIS — C61 Malignant neoplasm of prostate: Secondary | ICD-10-CM | POA: Diagnosis not present

## 2024-03-02 NOTE — Progress Notes (Signed)
 Radiation Oncology Follow up Note  Name: Glenn Watson   Date:   03/02/2024 MRN:  [id number] DOB: 09/16/1945    This 78 y.o. male presents to the clinic today for 3-year follow-up status post image guided IMRT radiation therapy to his prostate for Gleason 7 (4+3) adenocarcinoma presenting with a PSA in the 10 range  REFERRING PROVIDER: Auston Reyes JONETTA, MD  HPI: Patient is a 78 year old male now out over 3 years having completed image guided IMRT radiation therapy for Gleason 7 adenocarcinoma.  Seen today in routine follow-up he is doing well.  He specifically denies any increased lower Neri tract symptoms diarrhea or fatigue.  His PSA remains extremely low at 0.06 stable over time..  COMPLICATIONS OF TREATMENT: none  FOLLOW UP COMPLIANCE: keeps appointments   PHYSICAL EXAM:  BP 111/68   Pulse (!) 52   Temp 98 F (36.7 C) (Tympanic)   Resp 16   Wt 210 lb (95.3 kg)   BMI 26.25 kg/m  Well-developed well-nourished patient in NAD. HEENT reveals PERLA, EOMI, discs not visualized.  Oral cavity is clear. No oral mucosal lesions are identified. Neck is clear without evidence of cervical or supraclavicular adenopathy. Lungs are clear to A&P. Cardiac examination is essentially unremarkable with regular rate and rhythm without murmur rub or thrill. Abdomen is benign with no organomegaly or masses noted. Motor sensory and DTR levels are equal and symmetric in the upper and lower extremities. Cranial nerves II through XII are grossly intact. Proprioception is intact. No peripheral adenopathy or edema is identified. No motor or sensory levels are noted. Crude visual fields are within normal range.  RADIOLOGY RESULTS: No current films to review  PLAN: Present time patient is doing well under excellent biochemical control of his prostate cancer.  I am pleased with his overall progress.  At this time he is now out over 3 years I will ask urology to continue follow-up care.  I be happy to reevaluate  patient anytime should that be indicated.  I would like to take this opportunity to thank you for allowing me to participate in the care of your patient.SABRA Marcey Penton, MD

## 2024-03-10 ENCOUNTER — Other Ambulatory Visit: Payer: Self-pay

## 2024-03-10 MED ORDER — COMIRNATY 30 MCG/0.3ML IM SUSY: PREFILLED_SYRINGE | INTRAMUSCULAR | 0 refills | Status: AC

## 2024-03-16 DIAGNOSIS — Z1331 Encounter for screening for depression: Secondary | ICD-10-CM | POA: Diagnosis not present

## 2024-03-16 DIAGNOSIS — E782 Mixed hyperlipidemia: Secondary | ICD-10-CM | POA: Diagnosis not present

## 2024-03-16 DIAGNOSIS — Z0000 Encounter for general adult medical examination without abnormal findings: Secondary | ICD-10-CM | POA: Diagnosis not present

## 2024-03-16 DIAGNOSIS — Z79899 Other long term (current) drug therapy: Secondary | ICD-10-CM | POA: Diagnosis not present

## 2024-03-16 DIAGNOSIS — D649 Anemia, unspecified: Secondary | ICD-10-CM | POA: Diagnosis not present

## 2024-03-16 DIAGNOSIS — I10 Essential (primary) hypertension: Secondary | ICD-10-CM | POA: Diagnosis not present

## 2024-03-21 ENCOUNTER — Other Ambulatory Visit: Payer: Self-pay

## 2024-03-21 MED ORDER — FLUZONE HIGH-DOSE 0.5 ML IM SUSY
0.5000 mL | PREFILLED_SYRINGE | Freq: Once | INTRAMUSCULAR | 0 refills | Status: AC
  Filled 2024-03-21: qty 0.5, 1d supply, fill #0

## 2024-03-21 MED ORDER — BOOSTRIX 5-2.5-18.5 LF-MCG/0.5 IM SUSY
PREFILLED_SYRINGE | INTRAMUSCULAR | 0 refills | Status: AC
  Filled 2024-03-21: qty 0.5, 1d supply, fill #0

## 2024-03-21 MED ORDER — COMIRNATY 30 MCG/0.3ML IM SUSY
0.3000 mL | PREFILLED_SYRINGE | Freq: Once | INTRAMUSCULAR | 0 refills | Status: AC
  Filled 2024-03-21: qty 0.3, 1d supply, fill #0

## 2024-04-03 ENCOUNTER — Other Ambulatory Visit: Payer: Self-pay

## 2024-04-04 ENCOUNTER — Other Ambulatory Visit: Payer: Self-pay

## 2024-09-01 ENCOUNTER — Other Ambulatory Visit

## 2024-09-06 ENCOUNTER — Ambulatory Visit: Admitting: Urology

## 2024-09-07 ENCOUNTER — Ambulatory Visit: Admitting: Urology
